# Patient Record
Sex: Female | Born: 1975 | Race: White | Hispanic: No | Marital: Single | Smoking: Former smoker
Health system: Southern US, Community
[De-identification: ages and names within clinical notes are randomized; demographics above are authoritative.]

## PROBLEM LIST (undated history)

## (undated) ENCOUNTER — Emergency Department: Admission: EM | Discharge status: Absent without leave | Payer: Self-pay

## (undated) DIAGNOSIS — G4710 Hypersomnia, unspecified: Secondary | ICD-10-CM

## (undated) DIAGNOSIS — G4730 Sleep apnea, unspecified: Secondary | ICD-10-CM

## (undated) DIAGNOSIS — K580 Irritable bowel syndrome with diarrhea: Secondary | ICD-10-CM

## (undated) DIAGNOSIS — G47419 Narcolepsy without cataplexy: Secondary | ICD-10-CM

## (undated) DIAGNOSIS — E119 Type 2 diabetes mellitus without complications: Secondary | ICD-10-CM

## (undated) DIAGNOSIS — T424X1A Poisoning by benzodiazepines, accidental (unintentional), initial encounter: Secondary | ICD-10-CM

## (undated) DIAGNOSIS — D134 Benign neoplasm of liver: Secondary | ICD-10-CM

## (undated) DIAGNOSIS — K760 Fatty (change of) liver, not elsewhere classified: Secondary | ICD-10-CM

## (undated) DIAGNOSIS — R002 Palpitations: Secondary | ICD-10-CM

## (undated) DIAGNOSIS — R000 Tachycardia, unspecified: Secondary | ICD-10-CM

## (undated) DIAGNOSIS — N3010 Interstitial cystitis (chronic) without hematuria: Secondary | ICD-10-CM

## (undated) DIAGNOSIS — I10 Essential (primary) hypertension: Secondary | ICD-10-CM

## (undated) DIAGNOSIS — R011 Cardiac murmur, unspecified: Secondary | ICD-10-CM

## (undated) DIAGNOSIS — F32A Depression, unspecified: Secondary | ICD-10-CM

## (undated) DIAGNOSIS — F329 Major depressive disorder, single episode, unspecified: Secondary | ICD-10-CM

## (undated) DIAGNOSIS — F419 Anxiety disorder, unspecified: Secondary | ICD-10-CM

## (undated) DIAGNOSIS — G47411 Narcolepsy with cataplexy: Secondary | ICD-10-CM

## (undated) DIAGNOSIS — G43909 Migraine, unspecified, not intractable, without status migrainosus: Secondary | ICD-10-CM

## (undated) DIAGNOSIS — E7800 Pure hypercholesterolemia, unspecified: Secondary | ICD-10-CM

## (undated) HISTORY — DX: Palpitations: R00.2

## (undated) HISTORY — PX: ADENOIDECTOMY: SUR15

## (undated) HISTORY — DX: Type 2 diabetes mellitus without complications: E11.9

## (undated) HISTORY — DX: Benign neoplasm of liver: D13.4

## (undated) HISTORY — DX: Cardiac murmur, unspecified: R01.1

## (undated) HISTORY — PX: BLADDER SURGERY: SHX569

## (undated) HISTORY — DX: Interstitial cystitis (chronic) without hematuria: N30.10

## (undated) HISTORY — DX: Fatty (change of) liver, not elsewhere classified: K76.0

## (undated) HISTORY — DX: Hypersomnia, unspecified: G47.10

## (undated) HISTORY — PX: KNEE SURGERY: SHX244

## (undated) HISTORY — DX: Depression, unspecified: F32.A

## (undated) HISTORY — DX: Narcolepsy without cataplexy: G47.419

## (undated) HISTORY — DX: Poisoning by benzodiazepines, accidental (unintentional), initial encounter: T42.4X1A

## (undated) HISTORY — PX: CHOLECYSTECTOMY: SHX55

## (undated) HISTORY — DX: Major depressive disorder, single episode, unspecified: F32.9

## (undated) HISTORY — DX: Irritable bowel syndrome with diarrhea: K58.0

## (undated) HISTORY — DX: Essential (primary) hypertension: I10

## (undated) HISTORY — DX: Migraine, unspecified, not intractable, without status migrainosus: G43.909

## (undated) HISTORY — DX: Pure hypercholesterolemia, unspecified: E78.00

## (undated) HISTORY — PX: TONSILLECTOMY: SUR1361

## (undated) HISTORY — DX: Tachycardia, unspecified: R00.0

## (undated) HISTORY — DX: Sleep apnea, unspecified: G47.30

## (undated) HISTORY — DX: Narcolepsy with cataplexy: G47.411

## (undated) HISTORY — DX: Anxiety disorder, unspecified: F41.9

---

## 2000-08-04 ENCOUNTER — Encounter: Payer: Self-pay | Admitting: Internal Medicine

## 2000-08-29 ENCOUNTER — Encounter: Payer: Self-pay | Admitting: Internal Medicine

## 2001-05-31 ENCOUNTER — Encounter: Payer: Self-pay | Admitting: Internal Medicine

## 2002-06-26 ENCOUNTER — Ambulatory Visit (HOSPITAL_COMMUNITY): Admission: RE | Admit: 2002-06-26 | Discharge: 2002-06-26 | Payer: Self-pay | Admitting: Obstetrics and Gynecology

## 2002-08-02 ENCOUNTER — Other Ambulatory Visit: Admission: RE | Admit: 2002-08-02 | Discharge: 2002-08-02 | Payer: Self-pay | Admitting: Obstetrics and Gynecology

## 2002-08-06 ENCOUNTER — Encounter: Admission: RE | Admit: 2002-08-06 | Discharge: 2002-08-06 | Payer: Self-pay | Admitting: Obstetrics and Gynecology

## 2002-08-06 ENCOUNTER — Encounter: Payer: Self-pay | Admitting: Obstetrics and Gynecology

## 2002-12-28 ENCOUNTER — Other Ambulatory Visit: Admission: RE | Admit: 2002-12-28 | Discharge: 2002-12-28 | Payer: Self-pay | Admitting: Obstetrics and Gynecology

## 2003-08-09 ENCOUNTER — Other Ambulatory Visit: Admission: RE | Admit: 2003-08-09 | Discharge: 2003-08-09 | Payer: Self-pay | Admitting: Obstetrics and Gynecology

## 2003-11-15 ENCOUNTER — Other Ambulatory Visit: Admission: RE | Admit: 2003-11-15 | Discharge: 2003-11-15 | Payer: Self-pay | Admitting: Obstetrics and Gynecology

## 2004-01-10 ENCOUNTER — Ambulatory Visit (HOSPITAL_BASED_OUTPATIENT_CLINIC_OR_DEPARTMENT_OTHER): Admission: RE | Admit: 2004-01-10 | Discharge: 2004-01-10 | Payer: Self-pay | Admitting: Urology

## 2004-01-10 ENCOUNTER — Encounter (INDEPENDENT_AMBULATORY_CARE_PROVIDER_SITE_OTHER): Payer: Self-pay | Admitting: Specialist

## 2004-01-10 ENCOUNTER — Ambulatory Visit (HOSPITAL_COMMUNITY): Admission: RE | Admit: 2004-01-10 | Discharge: 2004-01-10 | Payer: Self-pay | Admitting: Urology

## 2004-05-15 ENCOUNTER — Encounter: Payer: Self-pay | Admitting: Internal Medicine

## 2004-06-13 ENCOUNTER — Encounter: Payer: Self-pay | Admitting: Internal Medicine

## 2004-07-03 ENCOUNTER — Encounter: Payer: Self-pay | Admitting: Internal Medicine

## 2004-07-17 ENCOUNTER — Encounter: Admission: RE | Admit: 2004-07-17 | Discharge: 2004-07-17 | Payer: Self-pay | Admitting: *Deleted

## 2004-08-13 ENCOUNTER — Other Ambulatory Visit: Admission: RE | Admit: 2004-08-13 | Discharge: 2004-08-13 | Payer: Self-pay | Admitting: *Deleted

## 2005-02-08 ENCOUNTER — Ambulatory Visit: Payer: Self-pay | Admitting: General Surgery

## 2005-08-25 ENCOUNTER — Encounter: Payer: Self-pay | Admitting: *Deleted

## 2005-11-15 ENCOUNTER — Other Ambulatory Visit: Admission: RE | Admit: 2005-11-15 | Discharge: 2005-11-15 | Payer: Self-pay | Admitting: Obstetrics & Gynecology

## 2006-11-21 ENCOUNTER — Other Ambulatory Visit: Admission: RE | Admit: 2006-11-21 | Discharge: 2006-11-21 | Payer: Self-pay | Admitting: Obstetrics & Gynecology

## 2008-10-24 ENCOUNTER — Encounter: Admission: RE | Admit: 2008-10-24 | Discharge: 2008-10-24 | Payer: Self-pay | Admitting: Obstetrics and Gynecology

## 2008-12-02 ENCOUNTER — Encounter: Payer: Self-pay | Admitting: Nurse Practitioner

## 2008-12-02 ENCOUNTER — Inpatient Hospital Stay (HOSPITAL_COMMUNITY): Admission: RE | Admit: 2008-12-02 | Discharge: 2008-12-11 | Payer: Self-pay | Admitting: Internal Medicine

## 2008-12-04 ENCOUNTER — Encounter (HOSPITAL_BASED_OUTPATIENT_CLINIC_OR_DEPARTMENT_OTHER): Payer: Self-pay | Admitting: Internal Medicine

## 2008-12-06 ENCOUNTER — Ambulatory Visit: Payer: Self-pay | Admitting: Internal Medicine

## 2008-12-09 ENCOUNTER — Encounter (INDEPENDENT_AMBULATORY_CARE_PROVIDER_SITE_OTHER): Payer: Self-pay | Admitting: Interventional Radiology

## 2008-12-16 ENCOUNTER — Encounter: Admission: RE | Admit: 2008-12-16 | Discharge: 2009-03-16 | Payer: Self-pay | Admitting: Internal Medicine

## 2008-12-17 ENCOUNTER — Encounter: Payer: Self-pay | Admitting: Nurse Practitioner

## 2009-01-01 ENCOUNTER — Encounter: Payer: Self-pay | Admitting: Nurse Practitioner

## 2009-01-17 ENCOUNTER — Encounter: Payer: Self-pay | Admitting: Nurse Practitioner

## 2009-01-17 ENCOUNTER — Telehealth: Payer: Self-pay | Admitting: Gastroenterology

## 2009-01-21 ENCOUNTER — Ambulatory Visit: Payer: Self-pay | Admitting: Gastroenterology

## 2009-01-21 DIAGNOSIS — K219 Gastro-esophageal reflux disease without esophagitis: Secondary | ICD-10-CM | POA: Insufficient documentation

## 2009-01-21 DIAGNOSIS — Z8719 Personal history of other diseases of the digestive system: Secondary | ICD-10-CM | POA: Insufficient documentation

## 2009-01-21 DIAGNOSIS — R197 Diarrhea, unspecified: Secondary | ICD-10-CM | POA: Insufficient documentation

## 2009-02-19 ENCOUNTER — Ambulatory Visit: Payer: Self-pay | Admitting: Internal Medicine

## 2009-03-24 ENCOUNTER — Ambulatory Visit (HOSPITAL_COMMUNITY): Admission: RE | Admit: 2009-03-24 | Discharge: 2009-03-24 | Payer: Self-pay | Admitting: Internal Medicine

## 2010-10-08 ENCOUNTER — Encounter: Payer: Self-pay | Admitting: Nurse Practitioner

## 2010-10-27 ENCOUNTER — Encounter: Payer: Self-pay | Admitting: Nurse Practitioner

## 2010-11-03 NOTE — Letter (Signed)
Summary: phone msgs 3/1-4/16/[facility]  phone msgs 3/1-4/16/[facility]   Imported By: Lester [employer] 03/11/2009 07:54:12  _____________________________________________________________________  External Attachment:    Type:   Image     Comment:   External Document

## 2010-11-03 NOTE — Procedures (Signed)
Summary: [facility]oscopy/[facility]  [facility]oscopy/[facility]   Imported By: Lester [place] 02/28/2009 10:29:29  _____________________________________________________________________  External Attachment:    Type:   Image     Comment:   External Document

## 2010-11-03 NOTE — Assessment & Plan Note (Signed)
Summary: Diarrea for ver a month/lk   History of Present Illness Visit Type: Initial Consult Primary GI MD: Yancey Flemings MD Primary Provider: Buren Kos, MD Chief Complaint: diarrhea History of Present Illness:   Christine Cook was seen by [place] for elevated LFTs / abnormal CTscan in the hospital. She is here today with numerous medical complaints (see ROS).   Acute on Chronic Diarrhea:  Hospitalized early March for ten days with elevated WBC, diarrhea, fever, dehydration, UTI. According to admitting H&P patient had been on multiple antibiotics for rhinitis when her chronic diarrhea worsened. Stool for c-diff x3 negative in hospital as were stools for giardia, stool culture and stool lactoferrin. TTG negative. Patient took 10 days of Flagyl in hospital which didn't really help. Still having 4-6 loose BMs a day. BMs associated with LLQ cramps, patient not sure if they are resolved with defecation. Takes Lomotil if she exceeds more than 3-4 loose stools a day. Occasional nocturnal diarrhea. Small, low-fat meals are better tolerated. Had a colonoscopy for evaluation of pain in [place] approx 4 years ago. Though colonoscopy wasn't for diarrhea patient tells me colon biopsies were done and "okay". No history of hematochezia. Patient has gained 10 pounds since hospital discharge. TSH in hospital was normal.  GERD: Manifested as chest pain and for most part controlled with PPI.  GI in [place] did EGD few years ago for heartburn. Patient doesn't think she had a stricture but esophagus was dilated. EGD report not here for review at this time.       GI Review of Systems    Reports abdominal pain and  weight gain.     Location of  Abdominal pain: LLQ.    Denies chest pain.      Reports diarrhea, irritable bowel syndrome, and  liver problems.     Preventive Screening-Counseling & Management     Smoking Status: quit      Drug Use:  no.      Current Medications (verified):  1)  Effexor Xr 75 Mg Xr24h-Cap (Venlafaxine Hcl) .... 3 Tablets By Mouth Once Daily 2)  Protonix 40 Mg Tbec (Pantoprazole Sodium) .Marland Kitchen.. 1 Tablet By Mouth Once Daily 3)  Lomotil 2.5-0.025 Mg Tabs (Diphenoxylate-Atropine) .... Take By Mouth As Needed Diarrhea 4)  Atenolol 50 Mg Tabs (Atenolol) .Marland Kitchen.. 1 Tablet By Mouth Once Daily 5)  Benadryl Dye-Free Allergy 25 Mg Caps (Diphenhydramine Hcl) .Marland Kitchen.. 1 Tablet By Mouth Once Daily As Needed 6)  Advil 200 Mg Tabs (Ibuprofen) .... Take By Mouth As Needed  Allergies (verified): 1)  ! Morphine 2)  ! Codeine  Past History:  Past Medical History:    Current Problems:     SLEEP APNEA (ICD-780.57)    OBESITY (ICD-278.00)    IBS (ICD-564.1)    INTERSTITIAL CYSTITIS (ICD-595.1)    HYPERCHOLESTEROLEMIA (ICD-272.0)    GERD (ICD-530.81)    CHOLELITHIASIS, HX OF (ICD-V12.79)    DM (ICD-250.00)    DEPRESSION (ICD-311)    HEADACHE, CHRONIC (ICD-784.0)    ANXIETY (ICD-300.00)    ?FOCAL NODULAR HYPERPLASIA (LIVER BX. MARCH 2010)  Past Surgical History:    lt. knee    T&A    Cholecystectomy  Family History:    Limited FMH. Mother adopted. Doesn't know father.    No FH of Colon Cancer:    No history of Crohn's or Ulcerative Colitis  Social History:    Occupation: Admin Support    Patient is a former smoker.     Alcohol Use - yes  Alcohol Use - no    Illicit Drug Use - no    Smoking Status:  quit    Drug Use:  no  Review of Systems        The patient complains of allergy/sinus, breast changes/lumps, fatigue, fever, headaches-new, heart rhythm changes, menstrual pain, night sweats, nosebleeds, and sore throat.  The patient denies anemia, anxiety-new, arthritis/joint pain, back pain, blood in urine, change in vision, confusion, cough, coughing up blood, depression-new, fainting, hearing problems, heart murmur, itching, muscle pains/cramps, pregnancy symptoms, shortness of breath, skin rash, sleeping problems, swelling of feet/legs, swollen lymph glands, thirst - excessive , urination - excessive , urination changes/pain, urine leakage, vision changes, and voice change.    Vital Signs:  Patient profile:   35 year old female Height:      65 inches Weight:      189.25 pounds BMI:     31.61 Pulse rate:   88 / minute Pulse rhythm:   regular BP sitting:   128 / 86  (left arm)  Vitals Entered By: Milford Cage CMA (January 21, 2009 9:50 AM)  Physical Exam  General:  Well developed, well nourished, no acute distress. Head:  Normocephalic and atraumatic. Eyes:  Conjunctiva pink, no icterus.  Mouth:  No oral lesions. Tongue moist.  Neck:  Supple; no masses. Lungs:  Clear throughout to auscultation. Heart:  Regular rate and rhythm; no murmurs, rubs,  or bruits. Abdomen:  Abdomen soft, obese, nontender, nondistended. No obvious masses or hepatomegaly. No obvious hernias. Normal bowel sounds.  Msk:  Symmetrical with no gross deformities. Normal posture. Extremities:  No palmar erythema, no edema.  Neurologic:  Alert and  oriented x4;  grossly normal neurologically. Skin:  Intact without significant lesions or rashes. Cervical Nodes:  No significant cervical adenopathy. Psych:  Alert and cooperative. Flat affect.   Impression & Recommendations:  Problem # 1:  DIARRHEA (ICD-787.91) Assessment Deteriorated  Associated with some lower abdominal cramps, ? relieved with defecation. Told in past had IBS but patient not convinced. She has chronic loose stools, progressive over last several weeks. Recent stool studies, TTG negative. No response to Flagyl. See HPI. Need to obtain colonoscopy and EGD reports from Dr. Tressie Ellis in [place]. For now take Lomotil twice daily instead of taking on an as needed basis. High fiber diet. Cannot take bowel antispasmotics as it worsens her interstitial cystitis. Patient will follow up with Dr. Marina Goodell in 4-6 weeks.  Problem # 2:  IBS (ICD-564.1) Assessment: Unchanged See #1. Diarrhea likely functional in nature. If our workup turns out negative and patient doesn't respond to fiber, Lomotil and other IBS measures, we may look at trying Lotronex in the near future.  Problem # 3:  GERD (ICD-530.81) Assessment: Unchanged Continue PPI. Await EGD report from Dr. Tressie Ellis.  Problem # 4:  INTERSTITIAL CYSTITIS (ICD-595.1) Assessment: Comment Only Used to take Elmiron, self cath, etc.. but apparently measures were not effective.  Problem # 5:  DEPRESSION (ICD-311) Assessment: Comment Only On Effexor.  Problem # 6:  TRANSAMINASES, SERUM, ELEVATED (ICD-790.4) May be secondary to focal nodular hyperplasia but more likely related to CTscan findings of fatty liver disease. We did discuss managment of fatty liver disease including weight loss and blood sugar control. Further workup per Dr. Marina Goodell.  Problem # 7:  DM (ICD-250.00) Assessment: Comment Only Suppose to be starting oral agent soon. Improving glycemic control should help with steatosis.  Patient Instructions: 1)  High Fiber, Low Fat  Healthy Eating Plan brochure  given.  2)  We are giving you a written signed prescription for you to take to your pharmacy. 3)  Change your protonix dosage to take it 30 min prior to breakfast. 4)  We have made you an appointment to see Dr Yancey Flemings in the office on 02-19-09 at 8:30 AM.  5)  Copy sent to : Buren Kos, MD 6)  The medication list was reviewed and reconciled.  All changed / newly prescribed medications were explained.  A complete medication list was provided to the patient / caregiver. Prescriptions: LOMOTIL 2.5-0.025 MG TABS (DIPHENOXYLATE-ATROPINE) take by mouth as needed diarrhea  #60 x 1   Entered by:   Lowry Ram CMA   Authorized by:   Willette Cluster NP   Signed by:   Lowry Ram CMA on 01/21/2009   Method used:   Printed then faxed to ...         RxID:   [id number]

## 2010-11-03 NOTE — Letter (Signed)
Summary: [facility]   Imported By: Lester [place] 02/28/2009 09:50:31  _____________________________________________________________________  External Attachment:    Type:   Image     Comment:   External Document

## 2010-11-03 NOTE — Letter (Signed)
Summary: [facility]  [facility]   Imported By: Lester [place] 03/11/2009 08:01:43  _____________________________________________________________________  External Attachment:    Type:   Image     Comment:   External Document

## 2010-11-03 NOTE — Procedures (Signed)
Summary: [facility]  [facility]   Imported By: Lester [place] 02/28/2009 10:19:48  _____________________________________________________________________  External Attachment:    Type:   Image     Comment:   External Document

## 2010-11-03 NOTE — Procedures (Signed)
Summary: [facility]   Imported By: Lester [place] 02/28/2009 10:06:13  _____________________________________________________________________  External Attachment:    Type:   Image     Comment:   External Document

## 2010-11-13 ENCOUNTER — Telehealth: Payer: Self-pay | Admitting: Internal Medicine

## 2010-11-17 ENCOUNTER — Encounter: Payer: Self-pay | Admitting: Nurse Practitioner

## 2010-11-17 ENCOUNTER — Ambulatory Visit (INDEPENDENT_AMBULATORY_CARE_PROVIDER_SITE_OTHER): Payer: BC Managed Care – PPO | Admitting: Nurse Practitioner

## 2010-11-17 DIAGNOSIS — K625 Hemorrhage of anus and rectum: Secondary | ICD-10-CM

## 2010-11-17 DIAGNOSIS — K648 Other hemorrhoids: Secondary | ICD-10-CM

## 2010-11-17 DIAGNOSIS — K589 Irritable bowel syndrome without diarrhea: Secondary | ICD-10-CM

## 2010-11-17 DIAGNOSIS — K644 Residual hemorrhoidal skin tags: Secondary | ICD-10-CM

## 2010-11-19 NOTE — Progress Notes (Signed)
Summary: Triage:  Constipation and Hemorrhoids  Phone Note Call from Patient Call back at Home Phone [phone]   Caller: Patient Call For: Dr. Marina Goodell Reason for Call: Talk to Nurse Summary of Call: Pt needs to see doctor Cadon Raczka for very severe constipation and hemorrohoids Initial call taken by: [place] Johnson,  November 13, 2010 12:18 PM  Follow-up for Phone Call        Spoke with patient and she states she would like to be seen, she is having problems with constipation and hemorrhoids. She has tried some OTC medications for constipation but has had little success. Offered an appointment with Willette Cluster, RNP for 11/17/10 at 2pm. Patient states this appointment will work well for her, she also has an appointment with her GYN that morning because when she does have a bowel movement she is also having some vaginal bleeding. Patient aware of appointment date and time. Follow-up by: Selinda Michaels RN,  November 13, 2010 1:28 PM  Additional Follow-up for Phone Call Additional follow up Details #1::        ok Additional Follow-up by: Hilarie Fredrickson MD,  November 13, 2010 1:38 PM

## 2010-11-25 NOTE — Assessment & Plan Note (Addendum)
Summary: Constipation/Hemorrhoids/No show co-pay/[facility]   Vital Signs:  Patient profile:   35 year old female Height:      65 inches Weight:      165 pounds BMI:     27.56 Pulse rate:   68 / minute Pulse rhythm:   regular BP sitting:   100 / 60  (left arm)  Vitals Entered By: Chales Abrahams CMA Duncan Dull) (November 17, 2010 2:15 PM)  History of Present Illness Visit Type: Follow-up Visit Primary GI MD: Yancey Flemings MD Primary Provider: Buren Kos, MD Chief Complaint: constipation, rectal bleeding, hemorhoids History of Present Illness:   Patient is a 35 year-old female with hyperlipidemia, diabetes, anxiety/depression, interstitial cystitis, irritable bowel syndrome, and GERD.  She has been evaluated previously in [facility] by Dr. Henrene Hawking. She underwent upper endoscopy in 2002 and upper endoscopy as well as colonoscopy in 2005. No significant pathology found. Upper GI biopsies revealed changes consistent with reflux. The colon revealed a nonspecific focal active colitis in the cecum was otherwise normal including the ileum.   Patient comes in today with a  one month history of consitpation associated with rectal bleeding and rectal discomfort when sitting. She gives a history of a "nervous stomach" and gets diarrhea depending on emotions and diet. Takes Lomotil as needed. A month or so ago patient began having problems with constipation. PCP obtained abdominal films ( I am awaiting results). When the problem initially started patient successfully purged her bowels with Miralax, 1/2 bottle of Mg Citrate and stool softeners. She had several BMs. She was advised to take daily stool softeners and 1 capful of Miralax. Her stools are now loose but small in volume. She feels inadequately evacuated after defecation. Her only medication changes have been discontinuation of Byetta and initiation of Phentermine three months ago for weight loss. She is losing weight.  Patient saw GYN today. There is  a question of a gynecological infection, workup is in progress. She is also being worked up for endometriosis.   GI Review of Systems      Denies abdominal pain, acid reflux, belching, bloating, chest pain, dysphagia with liquids, dysphagia with solids, heartburn, loss of appetite, nausea, vomiting, vomiting blood, weight loss, and  weight gain.      Reports constipation, hemorrhoids, rectal bleeding, and  rectal pain.     Denies anal fissure, black tarry stools, change in bowel habit, diarrhea, diverticulosis, fecal incontinence, heme positive stool, irritable bowel syndrome, jaundice, light color stool, and  liver problems.  Current Medications (verified): 1)  Effexor Xr 75 Mg Xr24h-Cap (Venlafaxine Hcl) .... 3 Tablets By Mouth Once Daily 2)  Protonix 40 Mg Tbec (Pantoprazole Sodium) .Marland Kitchen.. 1 Tablet By Mouth Once Daily 3)  Lomotil 2.5-0.025 Mg Tabs (Diphenoxylate-Atropine) .... Take By Mouth As Needed Diarrhea 4)  Atenolol 50 Mg Tabs (Atenolol) .Marland Kitchen.. 1 Tablet By Mouth Once Daily 5)  Benadryl Dye-Free Allergy 25 Mg Caps (Diphenhydramine Hcl) .Marland Kitchen.. 1 Tablet By Mouth Once Daily As Needed 6)  Advil 200 Mg Tabs (Ibuprofen) .... Take By Mouth As Needed 7)  Topamax 100 Mg Tabs (Topiramate) .... One Tablet By Mouth Once Daily At Bedtime 8)  Lipitor 40 Mg Tabs (Atorvastatin Calcium) .Marland Kitchen.. 1 By Mouth At Bedtime 9)  Phentermine Hcl 37.5 Mg Caps (Phentermine Hcl) .Marland Kitchen.. 1 By Mouth Once Daily 10)  Stool Softener 100 Mg Caps (Docusate Sodium) .... 2 By Mouth Once Daily 11)  Miralax  Powd (Polyethylene Glycol 3350) .... As Directed 12)  Proctosol Hc  2.5 % Crea (Hydrocortisone) .... As Directed  Allergies (verified): 1)  ! Morphine 2)  ! Codeine  Past History:  Past Medical History: Reviewed history from 01/21/2009 and no changes required. Current Problems:  SLEEP APNEA (ICD-780.57) OBESITY (ICD-278.00) IBS (ICD-564.1) INTERSTITIAL CYSTITIS (ICD-595.1) HYPERCHOLESTEROLEMIA (ICD-272.0) GERD  (ICD-530.81) CHOLELITHIASIS, HX OF (ICD-V12.79) DM (ICD-250.00) DEPRESSION (ICD-311) HEADACHE, CHRONIC (ICD-784.0) ANXIETY (ICD-300.00) ?FOCAL NODULAR HYPERPLASIA (LIVER BX. MARCH 2010)  Past Surgical History: Reviewed history from 01/21/2009 and no changes required. lt. knee T&A Cholecystectomy  Family History: Reviewed history from 01/21/2009 and no changes required. Limited FMH. Mother adopted. Doesn't know father. No FH of Colon Cancer: No history of Crohn's or Ulcerative Colitis  Social History: Reviewed history from 02/19/2009 and no changes required. 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  Alcohol Use - no Illicit Drug Use - no Daily Caffeine Use: cup a day  Physical Exam  General:  Well developed, well nourished, no acute distress. Head:  Normocephalic and atraumatic. Eyes:  Conjunctiva pink, no icterus.  Neck:  no obvious masses  Lungs:  Clear throughout to auscultation. Heart:  Regular rate and rhythm; no murmurs, rubs,  or bruits. Abdomen:  Abdomen soft, nontender, nondistended. No obvious masses or hepatomegaly.Normal bowel sounds.  Rectal:  Small, mildly inflamed hemorrhoid. Limited anoscopic examination (painful) reveals a few mildly inflamed internal hemorrhoids. Msk:  Symmetrical with no gross deformities. Normal posture. Extremities:  No palmar erythema, no edema.  Neurologic:  Alert and  oriented x4;  grossly normal neurologically. Cervical Nodes:  No significant cervical adenopathy. Psych:  Alert and cooperative. Normal mood and affect.   Impression & Recommendations:  Problem # 1:  IBS (ICD-564.1) Assessment Deteriorated She has a tendancy toward loose stool but now with a one month history of constipation. Her stools are loose on stool softeners and Miralax which she takes a few times a week but stools low volume leading to sensation of incomplete evacuation. She is not impacted on exam so doubt overflow. Her recent bowel change  is difficult to sort. Overall, at present, she seems to be constipated. Patient admits to inadequate fluid intake, she has had recent medication changes and underlying IBS. We discussed daily fiber supplements which she agrees to begin. If truely constipated, fiber may help. She understands that fiber could worsen the problem as well. She should continue Miralax but try smaller dose (1/2 capful daily). Patient understands that Phentermine can cause loose stool.     Problem # 2:  RECTAL BLEEDING (ICD-569.3) Assessment: New Likely source of bleeding with defecation. Trial of steroid suppositories, avoid straining. She has a painful external hemorrhoid for which she can try Xylocaine jelly. If bleeding persists she may need repeat lower endoscopy (last one approximately 6 years ago).  Problem # 3:  HEMORRHOIDS-INTERNAL (ICD-455.0) Assessment: Comment Only See #2.  Patient Instructions: 1)  We have given you samples of fiber metamucil, take this in water or juice daily. 2)  We  sent perscriptions for suppositories to use at bedtime and Xylocaine jelly to use on the rectal hemorrhoids. 3)  Avoid straining with bowel movements. 4)  Take Miralax 1/2 capful daily  in glass of water daily until you feel your bowels have moved adequately. 5)  Copy sent to :  Dr. Buren Kos 6)  The medication list was reviewed and reconciled.  All changed / newly prescribed medications were explained.  A complete medication list was provided to the patient / caregiver. Prescriptions: XYLOCAINE JELLY 2 % GEL (LIDOCAINE  HCL) Use small amt at the rectal area for hemorrhoids  #30 cc x 0   Entered by:   Lowry Ram NCMA   Authorized by:   Willette Cluster NP   Signed by:   Lowry Ram NCMA on 11/17/2010   Method used:   Electronically to        [facility] [phone]* (retail)       [address] [place], [place]  [address]       Ph: [phone] or [phone]       Fax: [phone]   RxID:    [id number] HYDROCORTISONE ACETATE 25 MG SUPP (HYDROCORTISONE ACETATE) Use 1 suppository at bedtime x 7 days  #10 x 1   Entered by:   Lowry Ram NCMA   Authorized by:   Willette Cluster NP   Signed by:   Lowry Ram NCMA on 11/17/2010   Method used:   Electronically to        [facility] [phone]* (retail)       [address] [place], [place]  [address]       Ph: [phone] or [phone]       Fax: [phone]   RxID:   [id number]

## 2010-11-25 NOTE — Letter (Signed)
Summary: Out of Work  [facility]  [address] [place], [place] [address]   Phone: [phone]  Fax: [phone]    November 17, 2010   Employee:  RUTHENE METHVIN [facility]    To Whom It May Concern:   For Medical reasons, please excuse the above named employee from work for the following dates:  Start:   11-17-2010  End:   11-17-2010  If you need additional information, please feel free to contact our office.         Sincerely,       Willette Cluster ACNP

## 2010-12-01 NOTE — Letter (Signed)
Summary: Donnie Mesa MD/[facility]  Donnie Mesa MD/[facility]   Imported By: Lester [place] 11/26/2010 09:21:03  _____________________________________________________________________  External Attachment:    Type:   Image     Comment:   External Document

## 2011-01-11 LAB — BUN: BUN: 6 mg/dL (ref 6–23)

## 2011-01-11 LAB — CREATININE, SERUM
Creatinine, Ser: 0.9 mg/dL (ref 0.4–1.2)
GFR calc Af Amer: >60 mL/min (ref >60)
GFR calc non Af Amer: >60 mL/min (ref >60)

## 2011-01-14 LAB — BASIC METABOLIC PANEL
BUN: 11 mg/dL (ref 6–23)
BUN: 12 mg/dL (ref 6–23)
BUN: 8 mg/dL (ref 6–23)
CO2: 22 mEq/L (ref 19–32)
CO2: 22 mEq/L (ref 19–32)
CO2: 23 mEq/L (ref 19–32)
Calcium: 9 mg/dL (ref 8.4–10.5)
Calcium: 9 mg/dL (ref 8.4–10.5)
Calcium: 9.1 mg/dL (ref 8.4–10.5)
Chloride: 102 mEq/L (ref 96–112)
Chloride: 105 mEq/L (ref 96–112)
Chloride: 106 mEq/L (ref 96–112)
Creatinine, Ser: 0.77 mg/dL (ref 0.4–1.2)
Creatinine, Ser: 0.78 mg/dL (ref 0.4–1.2)
Creatinine, Ser: 0.9 mg/dL (ref 0.4–1.2)
GFR calc Af Amer: >60 mL/min (ref >60)
GFR calc Af Amer: >60 mL/min (ref >60)
GFR calc Af Amer: >60 mL/min (ref >60)
GFR calc non Af Amer: >60 mL/min (ref >60)
GFR calc non Af Amer: >60 mL/min (ref >60)
GFR calc non Af Amer: >60 mL/min (ref >60)
Glucose, Bld: 119 mg/dL — ABNORMAL HIGH (ref 70–99)
Glucose, Bld: 121 mg/dL — ABNORMAL HIGH (ref 70–99)
Glucose, Bld: 124 mg/dL — ABNORMAL HIGH (ref 70–99)
Potassium: 4.2 mEq/L (ref 3.5–5.1)
Potassium: 4.3 mEq/L (ref 3.5–5.1)
Potassium: 4.7 mEq/L (ref 3.5–5.1)
Sodium: 134 mEq/L — ABNORMAL LOW (ref 135–145)
Sodium: 138 mEq/L (ref 135–145)
Sodium: 139 mEq/L (ref 135–145)

## 2011-01-14 LAB — COMPREHENSIVE METABOLIC PANEL
ALT: 31 U/L (ref 0–35)
ALT: 69 U/L — ABNORMAL HIGH (ref 0–35)
ALT: 71 U/L — ABNORMAL HIGH (ref 0–35)
AST: 31 U/L (ref 0–37)
AST: 60 U/L — ABNORMAL HIGH (ref 0–37)
AST: 68 U/L — ABNORMAL HIGH (ref 0–37)
Albumin: 3.2 g/dL — ABNORMAL LOW (ref 3.5–5.2)
Albumin: 3.4 g/dL — ABNORMAL LOW (ref 3.5–5.2)
Albumin: 3.8 g/dL (ref 3.5–5.2)
Alkaline Phosphatase: 48 U/L (ref 39–117)
Alkaline Phosphatase: 51 U/L (ref 39–117)
Alkaline Phosphatase: 69 U/L (ref 39–117)
BUN: 12 mg/dL (ref 6–23)
BUN: 4 mg/dL — ABNORMAL LOW (ref 6–23)
BUN: <1 mg/dL — ABNORMAL LOW (ref 6–23)
CO2: 23 mEq/L (ref 19–32)
CO2: 24 mEq/L (ref 19–32)
CO2: 25 mEq/L (ref 19–32)
Calcium: 8.8 mg/dL (ref 8.4–10.5)
Calcium: 8.9 mg/dL (ref 8.4–10.5)
Calcium: 9.4 mg/dL (ref 8.4–10.5)
Chloride: 102 mEq/L (ref 96–112)
Chloride: 107 mEq/L (ref 96–112)
Chloride: 108 mEq/L (ref 96–112)
Creatinine, Ser: 0.79 mg/dL (ref 0.4–1.2)
Creatinine, Ser: 0.86 mg/dL (ref 0.4–1.2)
Creatinine, Ser: 0.94 mg/dL (ref 0.4–1.2)
GFR calc Af Amer: >60 mL/min (ref >60)
GFR calc Af Amer: >60 mL/min (ref >60)
GFR calc Af Amer: >60 mL/min (ref >60)
GFR calc non Af Amer: >60 mL/min (ref >60)
GFR calc non Af Amer: >60 mL/min (ref >60)
GFR calc non Af Amer: >60 mL/min (ref >60)
Glucose, Bld: 102 mg/dL — ABNORMAL HIGH (ref 70–99)
Glucose, Bld: 107 mg/dL — ABNORMAL HIGH (ref 70–99)
Glucose, Bld: 89 mg/dL (ref 70–99)
Potassium: 3.9 mEq/L (ref 3.5–5.1)
Potassium: 4.2 mEq/L (ref 3.5–5.1)
Potassium: 4.2 mEq/L (ref 3.5–5.1)
Sodium: 139 mEq/L (ref 135–145)
Sodium: 139 mEq/L (ref 135–145)
Sodium: 140 mEq/L (ref 135–145)
Total Bilirubin: 0.6 mg/dL (ref 0.3–1.2)
Total Bilirubin: 0.6 mg/dL (ref 0.3–1.2)
Total Bilirubin: 0.6 mg/dL (ref 0.3–1.2)
Total Protein: 6 g/dL (ref 6.0–8.3)
Total Protein: 6.3 g/dL (ref 6.0–8.3)
Total Protein: 7.1 g/dL (ref 6.0–8.3)

## 2011-01-14 LAB — DIFFERENTIAL
Basophils Absolute: 0 10*3/uL (ref 0.0–0.1)
Basophils Relative: 0 % (ref 0–1)
Eosinophils Absolute: 0.2 10*3/uL (ref 0.0–0.7)
Eosinophils Relative: 2 % (ref 0–5)
Lymphocytes Relative: 30 % (ref 12–46)
Lymphs Abs: 3.4 10*3/uL (ref 0.7–4.0)
Monocytes Absolute: 0.9 10*3/uL (ref 0.1–1.0)
Monocytes Relative: 8 % (ref 3–12)
Neutro Abs: 6.9 10*3/uL (ref 1.7–7.7)
Neutrophils Relative %: 61 % (ref 43–77)

## 2011-01-14 LAB — CBC
HCT: 35.3 % — ABNORMAL LOW (ref 36.0–46.0)
HCT: 37.4 % (ref 36.0–46.0)
HCT: 38.4 % (ref 36.0–46.0)
HCT: 38.6 % (ref 36.0–46.0)
HCT: 39.2 % (ref 36.0–46.0)
HCT: 39.2 % (ref 36.0–46.0)
HCT: 46 % (ref 36.0–46.0)
Hemoglobin: 12.2 g/dL (ref 12.0–15.0)
Hemoglobin: 13.1 g/dL (ref 12.0–15.0)
Hemoglobin: 13.2 g/dL (ref 12.0–15.0)
Hemoglobin: 13.3 g/dL (ref 12.0–15.0)
Hemoglobin: 13.4 g/dL (ref 12.0–15.0)
Hemoglobin: 13.5 g/dL (ref 12.0–15.0)
Hemoglobin: 15.7 g/dL — ABNORMAL HIGH (ref 12.0–15.0)
MCHC: 34.2 g/dL (ref 30.0–36.0)
MCHC: 34.2 g/dL (ref 30.0–36.0)
MCHC: 34.2 g/dL (ref 30.0–36.0)
MCHC: 34.6 g/dL (ref 30.0–36.0)
MCHC: 34.6 g/dL (ref 30.0–36.0)
MCHC: 34.7 g/dL (ref 30.0–36.0)
MCHC: 35 g/dL (ref 30.0–36.0)
MCV: 84.3 fL (ref 78.0–100.0)
MCV: 85.3 fL (ref 78.0–100.0)
MCV: 85.4 fL (ref 78.0–100.0)
MCV: 85.5 fL (ref 78.0–100.0)
MCV: 85.8 fL (ref 78.0–100.0)
MCV: 85.9 fL (ref 78.0–100.0)
MCV: 86.6 fL (ref 78.0–100.0)
Platelets: 222 10*3/uL (ref 150–400)
Platelets: 227 10*3/uL (ref 150–400)
Platelets: 227 10*3/uL (ref 150–400)
Platelets: 230 10*3/uL (ref 150–400)
Platelets: 235 10*3/uL (ref 150–400)
Platelets: 240 10*3/uL (ref 150–400)
Platelets: 283 10*3/uL (ref 150–400)
RBC: 4.11 MIL/uL (ref 3.87–5.11)
RBC: 4.32 MIL/uL (ref 3.87–5.11)
RBC: 4.49 MIL/uL (ref 3.87–5.11)
RBC: 4.52 MIL/uL (ref 3.87–5.11)
RBC: 4.57 MIL/uL (ref 3.87–5.11)
RBC: 4.65 MIL/uL (ref 3.87–5.11)
RBC: 5.39 MIL/uL — ABNORMAL HIGH (ref 3.87–5.11)
RDW: 15.1 % (ref 11.5–15.5)
RDW: 15.2 % (ref 11.5–15.5)
RDW: 15.3 % (ref 11.5–15.5)
RDW: 15.4 % (ref 11.5–15.5)
RDW: 15.5 % (ref 11.5–15.5)
RDW: 15.5 % (ref 11.5–15.5)
RDW: 15.6 % — ABNORMAL HIGH (ref 11.5–15.5)
WBC: 11.3 10*3/uL — ABNORMAL HIGH (ref 4.0–10.5)
WBC: 11.4 10*3/uL — ABNORMAL HIGH (ref 4.0–10.5)
WBC: 11.4 10*3/uL — ABNORMAL HIGH (ref 4.0–10.5)
WBC: 16.7 10*3/uL — ABNORMAL HIGH (ref 4.0–10.5)
WBC: 7.7 10*3/uL (ref 4.0–10.5)
WBC: 8.8 10*3/uL (ref 4.0–10.5)
WBC: 9.2 10*3/uL (ref 4.0–10.5)

## 2011-01-14 LAB — URINE MICROSCOPIC-ADD ON

## 2011-01-14 LAB — TSH: TSH: 2.832 u[IU]/mL (ref 0.350–4.500)

## 2011-01-14 LAB — GLUCOSE, CAPILLARY
Glucose-Capillary: 101 mg/dL — ABNORMAL HIGH (ref 70–99)
Glucose-Capillary: 103 mg/dL — ABNORMAL HIGH (ref 70–99)
Glucose-Capillary: 107 mg/dL — ABNORMAL HIGH (ref 70–99)
Glucose-Capillary: 111 mg/dL — ABNORMAL HIGH (ref 70–99)
Glucose-Capillary: 112 mg/dL — ABNORMAL HIGH (ref 70–99)
Glucose-Capillary: 126 mg/dL — ABNORMAL HIGH (ref 70–99)
Glucose-Capillary: 135 mg/dL — ABNORMAL HIGH (ref 70–99)
Glucose-Capillary: 137 mg/dL — ABNORMAL HIGH (ref 70–99)
Glucose-Capillary: 140 mg/dL — ABNORMAL HIGH (ref 70–99)
Glucose-Capillary: 151 mg/dL — ABNORMAL HIGH (ref 70–99)
Glucose-Capillary: 151 mg/dL — ABNORMAL HIGH (ref 70–99)
Glucose-Capillary: 160 mg/dL — ABNORMAL HIGH (ref 70–99)
Glucose-Capillary: 161 mg/dL — ABNORMAL HIGH (ref 70–99)
Glucose-Capillary: 164 mg/dL — ABNORMAL HIGH (ref 70–99)
Glucose-Capillary: 166 mg/dL — ABNORMAL HIGH (ref 70–99)
Glucose-Capillary: 196 mg/dL — ABNORMAL HIGH (ref 70–99)
Glucose-Capillary: 81 mg/dL (ref 70–99)
Glucose-Capillary: 92 mg/dL (ref 70–99)

## 2011-01-14 LAB — CLOSTRIDIUM DIFFICILE EIA
C difficile Toxins A+B, EIA: NEGATIVE
C difficile Toxins A+B, EIA: NEGATIVE
C difficile Toxins A+B, EIA: NEGATIVE

## 2011-01-14 LAB — CARDIAC PANEL(CRET KIN+CKTOT+MB+TROPI)
CK, MB: 0.9 ng/mL (ref 0.3–4.0)
CK, MB: 1.4 ng/mL (ref 0.3–4.0)
CK, MB: 1.6 ng/mL (ref 0.3–4.0)
Relative Index: INVALID (ref 0.0–2.5)
Relative Index: INVALID (ref 0.0–2.5)
Relative Index: INVALID (ref 0.0–2.5)
Total CK: 25 U/L (ref 7–177)
Total CK: 30 U/L (ref 7–177)
Total CK: 33 U/L (ref 7–177)
Troponin I: <0.01 ng/mL (ref 0.00–0.06)
Troponin I: <0.01 ng/mL (ref 0.00–0.06)
Troponin I: <0.01 ng/mL (ref 0.00–0.06)

## 2011-01-14 LAB — HEMOCCULT GUIAC POC 1CARD (OFFICE)
Fecal Occult Bld: NEGATIVE
Fecal Occult Bld: NEGATIVE
Fecal Occult Bld: POSITIVE
Fecal Occult Bld: POSITIVE
Fecal Occult Bld: POSITIVE

## 2011-01-14 LAB — HEPATIC FUNCTION PANEL
ALT: 50 U/L — ABNORMAL HIGH (ref 0–35)
AST: 53 U/L — ABNORMAL HIGH (ref 0–37)
Albumin: 3.3 g/dL — ABNORMAL LOW (ref 3.5–5.2)
Alkaline Phosphatase: 49 U/L (ref 39–117)
Bilirubin, Direct: 0.1 mg/dL (ref 0.0–0.3)
Indirect Bilirubin: 0.7 mg/dL (ref 0.3–0.9)
Total Bilirubin: 0.8 mg/dL (ref 0.3–1.2)
Total Protein: 6 g/dL (ref 6.0–8.3)

## 2011-01-14 LAB — CMV ABS, IGG+IGM (CYTOMEGALOVIRUS)
CMV IgM: <8 AU/mL (ref <30.0)
Cytomegalovirus Ab-IgG: <0.2 U/mL (ref <0.4)

## 2011-01-14 LAB — STOOL CULTURE

## 2011-01-14 LAB — T4, FREE: Free T4: 1.01 ng/dL (ref 0.89–1.80)

## 2011-01-14 LAB — GIARDIA/CRYPTOSPORIDIUM SCREEN(EIA)
Cryptosporidium Screen (EIA): NEGATIVE
Giardia Screen - EIA: NEGATIVE

## 2011-01-14 LAB — EPSTEIN-BARR VIRUS VCA ANTIBODY PANEL
EBV EA IgG: 0.34 {ISR}
EBV NA IgG: 4.79 {ISR} — ABNORMAL HIGH
EBV VCA IgG: 5.72 {ISR} — ABNORMAL HIGH
EBV VCA IgM: 0.11 {ISR}

## 2011-01-14 LAB — URINE CULTURE: Colony Count: 35000

## 2011-01-14 LAB — URINALYSIS, ROUTINE W REFLEX MICROSCOPIC
Bilirubin Urine: NEGATIVE
Glucose, UA: NEGATIVE mg/dL
Ketones, ur: NEGATIVE mg/dL
Nitrite: NEGATIVE
Protein, ur: NEGATIVE mg/dL
Specific Gravity, Urine: 1.025 (ref 1.005–1.030)
Urobilinogen, UA: 0.2 mg/dL (ref 0.0–1.0)
pH: 5.5 (ref 5.0–8.0)

## 2011-01-14 LAB — SODIUM, URINE, TIMED
Sodium, 24H Ur: 553 mEq/d — ABNORMAL HIGH (ref 40–220)
Sodium, Ur: 140 mEq/L
Urine Total Volume-UNA24: 3950 mL

## 2011-01-14 LAB — FECAL LACTOFERRIN, QUANT: Fecal Lactoferrin: NEGATIVE

## 2011-01-14 LAB — PROTIME-INR
INR: 1 (ref 0.00–1.49)
INR: 1 (ref 0.00–1.49)
Prothrombin Time: 13.2 seconds (ref 11.6–15.2)
Prothrombin Time: 13.2 seconds (ref 11.6–15.2)

## 2011-01-14 LAB — TISSUE TRANSGLUTAMINASE, IGG: Tissue Transglut Ab: 0 U/mL (ref <7)

## 2011-01-14 LAB — HEMOGLOBIN A1C
Hgb A1c MFr Bld: 6.6 % — ABNORMAL HIGH (ref 4.6–6.1)
Mean Plasma Glucose: 143 mg/dL

## 2011-01-14 LAB — AFP TUMOR MARKER: AFP-Tumor Marker: 2.9 ng/mL (ref 0.0–8.0)

## 2011-01-14 LAB — 5 HIAA, QUANTITATIVE, URINE, 24 HOUR
5-HIAA, 24 Hr Urine: 3.2 mg/24 h (ref <=6.0)
Volume, Urine-5HIAA: 2650 mL/24 h

## 2011-01-14 LAB — CORTISOL: Cortisol, Plasma: 16.7 ug/dL

## 2011-01-14 LAB — D-DIMER, QUANTITATIVE: D-Dimer, Quant: <0.22 ug/mL-FEU (ref 0.00–0.48)

## 2011-01-14 LAB — TISSUE TRANSGLUTAMINASE, IGA: Tissue Transglutaminase Ab, IgA: 0 U/mL (ref <7)

## 2011-01-14 LAB — PREGNANCY, URINE: Preg Test, Ur: NEGATIVE

## 2011-01-28 ENCOUNTER — Other Ambulatory Visit: Payer: Self-pay | Admitting: Obstetrics and Gynecology

## 2011-02-16 NOTE — Discharge Summary (Signed)
Christine Cook, Christine Cook               ACCOUNT NO.:  [id number]   MEDICAL RECORD NO.:  [id number]          PATIENT TYPE:  INP   LOCATION:  3711                         FACILITY:  [facility]   PHYSICIAN:  Kari Baars, M.D.  DATE OF BIRTH:  [birth year]   DATE OF ADMISSION:  12/02/2008  DATE OF DISCHARGE:                               DISCHARGE SUMMARY   DISCHARGE DIAGNOSES:  1. Extreme fatigue, likely secondary to viral gastroenteritis.  2. Nausea; vomiting; and diarrhea, likely infectious and likely viral      gastroenteritis.  3. Sinus tachycardia, secondary to nausea; vomiting; and diarrhea,      likely infectious and likely viral gastroenteritis.  4. Urinary tract infection, polymicrobial.  5. Impaired fasting glucose/borderline diabetes (A1c 6.6), diet      controlled.  6. Obstructive sleep apnea.   HOSPITAL PROCEDURES:  Transthoracic echocardiogram (December 04, 2008),  results pending at time of discharge.   DISCHARGE MEDICATIONS:  1. Phenergan 25 mg q.6 h. p.r.n. nausea.  2. Macrobid 100 mg b.i.d. x2 days for UTI.  3. Imodium 1 q.4 h. p.r.n. diarrhea.  4. Lo/Ovral daily.  5. Protonix 40 mg daily.  6. Frova 2.5 mg p.r.n. migraines.  7. Darvocet-N 100 one q.6 h. p.r.n. pain.  8. Atenolol 50 mg daily.  9. Effexor XR 225 mg daily.  10.Migranal p.r.n.   HISTORY OF PRESENT ILLNESS:  For full details, please see dictated  history and physical.  Briefly, Ms. Miron is a 35 year old white female  with a history of migraine headaches, depression, and obesity with  borderline type 2 diabetes who presented to the office on December 02, 2008,  with complaint of extreme fatigue.  She states, she has had several  courses of antibiotics recently prescribed by her allergist for upper  respiratory infection, which included initially azithromycin, then  Avelox.  She completed 2 steroid tapers and was given 2 steroid shots  over the past 3 weeks.  Despite this, she continues to have significant  fatigue, her head congestion symptoms have improved slightly.  She has  also recently developed nausea and diarrhea.  She presented to the  office for evaluation of these symptoms.  She was found to have a heart  rate of 140.  EKG showed sinus tachycardia with nonspecific ST changes.  CBC revealed a white blood count of 16.7 with BUN and creatinine of 12  and 0.9 respectively.  D-dimer was less than 0.22.  Given these  findings, the patient was admitted for further management of extreme  fatigue, diarrhea, and tachycardia.   HOSPITAL COURSE:  The patient was admitted to a telemetry bed.  She  ruled out for myocardial infarction with serial enzymes.  Given her  normal D-dimer, it was felt unlikely that her tachycardia was related to  pulmonary embolism.  It is felt that this was most likely reactive to  her underlying diarrheal illness and dehydration.  She was treated with  IV fluids with improvement in her tachycardia.  However, she continued  to have significant diarrhea of up to 9-10 loose bowel movements per  day.  These  were heme-positive, but not grossly bloody.  C. diff was  obtained and was negative x3.  Stool O and P culture of fat and white  blood cells have been obtained, and are pending at the time of  discharge.  Initially, she was placed on Flagyl on the day prior to the  return of the second and third C. diff toxins.  However, with this she  developed increasing nausea felt to be medication related.  At this  time, given the community outbreak of norovirus is most consistent with  a viral gastroenteritis such as norovirus.  She will be treated  supportively with fluid intake and antidiarrheals.  Her diarrhea seems  to be improving and she is tolerating p.o.'s.  Therefore, she is felt  stable for discharge home with outpatient followup.   For her tachycardia, she was continued on her atenolol.  She was  hydrated with improvement.  An echocardiogram was performed given her   extreme fatigue and obstructive sleep apnea to rule out any significant  right heart failure or pulmonary hypertension.  This is pending at the  time of discharge.   For her fatigue, random cortisol was obtained given her recent steroid  treatment.  This was sufficient at 16.7, making adrenal insufficiency,  unlikely.  CMV and EBV antibodies were also obtained and were consistent  with prior exposure to EBV and negative CMV.  It is felt that her  underlying fatigue is multifactorial related to her acute illness  superimposed on her chronic sleep apnea.  She will need overnight CPAP  titration study with pulse oxymetry when this can be arranged as an  outpatient.   At this time, the patient is stable for discharge home.  Her diarrhea is  improving with antidiarrheal therapy and she is tolerating p.o.'s.  Her  tachycardia is overall improved.  She will follow up in 1 week to follow  up on her echocardiogram reports and stool studies.  If her diarrhea  persists, will need outpatient GI follow up.   DISCHARGE LABORATORIES:  CBC shows a white count of 11.3, which is  improved, hemoglobin 13.4, and platelets 230.  BMET shows sodium of 138,  potassium 4.2, chloride 106, bicarb 22, BUN 11, creatinine 0.9, and  glucose 119.  C. diff is negative x3.  Hemoglobin A1c 6.6.  CMV  antibodies negative.  EBV, hCG positive and IgM negative.  Urine  cultures are multi species TSH 2.83 with a free T4 of 1.01.  Random  cortisol 16.7.  Cardiac enzymes negative x3.  D-dimer of less than 0.22.   DISCHARGE DIET:  Cardiac prudent with Carb-modified as tolerated.   HOSPITAL FOLLOWUP:  She will follow up with Dr. Clelia Croft in 1 week to review  her echocardiogram, stool studies, and further evaluate her diarrhea, if  persistent.   DISPOSITION:  To home.      Kari Baars, M.D.  Electronically Signed     WS/MEDQ  D:  12/05/2008  T:  12/05/2008  Job:  [id number]

## 2011-02-16 NOTE — H&P (Signed)
NAMEMADISEN, LUDVIGSEN               ACCOUNT NO.:  [id number]   MEDICAL RECORD NO.:  [id number]          PATIENT TYPE:  INP   LOCATION:  3711                         FACILITY:  [facility]   PHYSICIAN:  Barry Dienes. Eloise Harman, M.D.DATE OF BIRTH:  Sep 27, 1976   DATE OF ADMISSION:  12/02/2008  DATE OF DISCHARGE:                              HISTORY & PHYSICAL   CHIEF COMPLAINT:  Fatigue and sore throat.   HISTORY OF PRESENT ILLNESS:  The patient is a 35 year old white woman  who complained of a few weeks of rhinitis associated with a sore throat  and low-grade temperatures.  She has seen other physicians and had been  recently treated with Zithromax, then Avelox, then prednisone which was  completed 3 days ago.  She has had subsequent diarrhea with a few bowel  movements daily.  She has not continued to have fever and chills.  She  also complains of palpitations with dry cough.  She denies shortness of  breath or substernal chest pain or dysuria or frequency.  She has  chronic mild anxiety and depression.  She has frequent headaches, but  does not have a significant headache at this time.  She has chronic fair  quality sleep and has a heavy snore with a.m. fatigue.  She had in  August 2009 diagnostic sleep study showing a AHI at 10.5 and she has not  had the therapeutic trial study yet.  She had recently been tried on  several antidepressants and restarted Effexor XR 225 mg daily on  November 04, 2008.   OTHER PAST MEDICAL HISTORY:  Diabetes mellitus, type 2 which is well  controlled; interstitial cystitis; dyslipidemia; common migraine  headaches, exogenous obesity; vitamin D deficiency.  In 2005, she had  right Bell palsy with typical findings on MRI scan of the brain.   MEDICATIONS PRIOR TO ADMISSION:  1. Lo/Ovral birth control pill 1 tablet daily.  2. Protonix 40 mg daily.  3. Frova 2.5 mg daily p.r.n. severe headache.  4. Darvocet-N 100 p.r.n. pain.  5. Atenolol 50 mg daily.  6. Migranal  nasal spray p.r.n. severe headaches.  7. Effexor XR 225 mg every a.m.   ALLERGIES AND MEDICATION INTOLERANCES:  She has had nausea with MORPHINE  and CODEINE and has had some problems with ANESTHETICS.  WELLBUTRIN has  been associated with headaches.   Ineffective drugs for depression have include Celexa, Paxil, and Prozac.   PAST SURGICAL HISTORY:  1. Left knee arthroscopy.  2. In 2003, open diagnostic laparoscopy with YAG laser ablation of      endometrial implants.  3. In 2005, cystoscopy with bladder hydrodistention for interstitial      cystitis.  4. In 2006, laparoscopic cholecystectomy.   SOCIAL HISTORY:  She is single and works in Scientist, physiological  for [employer].  She had minimal tobacco use as a teenager  and quit in February 2009 and has no history of alcohol or drug abuse.  She is adopted and is not certain of her family history.  She has 1  daughter who is 72 years old and alive and  well.   REVIEW OF SYSTEMS:  No recent fever or chills or change in vision.  She  has had mild rhinitis with mild sore throat.  She denies substernal  chest pain or difficulty breathing.  She has had mild diarrhea without  nausea or vomiting and has not had significant arthralgias.  She has  chronic mild anxiety and chronic mild depression without suicidal  ideation.   INITIAL PHYSICAL EXAMINATION:  VITAL SIGNS:  Blood pressure 140/110,  pulse 140 and regular, respirations 22, temperature 98.0, and weight was  188 pounds which was 166 pounds in August 2009.  GENERAL:  She is an overweight white female who looked fatigued.  HEAD, EYES, EARS, NOSE, AND THROAT:  Significant for mild rhinitis and  the oropharynx had a normal appearance.  NECK:  There was no cervical lymphadenopathy.  Neck was supple without  jugular venous distention or carotid bruit.  CHEST:  Clear to auscultation.  HEART:  Rapid regular rhythm without significant murmur or gallop.  ABDOMEN:  Normal  bowel sounds and no hepatosplenomegaly.  There was very  mild right lower quadrant tenderness without rebound.  EXTREMITIES:  Without cyanosis, clubbing, or edema.  NEUROLOGICAL:  She was alert and well oriented.  Cranial nerves II-XII  were normal.  Sensory exam was grossly normal.  She was able to walk  normally.  She had mild tearfulness and anxiety with discussion of need  for hospital admission, but did not have suicidal ideation.   INITIAL LABORATORY STUDIES:  A 12-lead EKG showed sinus tachycardia at a  rate of 145 beats per minute with ST-segment depression and T-wave  inversion in the inferior and lateral leads that was more pronounced  then her previous EKG in November 2008.  She did not have reciprocal ST-  segment abnormalities.  CBC had white blood cell count 16.7 with  hemoglobin 15, hematocrit 46, and platelets 283.  Serum sodium 139,  potassium 4.9, chloride 102, carbon dioxide 24, BUN 12, creatinine 0.86,  and glucose 107.  CK 33, troponin I level was less than 0.01.  D-dimer  was less than 0.22.  A rapid strep test was in our office was normal and  in our office she had 54% granulocytes with a white blood cell count of  15.8.   IMPRESSION AND PLAN:  Fatigue:  Most likely secondary to dehydration  from recent viral illness complicated by diarrhea.  It is possible that  her fatigue is primarily due to recently diagnosed obstructive sleep  apnea.  She has no history of cardiomyopathy, but has a significantly  elevated heart rate with low likelihood of pulmonary embolism given the  normal D-dimer level or myocardial infarction given her age and initial  laboratory studies.  It is possible that her elevated heart rate is due  to the Effexor XR.  Given her recent weight gain, it is unlikely that  her elevated heart rate is from hyperthyroidism.  I plan to check a  stool specimen for C. difficile.  In addition, we will check a TSH  level, although this was normal in March  2009.  We will give her IV  fluids and check another set of cardiac  isoenzymes for the unlikely possibility of myocardial infarction.  We  will also recheck a CBC with white blood cell smear in the a.m.  While  she is in the hospital, we will consider having respiratory therapy try  and a mask for the obstructive sleep apnea.  ______________________________  Barry Dienes Eloise Harman, M.D.     DGP/MEDQ  D:  12/02/2008  T:  12/03/2008  Job:  [id number]   cc:   Kari Baars, M.D.  Rene Kocher, M.D.  Patty Dr. Berneice Gandy
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
## 2011-02-19 NOTE — Op Note (Signed)
NAMECATHE, Christine Cook                         ACCOUNT NO.:  [id number]   MEDICAL RECORD NO.:  [id number]                   PATIENT TYPE:  AMB   LOCATION:  [facility]                                 FACILITY:  [facility]   PHYSICIAN:  Jamison Neighbor, M.D.               DATE OF BIRTH:  1976/04/27   DATE OF PROCEDURE:  01/10/2004  DATE OF DISCHARGE:                                 OPERATIVE REPORT   SERVICE:  Urology.   PREOPERATIVE DIAGNOSES:  Interstitial cystitis.   POSTOPERATIVE DIAGNOSES:  Interstitial cystitis.   PROCEDURE:  Cystoscopy, urethral calibration, hydrodistention of Christine Cook  bladder, Marcaine and Pyridium installation, Marcaine and Kenalog injection,  bladder biopsy with cauterization.   SURGEON:  Jamison Neighbor, M.D.   ANESTHESIA:  General.   COMPLICATIONS:  None.   DRAINS:  None.   BRIEF HISTORY:  This 35 year old female has lower urinary tract symptoms of  urgency, frequency and pain. She is known to have interstitial cystitis  based on previous hydrodistentions done elsewhere. She has also had  confirmatory potassium test. Christine Cook patient in addition is felt to have  endometriosis based on laparoscopy and she is on appropriate hormonal  therapy. Christine Cook patient still has significant issues with chronic pelvic pain.  She has been involved with physical therapy with some improvement but has  requested a repeat distention be performed for further assessment of her  bladder and hopefully to give her some relief of her symptoms. Christine Cook patient  notes that any relief she has is not necessarily going to be long-term and  that ongoing therapy for both IC and endometriosis may be required. Christine Cook  patient understands Christine Cook risks and benefits of Christine Cook procedure and gave full  and informed consent.   DESCRIPTION OF PROCEDURE:  After successful induction of general anesthesia,  Christine Cook patient was placed in Christine Cook dorsal lithotomy position, prepped with  Betadine and draped in Christine Cook usual sterile  fashion.  Careful bimanual  examination revealed no abnormalities of Christine Cook urethra, specifically no signs  of a diverticulum or suburethral mass. There was no cystocele, rectocele or  enterocele detected. Christine Cook uterus was palpably normal.  Christine Cook urethra was  calibrated at 68 [place] with female urethral sounds with no evidence of  stenosis or stricture. Christine Cook cystoscope was inserted, Christine Cook bladder was  carefully inspected and was free of any tumor or stones. Both ureteral  orifices were normal in configuration and location. Christine Cook patient then  underwent hydrodistention at a pressure of 100 cm of water. After five  minutes, Christine Cook bladder was drained, Christine Cook patient was noted to have a good  bladder capacity of about 1000 mL which compares favorably to an average for  IC of 575 and an average normal bladder capacity of 1150.  Christine Cook patient did  have some modest glomerulations.  All of these may be less than had been  seen years ago because Christine Cook patient has had [place]  therapy.  A bladder  biopsy is done and will be sent for mast cell analysis as well as to rule  out carcinoma in situ.  Additional inspection of Christine Cook bladder was performed  to be absolutely sure there was no signs of  any endometrial implants and nothing could be seen. Christine Cook bladder was drained,  a mixture of Marcaine and Pyridium was left within Christine Cook bladder, Marcaine and  Kenalog were injected as a block.  Christine Cook patient tolerated Christine Cook procedure well  and was taken to Christine Cook recovery room in good condition.                                               Jamison Neighbor, M.D.    RJE/MEDQ  D:  01/10/2004  T:  01/10/2004  Job:  [id number]

## 2012-12-06 ENCOUNTER — Other Ambulatory Visit: Payer: Self-pay | Admitting: Obstetrics and Gynecology

## 2013-01-15 ENCOUNTER — Ambulatory Visit: Payer: BC Managed Care – PPO | Admitting: Family Medicine

## 2013-03-23 ENCOUNTER — Ambulatory Visit: Payer: BC Managed Care – PPO | Admitting: Family Medicine

## 2013-03-23 DIAGNOSIS — Z0289 Encounter for other administrative examinations: Secondary | ICD-10-CM

## 2013-07-10 ENCOUNTER — Other Ambulatory Visit: Payer: Self-pay | Admitting: Internal Medicine

## 2013-07-10 DIAGNOSIS — R197 Diarrhea, unspecified: Secondary | ICD-10-CM

## 2013-07-10 DIAGNOSIS — R1110 Vomiting, unspecified: Secondary | ICD-10-CM

## 2013-07-13 ENCOUNTER — Other Ambulatory Visit: Payer: BC Managed Care – PPO

## 2013-12-06 ENCOUNTER — Telehealth: Payer: Self-pay | Admitting: Internal Medicine

## 2013-12-06 NOTE — Telephone Encounter (Signed)
Spoke with Maudie Mercury at Dr. Hulen Shouts office and scheduled patient with Nicoletta Ba, Pershing on 12/11/13 at 9:00 AM.

## 2013-12-11 ENCOUNTER — Other Ambulatory Visit (INDEPENDENT_AMBULATORY_CARE_PROVIDER_SITE_OTHER): Payer: BC Managed Care – PPO

## 2013-12-11 ENCOUNTER — Encounter: Payer: Self-pay | Admitting: Physician Assistant

## 2013-12-11 ENCOUNTER — Ambulatory Visit (INDEPENDENT_AMBULATORY_CARE_PROVIDER_SITE_OTHER): Payer: BC Managed Care – PPO | Admitting: Physician Assistant

## 2013-12-11 VITALS — BP 124/72 | HR 88 | Ht 65.0 in | Wt 180.0 lb

## 2013-12-11 DIAGNOSIS — R110 Nausea: Secondary | ICD-10-CM

## 2013-12-11 DIAGNOSIS — R109 Unspecified abdominal pain: Secondary | ICD-10-CM

## 2013-12-11 DIAGNOSIS — R197 Diarrhea, unspecified: Secondary | ICD-10-CM

## 2013-12-11 LAB — CBC WITH DIFFERENTIAL/PLATELET
Basophils Absolute: 0 10*3/uL (ref 0.0–0.1)
Basophils Relative: 0.5 % (ref 0.0–3.0)
Eosinophils Absolute: 0.2 10*3/uL (ref 0.0–0.7)
Eosinophils Relative: 3.1 % (ref 0.0–5.0)
HCT: 41.7 % (ref 36.0–46.0)
Hemoglobin: 13.9 g/dL (ref 12.0–15.0)
Lymphocytes Relative: 27.8 % (ref 12.0–46.0)
Lymphs Abs: 2.2 10*3/uL (ref 0.7–4.0)
MCHC: 33.4 g/dL (ref 30.0–36.0)
MCV: 82.8 fl (ref 78.0–100.0)
Monocytes Absolute: 0.6 10*3/uL (ref 0.1–1.0)
Monocytes Relative: 7 % (ref 3.0–12.0)
Neutro Abs: 4.9 10*3/uL (ref 1.4–7.7)
Neutrophils Relative %: 61.6 % (ref 43.0–77.0)
Platelets: 317 10*3/uL (ref 150.0–400.0)
RBC: 5.03 Mil/uL (ref 3.87–5.11)
RDW: 15.4 % — ABNORMAL HIGH (ref 11.5–14.6)
WBC: 7.9 10*3/uL (ref 4.5–10.5)

## 2013-12-11 LAB — HIGH SENSITIVITY CRP: CRP, High Sensitivity: 7.56 mg/L — ABNORMAL HIGH (ref 0.000–5.000)

## 2013-12-11 MED ORDER — GLYCOPYRROLATE 2 MG PO TABS: 2.0000 mg | ORAL_TABLET | Freq: Two times a day (BID) | ORAL | Status: DC

## 2013-12-11 MED ORDER — ONDANSETRON HCL 4 MG PO TABS: ORAL_TABLET | ORAL | Status: DC

## 2013-12-11 NOTE — Progress Notes (Addendum)
Subjective:    Patient ID: Christine Cook, female    DOB: 1976/04/13, 38 y.o.   MRN: [id number]  HPI   Christine Cook is a pleasant 38 year old white female known to Dr. Henrene Pastor who carries a diagnosis of IBS and has had internal hemorrhoids. She also has chronic GERD, interstitial cystitis, anxiety and depression, adult onset diabetes mellitus. She had undergone remote procedures in [place] per Dr. Sonny Masters. She had an endoscopy done in 2005 which was normal and a colonoscopy in 2005 which did show evidence of terminal ileitis and an otherwise negative exam. Biopsies were taken, I do not see results of those in our system. She is status post cholecystectomy as well.  Patient had last been seen in the office in 2012 per Tye Savoy NP and at that time had an exacerbation of IBS manifested with more constipation. Patient comes in today stating that over the past 2-3 months she has had progressive difficulty with diarrhea. She says over the past year she has had more issues with diarrhea and constipation. At this time over the past 2-3 months she has not having any normal bowel movements and says she's having 3-5 diarrheal stools per day. She has not noticed any melena or hematochezia. She has been having intermittent abdominal cramping and notes postprandial urgency. Her appetite has been fair, her weight has been stable. She says occasionally  she is awakened at night with urgency for bowel movements. She has also had problems with increased nausea and intermittent vomiting over the past few months. She says generally she does not feel well. No documented fever, chills sweats etc. She was seen by primary care and started on Align  and Metamucil just recently but has not noticed any change in her symptoms. She is concerned because her symptoms are progressing and she has been missing work. She did take 2 courses of antibiotics earlier this winter, other than that she has not been on any new medications had  changes in dosages etc.    Review of Systems  Constitutional: Positive for appetite change and fatigue.  HENT: Negative.   Eyes: Negative.   Respiratory: Negative.   Cardiovascular: Negative.   Gastrointestinal: Positive for nausea, vomiting, abdominal pain and diarrhea.  Endocrine: Negative.   Genitourinary: Negative.   Musculoskeletal: Negative.   Skin: Negative.   Allergic/Immunologic: Negative.   Neurological: Negative.   Hematological: Negative.   Psychiatric/Behavioral: Negative.    Outpatient Prescriptions Prior to Visit  Medication Sig Dispense Refill  . metoprolol succinate (TOPROL-XL) 25 MG 24 hr tablet Take 25 mg by mouth 2 (two) times daily.       . rosuvastatin (CRESTOR) 10 MG tablet Take 10 mg by mouth daily.      Marland Kitchen topiramate (TOPAMAX) 100 MG tablet Take 100 mg by mouth daily.      Marland Kitchen venlafaxine (EFFEXOR) 100 MG tablet Take 100 mg by mouth daily.       No facility-administered medications prior to visit.   Allergies  Allergen Reactions  . Codeine   . Morphine   . Neurontin [Gabapentin] Other (See Comments)    syncope   Patient Active Problem List   Diagnosis Date Noted  . HEMORRHOIDS-INTERNAL 11/17/2010  . HEMORRHOIDS-EXTERNAL 11/17/2010  . RECTAL BLEEDING 11/17/2010  . LIVER MASS 02/24/2009  . DM 01/21/2009  . HYPERCHOLESTEROLEMIA 01/21/2009  . OBESITY 01/21/2009  . ANXIETY 01/21/2009  . DEPRESSION 01/21/2009  . GERD 01/21/2009  . IBS 01/21/2009  . 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 SLEEP  APNEA 01/21/2009  . HEADACHE, CHRONIC 01/21/2009  . TRANSAMINASES, SERUM, ELEVATED 01/21/2009   History  Substance Use Topics  . Smoking status: Former Smoker -- 0.50 packs/day for 2 years    Types: Cigarettes    Quit date: 02/14/1995  . Smokeless tobacco: Not on file  . Alcohol Use: No   family history includes Coronary artery disease in her father; Diabetes in her father; Hyperlipidemia in her father; Hypertension in her father.     Objective:    Physical Exam  well-developed young white female in no acute distress, pleasant blood pressure 124/72 pulse 88 height 5 foot 5 weight 180. HEENT; nontraumatic normocephalic EOMI PERRLA sclera anicteric, Supple ;no JVD, Cardiovascular regular rate and rhythm with S1-S2 no murmur or gallop, Pulmonary ;clear bilaterally, Abdomen ;soft she is mildly tender in the right lower quadrant right mid quadrant there is no guarding or rebound no palpable mass or hepatosplenomegaly bowel sounds are present, Rectal; exam not done, Ext;s no clubbing cyanosis or edema skin warm and dry, Psych; mood and affect appropriate        Assessment & Plan:  #1  38 yo female with 3 month history of persistent diarrhea and nausea. This is in the setting of previous diagnosis of IBS. Patient also had evidence of ileitis on remote colonoscopy in 2005 though I suspect this was deemed infectious at the time we do not have copies of those path reports. Current symptoms may be an IBS exacerbation, bacterial overgrowth, consider underlying IBD and infectious etiologies IE C. Difficile. #2 chronic GERD #3 interstitial cystitis #4 anxiety/depression   Plan; CBC with differential and CRP GI pathogen panel Start Zofran 4 mg every 6-8 hours when necessary nausea Trial of Robinul Forte 2 mg by mouth twice a day Stop Metamucil Continue daily probiotic If pathogen panel is negative will proceed with CT scan of the abdomen and pelvis with contrast to further assess for evidence of IBD. Will obtain copies of path report from colonoscopy 2005 Dr. Sonny Masters Lorina Rabon    **  Addendum- Copy of path report obtained and reads ;T.I. bx  -no pathological change  / cecum bx-;focal active colitis

## 2013-12-11 NOTE — Progress Notes (Signed)
Agree with initial assessment and plans. Amy, please obtain outside report and review. Share results with me thereafter. Proceed with CT if needed. Thanks

## 2013-12-11 NOTE — Patient Instructions (Signed)
Please go to the basement level to have your labs drawn and a stool study. We have given you a note for work today. We made you an appointment with Dr. Scarlette Shorts on 01-28-2014 at 9:15 am.  We sent prescriptions to [facility].  1. Robinul Forte 2. Zofran 4 mg.

## 2013-12-12 ENCOUNTER — Other Ambulatory Visit: Payer: BC Managed Care – PPO

## 2013-12-12 DIAGNOSIS — R109 Unspecified abdominal pain: Secondary | ICD-10-CM

## 2013-12-12 DIAGNOSIS — R197 Diarrhea, unspecified: Secondary | ICD-10-CM

## 2013-12-12 DIAGNOSIS — R110 Nausea: Secondary | ICD-10-CM

## 2013-12-13 LAB — GASTROINTESTINAL PATHOGEN PANEL PCR
C. difficile Tox A/B, PCR: NEGATIVE
Campylobacter, PCR: NEGATIVE
Cryptosporidium, PCR: NEGATIVE
E coli (ETEC) LT/ST PCR: NEGATIVE
E coli (STEC) stx1/stx2, PCR: NEGATIVE
E coli 0157, PCR: NEGATIVE
Giardia lamblia, PCR: NEGATIVE
Norovirus, PCR: NEGATIVE
Rotavirus A, PCR: NEGATIVE
Salmonella, PCR: NEGATIVE
Shigella, PCR: NEGATIVE

## 2013-12-13 NOTE — Progress Notes (Signed)
Thanks

## 2013-12-19 ENCOUNTER — Telehealth: Payer: Self-pay | Admitting: *Deleted

## 2013-12-19 DIAGNOSIS — R197 Diarrhea, unspecified: Secondary | ICD-10-CM

## 2013-12-19 DIAGNOSIS — R112 Nausea with vomiting, unspecified: Secondary | ICD-10-CM

## 2013-12-19 NOTE — Telephone Encounter (Signed)
Ok, please order CT abd/pelvis with contrast , she can take iImmodium in addition to robinul -try 2 imodium each morning then as needed up to 4 per day. We can also treat her for bact overgrowth with Xifaxan 550 twice daily x 7 days- give samples if possible- I have some-...and  schedule f/u with perry.

## 2013-12-19 NOTE — Telephone Encounter (Signed)
Patient calling to report vomiting and diarrhea x 2 days that has kept her out of work. States she felt a little better for a few days but is back to feeling awful again. Diarrhea x 5 today and last vomited 2 hours ago. C/O cramping. Denies fever. She is drinking liquids only. She is taking Robinul and Zofran. Please, advise.

## 2013-12-20 ENCOUNTER — Ambulatory Visit (INDEPENDENT_AMBULATORY_CARE_PROVIDER_SITE_OTHER)
Admission: RE | Admit: 2013-12-20 | Discharge: 2013-12-20 | Disposition: A | Discharge status: Home / Self Care | Payer: BC Managed Care – PPO | Source: Ambulatory Visit | Attending: Physician Assistant | Admitting: Physician Assistant

## 2013-12-20 ENCOUNTER — Other Ambulatory Visit (INDEPENDENT_AMBULATORY_CARE_PROVIDER_SITE_OTHER): Payer: BC Managed Care – PPO

## 2013-12-20 ENCOUNTER — Other Ambulatory Visit: Payer: Self-pay | Admitting: *Deleted

## 2013-12-20 DIAGNOSIS — R197 Diarrhea, unspecified: Secondary | ICD-10-CM

## 2013-12-20 DIAGNOSIS — R112 Nausea with vomiting, unspecified: Secondary | ICD-10-CM

## 2013-12-20 LAB — BUN: BUN: 7 mg/dL (ref 6–23)

## 2013-12-20 LAB — CREATININE, SERUM: Creatinine, Ser: 0.9 mg/dL (ref 0.4–1.2)

## 2013-12-20 MED ORDER — IOHEXOL 300 MG/ML  SOLN
100.0000 mL | Freq: Once | INTRAMUSCULAR | Status: AC | PRN
  Administered 2013-12-20: 100 mL via INTRAVENOUS

## 2013-12-20 MED ORDER — RIFAXIMIN 550 MG PO TABS: ORAL_TABLET | ORAL | Status: DC

## 2013-12-20 NOTE — Telephone Encounter (Signed)
Scheduled CT at [facility] CT([place]) today at 2:45 PM. NPO 4 hours prior. Contrast 2 and 1 hour prior. Spoke with patient and gave her appointment date, time and instructions. Contrast and instructions up front for pick up. Patient given Nicoletta Ba, PA recommendations. Xifaxan samples up front for pick up. She already is scheduled for f/u with Dr. Henrene Pastor.

## 2014-01-28 ENCOUNTER — Ambulatory Visit (INDEPENDENT_AMBULATORY_CARE_PROVIDER_SITE_OTHER): Payer: BC Managed Care – PPO | Admitting: Internal Medicine

## 2014-01-28 ENCOUNTER — Encounter: Payer: Self-pay | Admitting: Internal Medicine

## 2014-01-28 VITALS — BP 110/80 | HR 72 | Ht 65.0 in | Wt 178.6 lb

## 2014-01-28 DIAGNOSIS — R109 Unspecified abdominal pain: Secondary | ICD-10-CM

## 2014-01-28 DIAGNOSIS — R110 Nausea: Secondary | ICD-10-CM

## 2014-01-28 DIAGNOSIS — K589 Irritable bowel syndrome without diarrhea: Secondary | ICD-10-CM

## 2014-01-28 DIAGNOSIS — R197 Diarrhea, unspecified: Secondary | ICD-10-CM

## 2014-01-28 MED ORDER — CILIDINIUM-CHLORDIAZEPOXIDE 2.5-5 MG PO CAPS: ORAL_CAPSULE | ORAL | Status: DC

## 2014-01-28 MED ORDER — DIPHENOXYLATE-ATROPINE 2.5-0.025 MG PO TABS: 1.0000 | ORAL_TABLET | Freq: Two times a day (BID) | ORAL | Status: DC

## 2014-01-28 NOTE — Patient Instructions (Signed)
We have sent the following medications to your pharmacy for you to pick up at your convenience:  Lomotil  Librax  Please follow up with Dr. Henrene Pastor in 4-6 weeks

## 2014-01-28 NOTE — Progress Notes (Signed)
HISTORY OF PRESENT ILLNESS:  Christine Cook is a 38 y.o. female with morbid obesity, hyperlipidemia, type 2 diabetes, anxiety/depression, chronic headaches, interstitial cystitis, GERD, and irritable bowel syndrome. She has long-standing GI complaints including chronic abdominal pain and diarrhea. Previous extensive evaluations in [place]. Please see office note dated 02/19/2009. I last saw her then. Seen by advanced practitioners on 2 occasions since. Most recently 12/11/2013 for worsening diarrhea intermittent nausea and abdominal pain. She has been on probiotic align for 2 months without improvement. Symptoms have been present since last October. She tries Lomotil several times per week with slight improvement Robinul several times per week with improvement in pain. Zofran several times per week with improvement in nausea. Continues on Protonix 40 mg twice a day. Some reflux symptoms despite this. She did take a one-week course of Xifaxan without improvement. Eating and stress exacerbate symptoms. I asked her about stress, and she became tearful. Issue surrounding her job and finances. She feels this may be the main issue. She was started on Abilify last summer. She thinks this helps some period she has been on Effexor for years. At the time of her last visit she underwent laboratories. CBC was unremarkable. C. reactive protein was slightly elevated at 7.56. She did undergo CT scan of the abdomen and pelvis 12/20/2013. This was unremarkable. Last colonoscopy and upper endoscopy with biopsies are in [place] 2005.  REVIEW OF SYSTEMS:  All non-GI ROS negative except for anxiety/depression  Past Medical History  Diagnosis Date  . Heart palpitations   . Tachycardia   . Depression   . Heart murmur   . High cholesterol   . Hypertension   . 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 Fatty liver disease, nonalcoholic     Past Surgical History  Procedure Laterality Date  .  Tonsillectomy    . Adenoidectomy    . Cholecystectomy    . Knee surgery      Left knee  . Bladder surgery      Social History Christine Cook  reports that she quit smoking about 18 years ago. Her smoking use included Cigarettes. She has a 1 pack-year smoking history. She has never used smokeless tobacco. She reports that she does not drink alcohol or use illicit drugs.  family history includes Coronary artery disease in her father; Diabetes in her father; Hyperlipidemia in her father; Hypertension in her father.  Allergies  Allergen Reactions  . Codeine   . Morphine   . Neurontin [Gabapentin] Other (See Comments)    syncope       PHYSICAL EXAMINATION: Vital signs: BP 110/80  Pulse 72  Ht 5\' 5"  (1.651 m)  Wt 178 lb 9.6 oz (81.012 kg)  BMI 29.72 kg/m2 General: Well-developed, well-nourished, no acute distress HEENT: Sclerae are anicteric, conjunctiva pink. Oral mucosa intact Lungs: Clear Heart: Regular Abdomen: soft, nontender, nondistended, no obvious ascites, no peritoneal signs, normal bowel sounds. No organomegaly. Extremities: No edema Psychiatric: alert and oriented x3. Cooperative   ASSESSMENT:  #1. Diarrhea predominant IBS #2. Anxiety/depression   PLAN:  #1. Discussion of IBS. #2. Prescribe Librax 1 by mouth a.c. when necessary #3 Use Lomotil more frequently. Once or twice daily for now. Hold for constipation #4. Reflux precautions #5. Continue PPI #6. Office followup in 4-6 weeks. If symptoms persist without improvement, consider colonoscopy with biopsies to rule out microscopic colitis. Her previous colonoscopy elsewhere did reveal nonspecific focal active colitis of the cecum.

## 2014-02-18 ENCOUNTER — Encounter: Payer: Self-pay | Admitting: *Deleted

## 2014-02-20 ENCOUNTER — Encounter: Payer: Self-pay | Admitting: Neurology

## 2014-02-20 ENCOUNTER — Ambulatory Visit (INDEPENDENT_AMBULATORY_CARE_PROVIDER_SITE_OTHER): Payer: BC Managed Care – PPO | Admitting: Neurology

## 2014-02-20 ENCOUNTER — Encounter (INDEPENDENT_AMBULATORY_CARE_PROVIDER_SITE_OTHER): Payer: Self-pay

## 2014-02-20 VITALS — BP 115/72 | HR 67 | Resp 16 | Ht 66.0 in | Wt 180.0 lb

## 2014-02-20 DIAGNOSIS — E669 Obesity, unspecified: Secondary | ICD-10-CM

## 2014-02-20 DIAGNOSIS — G4730 Sleep apnea, unspecified: Principal | ICD-10-CM

## 2014-02-20 DIAGNOSIS — Z9989 Dependence on other enabling machines and devices: Secondary | ICD-10-CM

## 2014-02-20 DIAGNOSIS — G4710 Hypersomnia, unspecified: Secondary | ICD-10-CM

## 2014-02-20 DIAGNOSIS — G4733 Obstructive sleep apnea (adult) (pediatric): Secondary | ICD-10-CM

## 2014-02-20 HISTORY — DX: Hypersomnia, unspecified: G47.10

## 2014-02-20 MED ORDER — ARMODAFINIL 200 MG PO TABS: 200.0000 mg | ORAL_TABLET | Freq: Every morning | ORAL | Status: DC

## 2014-02-20 NOTE — Patient Instructions (Signed)
Narcolepsy Narcolepsy is a disabling neurological disorder of sleep regulation. It affects the control of sleep. It also affects the control of wakefulness. It is an interruption of the dreaming state of sleep. This state is known as REM or rapid eye movement sleep.  SYMPTOMS  The development, number, and severity of symptoms vary widely among people with the disorder. Symptoms generally begin between the ages of 15 and 30. The four classic symptoms of the disorder are:   Excessive daytime sleepiness.  Cataplexy. This is sudden, brief episodes of muscle weakness or paralysis. It is caused by strong emotions. Common strong emotions are laughter, anger, surprise, or anticipation.  Sleep paralysis. This is paralysis upon falling asleep or waking up.  Hallucinations. These are vivid dream-like images that occur at when you first fall asleep. Other symptoms include:   Unrelenting excessive sleepiness. This is usually the first and most obvious symptom.  Sleep attacks. Patients have strong sleep attacks throughout the day. These attacks can last for 30 seconds to more than 30 minutes. These happen no matter how much or how well the person slept the night before. These attacks end up making the person sleep at work and social events. The person can fall asleep while eating, talking, and driving. They also fall asleep at other out of place times.  Disturbed nighttime sleep.  Tossing and turning in bed.  Leg jerks.  Nightmares.  Waking up often. DIAGNOSIS  It's possible that genetics play a role in this disorder. Narcolepsy is not a rare disorder. It is often misdiagnosed. It is often diagnosed years after symptoms first appear. Early diagnosis and treatment are important. This help the physical and mental well-being of the patient. TREATMENT  There is no cure for narcolepsy. The symptoms can be controlled with behavioral and medical therapy. The excessive daytime sleepiness may be treated with  stimulant drugs. It may also be treated with the drug modafinil (Provigil). Cataplexy and other REM-sleep symptoms may be treated with antidepressant medications. Medications will reduce the symptoms. Medications will not ease symptoms entirely. Many available medications have side effects. Basic lifestyle changes may also reduce the symptoms. These changes include having regular sleep schedules and scheduled daytime naps. Other lifestyle changes include avoiding "over-stimulating" situations. Document Released: 09/10/2002 Document Revised: 12/13/2011 Document Reviewed: 09/20/2005 Exit[employer] Patient Information 2014 [employer].  

## 2014-02-20 NOTE — Progress Notes (Signed)
[facility]  Provider:  Larey Seat, M D  Referring Provider: Marton Redwood, MD Primary Care Physician:  Marton Redwood, MD  Chief Complaint  Patient presents with  . New Evaluation    Room 10  . Room 10    HPI:  Christine Cook is a 38 y.o. caucasian , right handed, single female, who  is seen here as a referral/ revisit  from Dr. Brigitte Pulse for hypersomnia, persistent .    Christine Cook was diagnosed on 10-02-09 with a mild-to-moderate form of obstructive sleep apnea with an AHI of 16.5 , and  supine AHI of 18.2/hr.  There was no REM accentuation noted.  Oxygen nadir was 86%, there were frequent periodic limb movements noted with an arousal index by PLMs of 7.9 - the total arousal index was very high at 21.4,  indicating that the patient was a light sleeper and had very fragmented sleep.  Her heart rate also varied greatly between 42 beats per minute and 84 beats per minute. She was also snoring loudly. CPAP titration followed on 11-28-09- to 11 cm water. The PML  index was higher now at 15.5/ hr. and  the residual AHI was only 0.5/hr.  ,the oxygen nadir was 86% with a total of 0.3 minutes of desaturation time.  Christine Cook stated that she was unable to tolerate the CPAP and stopped using it in 2011. Since then she has been falling asleep during the day every day at work and at home by sleeping 8-10 hours at night. She does not feel rested when awakening in the morning snoring has been witnessed and she is drinking caffeinated sodas per day to keep calling court" usual bedtime is around 10 PM, and she falls asleep promptly. She sleeps alone and describes her bedroom as, quiet, dark. Does not watch TV in the bedroom.  She does drink caffeinated beverages until bedtime. One nocturia break at night, but falls asleep easily again. In the morning, she needs an alarm and hits the snooze button many times. She is neither restored nor refreshed, has a very dry  mouth. She sleeps on her side.  She already feels sleepy on her way to work, and at her desk she has to fight the irresistible urge to go to sleep. Her "boss" has noted and she was unaware how much of the day she is unproductive. She dreams even in short naps, she has hypnapompic hallucinations when she is startled or hears a "good joke" .  She has trouble swallowing and chokes, feels weakness in chin and lips. A feeing of numbness as well as weakness accompanies these spells. On Nuvigil , she feels safer to drive but has to be cautioned not to drive when drowsy. No on highway , not at night. She drives with her window down and radio" full blast". She keeps walking about in the office to not fall asleep.    She gained 20 pounds since the last sleep study in 2011 ,body weight and has an enlarged neck circumference.   She works 8 AM to 5 PM  and has no shift work history ,nearly no daylight exposure in the office.  Tonsillectomy. No facial or neck airway surgeries, ENT procedures.   No known family history of a sleep disorder, she noted her sleepiness only as an adult.  Nuvigil has helped, was just initiated at 200 mg .  Effexor used for many years) diabetic type 2 since age 89. She cannot easily  wean off for MSLT.        Review of Systems: Out of a complete 14 system review, the patient complains of only the following symptoms, and all other reviewed systems are negative.  the patient endorsed a fatigue severity score of 61 points, and an Epworth sleepiness score  ( EDS ) of 21 points. Please note that the EDS score in 2010 was only 4 points.     History   Social History  . Marital Status: Single    Spouse Name: N/A    Number of Children: 1  . Years of Education: College   Occupational History  . Support specialist   .     Social History Main Topics  . Smoking status: Former Smoker -- 0.50 packs/day for 2 years    Types: Cigarettes    Quit date: 02/14/1995  . Smokeless tobacco:  Never Used  . Alcohol Use: No  . Drug Use: No  . Sexual Activity: Not on file   Other Topics Concern  . Not on file   Social History Narrative   Patient is single and lives alone.   Patient works at [employer].   Patient has a college education.   Patient has one child.   Patient is right-handed.   Patient drinks two 20 oz of Coke daily.    Family History  Problem Relation Age of Onset  . Hypertension Father   . Coronary artery disease Father   . Diabetes Father   . Asthma Mother   . Asthma Daughter     Past Medical History  Diagnosis Date  . Heart palpitations   . Tachycardia   . Depression   . Heart murmur   . High cholesterol   . Hypertension   . 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 Fatty liver disease, nonalcoholic   . Diabetes   . Migraine   . Anxiety   . Sleep apnea with hypersomnolence     CPAP study 11-28-09,  10-02-2009 AHI was 16.5, pressure to   . Irritable bowel syndrome with diarrhea     Past Surgical History  Procedure Laterality Date  . Tonsillectomy    . Adenoidectomy    . Cholecystectomy    . Knee surgery      Left knee x 2   . Bladder surgery      Current Outpatient Prescriptions  Medication Sig Dispense Refill  . ARIPiprazole (ABILIFY) 5 MG tablet Take 5 mg by mouth daily.      Marland Kitchen atenolol (TENORMIN) 50 MG tablet Take 50 mg by mouth at bedtime.      . clidinium-chlordiazePOXIDE (LIBRAX) 5-2.5 MG per capsule Take one before meals prn  90 capsule  3  . diphenoxylate-atropine (LOMOTIL) 2.5-0.025 MG per tablet Take 1 tablet by mouth 2 (two) times daily.  60 tablet  3  . glycopyrrolate (ROBINUL) 2 MG tablet Take 1 tablet (2 mg total) by mouth 2 (two) times daily.  60 tablet  3  . ondansetron (ZOFRAN) 4 MG tablet Take 1 tab every 6 hours as needed for cramping, spasms diarrhea.  40 tablet  2  . pantoprazole (PROTONIX) 40 MG tablet Take 40 mg by mouth 2 (two) times daily.      . Probiotic Product (ALIGN) 4 MG CAPS Take 1 capsule by mouth daily.       . rifaximin (XIFAXAN) 550 MG TABS tablet Take one po BID x 7 days  14 tablet  0  .  venlafaxine XR (EFFEXOR-XR) 150 MG 24 hr capsule Take 300 mg by mouth daily with breakfast.       No current facility-administered medications for this visit.    Allergies as of 02/20/2014 - Review Complete 02/20/2014  Allergen Reaction Noted  . Codeine  01/21/2009  . Morphine  01/21/2009  . Neurontin [gabapentin] Other (See Comments) 02/14/2012    Vitals: BP 115/72  Pulse 67  Resp 16  Ht _0  (1.676 m)  Wt 180 lb (81.647 kg)  BMI 29.07 kg/m2 Last Weight:  Wt Readings from Last 1 Encounters:  02/20/14 180 lb (81.647 kg)   Last Height:   Ht Readings from Last 1 Encounters:  02/20/14 _1  (1.676 m)    Physical exam:  General: The patient is awake, alert and appears not in acute distress. The patient is well groomed. Head: Normocephalic, atraumatic. Neck is supple. Mallampati 3 neck circumference:16.5 inches , and no TMJ, there is a remarkable disproportion between neck size and body habit. Retrognathia.  Cardiovascular:  Regular rate and rhythm, without  murmurs or carotid bruit, and without distended neck veins. Respiratory: Lungs are clear to auscultation. Skin:  Without evidence of edema, or rash Trunk: BMI is elevated, but not severely obese,  and patient has truncal and neck obesity.   Neurologic exam : The patient is awake and alert, oriented to place and time.  Memory subjective  described as intact.  Her attention is limited due to fatigue.  There is a normal attention span & concentration ability. Speech is fluent without  dysarthria, dysphonia or aphasia. Mood and affect are appropriate.  Cranial nerves: Pupils are equal and briskly reactive to light. Funduscopic exam without  evidence of pallor or edema. Extraocular movements  in vertical and horizontal planes intact and without nystagmus. Visual fields by finger perimetry are intact. Hearing to finger rub intact.  Facial  sensation intact to fine touch. Facial motor strength is symmetric and tongue and uvula move midline.  Motor exam:  Normal tone , muscle bulk and strength in all extremities.  Sensory:  Fine touch, pinprick and vibration were normal in ankle and toes and hands. Proprioception is tested in the upper extremities normal.  Coordination: Rapid alternating movements in the fingers/hands is tested and normal. Finger-to-nose maneuver tested and normal without evidence of ataxia, dysmetria or tremor.  Gait and station: Patient walks without assistive device . She feels clumsy, she had no falls.  Deep tendon reflexes: in the  upper and lower extremities are symmetric and intact. Babinski maneuver response is downgoing.   Assessment:  After physical and neurologic examination, review of laboratory studies, imaging, neurophysiology testing and pre-existing records, assessment is  Severe hypersomnia, associated with symptoms of narcolepsy. She is untreated for previously mild OSA,  She needs to be retitrated- her machine is pristine and barely used, should be  Reset to the new pressure. She may qualify for a dental device , if apnea is still mild and mainly UARS is present. I would like for her to undergo an MSLT , but Effexor is a REM suppressant and may invalidate the test. To wean Effexor is very, very difficult. I will order an HLA test first, and if not positive wait for 30 days of apnea being treated before deciding if MSLT is still indicated. I explained the differential diagnosis and treatment of CSA, OSA and narcolepsy to the patient.   On Nuvigil , she feels safer to drive but has to be cautioned not to drive  when drowsy. No on highway , not at night. She drives with her window down and radio" full blast". She keeps walking about in the office to not fall asleep.      Plan:  Treatment plan and additional workup : this patient s employment is depending on treating and identifying the source of EDS.    SPLIT at 10 , 3% score, own machine can be set.MSLT to follow if no apnea, or below 10 AHI.  Patient will use 50% dose of effexor for 8-10 days prior to test .  2) HLA test today. 3) RV 30 days after using CPAP or right after sleep testing to discuss further diagnostics.  Refill Nuvigil.     SPLIT at 10 AHI

## 2014-03-04 DIAGNOSIS — G47419 Narcolepsy without cataplexy: Secondary | ICD-10-CM

## 2014-03-04 HISTORY — DX: Narcolepsy without cataplexy: G47.419

## 2014-03-06 ENCOUNTER — Telehealth: Payer: Self-pay | Admitting: Neurology

## 2014-03-06 DIAGNOSIS — G47419 Narcolepsy without cataplexy: Secondary | ICD-10-CM

## 2014-03-06 NOTE — Telephone Encounter (Signed)
The HLA tests returned today, RN to call results.

## 2014-03-06 NOTE — Telephone Encounter (Signed)
Spoke to patient and relayed positive HLA DQ typing for narcolepsy.  The patient is asking for advice because she is still falling asleep at work after taking Armodolfinil 242m.

## 2014-03-06 NOTE — Telephone Encounter (Signed)
Patient calling for BW results and still falling asleep at work after taking Nuvigil 200 mg.  Please call and advise.

## 2014-03-06 NOTE — Telephone Encounter (Signed)
Called pt to inform her that her narcolepsy test results has not gotten back yet and pt stated that she is still falling asleep at work after taking Nuvigil 200 mg, please advise

## 2014-03-07 MED ORDER — ARMODAFINIL 250 MG PO TABS: 250.0000 mg | ORAL_TABLET | ORAL | Status: DC

## 2014-03-07 NOTE — Telephone Encounter (Signed)
I have tried to reach patient at [phone], no answer and unable to leave message as VM not set up.

## 2014-03-07 NOTE — Telephone Encounter (Signed)
Thank you, I had gone and seen one sample card left, but by lunch time it was gone !

## 2014-03-07 NOTE — Telephone Encounter (Signed)
Patient's father came to pick up samples, but no Nuvigil 250mg  samples available.  I gave him the 30 day free card and prescription.

## 2014-03-07 NOTE — Telephone Encounter (Signed)
I called the patient back.  Relayed message from Dr Brett Fairy.  She verbalized understanding.  Says she will be in after 1pm today to get the samples, Rx and voucher the provider is offering to leave at the front desk.    Patient says she would like a call back from the nurse or MD regarding her test results.  She says she would like more specific info and has some additional questions.   Please advise.  Thank you.

## 2014-03-07 NOTE — Telephone Encounter (Signed)
Modafinil often helps transiently- for 6-8 hours , but not longer.  She may want to try 250 mg NUVIGIL , brand name . We still have samples and 30 day for free cards that I will offer her. I will print a prescription for 250 mg today and give that with a coupon card to the front desk to pick up, or we can mail it to her. Should she be failed by that approach , too :THE NEXT STEP will be XYREM. CD

## 2014-03-12 ENCOUNTER — Telehealth: Payer: Self-pay | Admitting: Neurology

## 2014-03-12 NOTE — Telephone Encounter (Signed)
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  2) She will go to take 1/2 nuvigil tab in AM and another half 4 hours later to allow a full work day. Next step would have been XYREM .

## 2014-03-12 NOTE — Telephone Encounter (Signed)
Returned patient's call about questions she had.  Her question is:  What does it mean that the genetic marker is positive?  I relayed that it would be best answered by the doctor.    She also wanted doctor to know she started the Nuvigil 250mg  on Monday and fell asleep at work Monday and today.    Also has disability paperwork for work, will doctor agree to fill out.

## 2014-03-13 NOTE — Telephone Encounter (Signed)
Spoke to patient and relayed answer about narcolepsy test, per Dr. Brett Fairy and also information about nuvigil and possible Xyrem as next step, per Dr. Brett Fairy.  Patient expressed understanding.

## 2014-03-15 ENCOUNTER — Encounter: Payer: Self-pay | Admitting: Internal Medicine

## 2014-03-15 ENCOUNTER — Ambulatory Visit (INDEPENDENT_AMBULATORY_CARE_PROVIDER_SITE_OTHER): Payer: BC Managed Care – PPO | Admitting: Internal Medicine

## 2014-03-15 VITALS — BP 100/60 | HR 64 | Ht 65.0 in | Wt 180.4 lb

## 2014-03-15 DIAGNOSIS — R110 Nausea: Secondary | ICD-10-CM

## 2014-03-15 DIAGNOSIS — R109 Unspecified abdominal pain: Secondary | ICD-10-CM

## 2014-03-15 DIAGNOSIS — R197 Diarrhea, unspecified: Secondary | ICD-10-CM

## 2014-03-15 DIAGNOSIS — K589 Irritable bowel syndrome without diarrhea: Secondary | ICD-10-CM

## 2014-03-15 NOTE — Progress Notes (Signed)
HISTORY OF PRESENT ILLNESS:  Christine Cook is a 38 y.o. female with obesity, hyperlipidemia, type 2 diabetes, anxiety/depression, chronic headaches, interstitial cystitis, GERD, and irritable bowel syndrome. She has long-standing GI complaints and has been evaluated in [place] as well as [place]. Last seen in the office 01/28/2014. See that dictation for details. Recent CT scan and blood work were unremarkable except for elevated C. reactive protein. Last colonoscopy and upper endoscopy with biopsies were performed in [place] 2005. Nonspecific focal active colitis and cecum noted. At the time of her last visit she was felt to have diarrhea predominant IBS complicated by chronic anxiety with depression. We discussed IBS. She was asked increase Lomotil more frequently. She now takes one or 2 daily. This has resulted in less bowel movements after morning time. Also, she was prescribed Librax. She cannot take during the day as it makes her sleepy. However, she does take it before her evening meal and states it "calmed down my stomach". Overall she describes her IBS is manageable. We've previously discussed repeating colonoscopy (given questionable nonspecific colitis on prior biopsy), she is not interested in pursuing that option at this time. Probably new issue is possibility of being diagnosed with narcolepsy  REVIEW OF SYSTEMS:  All non-GI ROS negative except for anxiety, depression, headaches, sleeping problems  Past Medical History  Diagnosis Date  . Heart palpitations   . Tachycardia   . Depression   . Heart murmur   . High cholesterol   . Hypertension   . 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 Fatty liver disease, nonalcoholic   . Diabetes   . Migraine   . Anxiety   . Sleep apnea with hypersomnolence     CPAP study 11-28-09,  10-02-2009 AHI was 16.5, pressure to   . Irritable bowel syndrome with diarrhea   . 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 Tonsillectomy    . Adenoidectomy    . Cholecystectomy    . Knee surgery      Left knee x 2   . Bladder surgery      Social History Christine Cook  reports that she quit smoking about 19 years ago. Her smoking use included Cigarettes. She has a 1 pack-year smoking history. She has never used smokeless tobacco. She reports that she does not drink alcohol or use illicit drugs.  family history includes Asthma in her daughter and mother; Coronary artery disease in her father; Diabetes in her father; Hypertension in her father. There is no history of Colon cancer.  Allergies  Allergen Reactions  . Codeine   . Morphine   . Neurontin [Gabapentin] Other (See Comments)    syncope       PHYSICAL EXAMINATION: Vital signs: BP 100/60  Pulse 64  Ht 5\' 5"  (1.651 m)  Wt 180 lb 6.4 oz (81.829 kg)  BMI 30.02 kg/m2 General: Well-developed, obese, well-nourished, no acute distress HEENT: Sclerae are anicteric, conjunctiva pink. Oral mucosa intact Lungs: Clear Heart: Regular Abdomen: soft, obese, nontender, nondistended, no obvious ascites, no peritoneal signs, normal bowel sounds. No organomegaly. Extremities: No edema Psychiatric: alert and oriented x3. Cooperative   ASSESSMENT:  #1. Diarrhea predominant IBS #2. Previous colonoscopy and upper endoscopy elsewhere 2005. Normal except for nonspecific mild focal colitis of the cecum. #3. Anxiety/depression #4. Multiple medical problems   PLAN:  #1. Continue Lomotil #2. Continue Librax #3. Routine GI followup in  6 months. Contact the office in the interim if needed

## 2014-03-15 NOTE — Patient Instructions (Signed)
Please follow up with Dr. Perry in 6 months 

## 2014-03-19 ENCOUNTER — Telehealth: Payer: Self-pay | Admitting: *Deleted

## 2014-03-21 NOTE — Telephone Encounter (Signed)
Called pt and pt stated that she is still falling asleep even after taking the Nuvigil 250 mg. I also informed the pt that her paperwork is still being worked on and nurse will try to get it finished today, per Butch Penny, Therapist, sports. Pt would like to know what else is it that she could do. Please advise

## 2014-03-21 NOTE — Telephone Encounter (Signed)
I can add adderall 10 mg tab.

## 2014-03-25 NOTE — Telephone Encounter (Signed)
Spoke to patient and she would like to try adding the Adderall 10 mg.   She also said she has been getting a headache and chest heaviness, at times, after she take the  [facility], this has been happening on and off for a few weeks.  But today she took it and no reaction, she wanted doctor to be aware.

## 2014-03-25 NOTE — Telephone Encounter (Signed)
Spoke to Dr. Brett Fairy and she gave verbal order that patient can get the Adderall 10mg .

## 2014-03-25 NOTE — Telephone Encounter (Signed)
Patient calling back and stated Nuvigil 250 mg, causing headaches and chest pain.  Not helping with keeping her awake during the day.  Please call and advise.

## 2014-03-26 ENCOUNTER — Other Ambulatory Visit: Payer: Self-pay

## 2014-03-26 ENCOUNTER — Telehealth: Payer: Self-pay

## 2014-03-26 MED ORDER — AMPHETAMINE-DEXTROAMPHETAMINE 10 MG PO TABS: 10.0000 mg | ORAL_TABLET | Freq: Every morning | ORAL | Status: DC

## 2014-03-26 NOTE — Telephone Encounter (Signed)
Dr Brett Fairy, the nurse sent me a message asking that I add Adderall 10mg  to the patient chart.  I did add the Rx to med list, however, no sig was specified, so I entered it with instructions of one daily.  The patient is requesting this Rx.  I wanted to ensure these are the directions you would like prescribed before proceeding with the refill request.  Please advise.  Thank you.

## 2014-03-26 NOTE — Telephone Encounter (Signed)
Hallo jessica,  That's exactly what I intended. 10 mg in AM  Thank you, [employer].

## 2014-03-28 ENCOUNTER — Telehealth: Payer: Self-pay | Admitting: Neurology

## 2014-03-28 ENCOUNTER — Other Ambulatory Visit: Payer: Self-pay | Admitting: Neurology

## 2014-03-28 MED ORDER — AMPHETAMINE-DEXTROAMPHETAMINE 10 MG PO TABS: 10.0000 mg | ORAL_TABLET | Freq: Every morning | ORAL | Status: DC

## 2014-03-28 NOTE — Telephone Encounter (Signed)
Pt called because HLA marker returned positive results indicating a diagnosis of narcolepsy.  She would like to know if she still needs to proceed with the sleep study that has already been scheduled for 6/30-7/1 or can she cancel it.

## 2014-03-28 NOTE — Telephone Encounter (Signed)
Pt called to get the status of her written medication refill amphetamine-dextroamphetamine (ADDERALL) 10 MG tablet, please call pt when this is ready for pick up. Thanks

## 2014-03-29 NOTE — Telephone Encounter (Signed)
Called pt to inform her that her Rx was ready to be picked up at the front desk and if she has any other problems, questions or concerns to call the office. Pt verbalized understanding. °

## 2014-03-29 NOTE — Telephone Encounter (Signed)
i want to see her REM onset - this is helpful in estimating the degree of REM pressure.

## 2014-03-29 NOTE — Telephone Encounter (Signed)
Dr. Brett Fairy wants patient to continue to sleep study in order to see REM sleep during nap periods.

## 2014-04-01 NOTE — Telephone Encounter (Signed)
Pt notified that she still must proceed with MSLT

## 2014-04-02 ENCOUNTER — Ambulatory Visit (INDEPENDENT_AMBULATORY_CARE_PROVIDER_SITE_OTHER): Payer: BC Managed Care – PPO | Admitting: Neurology

## 2014-04-02 DIAGNOSIS — G4733 Obstructive sleep apnea (adult) (pediatric): Secondary | ICD-10-CM

## 2014-04-02 DIAGNOSIS — Z9989 Dependence on other enabling machines and devices: Secondary | ICD-10-CM

## 2014-04-02 DIAGNOSIS — G4710 Hypersomnia, unspecified: Secondary | ICD-10-CM

## 2014-04-02 DIAGNOSIS — G4730 Sleep apnea, unspecified: Principal | ICD-10-CM

## 2014-04-02 DIAGNOSIS — E669 Obesity, unspecified: Secondary | ICD-10-CM

## 2014-04-09 ENCOUNTER — Encounter: Payer: Self-pay | Admitting: *Deleted

## 2014-04-09 ENCOUNTER — Telehealth: Payer: Self-pay | Admitting: Neurology

## 2014-04-09 DIAGNOSIS — G4733 Obstructive sleep apnea (adult) (pediatric): Secondary | ICD-10-CM

## 2014-04-09 NOTE — Telephone Encounter (Signed)
I called and spoke with the patient about her recent sleep study results. I informed the patient that the study revealed obstructive sleep apnea and that she did well on CPAP during the night of her study. Dr. Brett Fairy recommend starting CPAP therapy at home. I will send the order to [facility]. I will fax a copy of the report to Dr. Jacquelynn Cree office and mail a copy to the patient along with a follow up instruction letter.

## 2014-04-12 ENCOUNTER — Telehealth: Payer: Self-pay | Admitting: Neurology

## 2014-04-16 NOTE — Telephone Encounter (Signed)
No show

## 2014-04-30 ENCOUNTER — Other Ambulatory Visit: Payer: Self-pay | Admitting: Neurology

## 2014-04-30 MED ORDER — AMPHETAMINE-DEXTROAMPHETAMINE 10 MG PO TABS: 10.0000 mg | ORAL_TABLET | Freq: Every morning | ORAL | Status: DC

## 2014-04-30 NOTE — Telephone Encounter (Signed)
Request forwarded to provider for approval  

## 2014-04-30 NOTE — Telephone Encounter (Signed)
Patient stated Pharmacy instructed her to call to get Rx refill for amphetamine-dextroamphetamine (ADDERALL) 10 MG tablet, requesting 3 month supply.  Please call anytime and advise.

## 2014-05-01 ENCOUNTER — Telehealth: Payer: Self-pay | Admitting: Neurology

## 2014-05-01 NOTE — Telephone Encounter (Signed)
Fax received.  All clinical info has been provided.  Request is currently pending.

## 2014-05-01 NOTE — Telephone Encounter (Signed)
Christine Cook with [facility] @ [phone], called and stated prior authorization was needed for amphetamine-dextroamphetamine (ADDERALL) 10 MG tablet.  Will fax over prior authorization form.  Please call and advise.

## 2014-05-01 NOTE — Telephone Encounter (Signed)
Called pt to inform her that her Rx was ready to be picked up at the front desk and if she has any other problems, questions or concerns to call the office. Pt verbalized understanding. °

## 2014-05-05 ENCOUNTER — Telehealth: Payer: Self-pay

## 2014-05-05 NOTE — Telephone Encounter (Signed)
[facility] sent [place] notification they have approval our request for coverage on Adderall effective until 06/13/2014 Ref # [id number]

## 2014-05-22 ENCOUNTER — Encounter: Payer: Self-pay | Admitting: Neurology

## 2014-05-22 ENCOUNTER — Ambulatory Visit (INDEPENDENT_AMBULATORY_CARE_PROVIDER_SITE_OTHER): Payer: BC Managed Care – PPO | Admitting: Neurology

## 2014-05-22 VITALS — BP 127/85 | HR 91 | Resp 17 | Ht 66.0 in | Wt 183.0 lb

## 2014-05-22 DIAGNOSIS — G4733 Obstructive sleep apnea (adult) (pediatric): Secondary | ICD-10-CM

## 2014-05-22 DIAGNOSIS — G47411 Narcolepsy with cataplexy: Secondary | ICD-10-CM

## 2014-05-22 DIAGNOSIS — Z9989 Dependence on other enabling machines and devices: Secondary | ICD-10-CM

## 2014-05-22 HISTORY — DX: Narcolepsy with cataplexy: G47.411

## 2014-05-22 MED ORDER — SODIUM OXYBATE 500 MG/ML PO SOLN: ORAL | Status: DC

## 2014-05-22 MED ORDER — AMPHETAMINE-DEXTROAMPHETAMINE 10 MG PO TABS: 10.0000 mg | ORAL_TABLET | Freq: Every morning | ORAL | Status: DC

## 2014-05-22 MED ORDER — AMPHETAMINE-DEXTROAMPHETAMINE 10 MG PO TABS: 10.0000 mg | ORAL_TABLET | Freq: Every day | ORAL | Status: DC

## 2014-05-22 NOTE — Progress Notes (Signed)
.  hh

## 2014-05-22 NOTE — Patient Instructions (Signed)
Narcolepsy Narcolepsy is a nervous system disorder that causes daytime sleepiness and sudden bouts of irresistible sleep during the day (sleep attacks). Many people with narcolepsy live with extreme daytime sleepiness for many years before being diagnosed and treated. Narcolepsy is a lifelong (chronic) disorder.  You normally go through cycles when you sleep. When your sleep becomes deeper, you have less body movement, and you start dreaming. This type of deep sleep should happen after about 90 minutes of lighter sleep. Deep sleep is called rapid eye movement (REM) sleep. When you have narcolepsy, REM is not well regulated. It can happen as soon as you fall asleep, and components of REM sleep can occur during the day when you are awake. Uncontrolled REM sleep causes symptoms of narcolepsy. CAUSES Narcolepsy may be caused by an abnormality with a chemical messenger (neurotransmitter) in your brain that controls your sleep and wake cycles. Most people with narcolepsy have low levels of the neurotransmitter hypocretin. Hypocretin is important for controlling wakefulness.  A hypocretin imbalance may be caused by abnormal genes that are passed down through families. It may also develop if the body's defense system (immune system) mistakenly attacks the brain cells that produce hypocretin (autoimmune disease). RISK FACTORS You may be at higher risk for narcolepsy if you have a family history of the disease. Other risk factors that may contribute include:  Injuries, tumors, or infections in the areas of the brain that control sleep.  Exposure to toxins.  Stress.  Hormones produced during puberty and menopause.  Poor sleep habits. SIGNS AND SYMPTOMS  Symptoms of narcolepsy can start at any age but usually begin when people are between the ages of 7 and 25 years. There are four major symptoms. Not everyone with narcolepsy will have all four.   Excessive daytime sleepiness. This is the most common symptom  and is usually the first symptom you will notice.  You may feel as if you are in a mental fog.  Daytime sleepiness may severely affect your performance at work or school.  You may fall asleep during a conversation or while eating dinner.  Sudden loss of muscle tone (cataplexy). You do not lose consciousness, but you may suddenly lose muscle control. When this occurs, your speech may become slurred, or your knees may buckle. This symptom is usually triggered by surprise, anger, or laughter.  Sleep paralysis. You may lose the ability to speak or move just as you start to fall asleep or wake up. You will be aware of the paralysis. It usually lasts for just a few seconds or minutes.  Vivid hallucinations. These may occur with sleep paralysis. The hallucinations are like having bizarre or frightening dreams while you are still awake. Other symptoms may include:   Trouble staying asleep at night (insomnia).  Restless sleep.  Feeling a strong urge to get up at night to smoke or eat. DIAGNOSIS  Your health care provider can diagnose narcolepsy based on your symptoms and the results of two diagnostic tests. You may also be asked to keep a sleep diary for several weeks. You may need the tests if you have had daytime sleepiness for at least 3 months. The two tests are:   A polysomnogram to find out how well your REM sleep is regulated at night. This test is an overnight sleep study. It measures your heart rate, breathing, movement, and brain waves.  A multiple sleep latency test (MSLT) to find out how well your REM sleep is regulated during the day. This   is a daytime sleep study. You may need to take several naps during the day. This test also measures your heart rate, breathing, movement, and brain waves. TREATMENT  There is no cure for narcolepsy, but treatment can be very effective in helping manage the condition. Treatment may include:  Lifestyle and sleeping strategies to help cope with the  condition.  Medicines. These may include:  Stimulant medicines to fight daytime sleepiness.  Antidepressant medicines to treat cataplexy.  Sodium oxybate. This is a strong sedative that you take at night. It can help daytime sleepiness and cataplexy. HOME CARE INSTRUCTIONS  Take all medicines as directed by your health care provider.  Follow these sleep practices:  Try to get about 8 hours of sleep every night.  Go to sleep and get up close to the same time every day.  Keep your bedroom dark, quiet, and comfortable.  Schedule short naps for when you feel sleepiest during the day. Tell your employer or teachers that you have narcolepsy. You may be able to adjust your schedule to include time for naps.  Try to get at least 20 minutes of exercise every day. This will help you sleep better at night and reduce daytime sleepiness.  Do not drink alcohol or caffeinated beverages within 4-5 hours of bedtime.  Do not eat a heavy meal before bedtime. Eat at about the same times every day.  Do not smoke.  Do not drive if you are sleepy or have untreated narcolepsy.  Do not swim or go out on the water without a life jacket. SEEK MEDICAL CARE IF: Your medicines are not controlling your narcolepsy symptoms. SEEK IMMEDIATE MEDICAL CARE IF:  You hurt yourself during a sleep attack or an attack of cataplexy.  You have chest pain or trouble breathing. Document Released: 09/10/2002 Document Revised: 02/04/2014 Document Reviewed: 09/12/2013 ExitCare Patient Information 2015 [employer]. This information is not intended to replace advice given to you by your health care provider. Make sure you discuss any questions you have with your health care provider. Sodium Oxybate oral solution What is this medicine? SODIUM OXYBATE (SOE dee um OX i bate) is used to treat excessive sleepiness and cataplexy in patients with narcolepsy. Cataplexy causes a sudden muscle weakness due to a strong emotional  response. This medicine is not available in retail pharmacies. Your doctor will enroll you in a program that will provide the drug to you. This medicine may be used for other purposes; ask your health care provider or pharmacist if you have questions. COMMON BRAND NAME(S): Xyrem What should I tell my health care provider before I take this medicine? They need to know if you have any of these conditions: -depression, psychosis, or other mood disorders -heart disease or high blood pressure -if you frequently drink alcohol containing beverages -if you have a history of drug or alcohol abuse -liver disease -lung disease or difficulty breathing -seizures -succinic semialdehyde dehydrogenase deficiency -thoughts of suicide -an unusual or allergic reaction to sodium oxybate, other medicines, foods, dyes, or preservatives -pregnant or trying to get pregnant -breast-feeding How should I use this medicine? Take this medicine by mouth. Follow the directions on the prescription label. Take this medicine on an empty stomach, at least 30 minutes before or 2 hours after food. Do not take with food. Do not take your medicine more often than directed. A special MedGuide will be given to you by the pharmacist with each prescription and refill. Be sure to read this information carefully each   time. Talk to your pediatrician regarding the use of this medicine in children. Special care may be needed. Overdosage: If you think you have taken too much of this medicine contact a poison control center or emergency room at once. NOTE: This medicine is only for you. Do not share this medicine with others. What if I miss a dose? Skip the missed dose. If it is almost time for your next dose, take only that dose. Allow at least two and one-half hours between each nightly dose. Do not take double or extra doses. What may interact with this medicine? Do not take this medicine with any of the following  medications: -alcohol -barbiturates, like phenobarbital -medicines commonly used for anxiety, sedation or insomnia This medicine may also interact with the following medications: -bupropion -divalproex sodium -dronabinol or marijuana -medicines for psychosis or severe mood disturbances -muscle relaxants -other stimulants, although these are commonly used with sodium oxybate -prescription pain medicines, including tramadol -valproate or valproic acid This list may not describe all possible interactions. Give your health care provider a list of all the medicines, herbs, non-prescription drugs, or dietary supplements you use. Also tell them if you smoke, drink alcohol, or use illegal drugs. Some items may interact with your medicine. What should I watch for while using this medicine? The use of this medicine requires careful supervision. Visit your doctor or health care professional for regular checks on your progress. Do not suddenly stop taking this medicine if you have been taking it for a long time. Withdrawal symptoms may occur. Your doctor or health care professional may need to slowly stop your doses. This medicine may affect your concentration or function. Let your doctor or health care professional know if you have increased sleepiness or confusion during the day. This medicine causes sleep very quickly. You should only take this drug at bedtime, while in bed. Do not drive a car, operate heavy machinery or perform any activities that require mental alertness for at least 6 hours after taking this drug. Use extreme care in any such daily activities until you know how this medicine affects you. Because alcohol may interfere with this medicine and may cause serious side effects, you must avoid alcohol-containing beverages while on this medicine. Do not take this medicine along with sleep medicines or other drugs with strong sedative effects, serious side effects may occur. This medicine can be  dangerous in overdose. If you take more than prescribed or take it by accident, get emergency medical help right away. What side effects may I notice from receiving this medicine? Side effects that you should report to your doctor or health care professional as soon as possible: -allergic reactions like skin rash, itching or hives, swelling of the face, lips, or tongue -breathing problems -confusion -fast, irregular heartbeat -increased blood pressure, particularly if you already have high blood pressure -memory loss -seizures -sleepwalking -tremors or shaking movements -urinary incontinence Side effects that usually do not require medical attention (report to your doctor or health care professional if they continue or are bothersome): -dizziness -drowsiness -headache -increased urination -nausea, vomiting or stomach upset -unusual dreams This list may not describe all possible side effects. Call your doctor for medical advice about side effects. You may report side effects to FDA at 1-800-FDA-1088. Where should I keep my medicine? Keep out of reach of children. This medicine can be abused. Keep your medicine in a safe place to protect it from theft. Do not share this medicine with anyone. Selling or giving   away this medicine is dangerous and against the law. Store at room temperature between 15 and 30 degrees C (59 and 86 degrees F). This medicine may cause accidental overdose and death if it is taken by other adults, children, or pets. Flush any unused medicine down the toilet to reduce the chance of harm. Do not use the medicine after the expiration date. NOTE: This sheet is a summary. It may not cover all possible information. If you have questions about this medicine, talk to your doctor, pharmacist, or health care provider.  2015, Elsevier/Gold Standard. (2013-04-18 15:45:13)  

## 2014-05-22 NOTE — Progress Notes (Signed)
SLEEP MED[facility]INE CL[facility]IC   Provider:  Larey Seat, M D  Referring Provider: Marton Redwood, MD Primary Care Physician:  Marton Redwood, MD  Excessive daytime sleepiness.   HPI:  Christine Cook is a 38 y.o. female , who is seen here as a  revisit  from Dr. Brigitte Pulse for EDS,   Interval history since I were last visit and consult with this patient, on 02-20-14 Mrs. Risdon has undergone a new SPLIT sleep study which confirmed the presence of excessive daytime sleepiness at an Epworth score of 18/24 possible points.  The AHI for this  patient was 38.5 and the RDI 45.3,  the patient was on Effexor at the time of the test and REM latency could not be measured in a reliable fashion. Therefor an MSLT was not done. The heart rate was regular and sinus rhythm and the patient did not produce any periodic limb movements.  The lowest oxygen saturation was 84% this 93.7 minutes of desaturations for the first 2 hours of sleep.  The patient was titrated to 10 cm water CPAP pressure, AHI was significantly reduced and oxygen desaturation resolved. The nadir was now 88% with only 1.1 minute of desaturation time .  5 hours testing time. The patient slept for 94.8 minutes at the 10 cm pressure and recovered 67.5 minutes of REM sleep during that time.   DOWNLOAD: She has been using CPAP since issue date 7-17 through 05-21-49 her average daytime of usage was 4 hours and 5 minutes,  but her residual AHI was still 8.9 under 10 cm water pressure.most apneas were obstructive.  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 There is a strong association between the presents of the HLA DQR  and narcolepsy- in Caucasian patient's about 88%.  The patient started on provigil and  felt only partial improvement at 250 mg, needed Adderall in addition.  We are today discussing to increase the CPAP  pressure to 11 cm water .  We are discussing to start XYREM.    Review of Systems: Out of a complete  14 system review, the patient complains of only the following symptoms, and all other reviewed systems are negative. Sleepiness! Vivid dreams.  sleep paralysis, Dream intrusion into naps.    Epworth score 16  , Fatigue severity score 62   , depression score 3    History   Social History  . Marital Status: Single    Spouse Name: N/A    Number of Children: 1  . Years of Education: College   Occupational History  . Support specialist   .     Social History Main Topics  . Smoking status: Former Smoker -- 0.50 packs/day for 2 years    Types: Cigarettes    Quit date: 02/14/1995  . Smokeless tobacco: Never Used  . Alcohol Use: No  . Drug Use: No  . Sexual Activity: Not on file   Other Topics Concern  . Not on file   Social History Narrative   Patient is single and lives alone.   Patient works at [employer].   Patient has a college education.   Patient has one child.   Patient is right-handed.   Patient drinks two 20 oz of Coke daily.    Family History  Problem Relation Age of Onset  . Hypertension Father   . Coronary artery disease Father   . Diabetes Father   . Asthma Mother   . Asthma Daughter   .  Colon cancer Neg Hx     Past Medical History  Diagnosis Date  . Heart palpitations   . Tachycardia   . Depression   . Heart murmur   . High cholesterol   . Hypertension   . 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 Fatty liver disease, nonalcoholic   . Diabetes   . Migraine   . Anxiety   . Sleep apnea with hypersomnolence     CPAP study 11-28-09,  10-02-2009 AHI was 16.5, pressure to   . Irritable bowel syndrome with diarrhea   . 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 Tonsillectomy    . Adenoidectomy    . Cholecystectomy    . Knee surgery      Left knee x 2   . Bladder surgery      Current Outpatient Prescriptions  Medication Sig Dispense Refill  . amphetamine-dextroamphetamine (ADDERALL)  10 MG tablet Take 1 tablet (10 mg total) by mouth every morning.  90 tablet  0  . ARIPiprazole (ABILIFY) 5 MG tablet Take 5 mg by mouth daily.      . Armodafinil 250 MG tablet Take 1 tablet (250 mg total) by mouth every morning.  30 tablet  5  . atenolol (TENORMIN) 50 MG tablet Take 50 mg by mouth at bedtime.      . clidinium-chlordiazePOXIDE (LIBRAX) 5-2.5 MG per capsule Take one before meals prn  90 capsule  3  . diphenoxylate-atropine (LOMOTIL) 2.5-0.025 MG per tablet Take 1 tablet by mouth 2 (two) times daily.  60 tablet  3  . glycopyrrolate (ROBINUL) 2 MG tablet Take 1 tablet (2 mg total) by mouth 2 (two) times daily.  60 tablet  3  . ondansetron (ZOFRAN) 4 MG tablet Take 1 tab every 6 hours as needed for cramping, spasms diarrhea.  40 tablet  2  . pantoprazole (PROTONIX) 40 MG tablet Take 40 mg by mouth 2 (two) times daily.      . Probiotic Product (ALIGN) 4 MG CAPS Take 1 capsule by mouth daily.      Marland Kitchen venlafaxine XR (EFFEXOR-XR) 150 MG 24 hr capsule Take 300 mg by mouth daily with breakfast.       No current facility-administered medications for this visit.    Allergies as of 05/22/2014 - Review Complete 05/22/2014  Allergen Reaction Noted  . Codeine  01/21/2009  . Morphine  01/21/2009  . Neurontin [gabapentin] Other (See Comments) 02/14/2012    Vitals: BP 127/85  Pulse 91  Resp 17  Ht _0  (1.676 m)  Wt 183 lb (83.008 kg)  BMI 29.55 kg/m2 Last Weight:  Wt Readings from Last 1 Encounters:  05/22/14 183 lb (83.008 kg)       Last Height:   Ht Readings from Last 1 Encounters:  05/22/14 _1  (1.676 m)    Physical exam:   Physical exam:  General: The patient is awake, alert and appears not in acute distress. The patient is well groomed. Head: Normocephalic, atraumatic.  Neck is supple. Mallampati 3 , neck circumference:16.5 inches , and no TMJ,  there is a remarkable disproportion between neck size and body habit.  Retrognathia.  Cardiovascular:  Regular rate and  rhythm, without  murmurs or carotid bruit, and without distended neck veins. Respiratory: Lungs are clear to auscultation. Skin:  Without evidence of edema, or rash Trunk: BMI is elevated, but not severely obese,  and  patient has truncal and neck obesity.   Neurologic exam : The patient is awake and alert, oriented to place and time.  Memory subjective  described as intact.  Her attention is less limited by  fatigue.  There is a normal attention span & concentration ability. Speech is fluent without  dysarthria, dysphonia or aphasia.  Mood and affect are appropriate. Denies depression.   Cranial nerves: Pupils are equal and briskly reactive to light. Funduscopic exam without  evidence of pallor or edema. Extraocular movements  in vertical and horizontal planes intact and without nystagmus. Visual fields by finger perimetry are intact. Hearing to finger rub intact.  Facial sensation intact to fine touch. Facial motor strength is symmetric and tongue and uvula move midline.  Motor exam:  Normal tone , muscle bulk and strength in all extremities.  Sensory:  Fine touch, pinprick and vibration were normal in ankle and toes and hands. Proprioception is tested in the upper extremities normal.  Coordination: Rapid alternating movements in the fingers/hands is tested and normal. Finger-to-nose maneuver tested and normal without evidence of ataxia, dysmetria or tremor.  Gait and station: Patient walks without assistive device . She feels clumsy, but she had no falls.  Deep tendon reflexes: in the  upper and lower extremities are symmetric and intact.  Babinski maneuver response is downgoing.    Assessment:  After physical and neurologic examination, review of laboratory studies, imaging, neurophysiology testing and pre-existing records, assessment is  Two sleep disorders are present: 1) OSA , severe with hypoxemia while  untreated. AHI was 48/hr of sleep . Corrected to AHI 8.9 on CPAP pressure of  10 cm water, will increase to 11 cm water.  2) Narcolepsy , positive HLA, Epworth remained 16 on Modafinil, will start XYREM.  Obtain LFT, TSH and CBC diff.    The patient was advised of the nature of the diagnosed sleep disorder , the treatment options and risks for general a health and wellness arising from not treating the condition. Visit duration was 45 minutes.   Plan:  Treatment plan and additional workup :  XYREM education.  CPAP compliance established. 4.05 hours nightly for the last 30 days. 60% of all days over 4 hours, this will improve on XYREM.      Asencion Partridge Nicholad Kautzman MD  05/22/2014

## 2014-05-23 LAB — CBC WITH DIFFERENTIAL/PLATELET
Basophils Absolute: 0 10*3/uL (ref 0.0–0.2)
Basos: 0 %
Eos: 2 %
Eosinophils Absolute: 0.2 10*3/uL (ref 0.0–0.4)
HCT: 41.7 % (ref 34.0–46.6)
Hemoglobin: 13.4 g/dL (ref 11.1–15.9)
Immature Grans (Abs): 0 10*3/uL (ref 0.0–0.1)
Immature Granulocytes: 0 %
Lymphocytes Absolute: 1.8 10*3/uL (ref 0.7–3.1)
Lymphs: 22 %
MCH: 26.5 pg — ABNORMAL LOW (ref 26.6–33.0)
MCHC: 32.1 g/dL (ref 31.5–35.7)
MCV: 83 fL (ref 79–97)
Monocytes Absolute: 0.6 10*3/uL (ref 0.1–0.9)
Monocytes: 7 %
Neutrophils Absolute: 5.7 10*3/uL (ref 1.4–7.0)
Neutrophils Relative %: 69 %
RBC: 5.05 x10E6/uL (ref 3.77–5.28)
RDW: 14.8 % (ref 12.3–15.4)
WBC: 8.3 10*3/uL (ref 3.4–10.8)

## 2014-05-23 LAB — COMPREHENSIVE METABOLIC PANEL
ALT: 16 IU/L (ref 0–32)
AST: 12 IU/L (ref 0–40)
Albumin/Globulin Ratio: 2.1 (ref 1.1–2.5)
Albumin: 4.4 g/dL (ref 3.5–5.5)
Alkaline Phosphatase: 89 IU/L (ref 39–117)
BUN/Creatinine Ratio: 8 (ref 8–20)
BUN: 6 mg/dL (ref 6–20)
CO2: 26 mmol/L (ref 18–29)
Calcium: 9.5 mg/dL (ref 8.7–10.2)
Chloride: 97 mmol/L (ref 97–108)
Creatinine, Ser: 0.79 mg/dL (ref 0.57–1.00)
GFR calc Af Amer: 110 mL/min/{1.73_m2} (ref >59)
GFR calc non Af Amer: 95 mL/min/{1.73_m2} (ref >59)
Globulin, Total: 2.1 g/dL (ref 1.5–4.5)
Glucose: 119 mg/dL — ABNORMAL HIGH (ref 65–99)
Potassium: 3.3 mmol/L — ABNORMAL LOW (ref 3.5–5.2)
Sodium: 140 mmol/L (ref 134–144)
Total Bilirubin: 0.4 mg/dL (ref 0.0–1.2)
Total Protein: 6.5 g/dL (ref 6.0–8.5)

## 2014-05-23 LAB — TSH: TSH: 2.49 u[IU]/mL (ref 0.450–4.500)

## 2014-05-29 ENCOUNTER — Encounter: Payer: Self-pay | Admitting: Internal Medicine

## 2014-05-31 ENCOUNTER — Telehealth: Payer: Self-pay | Admitting: *Deleted

## 2014-05-31 NOTE — Progress Notes (Signed)
Left a vm for the patient to call the office to go over her lab results.

## 2014-05-31 NOTE — Telephone Encounter (Signed)
I called the patient back.  Explained Xyrem is still in process.  Patient verbalized understanding.

## 2014-05-31 NOTE — Telephone Encounter (Signed)
Message copied by Fran Lowes on Fri May 31, 2014  4:01 PM ------      Message from: [facility], CARMEN      Created: Fri May 31, 2014  1:14 PM       Low potassium, please supplement diet with banana , coconut water and dried fruit , hydrate well. Take multivitamin. CD ------

## 2014-05-31 NOTE — Telephone Encounter (Signed)
Patient was given her lab results and patient states she has not heard anything concerning her Xyrem?

## 2014-05-31 NOTE — Telephone Encounter (Signed)
Left a vm for the patient to call the office to go over lab results.

## 2014-06-04 ENCOUNTER — Telehealth: Payer: Self-pay | Admitting: Neurology

## 2014-06-04 NOTE — Telephone Encounter (Signed)
Erin from [facility] calling to request that we fax them a REMM script form for patient's Xyrem, please return call and advise.

## 2014-06-04 NOTE — Telephone Encounter (Signed)
Form has been re-faxed.  Xyrem will follow up with patient once they finish processing info.  (The just changed their processing system last week)

## 2014-06-06 ENCOUNTER — Telehealth: Payer: Self-pay | Admitting: Neurology

## 2014-06-06 NOTE — Telephone Encounter (Signed)
Spoke to Judson Roch from [facility] and she requested a new presciption for Xyrem on the new [facility] form.  Their fax is [phone].

## 2014-06-06 NOTE — Telephone Encounter (Signed)
I called back spoke with Mashawnda (? On spelling).  The program has changed their enrollment process and they do have the new enrollment form on file for the patient.  They are faxing a new prescription form for our records.  Her extension is [phone] should we have any further questions.

## 2014-06-07 ENCOUNTER — Telehealth: Payer: Self-pay | Admitting: Neurology

## 2014-06-07 NOTE — Telephone Encounter (Signed)
Juliann Pulse from [facility] calling to check on the status of Xyrem enrollment form for patient, it was faxed on 8/28 and they haven't received anything, please call and advise.

## 2014-06-07 NOTE — Telephone Encounter (Signed)
The enrollment form has already been faxed.  I called back.  Christine Cook was not available.  Left message.

## 2014-06-13 ENCOUNTER — Telehealth: Payer: Self-pay

## 2014-06-13 NOTE — Telephone Encounter (Signed)
[facility] notified us they have approved our request for coverage on Xyrem effective until the policy changes or the plan is terminated Ref # [id number]

## 2014-07-03 ENCOUNTER — Ambulatory Visit: Payer: BC Managed Care – PPO | Admitting: Adult Health

## 2014-08-03 ENCOUNTER — Other Ambulatory Visit: Payer: Self-pay | Admitting: Internal Medicine

## 2014-08-05 ENCOUNTER — Other Ambulatory Visit: Payer: Self-pay | Admitting: Neurology

## 2014-08-05 MED ORDER — AMPHETAMINE-DEXTROAMPHETAMINE 10 MG PO TABS: 10.0000 mg | ORAL_TABLET | Freq: Every morning | ORAL | Status: DC

## 2014-08-05 NOTE — Telephone Encounter (Signed)
Patient requesting refill of Adderall, states that she will be in [place] on 08/08/14 and would like to pick it up then, please call when ready for pick up.

## 2014-08-05 NOTE — Telephone Encounter (Signed)
Request entered, forwarded to provider for approval.  

## 2014-08-06 NOTE — Telephone Encounter (Signed)
I called the patient to let them know their Rx for Adderall was ready for pickup. Patient was instructed to bring Photo ID. 

## 2014-08-13 ENCOUNTER — Ambulatory Visit: Payer: BC Managed Care – PPO | Admitting: Adult Health

## 2014-08-18 ENCOUNTER — Telehealth: Payer: Self-pay

## 2014-08-18 NOTE — Telephone Encounter (Signed)
[facility] notified us they have approved the request for coverage on Adderall effective until 08/13/2015 Ref # [id number]

## 2014-08-30 ENCOUNTER — Other Ambulatory Visit: Payer: Self-pay | Admitting: Neurology

## 2014-09-02 ENCOUNTER — Other Ambulatory Visit: Payer: Self-pay

## 2014-09-02 DIAGNOSIS — G47419 Narcolepsy without cataplexy: Secondary | ICD-10-CM

## 2014-09-02 MED ORDER — ARMODAFINIL 250 MG PO TABS: 250.0000 mg | ORAL_TABLET | ORAL | Status: DC

## 2014-09-02 NOTE — Telephone Encounter (Signed)
Rx signed and faxed.

## 2014-10-01 ENCOUNTER — Other Ambulatory Visit: Payer: Self-pay | Admitting: Internal Medicine

## 2014-10-15 ENCOUNTER — Ambulatory Visit: Payer: BC Managed Care – PPO | Admitting: Adult Health

## 2014-12-09 ENCOUNTER — Other Ambulatory Visit: Payer: Self-pay | Admitting: Internal Medicine

## 2015-02-13 ENCOUNTER — Encounter: Payer: Self-pay | Admitting: Internal Medicine

## 2016-01-16 ENCOUNTER — Encounter: Payer: Self-pay | Admitting: *Deleted

## 2016-01-16 ENCOUNTER — Ambulatory Visit
Admission: EM | Admit: 2016-01-16 | Discharge: 2016-01-16 | Disposition: A | Discharge status: Home / Self Care | Payer: BC Managed Care – PPO | Attending: Family Medicine | Admitting: Family Medicine

## 2016-01-16 DIAGNOSIS — H1131 Conjunctival hemorrhage, right eye: Secondary | ICD-10-CM

## 2016-01-16 DIAGNOSIS — I10 Essential (primary) hypertension: Secondary | ICD-10-CM | POA: Diagnosis not present

## 2016-01-16 DIAGNOSIS — E119 Type 2 diabetes mellitus without complications: Secondary | ICD-10-CM | POA: Diagnosis not present

## 2016-01-16 LAB — CBC WITH DIFFERENTIAL/PLATELET
Basophils Absolute: 0.1 10*3/uL (ref 0–0.1)
Basophils Relative: 1 %
Eosinophils Absolute: 0.3 10*3/uL (ref 0–0.7)
Eosinophils Relative: 3 %
HCT: 42.4 % (ref 35.0–47.0)
Hemoglobin: 13.9 g/dL (ref 12.0–16.0)
Lymphocytes Relative: 24 %
Lymphs Abs: 2.3 10*3/uL (ref 1.0–3.6)
MCH: 26.4 pg (ref 26.0–34.0)
MCHC: 32.7 g/dL (ref 32.0–36.0)
MCV: 80.8 fL (ref 80.0–100.0)
Monocytes Absolute: 0.6 10*3/uL (ref 0.2–0.9)
Monocytes Relative: 6 %
Neutro Abs: 6.6 10*3/uL — ABNORMAL HIGH (ref 1.4–6.5)
Neutrophils Relative %: 66 %
Platelets: 276 10*3/uL (ref 150–440)
RBC: 5.25 MIL/uL — ABNORMAL HIGH (ref 3.80–5.20)
RDW: 15.5 % — ABNORMAL HIGH (ref 11.5–14.5)
WBC: 9.8 10*3/uL (ref 3.6–11.0)

## 2016-01-16 LAB — COMPREHENSIVE METABOLIC PANEL
ALT: 15 U/L (ref 14–54)
AST: 22 U/L (ref 15–41)
Albumin: 3.9 g/dL (ref 3.5–5.0)
Alkaline Phosphatase: 75 U/L (ref 38–126)
Anion gap: 6 (ref 5–15)
BUN: 10 mg/dL (ref 6–20)
CO2: 22 mmol/L (ref 22–32)
Calcium: 8.5 mg/dL — ABNORMAL LOW (ref 8.9–10.3)
Chloride: 106 mmol/L (ref 101–111)
Creatinine, Ser: 0.7 mg/dL (ref 0.44–1.00)
GFR calc Af Amer: >60 mL/min (ref >60)
GFR calc non Af Amer: >60 mL/min (ref >60)
Glucose, Bld: 180 mg/dL — ABNORMAL HIGH (ref 65–99)
Potassium: 3.3 mmol/L — ABNORMAL LOW (ref 3.5–5.1)
Sodium: 134 mmol/L — ABNORMAL LOW (ref 135–145)
Total Bilirubin: 0.7 mg/dL (ref 0.3–1.2)
Total Protein: 7.1 g/dL (ref 6.5–8.1)

## 2016-01-16 LAB — GLUCOSE, CAPILLARY: Glucose-Capillary: 154 mg/dL — ABNORMAL HIGH (ref 65–99)

## 2016-01-16 MED ORDER — LISINOPRIL 20 MG PO TABS: 20.0000 mg | ORAL_TABLET | Freq: Every day | ORAL | Status: DC

## 2016-01-16 NOTE — ED Provider Notes (Signed)
CSN: [id number]     Arrival date & time 01/16/16  1013 History   First MD Initiated Contact with Patient 01/16/16 1139     Chief Complaint  Patient presents with  . Eye Problem   (Consider location/radiation/quality/duration/timing/severity/associated sxs/prior Treatment) HPI Comments: 40 yo female with a c/o right eye redness since yesterday. Denies any injury/trauma, drainage, vision changes. Also states she's been out of her blood pressure medication. Has not seen a PCP in over 1 year.   The history is provided by the patient.    Past Medical History  Diagnosis Date  . Heart palpitations   . Tachycardia   . Depression   . Heart murmur   . High cholesterol   . Hypertension   . 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 Fatty liver disease, nonalcoholic   . Diabetes ([place])   . Migraine   . Anxiety   . Sleep apnea with hypersomnolence     CPAP study 11-28-09,  10-02-2009 AHI was 16.5, pressure to   . Irritable bowel syndrome with diarrhea   . 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 Tonsillectomy    . Adenoidectomy    . Cholecystectomy    . Knee surgery      Left knee x 2   . Bladder surgery     Family History  Problem Relation Age of Onset  . Hypertension Father   . Coronary artery disease Father   . Diabetes Father   . Asthma Mother   . Asthma Daughter   . Colon cancer Neg Hx    Social History  Substance Use Topics  . Smoking status: Former Smoker -- 0.50 packs/day for 2 years    Types: Cigarettes    Quit date: 02/14/1995  . Smokeless tobacco: Never Used  . Alcohol Use: No   OB History    No data available     Review of Systems  Allergies  Codeine; Morphine; and Neurontin  Home Medications   Prior to Admission medications   Medication Sig Start Date End Date Taking? Authorizing Provider  pantoprazole (PROTONIX) 40 MG tablet Take 40 mg by  mouth 2 (two) times daily.   Yes Historical Provider, MD  venlafaxine XR (EFFEXOR-XR) 150 MG 24 hr capsule Take 300 mg by mouth daily with breakfast.   Yes Historical Provider, MD  amphetamine-dextroamphetamine (ADDERALL) 10 MG tablet Take 1 tablet (10 mg total) by mouth every morning. 08/05/14   Asencion Partridge Dohmeier, MD  ARIPiprazole (ABILIFY) 5 MG tablet Take 5 mg by mouth daily.    Historical Provider, MD  Armodafinil 250 MG tablet Take 1 tablet (250 mg total) by mouth every morning. 09/02/14   Larey Seat, MD  atenolol (TENORMIN) 50 MG tablet Take 50 mg by mouth at bedtime.    Historical Provider, MD  diphenoxylate-atropine (LOMOTIL) 2.5-0.025 MG per tablet TAKE ONE TABLET BY MOUTH TWICE DAILY 12/09/14   Irene Shipper, MD  glycopyrrolate (ROBINUL) 2 MG tablet Take 1 tablet (2 mg total) by mouth 2 (two) times daily. 12/11/13   Amy S Esterwood, PA-C  lisinopril (PRINIVIL,ZESTRIL) 20 MG tablet Take 1 tablet (20 mg total) by mouth daily. 01/16/16   Norval Gable, MD  ondansetron (ZOFRAN) 4 MG tablet Take 1 tab every 6 hours as needed for cramping, spasms diarrhea. 12/11/13   Amy S Esterwood, PA-C  Probiotic Product (ALIGN) 4 MG CAPS  Take 1 capsule by mouth daily.    Historical Provider, MD  Sodium Oxybate 500 MG/ML SOLN Starter dose of XYREM, 2.25 gram twice at night for 7 days, than 3 gram 7 days , two doses at night. Than 4.5 gram final dose , twice nightly. 05/22/14   Larey Seat, MD   Meds Ordered and Administered this Visit  Medications - No data to display  BP 161/100 mmHg  Pulse 88  Temp(Src) 97.9 F (36.6 C) (Oral)  Resp 16  Ht 5\' 5"  (1.651 m)  Wt 170 lb (77.111 kg)  BMI 28.29 kg/m2  SpO2 100%  LMP 01/13/2016 (Approximate) No data found.   Physical Exam  Constitutional: She appears well-developed and well-nourished. No distress.  HENT:  Head: Normocephalic and atraumatic.  Nose: Mucosal edema and rhinorrhea present. No nose lacerations, sinus tenderness, nasal deformity, septal  deviation or nasal septal hematoma. No epistaxis.  No foreign bodies.  Mouth/Throat: Uvula is midline, oropharynx is clear and moist and mucous membranes are normal. No oropharyngeal exudate.  Eyes: EOM are normal. Pupils are equal, round, and reactive to light. Right eye exhibits no discharge. Left eye exhibits no discharge. Right conjunctiva is not injected. Right conjunctiva has a hemorrhage (subconjunctival). Left conjunctiva is not injected. No scleral icterus.  Neck: Normal range of motion. Neck supple. No thyromegaly present.  Cardiovascular: Normal rate, regular rhythm and normal heart sounds.   Pulmonary/Chest: Effort normal and breath sounds normal. No respiratory distress. She has no wheezes. She has no rales.  Lymphadenopathy:    She has no cervical adenopathy.  Skin: She is not diaphoretic.  Nursing note and vitals reviewed.   ED Course  Procedures (including critical care time)  Labs Review Labs Reviewed  GLUCOSE, CAPILLARY - Abnormal; Notable for the following:    Glucose-Capillary 154 (*)    All other components within normal limits  CBC WITH DIFFERENTIAL/PLATELET - Abnormal; Notable for the following:    RBC 5.25 (*)    RDW 15.5 (*)    Neutro Abs 6.6 (*)    All other components within normal limits  COMPREHENSIVE METABOLIC PANEL - Abnormal; Notable for the following:    Sodium 134 (*)    Potassium 3.3 (*)    Glucose, Bld 180 (*)    Calcium 8.5 (*)    All other components within normal limits  CBG MONITORING, ED    Imaging Review No results found.   Visual Acuity Review  Right Eye Distance: 20/25 Left Eye Distance: 20/50 Bilateral Distance: 20/25  Right Eye Near:   Left Eye Near:    Bilateral Near:         MDM   1. Essential hypertension   2. Type 2 diabetes mellitus treated without insulin (HCC)   3. Subconjunctival hemorrhage, right    Discharge Medication List as of 01/16/2016  1:00 PM    START taking these medications   Details   lisinopril (PRINIVIL,ZESTRIL) 20 MG tablet Take 1 tablet (20 mg total) by mouth daily., Starting 01/16/2016, Until Discontinued, Print       1. Labs/x-ray results and diagnosis reviewed with patient/parent/guardian/family 2. rx as per orders above; reviewed possible side effects, interactions, risks and benefits  3. Establish care with a PCP for continuing chronic health management   Norval Gable, MD 01/30/16 [phone]

## 2016-01-16 NOTE — Discharge Instructions (Signed)
Diabetes and Exercise Exercising regularly is important. It is not just about losing weight. It has many health benefits, such as:  Improving your overall fitness, flexibility, and endurance.  Increasing your bone density.  Helping with weight control.  Decreasing your body fat.  Increasing your muscle strength.  Reducing stress and tension.  Improving your overall health. People with diabetes who exercise gain additional benefits because exercise:  Reduces appetite.  Improves the body's use of blood sugar (glucose).  Helps lower or control blood glucose.  Decreases blood pressure.  Helps control blood lipids (such as cholesterol and triglycerides).  Improves the body's use of the hormone insulin by:  Increasing the body's insulin sensitivity.  Reducing the body's insulin needs.  Decreases the risk for heart disease because exercising:  Lowers cholesterol and triglycerides levels.  Increases the levels of good cholesterol (such as high-density lipoproteins [HDL]) in the body.  Lowers blood glucose levels. YOUR ACTIVITY PLAN  Choose an activity that you enjoy, and set realistic goals. To exercise safely, you should begin practicing any new physical activity slowly, and gradually increase the intensity of the exercise over time. Your health care provider or diabetes educator can help create an activity plan that works for you. General recommendations include:  Encouraging children to engage in at least 60 minutes of physical activity each day.  Stretching and performing strength training exercises, such as yoga or weight lifting, at least 2 times per week.  Performing a total of at least 150 minutes of moderate-intensity exercise each week, such as brisk walking or water aerobics.  Exercising at least 3 days per week, making sure you allow no more than 2 consecutive days to pass without exercising.  Avoiding long periods of inactivity (90 minutes or more). When you  have to spend an extended period of time sitting down, take frequent breaks to walk or stretch. RECOMMENDATIONS FOR EXERCISING WITH TYPE 1 OR TYPE 2 DIABETES   Check your blood glucose before exercising. If blood glucose levels are greater than 240 mg/dL, check for urine ketones. Do not exercise if ketones are present.  Avoid injecting insulin into areas of the body that are going to be exercised. For example, avoid injecting insulin into:  The arms when playing tennis.  The legs when jogging.  Keep a record of:  Food intake before and after you exercise.  Expected peak times of insulin action.  Blood glucose levels before and after you exercise.  The type and amount of exercise you have done.  Review your records with your health care provider. Your health care provider will help you to develop guidelines for adjusting food intake and insulin amounts before and after exercising.  If you take insulin or oral hypoglycemic agents, watch for signs and symptoms of hypoglycemia. They include:  Dizziness.  Shaking.  Sweating.  Chills.  Confusion.  Drink plenty of water while you exercise to prevent dehydration or heat stroke. Body water is lost during exercise and must be replaced.  Talk to your health care provider before starting an exercise program to make sure it is safe for you. Remember, almost any type of activity is better than none.   This information is not intended to replace advice given to you by your health care provider. Make sure you discuss any questions you have with your health care provider.   Document Released: 12/11/2003 Document Revised: 02/04/2015 Document Reviewed: 02/27/2013 Elsevier Interactive Patient Education 2016 [employer]. Diabetes Mellitus and Food It is important for  you to manage your blood sugar (glucose) level. Your blood glucose level can be greatly affected by what you eat. Eating healthier foods in the appropriate amounts throughout  the day at about the same time each day will help you control your blood glucose level. It can also help slow or prevent worsening of your diabetes mellitus. Healthy eating may even help you improve the level of your blood pressure and reach or maintain a healthy weight.  General recommendations for healthful eating and cooking habits include:  Eating meals and snacks regularly. Avoid going long periods of time without eating to lose weight.  Eating a diet that consists mainly of plant-based foods, such as fruits, vegetables, nuts, legumes, and whole grains.  Using low-heat cooking methods, such as baking, instead of high-heat cooking methods, such as deep frying. Work with your dietitian to make sure you understand how to use the Nutrition Facts information on food labels. HOW CAN FOOD AFFECT ME? Carbohydrates Carbohydrates affect your blood glucose level more than any other type of food. Your dietitian will help you determine how many carbohydrates to eat at each meal and teach you how to count carbohydrates. Counting carbohydrates is important to keep your blood glucose at a healthy level, especially if you are using insulin or taking certain medicines for diabetes mellitus. Alcohol Alcohol can cause sudden decreases in blood glucose (hypoglycemia), especially if you use insulin or take certain medicines for diabetes mellitus. Hypoglycemia can be a life-threatening condition. Symptoms of hypoglycemia (sleepiness, dizziness, and disorientation) are similar to symptoms of having too much alcohol.  If your health care provider has given you approval to drink alcohol, do so in moderation and use the following guidelines:  Women should not have more than one drink per day, and men should not have more than two drinks per day. One drink is equal to:  12 oz of beer.  5 oz of wine.  1 oz of hard liquor.  Do not drink on an empty stomach.  Keep yourself hydrated. Have water, diet soda, or  unsweetened iced tea.  Regular soda, juice, and other mixers might contain a lot of carbohydrates and should be counted. WHAT FOODS ARE NOT RECOMMENDED? As you make food choices, it is important to remember that all foods are not the same. Some foods have fewer nutrients per serving than other foods, even though they might have the same number of calories or carbohydrates. It is difficult to get your body what it needs when you eat foods with fewer nutrients. Examples of foods that you should avoid that are high in calories and carbohydrates but low in nutrients include:  Trans fats (most processed foods list trans fats on the Nutrition Facts label).  Regular soda.  Juice.  Candy.  Sweets, such as cake, pie, doughnuts, and cookies.  Fried foods. WHAT FOODS CAN I EAT? Eat nutrient-rich foods, which will nourish your body and keep you healthy. The food you should eat also will depend on several factors, including:  The calories you need.  The medicines you take.  Your weight.  Your blood glucose level.  Your blood pressure level.  Your cholesterol level. You should eat a variety of foods, including:  Protein.  Lean cuts of meat.  Proteins low in saturated fats, such as fish, egg whites, and beans. Avoid processed meats.  Fruits and vegetables.  Fruits and vegetables that may help control blood glucose levels, such as apples, mangoes, and yams.  Dairy products.  Choose fat-free  or low-fat dairy products, such as milk, yogurt, and cheese.  Grains, bread, pasta, and rice.  Choose whole grain products, such as multigrain bread, whole oats, and brown rice. These foods may help control blood pressure.  Fats.  Foods containing healthful fats, such as nuts, avocado, olive oil, canola oil, and fish. DOES EVERYONE WITH DIABETES MELLITUS HAVE THE SAME MEAL PLAN? Because every person with diabetes mellitus is different, there is not one meal plan that works for everyone. It  is very important that you meet with a dietitian who will help you create a meal plan that is just right for you.   This information is not intended to replace advice given to you by your health care provider. Make sure you discuss any questions you have with your health care provider.   Document Released: 06/17/2005 Document Revised: 10/11/2014 Document Reviewed: 08/17/2013 Elsevier Interactive Patient Education 2016 [employer] Subconjunctival Hemorrhage Subconjunctival hemorrhage is bleeding that happens between the white part of your eye (sclera) and the clear membrane that covers the outside of your eye (conjunctiva). There are many tiny blood vessels near the surface of your eye. A subconjunctival hemorrhage happens when one or more of these vessels breaks and bleeds, causing a red patch to appear on your eye. This is similar to a bruise. Depending on the amount of bleeding, the red patch may only cover a small area of your eye or it may cover the entire visible part of the sclera. If a lot of blood collects under the conjunctiva, there may also be swelling. Subconjunctival hemorrhages do not affect your vision or cause pain, but your eye may feel irritated if there is swelling. Subconjunctival hemorrhages usually do not require treatment, and they disappear on their own within two weeks. CAUSES This condition may be caused by:  Mild trauma, such as rubbing your eye too hard.  Severe trauma or blunt injuries.  Coughing, sneezing, or vomiting.  Straining, such as when lifting a heavy object.  High blood pressure.  Recent eye surgery.  A history of diabetes.  Certain medicines, especially blood thinners (anticoagulants).  Other conditions, such as eye tumors, bleeding disorders, or blood vessel abnormalities. Subconjunctival hemorrhages can happen without an obvious cause.  SYMPTOMS  Symptoms of this condition include:  A bright red or dark red patch on the white part of the  eye.  The red area may spread out to cover a larger area of the eye before it goes away.  The red area may turn brownish-yellow before it goes away.  Swelling.  Mild eye irritation. DIAGNOSIS This condition is diagnosed with a physical exam. If your subconjunctival hemorrhage was caused by trauma, your health care provider may refer you to an eye specialist (ophthalmologist) or another specialist to check for other injuries. You may have other tests, including:  An eye exam.  A blood pressure check.  Blood tests to check for bleeding disorders. If your subconjunctival hemorrhage was caused by trauma, X-rays or a CT scan may be done to check for other injuries. TREATMENT Usually, no treatment is needed. Your health care provider may recommend eye drops or cold compresses to help with discomfort. HOME CARE INSTRUCTIONS  Take over-the-counter and prescription medicines only as directed by your health care provider.  Use eye drops or cold compresses to help with discomfort as directed by your health care provider.  Avoid activities, things, and environments that may irritate or injure your eye.  Keep all follow-up visits as told by  your health care provider. This is important. SEEK MEDICAL CARE IF:  You have pain in your eye.  The bleeding does not go away within 3 weeks.  You keep getting new subconjunctival hemorrhages. SEEK IMMEDIATE MEDICAL CARE IF:  Your vision changes or you have difficulty seeing.  You suddenly develop severe sensitivity to light.  You develop a severe headache, persistent vomiting, confusion, or abnormal tiredness (lethargy).  Your eye seems to bulge or protrude from your eye socket.  You develop unexplained bruises on your body.  You have unexplained bleeding in another area of your body.   This information is not intended to replace advice given to you by your health care provider. Make sure you discuss any questions you have with your health  care provider.   Document Released: 09/20/2005 Document Revised: 06/11/2015 Document Reviewed: 11/27/2014 Elsevier Interactive Patient Education [employer].

## 2016-01-16 NOTE — ED Notes (Signed)
Awoke yesterday with right eye redness (conjunctiva), worse today. Denies injury. Has had a similar episode of this type problem (same eye) 4 months ago.

## 2016-07-09 ENCOUNTER — Encounter: Payer: Self-pay | Admitting: Emergency Medicine

## 2016-07-09 ENCOUNTER — Emergency Department: Payer: Self-pay

## 2016-07-09 ENCOUNTER — Inpatient Hospital Stay
Admission: EM | Admit: 2016-07-09 | Discharge: 2016-07-11 | DRG: 917 | Disposition: A | Discharge status: Home / Self Care | Payer: Self-pay | Attending: Internal Medicine | Admitting: Internal Medicine

## 2016-07-09 DIAGNOSIS — F329 Major depressive disorder, single episode, unspecified: Secondary | ICD-10-CM | POA: Diagnosis present

## 2016-07-09 DIAGNOSIS — G43909 Migraine, unspecified, not intractable, without status migrainosus: Secondary | ICD-10-CM | POA: Diagnosis present

## 2016-07-09 DIAGNOSIS — T50902A Poisoning by unspecified drugs, medicaments and biological substances, intentional self-harm, initial encounter: Secondary | ICD-10-CM

## 2016-07-09 DIAGNOSIS — Z6829 Body mass index (BMI) 29.0-29.9, adult: Secondary | ICD-10-CM

## 2016-07-09 DIAGNOSIS — K580 Irritable bowel syndrome with diarrhea: Secondary | ICD-10-CM | POA: Diagnosis present

## 2016-07-09 DIAGNOSIS — Z91018 Allergy to other foods: Secondary | ICD-10-CM

## 2016-07-09 DIAGNOSIS — E1165 Type 2 diabetes mellitus with hyperglycemia: Secondary | ICD-10-CM | POA: Diagnosis present

## 2016-07-09 DIAGNOSIS — F630 Pathological gambling: Secondary | ICD-10-CM | POA: Diagnosis present

## 2016-07-09 DIAGNOSIS — D490 Neoplasm of unspecified behavior of digestive system: Secondary | ICD-10-CM | POA: Diagnosis present

## 2016-07-09 DIAGNOSIS — T424X1A Poisoning by benzodiazepines, accidental (unintentional), initial encounter: Secondary | ICD-10-CM

## 2016-07-09 DIAGNOSIS — E669 Obesity, unspecified: Secondary | ICD-10-CM | POA: Diagnosis present

## 2016-07-09 DIAGNOSIS — T424X2A Poisoning by benzodiazepines, intentional self-harm, initial encounter: Principal | ICD-10-CM

## 2016-07-09 DIAGNOSIS — I10 Essential (primary) hypertension: Secondary | ICD-10-CM | POA: Diagnosis present

## 2016-07-09 DIAGNOSIS — E876 Hypokalemia: Secondary | ICD-10-CM | POA: Diagnosis present

## 2016-07-09 DIAGNOSIS — K219 Gastro-esophageal reflux disease without esophagitis: Secondary | ICD-10-CM | POA: Diagnosis present

## 2016-07-09 DIAGNOSIS — E119 Type 2 diabetes mellitus without complications: Secondary | ICD-10-CM

## 2016-07-09 DIAGNOSIS — Z888 Allergy status to other drugs, medicaments and biological substances status: Secondary | ICD-10-CM

## 2016-07-09 DIAGNOSIS — G4733 Obstructive sleep apnea (adult) (pediatric): Secondary | ICD-10-CM | POA: Diagnosis present

## 2016-07-09 DIAGNOSIS — Z885 Allergy status to narcotic agent status: Secondary | ICD-10-CM

## 2016-07-09 DIAGNOSIS — Z87891 Personal history of nicotine dependence: Secondary | ICD-10-CM

## 2016-07-09 DIAGNOSIS — F419 Anxiety disorder, unspecified: Secondary | ICD-10-CM | POA: Diagnosis present

## 2016-07-09 DIAGNOSIS — Z79899 Other long term (current) drug therapy: Secondary | ICD-10-CM

## 2016-07-09 DIAGNOSIS — G9340 Encephalopathy, unspecified: Secondary | ICD-10-CM

## 2016-07-09 DIAGNOSIS — K760 Fatty (change of) liver, not elsewhere classified: Secondary | ICD-10-CM | POA: Diagnosis present

## 2016-07-09 DIAGNOSIS — G92 Toxic encephalopathy: Secondary | ICD-10-CM | POA: Diagnosis present

## 2016-07-09 DIAGNOSIS — E7800 Pure hypercholesterolemia, unspecified: Secondary | ICD-10-CM | POA: Diagnosis present

## 2016-07-09 LAB — URINE DRUG SCREEN, QUALITATIVE (ARMC ONLY)
Amphetamines, Ur Screen: NOT DETECTED
Barbiturates, Ur Screen: NOT DETECTED
Benzodiazepine, Ur Scrn: POSITIVE — AB
Cannabinoid 50 Ng, Ur ~~LOC~~: NOT DETECTED
Cocaine Metabolite,Ur ~~LOC~~: NOT DETECTED
MDMA (Ecstasy)Ur Screen: NOT DETECTED
Methadone Scn, Ur: NOT DETECTED
Opiate, Ur Screen: NOT DETECTED
Phencyclidine (PCP) Ur S: NOT DETECTED
Tricyclic, Ur Screen: NOT DETECTED

## 2016-07-09 LAB — CBC WITH DIFFERENTIAL/PLATELET
Basophils Absolute: 0.1 10*3/uL (ref 0–0.1)
Basophils Relative: 1 %
Eosinophils Absolute: 0.2 10*3/uL (ref 0–0.7)
Eosinophils Relative: 2 %
HCT: 40.3 % (ref 35.0–47.0)
Hemoglobin: 13.7 g/dL (ref 12.0–16.0)
Lymphocytes Relative: 24 %
Lymphs Abs: 2.2 10*3/uL (ref 1.0–3.6)
MCH: 27.2 pg (ref 26.0–34.0)
MCHC: 33.9 g/dL (ref 32.0–36.0)
MCV: 80.4 fL (ref 80.0–100.0)
Monocytes Absolute: 0.5 10*3/uL (ref 0.2–0.9)
Monocytes Relative: 6 %
Neutro Abs: 6.4 10*3/uL (ref 1.4–6.5)
Neutrophils Relative %: 67 %
Platelets: 277 10*3/uL (ref 150–440)
RBC: 5.01 MIL/uL (ref 3.80–5.20)
RDW: 14.6 % — ABNORMAL HIGH (ref 11.5–14.5)
WBC: 9.4 10*3/uL (ref 3.6–11.0)

## 2016-07-09 LAB — COMPREHENSIVE METABOLIC PANEL
ALT: 16 U/L (ref 14–54)
AST: 19 U/L (ref 15–41)
Albumin: 3.8 g/dL (ref 3.5–5.0)
Alkaline Phosphatase: 74 U/L (ref 38–126)
Anion gap: 6 (ref 5–15)
BUN: <5 mg/dL — ABNORMAL LOW (ref 6–20)
CO2: 28 mmol/L (ref 22–32)
Calcium: 8.7 mg/dL — ABNORMAL LOW (ref 8.9–10.3)
Chloride: 104 mmol/L (ref 101–111)
Creatinine, Ser: 0.72 mg/dL (ref 0.44–1.00)
GFR calc Af Amer: >60 mL/min (ref >60)
GFR calc non Af Amer: >60 mL/min (ref >60)
Glucose, Bld: 144 mg/dL — ABNORMAL HIGH (ref 65–99)
Potassium: 3.2 mmol/L — ABNORMAL LOW (ref 3.5–5.1)
Sodium: 138 mmol/L (ref 135–145)
Total Bilirubin: 0.6 mg/dL (ref 0.3–1.2)
Total Protein: 6.8 g/dL (ref 6.5–8.1)

## 2016-07-09 LAB — URINALYSIS COMPLETE WITH MICROSCOPIC (ARMC ONLY)
Bacteria, UA: NONE SEEN
Bilirubin Urine: NEGATIVE
Glucose, UA: 50 mg/dL — AB
Ketones, ur: NEGATIVE mg/dL
Leukocytes, UA: NEGATIVE
Nitrite: NEGATIVE
Protein, ur: NEGATIVE mg/dL
Specific Gravity, Urine: 1.009 (ref 1.005–1.030)
pH: 6 (ref 5.0–8.0)

## 2016-07-09 LAB — MRSA PCR SCREENING: MRSA by PCR: NEGATIVE

## 2016-07-09 LAB — BLOOD GAS, VENOUS
Acid-Base Excess: 0.5 mmol/L (ref 0.0–2.0)
Bicarbonate: 25.4 mmol/L (ref 20.0–28.0)
O2 Saturation: 81.7 %
Patient temperature: 37
pCO2, Ven: 41 mmHg — ABNORMAL LOW (ref 44.0–60.0)
pH, Ven: 7.4 (ref 7.250–7.430)
pO2, Ven: 46 mmHg — ABNORMAL HIGH (ref 32.0–45.0)

## 2016-07-09 LAB — ACETAMINOPHEN LEVEL: Acetaminophen (Tylenol), Serum: <10 ug/mL — ABNORMAL LOW (ref 10–30)

## 2016-07-09 LAB — TSH: TSH: 1.746 u[IU]/mL (ref 0.350–4.500)

## 2016-07-09 LAB — PREGNANCY, URINE: Preg Test, Ur: NEGATIVE

## 2016-07-09 LAB — GLUCOSE, CAPILLARY
Glucose-Capillary: 106 mg/dL — ABNORMAL HIGH (ref 65–99)
Glucose-Capillary: 163 mg/dL — ABNORMAL HIGH (ref 65–99)

## 2016-07-09 LAB — SALICYLATE LEVEL: Salicylate Lvl: <4 mg/dL (ref 2.8–30.0)

## 2016-07-09 LAB — MAGNESIUM: Magnesium: 1.8 mg/dL (ref 1.7–2.4)

## 2016-07-09 MED ORDER — SODIUM CHLORIDE 0.9% FLUSH
3.0000 mL | Freq: Two times a day (BID) | INTRAVENOUS | Status: DC
  Administered 2016-07-11: 3 mL via INTRAVENOUS

## 2016-07-09 MED ORDER — POTASSIUM CHLORIDE IN NACL 20-0.9 MEQ/L-% IV SOLN
INTRAVENOUS | Status: DC
  Administered 2016-07-10 – 2016-07-11 (×4): via INTRAVENOUS
  Filled 2016-07-09 (×7): qty 1000

## 2016-07-09 MED ORDER — ENOXAPARIN SODIUM 40 MG/0.4ML ~~LOC~~ SOLN
40.0000 mg | SUBCUTANEOUS | Status: DC
  Administered 2016-07-10: 40 mg via SUBCUTANEOUS
  Filled 2016-07-09: qty 0.4

## 2016-07-09 MED ORDER — ATENOLOL 50 MG PO TABS
50.0000 mg | ORAL_TABLET | Freq: Every day | ORAL | Status: DC
  Filled 2016-07-09: qty 1

## 2016-07-09 MED ORDER — ARMODAFINIL 250 MG PO TABS: 250.0000 mg | ORAL_TABLET | ORAL | Status: DC

## 2016-07-09 MED ORDER — PANTOPRAZOLE SODIUM 40 MG PO TBEC: 40.0000 mg | DELAYED_RELEASE_TABLET | Freq: Two times a day (BID) | ORAL | Status: DC

## 2016-07-09 MED ORDER — INSULIN ASPART 100 UNIT/ML ~~LOC~~ SOLN: 4.0000 [IU] | Freq: Three times a day (TID) | SUBCUTANEOUS | Status: DC

## 2016-07-09 MED ORDER — ACETAMINOPHEN 325 MG PO TABS
650.0000 mg | ORAL_TABLET | Freq: Four times a day (QID) | ORAL | Status: DC | PRN
  Administered 2016-07-10: 650 mg via ORAL

## 2016-07-09 MED ORDER — GLYCOPYRROLATE 1 MG PO TABS
2.0000 mg | ORAL_TABLET | Freq: Two times a day (BID) | ORAL | Status: DC
  Administered 2016-07-10 – 2016-07-11 (×3): 2 mg via ORAL
  Filled 2016-07-09 (×4): qty 2

## 2016-07-09 MED ORDER — PANTOPRAZOLE SODIUM 40 MG PO TBEC
40.0000 mg | DELAYED_RELEASE_TABLET | Freq: Every day | ORAL | Status: DC
  Administered 2016-07-10 – 2016-07-11 (×2): 40 mg via ORAL
  Filled 2016-07-09 (×2): qty 1

## 2016-07-09 MED ORDER — ARIPIPRAZOLE 5 MG PO TABS
5.0000 mg | ORAL_TABLET | Freq: Every day | ORAL | Status: DC
  Administered 2016-07-10 – 2016-07-11 (×2): 5 mg via ORAL
  Filled 2016-07-09: qty 3
  Filled 2016-07-09: qty 1

## 2016-07-09 MED ORDER — RISAQUAD PO CAPS
1.0000 | ORAL_CAPSULE | Freq: Every day | ORAL | Status: DC
  Administered 2016-07-10 – 2016-07-11 (×2): 1 via ORAL
  Filled 2016-07-09 (×2): qty 1

## 2016-07-09 MED ORDER — INSULIN ASPART 100 UNIT/ML ~~LOC~~ SOLN
0.0000 [IU] | Freq: Three times a day (TID) | SUBCUTANEOUS | Status: DC
Start: 2016-07-10 — End: 2016-07-11
  Administered 2016-07-11: 3 [IU] via SUBCUTANEOUS
  Administered 2016-07-11: 1 [IU] via SUBCUTANEOUS
  Filled 2016-07-09 (×2): qty 1

## 2016-07-09 MED ORDER — VENLAFAXINE HCL ER 75 MG PO CP24
300.0000 mg | ORAL_CAPSULE | Freq: Every day | ORAL | Status: DC
  Administered 2016-07-10 – 2016-07-11 (×2): 300 mg via ORAL
  Filled 2016-07-09 (×2): qty 4

## 2016-07-09 MED ORDER — MODAFINIL 200 MG PO TABS: 200.0000 mg | ORAL_TABLET | Freq: Every morning | ORAL | Status: DC

## 2016-07-09 MED ORDER — SODIUM CHLORIDE 0.9 % IV BOLUS (SEPSIS)
1000.0000 mL | Freq: Once | INTRAVENOUS | Status: AC
  Administered 2016-07-09: 1000 mL via INTRAVENOUS

## 2016-07-09 MED ORDER — AMPHETAMINE-DEXTROAMPHETAMINE 5 MG PO TABS
10.0000 mg | ORAL_TABLET | Freq: Every morning | ORAL | Status: DC
Start: 2016-07-10 — End: 2016-07-11
  Administered 2016-07-10 – 2016-07-11 (×2): 10 mg via ORAL
  Filled 2016-07-09 (×2): qty 2

## 2016-07-09 MED ORDER — ONDANSETRON HCL 4 MG/2ML IJ SOLN: 4.0000 mg | Freq: Four times a day (QID) | INTRAMUSCULAR | Status: DC | PRN

## 2016-07-09 MED ORDER — MODAFINIL 100 MG PO TABS
200.0000 mg | ORAL_TABLET | Freq: Every morning | ORAL | Status: DC
  Administered 2016-07-10 – 2016-07-11 (×2): 200 mg via ORAL
  Filled 2016-07-09 (×2): qty 2

## 2016-07-09 MED ORDER — ONDANSETRON HCL 4 MG PO TABS: 4.0000 mg | ORAL_TABLET | Freq: Four times a day (QID) | ORAL | Status: DC | PRN

## 2016-07-09 MED ORDER — ACETAMINOPHEN 650 MG RE SUPP: 650.0000 mg | Freq: Four times a day (QID) | RECTAL | Status: DC | PRN

## 2016-07-09 NOTE — ED Triage Notes (Signed)
Pt ems from home for overdose, possibly Xanax. Per ems pt took approx. 50 .5 mg xanax. Pt very sleepy, able to wake up with mid stimulation.

## 2016-07-09 NOTE — ED Provider Notes (Signed)
[facility] Emergency Department Provider Note    First MD Initiated Contact with Patient 07/09/16 1031     (approximate)  I have reviewed the triage vital signs and the nursing notes.   HISTORY  Chief Complaint Drug Overdose  Level V caveat:  Overdose with AMS HPI Christine Cook is a 40 y.o. female  with a history of anxiety and depression presents after intentional overdose of her Xanax by mouth's. Patient arrives to the ER somnolent and encephalopathic. Moving all extremities without facial droop. Unable to provide reliable history due to acute encephalopathy therefore history obtained from nursing and EMS reports. There is no evidence of trauma. Patient had access to up to 50 pills but this was prescribed in August.     Past Medical History:  Diagnosis Date  . Anxiety   . Depression   . Diabetes ([place])   . Fatty liver disease, nonalcoholic   . Heart murmur   . Heart palpitations   . High cholesterol   . 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 Hypertension   . Irritable bowel syndrome with diarrhea   . 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 Migraine   . 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 Sleep apnea with hypersomnolence    CPAP study 11-28-09,  10-02-2009 AHI was 16.5, pressure to   . Tachycardia     Patient Active Problem List   Diagnosis Date Noted  . 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 OSA on CPAP 02/20/2014  . 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 Sleep apnea with hypersomnolence   . HEMORRHOIDS-INTERNAL 11/17/2010  . HEMORRHOIDS-EXTERNAL 11/17/2010  . RECTAL BLEEDING 11/17/2010  . LIVER MASS 02/24/2009  . DM 01/21/2009  . HYPERCHOLESTEROLEMIA 01/21/2009  . OBESITY 01/21/2009  . ANXIETY 01/21/2009  . DEPRESSION 01/21/2009  . GERD 01/21/2009  . IBS 01/21/2009  . 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 SLEEP APNEA 01/21/2009  . HEADACHE, CHRONIC 01/21/2009  . TRANSAMINASES, SERUM, ELEVATED  01/21/2009    Past Surgical History:  Procedure Laterality Date  . ADENOIDECTOMY    . BLADDER SURGERY    . CHOLECYSTECTOMY    . KNEE SURGERY     Left knee x 2   . TONSILLECTOMY      Prior to Admission medications   Medication Sig Start Date End Date Taking? Authorizing Provider  amphetamine-dextroamphetamine (ADDERALL) 10 MG tablet Take 1 tablet (10 mg total) by mouth every morning. 08/05/14   Asencion Partridge Dohmeier, MD  ARIPiprazole (ABILIFY) 5 MG tablet Take 5 mg by mouth daily.    Historical Provider, MD  Armodafinil 250 MG tablet Take 1 tablet (250 mg total) by mouth every morning. 09/02/14   Larey Seat, MD  atenolol (TENORMIN) 50 MG tablet Take 50 mg by mouth at bedtime.    Historical Provider, MD  diphenoxylate-atropine (LOMOTIL) 2.5-0.025 MG per tablet TAKE ONE TABLET BY MOUTH TWICE DAILY 12/09/14   Irene Shipper, MD  glycopyrrolate (ROBINUL) 2 MG tablet Take 1 tablet (2 mg total) by mouth 2 (two) times daily. 12/11/13   Amy S Esterwood, PA-C  lisinopril (PRINIVIL,ZESTRIL) 20 MG tablet Take 1 tablet (20 mg total) by mouth daily. 01/16/16   Norval Gable, MD  ondansetron (ZOFRAN) 4 MG tablet Take 1 tab every 6 hours as needed for cramping, spasms diarrhea. 12/11/13   Amy S Esterwood, PA-C  pantoprazole (PROTONIX) 40 MG tablet Take 40 mg by mouth 2 (two) times daily.    Historical Provider, MD  Probiotic Product (ALIGN) 4 MG CAPS Take 1 capsule by mouth daily.    Historical Provider, MD  Sodium Oxybate 500 MG/ML SOLN Starter dose of XYREM, 2.25 gram twice at night for 7 days, than 3 gram 7 days , two doses at night. Than 4.5 gram final dose , twice nightly. 05/22/14   Asencion Partridge Dohmeier, MD  venlafaxine XR (EFFEXOR-XR) 150 MG 24 hr capsule Take 300 mg by mouth daily with breakfast.    Historical Provider, MD    Allergies Codeine; Morphine; and Neurontin [gabapentin]  Family History  Problem Relation Age of Onset  . Hypertension Father   . Coronary artery disease Father   . Diabetes Father    . Asthma Mother   . Asthma Daughter   . Colon cancer Neg Hx     Social History Social History  Substance Use Topics  . Smoking status: Former Smoker    Packs/day: 0.50    Years: 2.00    Types: Cigarettes    Quit date: 02/14/1995  . Smokeless tobacco: Never Used  . Alcohol use No    Review of Systems Patient denies headaches, rhinorrhea, blurry vision, numbness, shortness of breath, chest pain, edema, cough, abdominal pain, nausea, vomiting, diarrhea, dysuria, fevers, rashes or hallucinations unless otherwise stated above in HPI. ____________________________________________   PHYSICAL EXAM:  VITAL SIGNS: Vitals:   07/09/16 1130 07/09/16 1145  BP: 109/73 106/74  Pulse: (!) 108 (!) 117  Resp: (!) 25 (!) 22  Temp:      Constitutional: Acutely ill-appearing and encephalopathic. No respiratory distress. Eyes: Conjunctivae are normal. PERRL. EOMI. Head: Atraumatic. Nose: No congestion/rhinnorhea. Mouth/Throat: Mucous membranes are moist.  Oropharynx non-erythematous. Neck: No stridor. Painless ROM. No cervical spine tenderness to palpation Hematological/Lymphatic/Immunilogical: No cervical lymphadenopathy. Cardiovascular: Normal rate, regular rhythm. Grossly normal heart sounds.  Good peripheral circulation. Respiratory: Normal respiratory effort.  No retractions. Lungs CTAB. Gastrointestinal: Soft and nontender. No distention. No abdominal bruits. No CVA tenderness. Genitourinary:  Musculoskeletal: No lower extremity tenderness nor edema.  No joint effusions. Neurologic:  Slurred speech, encephalopathic, mae, no facial droop Skin:  Skin is warm, dry and intact. No rash noted. Psychiatric: Drowsy.  ____________________________________________   LABS (all labs ordered are listed, but only abnormal results are displayed)  Results for orders placed or performed during the hospital encounter of 07/09/16 (from the past 24 hour(s))  Urine Drug Screen, Qualitative ([facility]  only)     Status: Abnormal   Collection Time: 07/09/16 11:17 AM  Result Value Ref Range   Tricyclic, Ur Screen NONE DETECTED NONE DETECTED   Amphetamines, Ur Screen NONE DETECTED NONE DETECTED   MDMA (Ecstasy)Ur Screen NONE DETECTED NONE DETECTED   Cocaine Metabolite,Ur [place] NONE DETECTED NONE DETECTED   Opiate, Ur Screen NONE DETECTED NONE DETECTED   Phencyclidine (PCP) Ur S NONE DETECTED NONE DETECTED   Cannabinoid 50 Ng, Ur [place] NONE DETECTED NONE DETECTED   Barbiturates, Ur Screen NONE DETECTED NONE DETECTED   Benzodiazepine, Ur Scrn POSITIVE (A) NONE DETECTED   Methadone Scn, Ur NONE DETECTED NONE DETECTED  Urinalysis complete, with microscopic ([facility] only)     Status: Abnormal   Collection Time: 07/09/16 11:18 AM  Result Value Ref Range   Color, Urine YELLOW (A) YELLOW   APPearance CLEAR (A) CLEAR   Glucose, UA 50 (A) NEGATIVE mg/dL   Bilirubin Urine NEGATIVE NEGATIVE   Ketones, ur NEGATIVE NEGATIVE mg/dL   Specific Gravity, Urine 1.009 1.005 - 1.030   Hgb urine dipstick 1+ (A) NEGATIVE  pH 6.0 5.0 - 8.0   Protein, ur NEGATIVE NEGATIVE mg/dL   Nitrite NEGATIVE NEGATIVE   Leukocytes, UA NEGATIVE NEGATIVE   RBC / HPF 0-5 0 - 5 RBC/hpf   WBC, UA 0-5 0 - 5 WBC/hpf   Bacteria, UA NONE SEEN NONE SEEN   Squamous Epithelial / LPF 0-5 (A) NONE SEEN   Mucous PRESENT   Acetaminophen level     Status: Abnormal   Collection Time: 07/09/16 11:18 AM  Result Value Ref Range   Acetaminophen (Tylenol), Serum <10 (L) 10 - 30 ug/mL  Salicylate level     Status: None   Collection Time: 07/09/16 11:18 AM  Result Value Ref Range   Salicylate Lvl 123456 2.8 - 30.0 mg/dL  CBC with Differential/Platelet     Status: Abnormal   Collection Time: 07/09/16 11:18 AM  Result Value Ref Range   WBC 9.4 3.6 - 11.0 K/uL   RBC 5.01 3.80 - 5.20 MIL/uL   Hemoglobin 13.7 12.0 - 16.0 g/dL   HCT 40.3 35.0 - 47.0 %   MCV 80.4 80.0 - 100.0 fL   MCH 27.2 26.0 - 34.0 pg   MCHC 33.9 32.0 - 36.0 g/dL   RDW 14.6 (H)  11.5 - 14.5 %   Platelets 277 150 - 440 K/uL   Neutrophils Relative % 67 %   Neutro Abs 6.4 1.4 - 6.5 K/uL   Lymphocytes Relative 24 %   Lymphs Abs 2.2 1.0 - 3.6 K/uL   Monocytes Relative 6 %   Monocytes Absolute 0.5 0.2 - 0.9 K/uL   Eosinophils Relative 2 %   Eosinophils Absolute 0.2 0 - 0.7 K/uL   Basophils Relative 1 %   Basophils Absolute 0.1 0 - 0.1 K/uL  Comprehensive metabolic panel     Status: Abnormal   Collection Time: 07/09/16 11:18 AM  Result Value Ref Range   Sodium 138 135 - 145 mmol/L   Potassium 3.2 (L) 3.5 - 5.1 mmol/L   Chloride 104 101 - 111 mmol/L   CO2 28 22 - 32 mmol/L   Glucose, Bld 144 (H) 65 - 99 mg/dL   BUN <5 (L) 6 - 20 mg/dL   Creatinine, Ser 0.72 0.44 - 1.00 mg/dL   Calcium 8.7 (L) 8.9 - 10.3 mg/dL   Total Protein 6.8 6.5 - 8.1 g/dL   Albumin 3.8 3.5 - 5.0 g/dL   AST 19 15 - 41 U/L   ALT 16 14 - 54 U/L   Alkaline Phosphatase 74 38 - 126 U/L   Total Bilirubin 0.6 0.3 - 1.2 mg/dL   GFR calc non Af Amer >60 >60 mL/min   GFR calc Af Amer >60 >60 mL/min   Anion gap 6 5 - 15  Pregnancy, urine     Status: None   Collection Time: 07/09/16 11:18 AM  Result Value Ref Range   Preg Test, Ur NEGATIVE NEGATIVE  Blood gas, venous     Status: Abnormal   Collection Time: 07/09/16 12:11 PM  Result Value Ref Range   pH, Ven 7.40 7.250 - 7.430   pCO2, Ven 41 (L) 44.0 - 60.0 mmHg   pO2, Ven 46.0 (H) 32.0 - 45.0 mmHg   Bicarbonate 25.4 20.0 - 28.0 mmol/L   Acid-Base Excess 0.5 0.0 - 2.0 mmol/L   O2 Saturation 81.7 %   Patient temperature 37.0    Collection site VEIN    Sample type VENOUS    ____________________________________________  EKG My review and personal interpretation at Time:  11:09   Indication: od  Rate: 115  Rhythm: sinus Axis: normal Other: normal intervals, no acute ischemia ____________________________________________  RADIOLOGY  I personally reviewed all radiographic images ordered to evaluate for the above acute complaints and  reviewed radiology reports and findings.  These findings were personally discussed with the patient.  Please see medical record for radiology report.  ____________________________________________   PROCEDURES  Procedure(s) performed: none    Critical Care performed: no ____________________________________________   INITIAL IMPRESSION / ASSESSMENT AND PLAN / ED COURSE  Pertinent labs & imaging results that were available during my care of the patient were reviewed by me and considered in my medical decision making (see chart for details).  DDX: overdose, sepsis, dehydation, suicide  Christine Cook is a 40 y.o. who presents to the ED with intentional overdose arrives in moderate distress given acute encephalopathy. Patient protecting her airway without any hypoxia. Patient placed immediately on a monitor showing tachycardia but normotensive. Afebrile. History and exam is more consistent with intentional overdose on benzodiazepine. As she is chronically on this medication will not reverse. No evidence of head trauma. No report of coingestions.The patient will be placed on continuous pulse oximetry and telemetry for monitoring.  Laboratory evaluation will be sent to evaluate for the above complaints.     Clinical Course  Comment By Time  Patient becoming more drowsy requiring frequent checks but will open eyes to sternal rub. Merlyn Lot, MD 10/06 1201  Patient reassessed without any worsening of her encephalopathy. Merlyn Lot, MD 10/06 1225  Patient still encephalopathic. listening in the conversations. Still requiring frequent stimulation to prevent excessive somnolence. No respiratory distress. Daughter brought cell phone which shows multiple texts to family and friends stating that she was going to commit suicide as well as recent surgeries looking up necessary doses for a successful overdose. Merlyn Lot, MD 10/06 [phone]  Patient again reassessed and persistently drowsy.  Based on her persistent encephalopathy tachycardia and drowsiness to fill patient will require admission for a monitor bed while she metabolizes.  Have discussed with the patient and available family all diagnostics and treatments performed thus far and all questions were answered to the best of my ability. The patient demonstrates understanding and agreement with plan.  Merlyn Lot, MD 10/06 1458     ____________________________________________   FINAL CLINICAL IMPRESSION(S) / ED DIAGNOSES  Final diagnoses:  Intentional drug overdose, initial encounter ([place])  Encephalopathy acute      NEW MEDICATIONS STARTED DURING THIS VISIT:  New Prescriptions   No medications on file     Note:  This document was prepared using Dragon voice recognition software and may include unintentional dictation errors.    Merlyn Lot, MD 07/09/16 1540

## 2016-07-09 NOTE — H&P (Signed)
[place] [facility] [place] NAME: Christine Cook    MR#:  [id number]  DATE OF BIRTH:  10-08-1975  DATE OF ADMISSION:  07/09/2016  PRIMARY CARE PHYSICIAN: Marton Redwood, MD   REQUESTING/REFERRING PHYSICIAN:   CHIEF COMPLAINT:   Chief Complaint  Patient presents with  . Drug Overdose    HISTORY OF PRESENT ILLNESS: Christine Cook  is a 40 y.o. female with a known history of Anxiety, depression, diabetes mellitus, fatty liver disease, obesity, hyperlipidemia, hypersomnia, hypertension, migraine headaches, obstructive sleep apnea, who presents to the hospital with complaints of wanting to end her life. She took some Effexor yesterday, however, took 50 pills of Xanax 0.5 mg, she was supposed to take this medication twice a day. She takes that her friend, who contacted patient's mother, who brought her to the hospital for further evaluation and treatment. On arrival to the hospital on a patient was somnolent, had ABGs done, which revealed normal pH, but mildly elevated PCO2, labs revealed hypokalemia, hyperglycemia, urine drug screen was positive for benzodiazepines. Chest x-ray revealed low lung volumes, mild basilar atelectasis. Patient is not able to provide review of systems, however, if she is awakened briefly, able to answer some questions and injured his back to sleep Patient admitted of diarrhea over the past few weeks and some abdominal pain with diarrhea     PAST MEDICAL HISTORY:   Past Medical History:  Diagnosis Date  . Anxiety   . Depression   . Diabetes ([place])   . Fatty liver disease, nonalcoholic   . Heart murmur   . Heart palpitations   . High cholesterol   . 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 Hypertension   . Irritable bowel syndrome with diarrhea   . 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 Migraine   . 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 Sleep apnea with hypersomnolence    CPAP study 11-28-09,   10-02-2009 AHI was 16.5, pressure to   . Tachycardia     PAST SURGICAL HISTORY: Past Surgical History:  Procedure Laterality Date  . ADENOIDECTOMY    . BLADDER SURGERY    . CHOLECYSTECTOMY    . KNEE SURGERY     Left knee x 2   . TONSILLECTOMY      SOCIAL HISTORY:  Social History  Substance Use Topics  . Smoking status: Former Smoker    Packs/day: 0.50    Years: 2.00    Types: Cigarettes    Quit date: 02/14/1995  . Smokeless tobacco: Never Used  . Alcohol use No    FAMILY HISTORY:  Family History  Problem Relation Age of Onset  . Hypertension Father   . Coronary artery disease Father   . Diabetes Father   . Asthma Mother   . Asthma Daughter   . Colon cancer Neg Hx     DRUG ALLERGIES:  Allergies  Allergen Reactions  . Codeine   . Morphine   . Neurontin [Gabapentin] Other (See Comments)    syncope    Review of Systems  Unable to perform ROS: Mental acuity    MEDICATIONS AT HOME:  Prior to Admission medications   Medication Sig Start Date End Date Taking? Authorizing Provider  amphetamine-dextroamphetamine (ADDERALL) 10 MG tablet Take 1 tablet (10 mg total) by mouth every morning. 08/05/14   Asencion Partridge Dohmeier, MD  ARIPiprazole (ABILIFY) 5 MG tablet Take 5 mg by mouth daily.    Historical Provider, MD  Armodafinil 250 MG tablet Take 1 tablet (250 mg total) by mouth every morning. 09/02/14   Larey Seat, MD  atenolol (TENORMIN) 50 MG tablet Take 50 mg by mouth at bedtime.    Historical Provider, MD  diphenoxylate-atropine (LOMOTIL) 2.5-0.025 MG per tablet TAKE ONE TABLET BY MOUTH TWICE DAILY 12/09/14   Irene Shipper, MD  glycopyrrolate (ROBINUL) 2 MG tablet Take 1 tablet (2 mg total) by mouth 2 (two) times daily. 12/11/13   Amy S Esterwood, PA-C  lisinopril (PRINIVIL,ZESTRIL) 20 MG tablet Take 1 tablet (20 mg total) by mouth daily. 01/16/16   Norval Gable, MD  ondansetron (ZOFRAN) 4 MG tablet Take 1 tab every 6 hours as needed for cramping, spasms diarrhea. 12/11/13    Amy S Esterwood, PA-C  pantoprazole (PROTONIX) 40 MG tablet Take 40 mg by mouth 2 (two) times daily.    Historical Provider, MD  Probiotic Product (ALIGN) 4 MG CAPS Take 1 capsule by mouth daily.    Historical Provider, MD  Sodium Oxybate 500 MG/ML SOLN Starter dose of XYREM, 2.25 gram twice at night for 7 days, than 3 gram 7 days , two doses at night. Than 4.5 gram final dose , twice nightly. 05/22/14   Asencion Partridge Dohmeier, MD  venlafaxine XR (EFFEXOR-XR) 150 MG 24 hr capsule Take 300 mg by mouth daily with breakfast.    Historical Provider, MD      PHYSICAL EXAMINATION:   VITAL SIGNS: Blood pressure 111/90, pulse 93, temperature 98.1 F (36.7 C), temperature source Oral, resp. rate (!) 21, weight 74.8 kg (165 lb), SpO2 98 %.  GENERAL:  40 y.o.-year-old patient lying in the bed with no acute distress. Somnolent, able to open her eyes, briefly answer a few questions, however, just back to sleep immediately after stimulation ceases  EYES: Equal, round, reactive to light and accommodation. No scleral icterus. Extraocular muscles intact.  HEENT: Head atraumatic, normocephalic. Oropharynx and nasopharynx clear.  NECK:  Supple, no jugular venous distention. No thyroid enlargement, no tenderness.  L, diminished breath sounds bilaterally at bases , no wheezing, rales,rhonchi or crepitation. No use of accessory muscles of respiration.  CARDIOVASCULAR: S1, S2 normal. No murmurs, rubs, or gallops.  ABDOMEN: Soft, nontender, nondistended. Bowel sounds present. No organomegaly or mass.  EXTREMITIES: No pedal edema, cyanosis, or clubbing.  NEUROLOGIC: Cranial nerves II through XII are intact. Muscle strength 5/5 in all extremities. Sensation intact. Gait not checked.  PSYCHIATRIC: The patient is very somnolent, able to briefly open her eyes, does not follow commands, not able to assess orientation  SKIN: No obvious rash, lesion, or ulcer.   LABORATORY PANEL:   CBC  Recent Labs Lab 07/09/16 1118  WBC  9.4  HGB 13.7  HCT 40.3  PLT 277  MCV 80.4  MCH 27.2  MCHC 33.9  RDW 14.6*  LYMPHSABS 2.2  MONOABS 0.5  EOSABS 0.2  BASOSABS 0.1   ------------------------------------------------------------------------------------------------------------------  Chemistries   Recent Labs Lab 07/09/16 1118  NA 138  K 3.2*  CL 104  CO2 28  GLUCOSE 144*  BUN <5*  CREATININE 0.72  CALCIUM 8.7*  AST 19  ALT 16  ALKPHOS 74  BILITOT 0.6   ------------------------------------------------------------------------------------------------------------------  Cardiac Enzymes No results for input(s): TROPONINI in the last 168 hours. ------------------------------------------------------------------------------------------------------------------  RADIOLOGY: Dg Chest 1 View  Result Date: 07/09/2016 CLINICAL DATA:  Altered mental status. EXAM: CHEST 1 VIEW COMPARISON:  No recent prior . FINDINGS: Mediastinum and hilar structures normal. Low lung volumes with mild bibasilar atelectasis. Mild  cardiomegaly with normal pulmonary vascularity. No pleural effusion or pneumothorax . IMPRESSION: Low lung volumes with mild bibasilar atelectasis. Electronically Signed   By: Marcello Moores  Register   On: 07/09/2016 11:13    EKG: Orders placed or performed during the hospital encounter of 07/09/16  . ED EKG  . ED EKG  . EKG 12-Lead  . EKG 12-Lead   EKG in the emergency room revealed sinus tachycardia at 115 bpm, borderline T-wave abnormalities in inferior leads, no acute ST-T changes    IMPRESSION AND PLAN:  Principal Problem:   Overdose of benzodiazepine Active Problems:   GERD   Major depression   Suicidal ideation   Involuntary commitment   Hypertension   Toxic metabolic encephalopathy   Benzodiazepine overdose   Suicidal intent   Depression   Diarrhea   Hypokalemia   Hyperglycemia  #1. Toxic metabolic encephalopathy due to hypercapnia, benzodiazepines, admit patient to intensive care unit,  BiPAP as needed, supportive therapy   #2 suicidal intent, IVC, psychiatric consultation, may benefit from psychiatric admission   #3. Diarrhea per report, get stool cultures, including C. Difficile #4. Hypokalemia, supplement intravenously, get magnesium level, supplement as needed #5. Hyperglycemia, get hemoglobin A1c to rule out diabetes #6. History of depression, psychiatry consultation is requested   All the records are reviewed and case discussed with ED provider. Management plans discussed with the patient, family and they are in agreement.  CODE STATUS: Code Status History    This patient does not have a recorded code status. Please follow your organizational policy for patients in this situation.       TOTALCritical care  TIME TAKING CARE OF THIS PATIENT: 60 minutes.    Theodoro Grist M.D on 07/09/2016 at 4:39 PM  Between 7am to 6pm - Pager - [phone] After 6pm go to [email or web address] - password [email or web address] [facility]  [facility]  Office  [phone]  CC: Primary care physician; Marton Redwood, MD

## 2016-07-09 NOTE — Consult Note (Signed)
[facility] Consult   Reason for Consult:  Consult for 40 year old woman who is in the hospital after taking an overdose of prescription medicine Referring Physician:  Quentin Cornwall Patient Identification: Christine Cook MRN:  [id number] Principal Diagnosis: Overdose of benzodiazepine Diagnosis:   Patient Active Problem List   Diagnosis Date Noted  . Overdose of benzodiazepine [T42.4X1A] 07/09/2016  . Major depression [F32.9] 07/09/2016  . Suicidal ideation [R45.851] 07/09/2016  . Involuntary commitment [Z04.6] 07/09/2016  . Hypertension [I10] 07/09/2016  . 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 OSA on CPAP [G47.33, Z99.89] 02/20/2014  . Hypersomnia, persistent [G47.10] 02/20/2014  . Sleep apnea with hypersomnolence [G47.10, G47.30]   . HEMORRHOIDS-INTERNAL [K64.8] 11/17/2010  . HEMORRHOIDS-EXTERNAL [K64.4] 11/17/2010  . RECTAL BLEEDING [K62.5] 11/17/2010  . LIVER MASS [D37.6] 02/24/2009  . DM [E11.9] 01/21/2009  . HYPERCHOLESTEROLEMIA [E78.00] 01/21/2009  . OBESITY [E66.9] 01/21/2009  . ANXIETY [F41.1] 01/21/2009  . DEPRESSION [F32.9] 01/21/2009  . GERD [K21.9] 01/21/2009  . IBS [K58.9] 01/21/2009  . 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 SLEEP APNEA [G47.30] 01/21/2009  . HEADACHE, CHRONIC [R51] 01/21/2009  . TRANSAMINASES, SERUM, ELEVATED [R74.0] 01/21/2009    Total Time spent with patient: 1 hour  Subjective:   Christine Cook is a 40 y.o. female patient admitted with "I took some pills".  HPI:  Attempted to interview patient but she remains very sedated and was minimally responsive. Spoke with her daughter and her mother both of whom are at bedside. Patient was brought to the hospital after taking an overdose of Xanax. Patient is able to tell me that she took approximately 50 Xanax tablets. These would be presumably 0.5 mg strength since that is the dose she was prescribed. She did that some time this morning probably around age or 9:00.  Patient is not answering any other questions right now. Her daughter is able to show me evidence of text messages where the patient had been talking about wanting to overdose and harm herself. Both daughter and mother reports that the patient has appeared to be very overwhelmed recently. Lost a job yesterday. She does go to outpatient psychiatric treatment and sees a therapist. Does not apparently routinely abuse substances.  Social history: Patient lives with her mother. I'm told that the patient just lost a job yesterday. Don't know much else in the way of details.  Medical history: Overweight. History of high blood pressure asked reflux  Substance abuse: No report in the chart of past substance abuse family is not reporting an active substance abuse history. Doesn't appear to be on any other drugs besides the prescription Xanax.  Past Psychiatric History: Patient has never been in a psychiatric hospital before. No known suicide attempts in the past. Is currently taking an antidepressant medicine although the family does not know what it is. She sees a therapist and psychiatrist. Has been treated for anxiety and depression. No previous inpatient hospitalization.  Risk to Self: Is patient at risk for suicide?: Yes Risk to Others:   Prior Inpatient Therapy:   Prior Outpatient Therapy:    Past Medical History:  Past Medical History:  Diagnosis Date  . Anxiety   . Depression   . Diabetes ([place])   . Fatty liver disease, nonalcoholic   . Heart murmur   . Heart palpitations   . High cholesterol   . 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 Hypertension   . Irritable bowel syndrome with diarrhea   . 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  Migraine   . 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 Sleep apnea with hypersomnolence    CPAP study 11-28-09,  10-02-2009 AHI was 16.5, pressure to   . Tachycardia     Past Surgical History:  Procedure Laterality Date  . ADENOIDECTOMY     . BLADDER SURGERY    . CHOLECYSTECTOMY    . KNEE SURGERY     Left knee x 2   . TONSILLECTOMY     Family History:  Family History  Problem Relation Age of Onset  . Hypertension Father   . Coronary artery disease Father   . Diabetes Father   . Asthma Mother   . Asthma Daughter   . Colon cancer Neg Hx    Family Psychiatric  History: Positive for anxiety Social History:  History  Alcohol Use No     History  Drug Use No    Social History   Social History  . Marital status: Single    Spouse name: N/A  . Number of children: 1  . Years of education: College   Occupational History  . Support specialist   .  [employer]   Social History Main Topics  . Smoking status: Former Smoker    Packs/day: 0.50    Years: 2.00    Types: Cigarettes    Quit date: 02/14/1995  . Smokeless tobacco: Never Used  . Alcohol use No  . Drug use: No  . Sexual activity: Not Asked   Other Topics Concern  . None   Social History Narrative   Patient is single and lives alone.   Patient works at [employer].   Patient has a college education.   Patient has one child.   Patient is right-handed.   Patient drinks two 20 oz of Coke daily.   Additional Social History:    Allergies:   Allergies  Allergen Reactions  . Codeine   . Morphine   . Neurontin [Gabapentin] Other (See Comments)    syncope    Labs:  Results for orders placed or performed during the hospital encounter of 07/09/16 (from the past 48 hour(s))  Urine Drug Screen, Qualitative ([place] only)     Status: Abnormal   Collection Time: 07/09/16 11:17 AM  Result Value Ref Range   Tricyclic, Ur Screen NONE DETECTED NONE DETECTED   Amphetamines, Ur Screen NONE DETECTED NONE DETECTED   MDMA (Ecstasy)Ur Screen NONE DETECTED NONE DETECTED   Cocaine Metabolite,Ur [place] NONE DETECTED NONE DETECTED   Opiate, Ur Screen NONE DETECTED NONE DETECTED   Phencyclidine (PCP) Ur S NONE DETECTED NONE DETECTED   Cannabinoid 50 Ng, Ur [place] NONE  DETECTED NONE DETECTED   Barbiturates, Ur Screen NONE DETECTED NONE DETECTED   Benzodiazepine, Ur Scrn POSITIVE (A) NONE DETECTED   Methadone Scn, Ur NONE DETECTED NONE DETECTED    Comment: (NOTE) 027  Tricyclics, urine               Cutoff 1000 ng/mL 200  Amphetamines, urine             Cutoff 1000 ng/mL 300  MDMA (Ecstasy), urine           Cutoff 500 ng/mL 400  Cocaine Metabolite, urine       Cutoff 300 ng/mL 500  Opiate, urine                   Cutoff 300 ng/mL 600  Phencyclidine (PCP), urine      Cutoff 25  ng/mL 700  Cannabinoid, urine              Cutoff 50 ng/mL 800  Barbiturates, urine             Cutoff 200 ng/mL 900  Benzodiazepine, urine           Cutoff 200 ng/mL 1000 Methadone, urine                Cutoff 300 ng/mL 1100 1200 The urine drug screen provides only a preliminary, unconfirmed 1300 analytical test result and should not be used for non-medical 1400 purposes. Clinical consideration and professional judgment should 1500 be applied to any positive drug screen result due to possible 1600 interfering substances. A more specific alternate chemical method 1700 must be used in order to obtain a confirmed analytical result.  1800 Gas chromato graphy / mass spectrometry (GC/MS) is the preferred 1900 confirmatory method.   Urinalysis complete, with microscopic ([facility] only)     Status: Abnormal   Collection Time: 07/09/16 11:18 AM  Result Value Ref Range   Color, Urine YELLOW (A) YELLOW   APPearance CLEAR (A) CLEAR   Glucose, UA 50 (A) NEGATIVE mg/dL   Bilirubin Urine NEGATIVE NEGATIVE   Ketones, ur NEGATIVE NEGATIVE mg/dL   Specific Gravity, Urine 1.009 1.005 - 1.030   Hgb urine dipstick 1+ (A) NEGATIVE   pH 6.0 5.0 - 8.0   Protein, ur NEGATIVE NEGATIVE mg/dL   Nitrite NEGATIVE NEGATIVE   Leukocytes, UA NEGATIVE NEGATIVE   RBC / HPF 0-5 0 - 5 RBC/hpf   WBC, UA 0-5 0 - 5 WBC/hpf   Bacteria, UA NONE SEEN NONE SEEN   Squamous Epithelial / LPF 0-5 (A) NONE SEEN    Mucous PRESENT   Acetaminophen level     Status: Abnormal   Collection Time: 07/09/16 11:18 AM  Result Value Ref Range   Acetaminophen (Tylenol), Serum <10 (L) 10 - 30 ug/mL    Comment:        THERAPEUTIC CONCENTRATIONS VARY SIGNIFICANTLY. A RANGE OF 10-30 ug/mL MAY BE AN EFFECTIVE CONCENTRATION FOR MANY PATIENTS. HOWEVER, SOME ARE BEST TREATED AT CONCENTRATIONS OUTSIDE THIS RANGE. ACETAMINOPHEN CONCENTRATIONS >150 ug/mL AT 4 HOURS AFTER INGESTION AND >50 ug/mL AT 12 HOURS AFTER INGESTION ARE OFTEN ASSOCIATED WITH TOXIC REACTIONS.   Salicylate level     Status: None   Collection Time: 07/09/16 11:18 AM  Result Value Ref Range   Salicylate Lvl <2.6 2.8 - 30.0 mg/dL  CBC with Differential/Platelet     Status: Abnormal   Collection Time: 07/09/16 11:18 AM  Result Value Ref Range   WBC 9.4 3.6 - 11.0 K/uL   RBC 5.01 3.80 - 5.20 MIL/uL   Hemoglobin 13.7 12.0 - 16.0 g/dL   HCT 40.3 35.0 - 47.0 %   MCV 80.4 80.0 - 100.0 fL   MCH 27.2 26.0 - 34.0 pg   MCHC 33.9 32.0 - 36.0 g/dL   RDW 14.6 (H) 11.5 - 14.5 %   Platelets 277 150 - 440 K/uL   Neutrophils Relative % 67 %   Neutro Abs 6.4 1.4 - 6.5 K/uL   Lymphocytes Relative 24 %   Lymphs Abs 2.2 1.0 - 3.6 K/uL   Monocytes Relative 6 %   Monocytes Absolute 0.5 0.2 - 0.9 K/uL   Eosinophils Relative 2 %   Eosinophils Absolute 0.2 0 - 0.7 K/uL   Basophils Relative 1 %   Basophils Absolute 0.1 0 - 0.1 K/uL  Comprehensive metabolic panel  Status: Abnormal   Collection Time: 07/09/16 11:18 AM  Result Value Ref Range   Sodium 138 135 - 145 mmol/L   Potassium 3.2 (L) 3.5 - 5.1 mmol/L   Chloride 104 101 - 111 mmol/L   CO2 28 22 - 32 mmol/L   Glucose, Bld 144 (H) 65 - 99 mg/dL   BUN <5 (L) 6 - 20 mg/dL   Creatinine, Ser 0.72 0.44 - 1.00 mg/dL   Calcium 8.7 (L) 8.9 - 10.3 mg/dL   Total Protein 6.8 6.5 - 8.1 g/dL   Albumin 3.8 3.5 - 5.0 g/dL   AST 19 15 - 41 U/L   ALT 16 14 - 54 U/L   Alkaline Phosphatase 74 38 - 126 U/L    Total Bilirubin 0.6 0.3 - 1.2 mg/dL   GFR calc non Af Amer >60 >60 mL/min   GFR calc Af Amer >60 >60 mL/min    Comment: (NOTE) The eGFR has been calculated using the CKD EPI equation. This calculation has not been validated in all clinical situations. eGFR's persistently <60 mL/min signify possible Chronic Kidney Disease.    Anion gap 6 5 - 15  Pregnancy, urine     Status: None   Collection Time: 07/09/16 11:18 AM  Result Value Ref Range   Preg Test, Ur NEGATIVE NEGATIVE  Blood gas, venous     Status: Abnormal   Collection Time: 07/09/16 12:11 PM  Result Value Ref Range   pH, Ven 7.40 7.250 - 7.430   pCO2, Ven 41 (L) 44.0 - 60.0 mmHg   pO2, Ven 46.0 (H) 32.0 - 45.0 mmHg   Bicarbonate 25.4 20.0 - 28.0 mmol/L   Acid-Base Excess 0.5 0.0 - 2.0 mmol/L   O2 Saturation 81.7 %   Patient temperature 37.0    Collection site VEIN    Sample type VENOUS     Current Facility-Administered Medications  Medication Dose Route Frequency Provider Last Rate Last Dose  . pantoprazole (PROTONIX) EC tablet 40 mg  40 mg Oral Daily Gonzella Lex, MD       Current Outpatient Prescriptions  Medication Sig Dispense Refill  . amphetamine-dextroamphetamine (ADDERALL) 10 MG tablet Take 1 tablet (10 mg total) by mouth every morning. 60 tablet 0  . ARIPiprazole (ABILIFY) 5 MG tablet Take 5 mg by mouth daily.    . Armodafinil 250 MG tablet Take 1 tablet (250 mg total) by mouth every morning. 30 tablet 5  . atenolol (TENORMIN) 50 MG tablet Take 50 mg by mouth at bedtime.    . diphenoxylate-atropine (LOMOTIL) 2.5-0.025 MG per tablet TAKE ONE TABLET BY MOUTH TWICE DAILY 60 tablet 1  . glycopyrrolate (ROBINUL) 2 MG tablet Take 1 tablet (2 mg total) by mouth 2 (two) times daily. 60 tablet 3  . lisinopril (PRINIVIL,ZESTRIL) 20 MG tablet Take 1 tablet (20 mg total) by mouth daily. 15 tablet 0  . ondansetron (ZOFRAN) 4 MG tablet Take 1 tab every 6 hours as needed for cramping, spasms diarrhea. 40 tablet 2  .  pantoprazole (PROTONIX) 40 MG tablet Take 40 mg by mouth 2 (two) times daily.    . Probiotic Product (ALIGN) 4 MG CAPS Take 1 capsule by mouth daily.    . Sodium Oxybate 500 MG/ML SOLN Starter dose of XYREM, 2.25 gram twice at night for 7 days, than 3 gram 7 days , two doses at night. Than 4.5 gram final dose , twice nightly. 270 mL 3  . venlafaxine XR (EFFEXOR-XR) 150 MG 24 hr capsule  Take 300 mg by mouth daily with breakfast.      Musculoskeletal: Strength & Muscle Tone: decreased Gait & Station: unable to stand Patient leans: Backward  Psychiatric Specialty Exam: Physical Exam  Nursing note and vitals reviewed. Constitutional: She appears well-developed and well-nourished.  HENT:  Head: Normocephalic and atraumatic.  Eyes: Conjunctivae are normal. Pupils are equal, round, and reactive to light.  Neck: Normal range of motion.  Cardiovascular: Normal heart sounds.   Respiratory: She is in respiratory distress.  GI: Soft.  Musculoskeletal: Normal range of motion.  Skin: Skin is warm and dry.  Psychiatric: Her affect is blunt. Her speech is delayed and slurred. She is slowed and withdrawn. Cognition and memory are impaired. She expresses inappropriate judgment. She exhibits abnormal recent memory and abnormal remote memory.    Review of Systems  Unable to perform ROS: Patient unresponsive    Blood pressure 111/90, pulse 93, temperature 98.1 F (36.7 C), temperature source Oral, resp. rate (!) 21, weight 74.8 kg (165 lb), SpO2 98 %.Body mass index is 27.46 kg/m.  General Appearance: Casual  Eye Contact:  Minimal  Speech:  Slow and Slurred  Volume:  Decreased  Mood:  Dysphoric  Affect:  Constricted  Thought Process:  NA  Orientation:  Full (Time, Place, and Person)  Thought Content:  Asian answered questions without psychosis but she is so sedated it's hard to get much other detail out of her.  Suicidal Thoughts:  Yes.  with intent/plan  Homicidal Thoughts:  No  Memory:   Immediate;   Poor Recent;   Poor Remote;   Poor  Judgement:  Impaired  Insight:  Fair  Psychomotor Activity:  Decreased  Concentration:  Concentration: Poor  Recall:  Poor  Fund of Knowledge:  Negative  Language:  Negative  Akathisia:  Negative  Handed:  Right  AIMS (if indicated):     Assets:  Housing Social Support  ADL's:  Impaired  Cognition:  Impaired,  Moderate  Sleep:        Treatment Plan Summary: Daily contact with patient to assess and evaluate symptoms and progress in treatment, Medication management and Plan This is a 40 year old woman who took an overdose of Xanax with presumably suicidal intent. History of depression and anxiety. Patient continues to be extremely sedated even 6 hours after taking an overdose. She is still on nasal cannula oxygen right now and only saturating in the high 90s. Case reviewed with emergency room staff and TTS. Patient appears to be an appropriate candidate for admission to the psychiatric ward but will need to wake up a little bit better and be able to come off of her oxygen. It sounds like at this point they are not sure if they will admit her to the medical service or not. Continue IVC. I will sign out the patient to the psychiatrist on call for the consult service over the weekend. Please contact that person if patient is ready for possible transfer to psychiatry.  Disposition: Recommend psychiatric Inpatient admission when medically cleared. Supportive therapy provided about ongoing stressors.  Alethia Berthold, MD 07/09/2016 4:24 PM

## 2016-07-09 NOTE — ED Notes (Signed)
Pts daughter requesting to be contacted by psychiatrist when evaluation is done, Rhona Raider [phone]

## 2016-07-09 NOTE — ED Notes (Signed)
Pts therapist: Nancy Marus [phone]

## 2016-07-09 NOTE — ED Notes (Signed)
Pt drowsy but arousable, sitter and family at bedside. NAD noted

## 2016-07-09 NOTE — Progress Notes (Signed)
All power cords taped

## 2016-07-10 LAB — BASIC METABOLIC PANEL
Anion gap: 6 (ref 5–15)
BUN: <5 mg/dL — ABNORMAL LOW (ref 6–20)
CO2: 29 mmol/L (ref 22–32)
Calcium: 8 mg/dL — ABNORMAL LOW (ref 8.9–10.3)
Chloride: 106 mmol/L (ref 101–111)
Creatinine, Ser: 0.75 mg/dL (ref 0.44–1.00)
GFR calc Af Amer: >60 mL/min (ref >60)
GFR calc non Af Amer: >60 mL/min (ref >60)
Glucose, Bld: 138 mg/dL — ABNORMAL HIGH (ref 65–99)
Potassium: 3.1 mmol/L — ABNORMAL LOW (ref 3.5–5.1)
Sodium: 141 mmol/L (ref 135–145)

## 2016-07-10 LAB — GLUCOSE, CAPILLARY
Glucose-Capillary: 119 mg/dL — ABNORMAL HIGH (ref 65–99)
Glucose-Capillary: 130 mg/dL — ABNORMAL HIGH (ref 65–99)
Glucose-Capillary: 148 mg/dL — ABNORMAL HIGH (ref 65–99)
Glucose-Capillary: 110 mg/dL — ABNORMAL HIGH (ref 65–99)
Glucose-Capillary: 86 mg/dL (ref 65–99)

## 2016-07-10 LAB — CBC
HCT: 38.4 % (ref 35.0–47.0)
Hemoglobin: 12.8 g/dL (ref 12.0–16.0)
MCH: 27.2 pg (ref 26.0–34.0)
MCHC: 33.4 g/dL (ref 32.0–36.0)
MCV: 81.5 fL (ref 80.0–100.0)
Platelets: 247 10*3/uL (ref 150–440)
RBC: 4.71 MIL/uL (ref 3.80–5.20)
RDW: 14.8 % — ABNORMAL HIGH (ref 11.5–14.5)
WBC: 8.6 10*3/uL (ref 3.6–11.0)

## 2016-07-10 LAB — HEMOGLOBIN A1C
Hgb A1c MFr Bld: 8.3 % — ABNORMAL HIGH (ref 4.8–5.6)
Mean Plasma Glucose: 192 mg/dL

## 2016-07-10 MED ORDER — GLIPIZIDE ER 2.5 MG PO TB24: 2.5000 mg | ORAL_TABLET | Freq: Every day | ORAL | 0 refills | Status: DC

## 2016-07-10 MED ORDER — GLIPIZIDE ER 2.5 MG PO TB24
2.5000 mg | ORAL_TABLET | Freq: Every day | ORAL | Status: DC
  Administered 2016-07-11: 2.5 mg via ORAL
  Filled 2016-07-10: qty 1

## 2016-07-10 MED ORDER — POTASSIUM CHLORIDE CRYS ER 20 MEQ PO TBCR
40.0000 meq | EXTENDED_RELEASE_TABLET | Freq: Two times a day (BID) | ORAL | Status: AC
  Administered 2016-07-10 (×2): 40 meq via ORAL
  Filled 2016-07-10 (×2): qty 2

## 2016-07-10 NOTE — Progress Notes (Signed)
Blood pressure on wall 1

## 2016-07-10 NOTE — Progress Notes (Signed)
Internist MD stated ok to discharge to [facility] once psych has re-evaluated.

## 2016-07-10 NOTE — Progress Notes (Signed)
[facility] NAME: Christine Cook    MR#:  [id number]  DATE OF BIRTH:  1976/05/25  SUBJECTIVE:  CHIEF COMPLAINT:   Chief Complaint  Patient presents with  . Drug Overdose     Have depression, did overdose on her xanax- brought drowsy. On IVC.  Completely alert and oriented this morning, appears flat affect, slow, no complains.  REVIEW OF SYSTEMS:  CONSTITUTIONAL: No fever, fatigue or weakness.  EYES: No blurred or double vision.  EARS, NOSE, AND THROAT: No tinnitus or ear pain.  RESPIRATORY: No cough, shortness of breath, wheezing or hemoptysis.  CARDIOVASCULAR: No chest pain, orthopnea, edema.  GASTROINTESTINAL: No nausea, vomiting, diarrhea or abdominal pain.  GENITOURINARY: No dysuria, hematuria.  ENDOCRINE: No polyuria, nocturia,  HEMATOLOGY: No anemia, easy bruising or bleeding SKIN: No rash or lesion. MUSCULOSKELETAL: No joint pain or arthritis.   NEUROLOGIC: No tingling, numbness, weakness.  PSYCHIATRY: No anxiety or depression.   ROS  DRUG ALLERGIES:   Allergies  Allergen Reactions  . Codeine   . Morphine   . Neurontin [Gabapentin] Other (See Comments)    syncope    VITALS:  Blood pressure 128/85, pulse 94, temperature 98.4 F (36.9 C), resp. rate (!) 22, height 5\' 5"  (1.651 m), weight 79.3 kg (174 lb 13.2 oz), SpO2 98 %.  PHYSICAL EXAMINATION:  GENERAL:  40 y.o.-year-old obese patient lying in the bed with no acute distress.  EYES: Pupils equal, round, reactive to light and accommodation. No scleral icterus. Extraocular muscles intact.  HEENT: Head atraumatic, normocephalic. Oropharynx and nasopharynx clear.  NECK:  Supple, no jugular venous distention. No thyroid enlargement, no tenderness.  LUNGS: Normal breath sounds bilaterally, no wheezing, rales,rhonchi or crepitation. No use of accessory muscles of respiration.  CARDIOVASCULAR: S1, S2 normal. No murmurs, rubs, or gallops.  ABDOMEN: Soft, nontender,  nondistended. Bowel sounds present. No organomegaly or mass.  EXTREMITIES: No pedal edema, cyanosis, or clubbing.  NEUROLOGIC: Cranial nerves II through XII are intact. Muscle strength 5/5 in all extremities. Sensation intact. Gait not checked.  PSYCHIATRIC: The patient is alert and oriented x 3. Appears depressed. SKIN: No obvious rash, lesion, or ulcer.   Physical Exam LABORATORY PANEL:   CBC  Recent Labs Lab 07/10/16 0443  WBC 8.6  HGB 12.8  HCT 38.4  PLT 247   ------------------------------------------------------------------------------------------------------------------  Chemistries   Recent Labs Lab 07/09/16 1118 07/10/16 0443  NA 138 141  K 3.2* 3.1*  CL 104 106  CO2 28 29  GLUCOSE 144* 138*  BUN <5* <5*  CREATININE 0.72 0.75  CALCIUM 8.7* 8.0*  MG 1.8  --   AST 19  --   ALT 16  --   ALKPHOS 74  --   BILITOT 0.6  --    ------------------------------------------------------------------------------------------------------------------  Cardiac Enzymes No results for input(s): TROPONINI in the last 168 hours. ------------------------------------------------------------------------------------------------------------------  RADIOLOGY:  Dg Chest 1 View  Result Date: 07/09/2016 CLINICAL DATA:  Altered mental status. EXAM: CHEST 1 VIEW COMPARISON:  No recent prior . FINDINGS: Mediastinum and hilar structures normal. Low lung volumes with mild bibasilar atelectasis. Mild cardiomegaly with normal pulmonary vascularity. No pleural effusion or pneumothorax . IMPRESSION: Low lung volumes with mild bibasilar atelectasis. Electronically Signed   By: [place]   On: 07/09/2016 11:13    ASSESSMENT AND PLAN:   Principal Problem:   Overdose of benzodiazepine Active Problems:   GERD   Major depression   Suicidal ideation   Involuntary  commitment   Hypertension   Toxic metabolic encephalopathy   Benzodiazepine overdose   Suicidal intent   Depression    Diarrhea   Hypokalemia   Hyperglycemia   #1. Toxic metabolic encephalopathy due to hypercapnia, benzodiazepines,   Monitored in stepdown unit, improved with supportive therapy    Completely stable now- medically cleared for admission to inpatient psych.  #2 suicidal intent, IVC, psychiatric consultation,will benefit from psychiatric admission   #3. Diarrhea per report, only one Bm since admission, less likely to be infection. #4. Hypokalemia, supplement intravenously, normal magnesium level, supplement  #5. Hyperglycemia, high hemoglobin A1c - start oral glipizide. #6. History of depression, psychiatry consultation  Medically stable, can transfer to psych inpatient.  All the records are reviewed and case discussed with Care Management/Social Workerr. Management plans discussed with the patient, family and they are in agreement.  CODE STATUS: Full.  TOTAL TIME TAKING CARE OF THIS PATIENT: 35 minutes.    POSSIBLE D/C IN 1 DAYS, DEPENDING ON CLINICAL CONDITION.   Vaughan Basta M.D on 07/10/2016   Between 7am to 6pm - Pager - [phone]  After 6pm go to [email or web address] - password [email or web address] [facility]  Office  [phone]  CC: Primary care physician; Marton Redwood, MD  Note: This dictation was prepared with Dragon dictation along with smaller phrase technology. Any transcriptional errors that result from this process are unintentional.

## 2016-07-10 NOTE — Progress Notes (Signed)
Pt is awake and alert and following commands , visiting with family. She did gag /vomit a little with po large meds No pill fragments noted in vomit, and  family state this is not a new problem. Foley removed this am, has been up to [facility] and voided more than 100 mls post  Foley removal. Diet changed, and pt transferred to floor in stable condition, report called to [place].

## 2016-07-11 ENCOUNTER — Inpatient Hospital Stay
Admission: RE | Admit: 2016-07-11 | Discharge: 2016-07-20 | DRG: 881 | Disposition: A | Discharge status: Home / Self Care | Payer: Self-pay | Source: Intra-hospital | Attending: Psychiatry | Admitting: Psychiatry

## 2016-07-11 DIAGNOSIS — F17210 Nicotine dependence, cigarettes, uncomplicated: Secondary | ICD-10-CM | POA: Diagnosis present

## 2016-07-11 DIAGNOSIS — E559 Vitamin D deficiency, unspecified: Secondary | ICD-10-CM

## 2016-07-11 DIAGNOSIS — E119 Type 2 diabetes mellitus without complications: Secondary | ICD-10-CM

## 2016-07-11 DIAGNOSIS — F630 Pathological gambling: Secondary | ICD-10-CM | POA: Diagnosis present

## 2016-07-11 DIAGNOSIS — F332 Major depressive disorder, recurrent severe without psychotic features: Secondary | ICD-10-CM

## 2016-07-11 DIAGNOSIS — Z23 Encounter for immunization: Secondary | ICD-10-CM

## 2016-07-11 DIAGNOSIS — Z8249 Family history of ischemic heart disease and other diseases of the circulatory system: Secondary | ICD-10-CM

## 2016-07-11 DIAGNOSIS — F32A Depression, unspecified: Secondary | ICD-10-CM | POA: Diagnosis present

## 2016-07-11 DIAGNOSIS — Z62810 Personal history of physical and sexual abuse in childhood: Secondary | ICD-10-CM | POA: Diagnosis present

## 2016-07-11 DIAGNOSIS — G4733 Obstructive sleep apnea (adult) (pediatric): Secondary | ICD-10-CM

## 2016-07-11 DIAGNOSIS — G47419 Narcolepsy without cataplexy: Secondary | ICD-10-CM

## 2016-07-11 DIAGNOSIS — Z79899 Other long term (current) drug therapy: Secondary | ICD-10-CM

## 2016-07-11 DIAGNOSIS — G4700 Insomnia, unspecified: Secondary | ICD-10-CM | POA: Diagnosis present

## 2016-07-11 DIAGNOSIS — I10 Essential (primary) hypertension: Secondary | ICD-10-CM | POA: Diagnosis present

## 2016-07-11 DIAGNOSIS — F429 Obsessive-compulsive disorder, unspecified: Secondary | ICD-10-CM | POA: Diagnosis present

## 2016-07-11 DIAGNOSIS — E7800 Pure hypercholesterolemia, unspecified: Secondary | ICD-10-CM | POA: Diagnosis present

## 2016-07-11 DIAGNOSIS — F4310 Post-traumatic stress disorder, unspecified: Secondary | ICD-10-CM | POA: Diagnosis present

## 2016-07-11 DIAGNOSIS — T424X1A Poisoning by benzodiazepines, accidental (unintentional), initial encounter: Secondary | ICD-10-CM | POA: Diagnosis present

## 2016-07-11 DIAGNOSIS — Z825 Family history of asthma and other chronic lower respiratory diseases: Secondary | ICD-10-CM

## 2016-07-11 DIAGNOSIS — K760 Fatty (change of) liver, not elsewhere classified: Secondary | ICD-10-CM | POA: Diagnosis present

## 2016-07-11 DIAGNOSIS — Z833 Family history of diabetes mellitus: Secondary | ICD-10-CM

## 2016-07-11 DIAGNOSIS — F639 Impulse disorder, unspecified: Secondary | ICD-10-CM

## 2016-07-11 DIAGNOSIS — F329 Major depressive disorder, single episode, unspecified: Principal | ICD-10-CM | POA: Diagnosis present

## 2016-07-11 DIAGNOSIS — K219 Gastro-esophageal reflux disease without esophagitis: Secondary | ICD-10-CM | POA: Diagnosis present

## 2016-07-11 DIAGNOSIS — Z7984 Long term (current) use of oral hypoglycemic drugs: Secondary | ICD-10-CM

## 2016-07-11 DIAGNOSIS — E538 Deficiency of other specified B group vitamins: Secondary | ICD-10-CM

## 2016-07-11 DIAGNOSIS — K589 Irritable bowel syndrome without diarrhea: Secondary | ICD-10-CM | POA: Diagnosis present

## 2016-07-11 DIAGNOSIS — T50902D Poisoning by unspecified drugs, medicaments and biological substances, intentional self-harm, subsequent encounter: Secondary | ICD-10-CM

## 2016-07-11 DIAGNOSIS — H04123 Dry eye syndrome of bilateral lacrimal glands: Secondary | ICD-10-CM | POA: Diagnosis present

## 2016-07-11 DIAGNOSIS — E785 Hyperlipidemia, unspecified: Secondary | ICD-10-CM | POA: Diagnosis present

## 2016-07-11 LAB — GASTROINTESTINAL PANEL BY PCR, STOOL (REPLACES STOOL CULTURE)

## 2016-07-11 LAB — BASIC METABOLIC PANEL
Anion gap: 7 (ref 5–15)
BUN: <5 mg/dL — ABNORMAL LOW (ref 6–20)
CO2: 25 mmol/L (ref 22–32)
Calcium: 8.7 mg/dL — ABNORMAL LOW (ref 8.9–10.3)
Chloride: 104 mmol/L (ref 101–111)
Creatinine, Ser: 0.7 mg/dL (ref 0.44–1.00)
GFR calc Af Amer: >60 mL/min (ref >60)
GFR calc non Af Amer: >60 mL/min (ref >60)
Glucose, Bld: 129 mg/dL — ABNORMAL HIGH (ref 65–99)
Potassium: 3.3 mmol/L — ABNORMAL LOW (ref 3.5–5.1)
Sodium: 136 mmol/L (ref 135–145)

## 2016-07-11 LAB — C DIFFICILE QUICK SCREEN W PCR REFLEX
C Diff antigen: NEGATIVE
C Diff interpretation: NOT DETECTED
C Diff toxin: NEGATIVE

## 2016-07-11 LAB — GLUCOSE, CAPILLARY
Glucose-Capillary: 123 mg/dL — ABNORMAL HIGH (ref 65–99)
Glucose-Capillary: 82 mg/dL (ref 65–99)
Glucose-Capillary: 89 mg/dL (ref 65–99)

## 2016-07-11 MED ORDER — ACETAMINOPHEN 325 MG PO TABS
650.0000 mg | ORAL_TABLET | Freq: Four times a day (QID) | ORAL | Status: DC | PRN
  Administered 2016-07-11 – 2016-07-15 (×4): 650 mg via ORAL
  Filled 2016-07-11 (×4): qty 2

## 2016-07-11 MED ORDER — INSULIN ASPART 100 UNIT/ML ~~LOC~~ SOLN
0.0000 [IU] | Freq: Three times a day (TID) | SUBCUTANEOUS | Status: DC
  Administered 2016-07-12: 1 [IU] via SUBCUTANEOUS
  Administered 2016-07-12 – 2016-07-13 (×3): 2 [IU] via SUBCUTANEOUS
  Administered 2016-07-13: 1 [IU] via SUBCUTANEOUS
  Administered 2016-07-14: 2 [IU] via SUBCUTANEOUS
  Administered 2016-07-14 – 2016-07-15 (×3): 1 [IU] via SUBCUTANEOUS
  Administered 2016-07-15 – 2016-07-16 (×2): 2 [IU] via SUBCUTANEOUS
  Administered 2016-07-16: 1 [IU] via SUBCUTANEOUS
  Administered 2016-07-16: 3 [IU] via SUBCUTANEOUS
  Administered 2016-07-17: 2 [IU] via SUBCUTANEOUS
  Administered 2016-07-17: 3 [IU] via SUBCUTANEOUS
  Administered 2016-07-18 (×2): 2 [IU] via SUBCUTANEOUS
  Administered 2016-07-19: 1 [IU] via SUBCUTANEOUS
  Administered 2016-07-19: 3 [IU] via SUBCUTANEOUS
  Administered 2016-07-20: 1 [IU] via SUBCUTANEOUS
  Filled 2016-07-11: qty 1
  Filled 2016-07-11: qty 2
  Filled 2016-07-11 (×3): qty 1
  Filled 2016-07-11: qty 2
  Filled 2016-07-11: qty 3
  Filled 2016-07-11 (×3): qty 2
  Filled 2016-07-11: qty 1
  Filled 2016-07-11: qty 3
  Filled 2016-07-11 (×4): qty 1
  Filled 2016-07-11: qty 3
  Filled 2016-07-11: qty 2

## 2016-07-11 MED ORDER — GLIPIZIDE ER 2.5 MG PO TB24
2.5000 mg | ORAL_TABLET | Freq: Every day | ORAL | Status: DC
  Administered 2016-07-12 – 2016-07-20 (×9): 2.5 mg via ORAL
  Filled 2016-07-11 (×9): qty 1

## 2016-07-11 MED ORDER — ALUM & MAG HYDROXIDE-SIMETH 200-200-20 MG/5ML PO SUSP
30.0000 mL | ORAL | Status: DC | PRN
  Administered 2016-07-13: 30 mL via ORAL
  Filled 2016-07-11: qty 30

## 2016-07-11 MED ORDER — INSULIN ASPART 100 UNIT/ML ~~LOC~~ SOLN
0.0000 [IU] | Freq: Every day | SUBCUTANEOUS | Status: DC
  Filled 2016-07-11: qty 1

## 2016-07-11 MED ORDER — NICOTINE 21 MG/24HR TD PT24
21.0000 mg | MEDICATED_PATCH | Freq: Every day | TRANSDERMAL | Status: DC
  Administered 2016-07-11 – 2016-07-20 (×11): 21 mg via TRANSDERMAL
  Filled 2016-07-11 (×10): qty 1

## 2016-07-11 MED ORDER — PANTOPRAZOLE SODIUM 40 MG PO TBEC
40.0000 mg | DELAYED_RELEASE_TABLET | Freq: Every day | ORAL | Status: DC
  Administered 2016-07-12 – 2016-07-13 (×2): 40 mg via ORAL
  Filled 2016-07-11 (×2): qty 1

## 2016-07-11 MED ORDER — MAGNESIUM HYDROXIDE 400 MG/5ML PO SUSP: 30.0000 mL | Freq: Every day | ORAL | Status: DC | PRN

## 2016-07-11 MED ORDER — SALINE SPRAY 0.65 % NA SOLN
1.0000 | NASAL | Status: DC | PRN
  Filled 2016-07-11: qty 44

## 2016-07-11 MED ORDER — FLUOXETINE HCL 20 MG PO CAPS
20.0000 mg | ORAL_CAPSULE | Freq: Every day | ORAL | Status: DC
  Administered 2016-07-11 – 2016-07-17 (×7): 20 mg via ORAL
  Filled 2016-07-11 (×7): qty 1

## 2016-07-11 MED ORDER — ATENOLOL 50 MG PO TABS
50.0000 mg | ORAL_TABLET | Freq: Every day | ORAL | Status: DC
  Administered 2016-07-11 – 2016-07-19 (×9): 50 mg via ORAL
  Filled 2016-07-11 (×9): qty 1

## 2016-07-11 MED ORDER — LOPERAMIDE HCL 2 MG PO CAPS: 2.0000 mg | ORAL_CAPSULE | Freq: Four times a day (QID) | ORAL | 0 refills | Status: DC | PRN

## 2016-07-11 MED ORDER — INFLUENZA VAC SPLIT QUAD 0.5 ML IM SUSY
0.5000 mL | PREFILLED_SYRINGE | INTRAMUSCULAR | Status: AC
  Administered 2016-07-12: 0.5 mL via INTRAMUSCULAR
  Filled 2016-07-11: qty 0.5

## 2016-07-11 MED ORDER — HYDROXYZINE HCL 25 MG PO TABS
25.0000 mg | ORAL_TABLET | Freq: Three times a day (TID) | ORAL | Status: DC | PRN
  Administered 2016-07-11: 25 mg via ORAL
  Filled 2016-07-11: qty 1

## 2016-07-11 MED ORDER — RISAQUAD PO CAPS
1.0000 | ORAL_CAPSULE | Freq: Every day | ORAL | Status: DC
  Administered 2016-07-12 – 2016-07-20 (×9): 1 via ORAL
  Filled 2016-07-11 (×9): qty 1

## 2016-07-11 MED ORDER — LOPERAMIDE HCL 2 MG PO CAPS: 2.0000 mg | ORAL_CAPSULE | Freq: Four times a day (QID) | ORAL | Status: DC | PRN

## 2016-07-11 NOTE — Progress Notes (Signed)
Discharge summary reviewed with patient with verbal understanding.

## 2016-07-11 NOTE — Progress Notes (Signed)
Called report to [place].

## 2016-07-11 NOTE — Progress Notes (Signed)
Admission Note:   Patient arrived to the unit at approximately 1800 pm.  Patient dressed in hospital gown and patient gait was steady.  Patient denies SI/AVH/HI at this time.  Patient skin assessment completed with another staff present and no issues were found with her skin.  Patient has tattoos on breast and lower back.  Patient belongings logged and searched by staff.  Patient oriented to her room and to the unit.

## 2016-07-11 NOTE — BHH Suicide Risk Assessment (Signed)
[facility] Admission Suicide Risk Assessment   Nursing information obtained from:  Patient Demographic factors:    Current Mental Status:    Loss Factors:    Historical Factors:    Risk Reduction Factors:     Total Time spent with patient: 1 hour Principal Problem: MDD (major depressive disorder) Diagnosis:   Patient Active Problem List   Diagnosis Date Noted  . Diabetes ([place]) [E11.9] 07/11/2016  . MDD (major depressive disorder) [F32.9] 07/11/2016  . Hypertension [I10] 07/09/2016  . Benzodiazepine overdose [T42.4X1A] 07/09/2016  . HYPERCHOLESTEROLEMIA [E78.00] 01/21/2009  . GERD [K21.9] 01/21/2009   Subjective Data:   Continued Clinical Symptoms:  Alcohol Use Disorder Identification Test Final Score (AUDIT): 0 The "Alcohol Use Disorders Identification Test", Guidelines for Use in Primary Care, Second Edition.  World Pharmacologist [facility]). Score between 0-7:  no or low risk or alcohol related problems. Score between 8-15:  moderate risk of alcohol related problems. Score between 16-19:  high risk of alcohol related problems. Score 20 or above:  warrants further diagnostic evaluation for alcohol dependence and treatment.   CLINICAL FACTORS:   Depression:   Impulsivity Severe More than one psychiatric diagnosis Previous Psychiatric Diagnoses and Treatments     Psychiatric Specialty Exam: Physical Exam  ROS  Blood pressure 121/75, pulse (!) 110, temperature 98.2 F (36.8 C), temperature source Oral, resp. rate 16, height 5\' 5"  (1.651 m), weight 77.6 kg (171 lb), SpO2 98 %.Body mass index is 28.46 kg/m.                                                    Sleep:         COGNITIVE FEATURES THAT CONTRIBUTE TO RISK:  None    SUICIDE RISK:   Moderate:  Frequent suicidal ideation with limited intensity, and duration, some specificity in terms of plans, no associated intent, good self-control, limited dysphoria/symptomatology, some risk factors  present, and identifiable protective factors, including available and accessible social support.   PLAN OF CARE: admit to [facility]  I certify that inpatient services furnished can reasonably be expected to improve the patient's condition.  Hildred Priest, MD 07/11/2016, 7:03 PM

## 2016-07-11 NOTE — BH Assessment (Signed)
Patient is to be admitted to [place] by Dr. Jerilee Hoh.  Attending Physician will be Dr. Jerilee Hoh.   Patient has been assigned to room 316-B, by [place].   Intake Paper Work has been signed and placed on patient chart.

## 2016-07-11 NOTE — Tx Team (Signed)
Initial Treatment Plan 07/11/2016 6:48 PM Vonna Drafts [id number]    PATIENT STRESSORS: Financial difficulties Legal issue   PATIENT STRENGTHS: Communication skills General fund of knowledge Supportive family/friends   PATIENT IDENTIFIED PROBLEMS: Major Depression  Anxiety  Overdose of Benzodiazepines                 DISCHARGE CRITERIA:  Improved stabilization in mood, thinking, and/or behavior Medical problems require only outpatient monitoring Motivation to continue treatment in a less acute level of care  PRELIMINARY DISCHARGE PLAN: Outpatient therapy  PATIENT/FAMILY INVOLVEMENT: This treatment plan has been presented to and reviewed with the patient, Christine Cook, and/or family member.  The patient and family have been given the opportunity to ask questions and make suggestions.  Andria Frames, RN 07/11/2016, 6:48 PM
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
## 2016-07-11 NOTE — Progress Notes (Signed)
Patient complaining of nasal passage being dry, stomach pain, diarrhea. Family very attentive. Awaiting psych rounding.

## 2016-07-11 NOTE — Progress Notes (Signed)
[facility] NAME: Christine Cook    MR#:  [id number]  DATE OF BIRTH:  Dec 08, 1975  SUBJECTIVE:  CHIEF COMPLAINT:   Chief Complaint  Patient presents with  . Drug Overdose     Have depression, did overdose on her xanax- brought drowsy. On IVC.  Completely alert and oriented this morning, appears flat affect, slow, no complains.  was crying today with presence of her daughter in room.  REVIEW OF SYSTEMS:  CONSTITUTIONAL: No fever, fatigue or weakness.  EYES: No blurred or double vision.  EARS, NOSE, AND THROAT: No tinnitus or ear pain.  RESPIRATORY: No cough, shortness of breath, wheezing or hemoptysis.  CARDIOVASCULAR: No chest pain, orthopnea, edema.  GASTROINTESTINAL: No nausea, vomiting, diarrhea or abdominal pain.  GENITOURINARY: No dysuria, hematuria.  ENDOCRINE: No polyuria, nocturia,  HEMATOLOGY: No anemia, easy bruising or bleeding SKIN: No rash or lesion. MUSCULOSKELETAL: No joint pain or arthritis.   NEUROLOGIC: No tingling, numbness, weakness.  PSYCHIATRY: No anxiety or depression.   ROS  DRUG ALLERGIES:   Allergies  Allergen Reactions  . Codeine   . Morphine   . Neurontin [Gabapentin] Other (See Comments)    syncope    VITALS:  Blood pressure 129/77, pulse 96, temperature 98.5 F (36.9 C), temperature source Oral, resp. rate 18, height 5\' 5"  (1.651 m), weight 79.3 kg (174 lb 13.2 oz), SpO2 97 %.  PHYSICAL EXAMINATION:  GENERAL:  40 y.o.-year-old obese patient lying in the bed with no acute distress.  EYES: Pupils equal, round, reactive to light and accommodation. No scleral icterus. Extraocular muscles intact.  HEENT: Head atraumatic, normocephalic. Oropharynx and nasopharynx clear.  NECK:  Supple, no jugular venous distention. No thyroid enlargement, no tenderness.  LUNGS: Normal breath sounds bilaterally, no wheezing, rales,rhonchi or crepitation. No use of accessory muscles of respiration.   CARDIOVASCULAR: S1, S2 normal. No murmurs, rubs, or gallops.  ABDOMEN: Soft, nontender, nondistended. Bowel sounds present. No organomegaly or mass.  EXTREMITIES: No pedal edema, cyanosis, or clubbing.  NEUROLOGIC: Cranial nerves II through XII are intact. Muscle strength 5/5 in all extremities. Sensation intact. Gait not checked.  PSYCHIATRIC: The patient is alert and oriented x 3. Appears depressed. SKIN: No obvious rash, lesion, or ulcer.   Physical Exam LABORATORY PANEL:   CBC  Recent Labs Lab 07/10/16 0443  WBC 8.6  HGB 12.8  HCT 38.4  PLT 247   ------------------------------------------------------------------------------------------------------------------  Chemistries   Recent Labs Lab 07/09/16 1118  07/11/16 1129  NA 138  < > 136  K 3.2*  < > 3.3*  CL 104  < > 104  CO2 28  < > 25  GLUCOSE 144*  < > 129*  BUN <5*  < > <5*  CREATININE 0.72  < > 0.70  CALCIUM 8.7*  < > 8.7*  MG 1.8  --   --   AST 19  --   --   ALT 16  --   --   ALKPHOS 74  --   --   BILITOT 0.6  --   --   < > = values in this interval not displayed. ------------------------------------------------------------------------------------------------------------------  Cardiac Enzymes No results for input(s): TROPONINI in the last 168 hours. ------------------------------------------------------------------------------------------------------------------  RADIOLOGY:  No results found.  ASSESSMENT AND PLAN:   Principal Problem:   Overdose of benzodiazepine Active Problems:   GERD   Major depression   Suicidal ideation   Involuntary commitment   Hypertension   Toxic metabolic  encephalopathy   Benzodiazepine overdose   Suicidal intent   Depression   Diarrhea   Hypokalemia   Hyperglycemia   #1. Toxic metabolic encephalopathy due to hypercapnia, benzodiazepines,   Monitored in stepdown unit, improved with supportive therapy    Completely stable now- medically cleared for admission  to inpatient psych.   I called Dr. Jerilee Hoh today to follow on her.  #2 suicidal intent, IVC, psychiatric consultation,will benefit from psychiatric admission   #3. Diarrhea per report, , less likely to be infection.   She have IBS- give imodium. #4. Hypokalemia, supplement intravenously, normal magnesium level, supplement  #5. Hyperglycemia, high hemoglobin A1c - start oral glipizide. #6. History of depression, psychiatry consultation  Medically stable, can transfer to psych inpatient.  All the records are reviewed and case discussed with Care Management/Social Workerr. Management plans discussed with the patient, family and they are in agreement.  CODE STATUS: Full.  TOTAL TIME TAKING CARE OF THIS PATIENT: 35 minutes.    POSSIBLE D/C IN 1 DAYS, DEPENDING ON CLINICAL CONDITION.   Vaughan Basta M.D on 07/11/2016   Between 7am to 6pm - Pager - [phone]  After 6pm go to [email or web address] - password [email or web address] [email or web address]  Office  [phone]  CC: Primary care physician; Marton Redwood, MD  Note: This dictation was prepared with Dragon dictation along with smaller phrase technology. Any transcriptional errors that result from this process are unintentional.

## 2016-07-12 DIAGNOSIS — G47419 Narcolepsy without cataplexy: Secondary | ICD-10-CM

## 2016-07-12 DIAGNOSIS — E559 Vitamin D deficiency, unspecified: Secondary | ICD-10-CM

## 2016-07-12 DIAGNOSIS — E538 Deficiency of other specified B group vitamins: Secondary | ICD-10-CM

## 2016-07-12 DIAGNOSIS — G4733 Obstructive sleep apnea (adult) (pediatric): Secondary | ICD-10-CM

## 2016-07-12 LAB — GLUCOSE, CAPILLARY
Glucose-Capillary: 149 mg/dL — ABNORMAL HIGH (ref 65–99)
Glucose-Capillary: 109 mg/dL — ABNORMAL HIGH (ref 65–99)
Glucose-Capillary: 166 mg/dL — ABNORMAL HIGH (ref 65–99)
Glucose-Capillary: 116 mg/dL — ABNORMAL HIGH (ref 65–99)

## 2016-07-12 LAB — VITAMIN B12: Vitamin B-12: 137 pg/mL — ABNORMAL LOW (ref 180–914)

## 2016-07-12 LAB — TSH: TSH: 3.278 u[IU]/mL (ref 0.350–4.500)

## 2016-07-12 MED ORDER — VENLAFAXINE HCL 37.5 MG PO TABS
75.0000 mg | ORAL_TABLET | Freq: Three times a day (TID) | ORAL | Status: DC
  Administered 2016-07-12 – 2016-07-17 (×15): 75 mg via ORAL
  Filled 2016-07-12 (×16): qty 2

## 2016-07-12 MED ORDER — CYANOCOBALAMIN 1000 MCG/ML IJ SOLN
1000.0000 ug | Freq: Every day | INTRAMUSCULAR | Status: AC
  Administered 2016-07-12 – 2016-07-18 (×7): 1000 ug via INTRAMUSCULAR
  Filled 2016-07-12 (×7): qty 1

## 2016-07-12 MED ORDER — CHOLECALCIFEROL 10 MCG (400 UNIT) PO TABS
400.0000 [IU] | ORAL_TABLET | Freq: Every day | ORAL | Status: DC
  Administered 2016-07-12 – 2016-07-20 (×9): 400 [IU] via ORAL
  Filled 2016-07-12 (×9): qty 1

## 2016-07-12 MED ORDER — MENTHOL 3 MG MT LOZG
1.0000 | LOZENGE | OROMUCOSAL | Status: DC | PRN
  Administered 2016-07-12 – 2016-07-13 (×2): 3 mg via ORAL
  Filled 2016-07-12 (×2): qty 9

## 2016-07-12 NOTE — [facility] Group Notes (Signed)
[facility] LCSW Group Therapy Note  Date/Time:  Type of Therapy and Topic:  Group Therapy:  Overcoming Obstacles  Participation Level:  Minimal, but attentive  Description of Group:    In this group patients will be encouraged to explore what they see as obstacles to their own wellness and recovery. They will be guided to discuss their thoughts, feelings, and behaviors related to these obstacles. The group will process together ways to cope with barriers, with attention given to specific choices patients can make. Each patient will be challenged to identify changes they are motivated to make in order to overcome their obstacles. This group will be process-oriented, with patients participating in exploration of their own experiences as well as giving and receiving support and challenge from other group members.  Therapeutic Goals: 1. Patient will identify personal and current obstacles as they relate to admission. 2. Patient will identify barriers that currently interfere with their wellness or overcoming obstacles.  3. Patient will identify feelings, thought process and behaviors related to these barriers. 4. Patient will identify two changes they are willing to make to overcome these obstacles:    Summary of Patient Progress  Minimal participation as she came in late but appeared attentive.  Therapeutic Modalities:   Cognitive Behavioral Therapy Solution Focused Therapy Motivational Interviewing Relapse Prevention Therapy  August Saucer, MSW, LCSW 07/12/2016, 5:12 PM

## 2016-07-12 NOTE — [facility] Group Notes (Signed)
[facility] Group Notes:  (Nursing/MHT/Case Management/Adjunct)  Date:  07/12/2016  Time:  2:56 AM  Type of Therapy:  Psychoeducational Skills  Participation Level:  Did Not Attend   Summary of Progress/Problems:  Reece Agar 07/12/2016, 2:56 AM

## 2016-07-12 NOTE — Progress Notes (Signed)
D: Pt denies SI/HI/AVH. Pt is pleasant and cooperative, affect flat and sad  Pt thoughts are organized and logical ppears less anxious and she is interacting staff appropriately.  A: Pt was offered support and encouragement. Pt was given scheduled medications. Pt was encouraged to attend groups. Q 15 minute checks were done for safety. Will continue to monitor.

## 2016-07-12 NOTE — [facility] Group Notes (Signed)
[facility] Group Notes:  (Nursing/MHT/Case Management/Adjunct)  Date:  07/12/2016  Time:  10:54 PM  Type of Therapy:  Group Therapy  Participation Level:  Active  Participation Quality:  Appropriate  Affect:  Appropriate  Cognitive:  Appropriate  Insight:  Appropriate  Engagement in Group:  Engaged  Modes of Intervention:  Discussion  Summary of Progress/Problems:  Kandis Fantasia 07/12/2016, 10:54 PM

## 2016-07-12 NOTE — Progress Notes (Signed)
Recreation Therapy Notes  Date: 10.09.17 Time: 9:30 am Location: Craft Room  Group Topic: Self-expression  Goal Area(s) Addresses:  Patient will identify one color per emotion listed on wheel. Patient will verbalize benefit of using art as a means of self-expression. Patient will verbalize one emotion experienced during session. Patient will be educated on other forms of self-expression.  Behavioral Response: Attentive, Interactive  Intervention: Emotion Wheel  Activity: Patients were given an Licensed conveyancer with 7 emotions listed. Patients were instructed to pick a color for each emotion.  Education: LRT educated patients on different forms of self-expression.  Education Outcome: Acknowledges education/In group clarification offered  Clinical Observations/Feedback: Patient completed activity by picking a color for each emotion. Patient contributed to group discussion by stating what colors she picked for certain emotions and why, how it felt to see her emotions in color, and what makes art a good form of self-expression.  Leonette Monarch, LRT/CTRS 07/12/2016 1:45 PM

## 2016-07-12 NOTE — Progress Notes (Signed)
Presents with sad, flat affect.  Verbalizes that she remains depressed and feels helpless and hopeless.  Denies SI at this time.  Verbalizes that she wants to learn coping mechanisms. Medication and group compliant.  Visible in the milieu.  Support and encouragement offered.  Safety maintained.

## 2016-07-12 NOTE — Plan of Care (Signed)
Problem: Coping: Goal: Ability to verbalize frustrations and anger appropriately will improve Outcome: Progressing [place] frustration
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
## 2016-07-12 NOTE — Progress Notes (Signed)
[facility] MD Progress Note  07/12/2016 2:04 PM Christine Cook  MRN:  [id number] Subjective:   39 y/o F admitted on 10/5 after overdosing on alprazolam (50 tabs). Pt with a h/o depression and gambling addiction. Currently dealing with legal charges and lost her job on 10/5.   Today the patient reports that she is feeling about the same. She is still feeling groggy, but thinks it is still the alprazolam in her system. She did not sleep well last night as she kept waking up, but recorded sleep time is 7.5 hrs. Her appetite has been good. She attenteded group this morning, and has voiced her desire to learn healthy coping mechanisms to deal with stress and the desire to gamble.  Mood is still depressed and anxious. She denies SI, HI or AVH. She denies any side effects from the medications.    Per nursing staff: D: Pt denies SI/HI/AVH. Pt is pleasant and cooperative, affect flat and sad  Pt thoughts are organized and logical ppears less anxious and she is interacting staff appropriately.  A: Pt was offered support and encouragement. Pt was given scheduled medications. Pt was encouraged to attend groups. Q 15 minute checks were done for safety. Will continue to monitor.    Principal Problem: MDD (major depressive disorder) Diagnosis:   Patient Active Problem List   Diagnosis Date Noted  . OSA (obstructive sleep apnea) [G47.33] 07/12/2016  . 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 Vitamin B12 deficiency [E53.8] 07/12/2016  . Vitamin D deficiency [E55.9] 07/12/2016  . Diabetes ([place]) [E11.9] 07/11/2016  . MDD (major depressive disorder) [F32.9] 07/11/2016  . Impulse control disorder (gambling) [F63.9] 07/11/2016  . Hypertension [I10] 07/09/2016  . Benzodiazepine overdose [T42.4X1A] 07/09/2016  . GERD [K21.9] 01/21/2009   Total Time spent with patient: 30 minutes  Past Psychiatric History:   Past Medical History:  Past Medical History:  Diagnosis Date  . Anxiety   . Depression   . Diabetes ([place])    . Fatty liver disease, nonalcoholic   . Heart murmur   . Heart palpitations   . High cholesterol   . 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 Hypertension   . Irritable bowel syndrome with diarrhea   . 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 Migraine   . 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 Sleep apnea with hypersomnolence    CPAP study 11-28-09,  10-02-2009 AHI was 16.5, pressure to   . Tachycardia     Past Surgical History:  Procedure Laterality Date  . ADENOIDECTOMY    . BLADDER SURGERY    . CHOLECYSTECTOMY    . KNEE SURGERY     Left knee x 2   . TONSILLECTOMY     Family History:  Family History  Problem Relation Age of Onset  . Asthma Mother   . Hypertension Father   . Coronary artery disease Father   . Diabetes Father   . Asthma Daughter   . Colon cancer Neg Hx    Family Psychiatric  History:   Social History:  History  Alcohol Use No     History  Drug Use No    Social History   Social History  . Marital status: Single    Spouse name: N/A  . Number of children: 1  . Years of education: College   Occupational History  . Support specialist   .  [employer]   Social History Main Topics  . Smoking status: Former Smoker  Packs/day: 0.50    Years: 2.00    Types: Cigarettes    Quit date: 02/14/1995  . Smokeless tobacco: Never Used  . Alcohol use No  . Drug use: No  . Sexual activity: Not Asked   Other Topics Concern  . None   Social History Narrative   Patient is single and lives alone.   Patient works at [employer].   Patient has a college education.   Patient has one child.   Patient is right-handed.   Patient drinks two 20 oz of Coke daily.   Additional Social History:      Sleep: Poor  Appetite:  Good  Current Medications: Current Facility-Administered Medications  Medication Dose Route Frequency Provider Last Rate Last Dose  . acetaminophen (TYLENOL) tablet 650 mg  650 mg Oral Q6H PRN Hildred Priest, MD   650 mg at 07/11/16 1848  . acidophilus (RISAQUAD) capsule 1 capsule  1 capsule Oral Daily Hildred Priest, MD   1 capsule at 07/12/16 0840  . alum & mag hydroxide-simeth (MAALOX/MYLANTA) 200-200-20 MG/5ML suspension 30 mL  30 mL Oral Q4H PRN Hildred Priest, MD      . atenolol (TENORMIN) tablet 50 mg  50 mg Oral QHS Hildred Priest, MD   50 mg at 07/11/16 2243  . cholecalciferol (VITAMIN D) tablet 400 Units  400 Units Oral Daily Hildred Priest, MD      . cyanocobalamin ((VITAMIN B-12)) injection 1,000 mcg  1,000 mcg Intramuscular Daily Hildred Priest, MD      . FLUoxetine (PROZAC) capsule 20 mg  20 mg Oral Daily Hildred Priest, MD   20 mg at 07/12/16 0840  . glipiZIDE (GLUCOTROL XL) 24 hr tablet 2.5 mg  2.5 mg Oral Q breakfast Hildred Priest, MD   2.5 mg at 07/12/16 0840  . insulin aspart (novoLOG) injection 0-5 Units  0-5 Units Subcutaneous QHS Hildred Priest, MD      . insulin aspart (novoLOG) injection 0-9 Units  0-9 Units Subcutaneous TID WC Hildred Priest, MD   1 Units at 07/12/16 0846  . magnesium hydroxide (MILK OF MAGNESIA) suspension 30 mL  30 mL Oral Daily PRN Hildred Priest, MD      . menthol-cetylpyridinium (CEPACOL) lozenge 3 mg  1 lozenge Oral PRN Hildred Priest, MD      . nicotine (NICODERM CQ - dosed in mg/24 hours) patch 21 mg  21 mg Transdermal Daily Hildred Priest, MD   21 mg at 07/12/16 0840  . pantoprazole (PROTONIX) EC tablet 40 mg  40 mg Oral Daily Hildred Priest, MD   40 mg at 07/12/16 0840  . venlafaxine (EFFEXOR) tablet 75 mg  75 mg Oral TID WC Hildred Priest, MD        Lab Results:  Results for orders placed or performed during the hospital encounter of 07/11/16 (from the past 48 hour(s))  Glucose, capillary     Status: None   Collection Time: 07/11/16 10:38 PM  Result Value Ref Range    Glucose-Capillary 89 65 - 99 mg/dL  TSH     Status: None   Collection Time: 07/12/16  6:40 AM  Result Value Ref Range   TSH 3.278 0.350 - 4.500 uIU/mL    Comment: Performed by a 3rd Generation assay with a functional sensitivity of <=0.01 uIU/mL.  Glucose, capillary     Status: Abnormal   Collection Time: 07/12/16  6:40 AM  Result Value Ref Range   Glucose-Capillary 149 (H) 65 - 99 mg/dL  Vitamin B12  Status: Abnormal   Collection Time: 07/12/16  6:43 AM  Result Value Ref Range   Vitamin B-12 137 (L) 180 - 914 pg/mL    Comment: (NOTE) This assay is not validated for testing neonatal or myeloproliferative syndrome specimens for Vitamin B12 levels. Performed at [facility]   Glucose, capillary     Status: Abnormal   Collection Time: 07/12/16 12:06 PM  Result Value Ref Range   Glucose-Capillary 109 (H) 65 - 99 mg/dL   Comment 1 Notify RN     Blood Alcohol level:  No results found for: [facility]  Metabolic Disorder Labs: Lab Results  Component Value Date   HGBA1C 8.3 (H) 07/09/2016   MPG 192 07/09/2016   MPG 143 12/04/2008   No results found for: PROLACTIN No results found for: CHOL, TRIG, HDL, CHOLHDL, VLDL, LDLCALC  Physical Findings: AIMS:  , ,  ,  ,    CIWA:    COWS:     Musculoskeletal: Strength & Muscle Tone: within normal limits Gait & Station: normal Patient leans: N/A  Psychiatric Specialty Exam: Physical Exam  Constitutional: She is oriented to person, place, and time. She appears well-developed and well-nourished.  HENT:  Head: Normocephalic and atraumatic.  Eyes: EOM are normal.  Neck: Normal range of motion.  Respiratory: Effort normal.  Musculoskeletal: Normal range of motion.  Neurological: She is alert and oriented to person, place, and time.    Review of Systems  HENT: Positive for sore throat.   Gastrointestinal: Positive for abdominal pain and nausea. Negative for constipation, diarrhea and vomiting.  Psychiatric/Behavioral: Positive  for depression. Negative for hallucinations, substance abuse and suicidal ideas. The patient is nervous/anxious and has insomnia.   All other systems reviewed and are negative.   Blood pressure 117/77, pulse 79, temperature 98.3 F (36.8 C), temperature source Oral, resp. rate 16, height 5\' 5"  (1.651 m), weight 77.6 kg (171 lb), SpO2 98 %.Body mass index is 28.46 kg/m.  General Appearance: Fairly Groomed  Eye Contact:  Fair  Speech:  Clear and Coherent and Normal Rate  Volume:  Decreased  Mood:  Depressed  Affect:  Blunt and Depressed  Thought Process:  Coherent  Orientation:  Full (Time, Place, and Person)  Thought Content:  Logical  Suicidal Thoughts:  No  Homicidal Thoughts:  No  Memory:  Immediate;   Fair Recent;   Fair Remote;   Fair  Judgement:  Fair  Insight:  Present  Psychomotor Activity:  Decreased  Concentration:  Concentration: Fair and Attention Span: Fair  Recall:  [employer] of Knowledge:  Fair  Language:  Fair  Akathisia:  No  Handed:    AIMS (if indicated):     Assets:  Housing Social Support  ADL's:  Intact  Cognition:  WNL  Sleep:  Number of Hours: 7.5     Treatment Plan Summary: Daily contact with patient to assess and evaluate symptoms and progress in treatment and Medication management   MDD: Discontinued effexor XR 300 mg and startred immediate release 75 mg TID. Plan to slowly taper her off effexor which she has taken for >20 years. Pt had a positive response to prozac in the past. Will continue prozac 20 mg po q day.  Impulse control disorder (gambling addiction): continue individual psychotherapy.  Anxiety disorder (PTSD and OCD symptoms but does not meet full diagnosis): plan to target symptoms with fluoxetine.   IBS: continue  probiotics  HTN: continue tenormin 50 mg  DM: continue glipizide 2.5 mg  plus supplemental insulin  GERD: continue PPI bid  OSA: will order CPAP  Narcolepsy: h/o narcolepsy diagnosed in 2015.  In the  past tried provigil, nuvigil and adderall + provigil. No recent episodes of cataplexy.  Does not appear overly sedated this morning. We will consider adding an stimulant if significant sedation noted.  Thrombophlebitis: mild inflammation on sites of IV--mill monitor  Diet: low sodium and carb modified  Precautions q 15 m checks  VS q day  Hospitalization IVC  Labs: TSH WNL. Waiting on results of B12  Hildred Priest, MD 07/12/2016, 2:04 PM

## 2016-07-12 NOTE — Plan of Care (Signed)
Problem: Coping: Goal: Ability to verbalize feelings will improve Outcome: Progressing Verbalizes that she just felt hopeless and helpless after loosing her job.

## 2016-07-13 LAB — GLUCOSE, CAPILLARY
Glucose-Capillary: 121 mg/dL — ABNORMAL HIGH (ref 65–99)
Glucose-Capillary: 190 mg/dL — ABNORMAL HIGH (ref 65–99)
Glucose-Capillary: 180 mg/dL — ABNORMAL HIGH (ref 65–99)

## 2016-07-13 MED ORDER — PANTOPRAZOLE SODIUM 40 MG PO TBEC
40.0000 mg | DELAYED_RELEASE_TABLET | Freq: Two times a day (BID) | ORAL | Status: DC
  Administered 2016-07-13 – 2016-07-20 (×14): 40 mg via ORAL
  Filled 2016-07-13 (×15): qty 1

## 2016-07-13 MED ORDER — QUETIAPINE FUMARATE 25 MG PO TABS
50.0000 mg | ORAL_TABLET | Freq: Every day | ORAL | Status: DC
  Administered 2016-07-13: 50 mg via ORAL
  Filled 2016-07-13: qty 2

## 2016-07-13 NOTE — Plan of Care (Signed)
Problem: Safety: Goal: Periods of time without injury will increase Outcome: Progressing Patient has remained without injury during this shift.
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
## 2016-07-13 NOTE — Plan of Care (Signed)
Problem: Physical Regulation: Goal: Will remain free from infection Outcome: Progressing Area to arm improving, no drainage noted. Pt denies pain
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
## 2016-07-13 NOTE — Progress Notes (Signed)
[facility] MD Progress Note  07/13/2016 2:36 PM Christine Cook  MRN:  [id number] Subjective:   40 y/o F admitted on 10/5 after overdosing on alprazolam (50 tabs). Pt with a h/o depression and gambling addiction. Currently dealing with legal charges and lost her job on 10/5.   Today the patient reports she is not doing well. She was unable to sleep last night, She had racing thoughts and lots of anxiety last night preventing her from falling asleep. She started using the CPAP, but took it off at some point when asleep. She state that today she is not suicidal, but she is hopeless and doesn't care if she lives. Pt cannot identify a trigger for the insomnia last night other than the stressors that brought her to the hospital (losing her job and legal problems).  Pt denies SI, Hi or AVH. She denies side effects of the medications or any effects of lowering the Effexor dose.   Per nursing staff: D: Patient appears sad and depressed. Rates her hopelessness at an 8. Denies SI/HI/AVH at this time. Interacts well with peers. Attended group. C/o throat pain.  A: Medication given with education. Encouragement provided. PRN Tylenol and Cepacol given. R: Compliant with medication. She has remained calm and cooperative. Safety maintained with 15 min checks.    Principal Problem: MDD (major depressive disorder) Diagnosis:   Patient Active Problem List   Diagnosis Date Noted  . OSA (obstructive sleep apnea) [G47.33] 07/12/2016  . 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 Vitamin B12 deficiency [E53.8] 07/12/2016  . Vitamin D deficiency [E55.9] 07/12/2016  . Diabetes ([place]) [E11.9] 07/11/2016  . MDD (major depressive disorder) [F32.9] 07/11/2016  . Impulse control disorder (gambling) [F63.9] 07/11/2016  . Hypertension [I10] 07/09/2016  . Benzodiazepine overdose [T42.4X1A] 07/09/2016  . GERD [K21.9] 01/21/2009   Total Time spent with patient: 30 minutes  Past Psychiatric History:   Past Medical History:  Past  Medical History:  Diagnosis Date  . Anxiety   . Depression   . Diabetes ([place])   . Fatty liver disease, nonalcoholic   . Heart murmur   . Heart palpitations   . High cholesterol   . 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 Hypertension   . Irritable bowel syndrome with diarrhea   . 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 Migraine   . 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 Sleep apnea with hypersomnolence    CPAP study 11-28-09,  10-02-2009 AHI was 16.5, pressure to   . Tachycardia     Past Surgical History:  Procedure Laterality Date  . ADENOIDECTOMY    . BLADDER SURGERY    . CHOLECYSTECTOMY    . KNEE SURGERY     Left knee x 2   . TONSILLECTOMY     Family History:  Family History  Problem Relation Age of Onset  . Asthma Mother   . Hypertension Father   . Coronary artery disease Father   . Diabetes Father   . Asthma Daughter   . Colon cancer Neg Hx    Family Psychiatric  History:   Social History:  History  Alcohol Use No     History  Drug Use No    Social History   Social History  . Marital status: Single    Spouse name: N/A  . Number of children: 1  . Years of education: College   Occupational History  . Support specialist   .  [employer]  History Main Topics  . Smoking status: Former Smoker    Packs/day: 0.50    Years: 2.00    Types: Cigarettes    Quit date: 02/14/1995  . Smokeless tobacco: Never Used  . Alcohol use No  . Drug use: No  . Sexual activity: Not Asked   Other Topics Concern  . None   Social History Narrative   Patient is single and lives alone.   Patient works at [employer].   Patient has a college education.   Patient has one child.   Patient is right-handed.   Patient drinks two 20 oz of Coke daily.   Additional Social History:    Sleep: Poor  Appetite:  Good  Current Medications: Current Facility-Administered Medications  Medication Dose Route Frequency Provider Last Rate Last Dose  .  acetaminophen (TYLENOL) tablet 650 mg  650 mg Oral Q6H PRN Hildred Priest, MD   650 mg at 07/12/16 2129  . acidophilus (RISAQUAD) capsule 1 capsule  1 capsule Oral Daily Hildred Priest, MD   1 capsule at 07/13/16 0826  . alum & mag hydroxide-simeth (MAALOX/MYLANTA) 200-200-20 MG/5ML suspension 30 mL  30 mL Oral Q4H PRN Hildred Priest, MD      . atenolol (TENORMIN) tablet 50 mg  50 mg Oral QHS Hildred Priest, MD   50 mg at 07/12/16 2129  . cholecalciferol (VITAMIN D) tablet 400 Units  400 Units Oral Daily Hildred Priest, MD   400 Units at 07/13/16 [phone]  . cyanocobalamin ((VITAMIN B-12)) injection 1,000 mcg  1,000 mcg Intramuscular Daily Hildred Priest, MD   1,000 mcg at 07/13/16 0826  . FLUoxetine (PROZAC) capsule 20 mg  20 mg Oral Daily Hildred Priest, MD   20 mg at 07/13/16 0826  . glipiZIDE (GLUCOTROL XL) 24 hr tablet 2.5 mg  2.5 mg Oral Q breakfast Hildred Priest, MD   2.5 mg at 07/13/16 0826  . insulin aspart (novoLOG) injection 0-5 Units  0-5 Units Subcutaneous QHS Hildred Priest, MD      . insulin aspart (novoLOG) injection 0-9 Units  0-9 Units Subcutaneous TID WC Hildred Priest, MD   2 Units at 07/13/16 1224  . magnesium hydroxide (MILK OF MAGNESIA) suspension 30 mL  30 mL Oral Daily PRN Hildred Priest, MD      . menthol-cetylpyridinium (CEPACOL) lozenge 3 mg  1 lozenge Oral PRN Hildred Priest, MD   3 mg at 07/13/16 0645  . nicotine (NICODERM CQ - dosed in mg/24 hours) patch 21 mg  21 mg Transdermal Daily Hildred Priest, MD   21 mg at 07/13/16 0826  . pantoprazole (PROTONIX) EC tablet 40 mg  40 mg Oral Daily Hildred Priest, MD   40 mg at 07/13/16 0826  . QUEtiapine (SEROQUEL) tablet 50 mg  50 mg Oral QHS Hildred Priest, MD      . venlafaxine [facility]) tablet 75 mg  75 mg Oral TID WC Hildred Priest, MD   75 mg at 07/13/16  1223    Lab Results:  Results for orders placed or performed during the hospital encounter of 07/11/16 (from the past 48 hour(s))  Glucose, capillary     Status: None   Collection Time: 07/11/16 10:38 PM  Result Value Ref Range   Glucose-Capillary 89 65 - 99 mg/dL  TSH     Status: None   Collection Time: 07/12/16  6:40 AM  Result Value Ref Range   TSH 3.278 0.350 - 4.500 uIU/mL    Comment: Performed by a 3rd Generation assay with a  functional sensitivity of <=0.01 uIU/mL.  Glucose, capillary     Status: Abnormal   Collection Time: 07/12/16  6:40 AM  Result Value Ref Range   Glucose-Capillary 149 (H) 65 - 99 mg/dL  Vitamin B12     Status: Abnormal   Collection Time: 07/12/16  6:43 AM  Result Value Ref Range   Vitamin B-12 137 (L) 180 - 914 pg/mL    Comment: (NOTE) This assay is not validated for testing neonatal or myeloproliferative syndrome specimens for Vitamin B12 levels. Performed at [facility]   Glucose, capillary     Status: Abnormal   Collection Time: 07/12/16 12:06 PM  Result Value Ref Range   Glucose-Capillary 109 (H) 65 - 99 mg/dL   Comment 1 Notify RN   Glucose, capillary     Status: Abnormal   Collection Time: 07/12/16  4:32 PM  Result Value Ref Range   Glucose-Capillary 166 (H) 65 - 99 mg/dL  Glucose, capillary     Status: Abnormal   Collection Time: 07/12/16  8:19 PM  Result Value Ref Range   Glucose-Capillary 116 (H) 65 - 99 mg/dL  Glucose, capillary     Status: Abnormal   Collection Time: 07/13/16  6:42 AM  Result Value Ref Range   Glucose-Capillary 121 (H) 65 - 99 mg/dL  Glucose, capillary     Status: Abnormal   Collection Time: 07/13/16 11:21 AM  Result Value Ref Range   Glucose-Capillary 190 (H) 65 - 99 mg/dL   Comment 1 Notify RN     Blood Alcohol level:  No results found for: [facility]  Metabolic Disorder Labs: Lab Results  Component Value Date   HGBA1C 8.3 (H) 07/09/2016   MPG 192 07/09/2016   MPG 143 12/04/2008   No results found  for: PROLACTIN No results found for: CHOL, TRIG, HDL, CHOLHDL, VLDL, LDLCALC  Physical Findings: AIMS:  , ,  ,  ,    CIWA:    COWS:     Musculoskeletal: Strength & Muscle Tone: within normal limits Gait & Station: normal Patient leans: N/A  Psychiatric Specialty Exam: Physical Exam  Constitutional: She is oriented to person, place, and time. She appears well-developed and well-nourished.  HENT:  Head: Normocephalic and atraumatic.  Eyes: EOM are normal.  Neck: Normal range of motion.  Respiratory: Effort normal.  Musculoskeletal: Normal range of motion.  Neurological: She is alert and oriented to person, place, and time.    Review of Systems  Constitutional: Negative.   HENT: Negative.   Eyes: Negative.   Respiratory: Negative.   Cardiovascular: Negative.   Gastrointestinal: Negative.   Genitourinary: Negative.   Musculoskeletal: Negative.   Skin: Negative.   Neurological: Negative.   Endo/Heme/Allergies: Negative.   Psychiatric/Behavioral: Positive for depression and suicidal ideas. Negative for hallucinations and substance abuse. The patient is nervous/anxious and has insomnia.   All other systems reviewed and are negative.   Blood pressure 125/75, pulse 76, temperature 98.5 F (36.9 C), temperature source Oral, resp. rate 18, height 5\' 5"  (1.651 m), weight 77.6 kg (171 lb), SpO2 98 %.Body mass index is 28.46 kg/m.  General Appearance: Fairly Groomed  Eye Contact:  Fair  Speech:  Clear and Coherent and Normal Rate  Volume:  Decreased  Mood:  Depressed and Hopeless  Affect:  Blunt and Depressed  Thought Process:  Coherent  Orientation:  Full (Time, Place, and Person)  Thought Content:  Logical  Suicidal Thoughts:  No  Homicidal Thoughts:  No  Memory:  Immediate;  Fair Recent;   Fair Remote;   Fair  Judgement:  Fair  Insight:  Fair  Psychomotor Activity:  Decreased  Concentration:  Concentration: Fair and Attention Span: Fair  Recall:  [employer] of  Knowledge:  Fair  Language:  Fair  Akathisia:  No  Handed:    AIMS (if indicated):     Assets:  Housing Social Support  ADL's:  Intact  Cognition:  WNL  Sleep:  Number of Hours: 6.75     Treatment Plan Summary: Daily contact with patient to assess and evaluate symptoms and progress in treatment and Medication management   MDD: Discontinued effexor XR 300 mg and startred immediate release 75 mg TID. Plan to slowly taper her off effexor which she has taken for >20 years. Pt had a positive response to prozac in the past. Will continue prozac 20 mg po q day.  Impulse control disorder (gambling addiction): continue individual psychotherapy.  Anxiety disorder (PTSD and OCD symptoms but does not meet full diagnosis): plan to target symptoms with fluoxetine.   Insomnia: Will start Seroquel 50 mg qHS to help with sleep.   IBS: continue probiotics  HTN: continue tenormin 50 mg  DM: continue glipizide 2.5 mg plus supplemental insulin  GERD: continue PPI bid  OSA: Continue to encourage pt to use CPAP.   Narcolepsy: h/o narcolepsy diagnosed in 2015.  In the past tried provigil, nuvigil and adderall + provigil. No recent episodes of cataplexy.  Does not appear overly sedated this morning. We will consider adding an stimulant if significant sedation noted.  Thrombophlebitis: mild inflammation on sites of IV--will monitor  Vitamin B12 deficiency:  Continue on B12 1000 mg injections qd for about 7 days  Vitamin D deficiency: continue patient on 400 units daily.   Diet: low sodium and carb modified  Precautions q 15 m checks  VS q day  Hospitalization IVC  Labs: TSH WNL, B12 low.  Hildred Priest, MD 07/13/2016, 2:36 PM

## 2016-07-13 NOTE — Care Management (Signed)
I have spoke with the patient on more than one occasion. While the patient doesn't seem to be in the best shape emotionally or mentally, I have seen improvements in her countenance and response. I sought her out on more than one occasion today, but have not been able to have a focused conversation. I will continue to seek her out and hopefully have a fruitful discussion. She doesn't seemed opposed to it, as much as not engaged while conversing. I think there are some other things she's worried about when she leaves and I would like to consider those things.

## 2016-07-13 NOTE — Progress Notes (Signed)
Recreation Therapy Notes  Date: 10.10.17 Time: 3:00 pm Location: Craft Room  Group Topic: Goal Setting  Goal Area(s) Addresses:  Patient will write at least one goal. Patient will write at least one obstacle.  Behavioral Response: Did not attend  Intervention: Recovery Goal Chart  Activity: Patients were instructed to make Recovery Goal Charts including their goals, obstacles, the date they started working on their goals, and the date they achieved their goals.  Education: LRT educated patients on healthy ways they can celebrate reaching their goals.  Education Outcome: Patient did not attend group.  Clinical Observations/Feedback: Patient did not attend group.  Leonette Monarch, LRT/CTRS 07/13/2016 4:27 PM

## 2016-07-13 NOTE — [facility] Group Notes (Addendum)
[facility] [facility] LCSW Group Therapy   07/13/2016 9:30am  Type of Therapy: Group Therapy   Participation Level: Active   Participation Quality: Attentive, Sharing and Supportive   Affect: Appropriate  Cognitive: Alert and Oriented   Insight: Developing/Improving and Engaged   Engagement in Therapy: Developing/Improving and Engaged   Modes of Intervention: Clarification, Confrontation, Discussion, Education, Exploration,  Limit-setting, Orientation, Problem-solving, Rapport Building, Art therapist, Socialization and Support  Summary of Progress/Problems: The topic for group therapy was feelings about diagnosis. Pt actively participated in group discussion on their past and current diagnosis and how they feel towards this. Pt also identified how society and family members judge them, based on their diagnosis as well as stereotypes and stigmas. Pt shared the pt's diagnoses is Major depression, PTSD, anxiety OCD and SI.  Pt shared the pt feels ashamed and embarrased.  Pt shares the pt feels the pt's family does notunderstand and that this is difficult for the pt. Pt shared the pt feels that while there is more understanding by others the pt still feels somewhat stigmatized and in turn the pt feels inadequate, as a result. ,Pt was polite and cooperative with the CSW and other group members and focused and attentive to the topics discussed and the sharing of others.    Alphonse Guild. Cresta Riden, LCSWA, LCAS

## 2016-07-13 NOTE — Plan of Care (Deleted)
Problem: [facility] Participation in Recreation Therapeutic Interventions Goal: STG-Patient will demonstrate improved self esteem by identif STG: Self-Esteem - Within 4 treatment sessions, patient will verbalize at least 5 positive affirmation statements in each of 2 treatment sessions to increase self-esteem post d/c.  Outcome: Progressing Treatment Session 1; Completed 1 out of 2: At approximately 1:20 pm, LRT met with patient in patient room. Patient verbalized 5 positive affirmation statements. Patient reported it felt "a little rehearsed". LRT encouraged patient to continue saying positive affirmation statements.   M , LRT/CTRS 10.10.17 1:59 pm Goal: STG-Patient will identify at least five coping skills for ** STG: Coping Skills - Within 4 treatment sessions, patient will verbalize at least 5 new coping skills in each of 2 treatment sessions to increase healthy coping skills post d/c.  Outcome: Progressing Treatment Session 1; Completed 1 out of 2: At approximately 1:30 pm, LRT met with patient in patient room. LRT educated and provided patient with handouts on stress management techniques. Patient verbalized understanding. LRT encouraged patient to read over and practice the stress management techniques.   M , LRT/CTRS 10.10.17 2:01 pm   

## 2016-07-13 NOTE — [facility] Group Notes (Signed)
Goals Group  Date/Time: 07/13/2016 9am  Type of Therapy and Topic: Group Therapy: Goals Group: SMART Goals   Pt was called, but did not attend   Alphonse Guild. Iban Utz, LCSWA, LCAS

## 2016-07-13 NOTE — [facility] Group Notes (Signed)
[facility] Group Notes:  (Nursing/MHT/Case Management/Adjunct)  Date:  07/13/2016  Time:  2:33 PM  Type of Therapy:  Psychoeducational Skills  Participation Level:  Did Not Attend  Charise Killian 07/13/2016, 2:33 PM

## 2016-07-13 NOTE — Plan of Care (Signed)
Problem: [facility] Participation in Recreation Therapeutic Interventions Goal: STG-Patient will demonstrate improved self esteem by identif STG: Self-Esteem - Within 4 treatment sessions, patient will verbalize at least 5 positive affirmation statements in each of 2 treatment sessions to increase self-esteem post d/c.  Outcome: Progressing Treatment Session 1; Completed 1 out of 2: At approximately 1:20 pm, LRT met with patient in patient room. Patient verbalized 5 positive affirmation statements. Patient reported it felt "a little rehearsed". LRT encouraged patient to continue saying positive affirmation statements.  Leonette Monarch, LRT/CTRS 10.10.17 2:26 pm Goal: STG-Patient will identify at least five coping skills for ** STG: Coping Skills - Within 4 treatment sessions, patient will verbalize at least 5 new coping skills in each of 2 treatment sessions to increase healthy coping skills post d/c.  Outcome: Progressing Treatment Session 1; Completed 1 out of 2: At approximately 1:20 pm, LRT met with patient in patient room. Patient verbalized 5 coping skills. LRT encouraged patient to continue thinking of healthy coping skills.  Leonette Monarch, LRT/CTRS 10.10.17 2:27 pm

## 2016-07-13 NOTE — Progress Notes (Signed)
D: Patient appears sad and depressed. Rates her hopelessness at an 8. Denies SI/HI/AVH at this time. Interacts well with peers. Attended group. C/o throat pain.  A: Medication given with education. Encouragement provided. PRN Tylenol and Cepacol given. R: Compliant with medication. She has remained calm and cooperative. Safety maintained with 15 min checks.

## 2016-07-13 NOTE — Progress Notes (Signed)
Pt placed on [facility] C-3 CPAP for sleep. CPAP plugged into red outlet. RN aware of pt wearing CPAP

## 2016-07-14 LAB — GLUCOSE, CAPILLARY
Glucose-Capillary: 135 mg/dL — ABNORMAL HIGH (ref 65–99)
Glucose-Capillary: 146 mg/dL — ABNORMAL HIGH (ref 65–99)
Glucose-Capillary: 171 mg/dL — ABNORMAL HIGH (ref 65–99)
Glucose-Capillary: 161 mg/dL — ABNORMAL HIGH (ref 65–99)
Glucose-Capillary: 120 mg/dL — ABNORMAL HIGH (ref 65–99)

## 2016-07-14 MED ORDER — QUETIAPINE FUMARATE 25 MG PO TABS
75.0000 mg | ORAL_TABLET | Freq: Every day | ORAL | Status: DC
  Administered 2016-07-14: 75 mg via ORAL
  Filled 2016-07-14: qty 3

## 2016-07-14 NOTE — [facility] Group Notes (Signed)
[place] Group Notes:  (Nursing/MHT/Case Management/Adjunct)  Date:  07/14/2016  Time:  5:44 PM  Type of Therapy:  Psychoeducational Skills  Participation Level:  Active  Participation Quality:  Appropriate  Affect:  Appropriate  Cognitive:  Appropriate  Insight:  Appropriate  Engagement in Group:  Engaged and Supportive  Modes of Intervention:  Discussion, Education and Support  Summary of Progress/Problems:  Primitivo Gauze 07/14/2016, 5:44 PM

## 2016-07-14 NOTE — Plan of Care (Signed)
Problem: Education: Goal: Knowledge of [facility] Education information/materials will improve Outcome: Progressing Cooperative with current plan of care.

## 2016-07-14 NOTE — Progress Notes (Signed)
Pt placed on [facility] CPAP C-3. CPAP plugged into red outlet

## 2016-07-14 NOTE — Tx Team (Signed)
Interdisciplinary Treatment and Diagnostic Plan Update  07/14/2016 Time of Session: 10:30am Christine Cook MRN: [id number]  Principal Diagnosis: MDD (major depressive disorder)  Secondary Diagnoses: Principal Problem:   MDD (major depressive disorder) Active Problems:   GERD   Hypertension   Benzodiazepine overdose   Diabetes ([place])   Impulse control disorder (gambling)   OSA (obstructive sleep apnea)   Narcolepsy   Vitamin B12 deficiency   Vitamin D deficiency   Current Medications:  Current Facility-Administered Medications  Medication Dose Route Frequency Provider Last Rate Last Dose  . acetaminophen (TYLENOL) tablet 650 mg  650 mg Oral Q6H PRN Hildred Priest, MD   650 mg at 07/14/16 1542  . acidophilus (RISAQUAD) capsule 1 capsule  1 capsule Oral Daily Hildred Priest, MD   1 capsule at 07/14/16 0837  . alum & mag hydroxide-simeth (MAALOX/MYLANTA) 200-200-20 MG/5ML suspension 30 mL  30 mL Oral Q4H PRN Hildred Priest, MD   30 mL at 07/13/16 1639  . atenolol (TENORMIN) tablet 50 mg  50 mg Oral QHS Hildred Priest, MD   50 mg at 07/13/16 2139  . cholecalciferol (VITAMIN D) tablet 400 Units  400 Units Oral Daily Hildred Priest, MD   400 Units at 07/14/16 0837  . cyanocobalamin ((VITAMIN B-12)) injection 1,000 mcg  1,000 mcg Intramuscular Daily Hildred Priest, MD   1,000 mcg at 07/14/16 0840  . FLUoxetine (PROZAC) capsule 20 mg  20 mg Oral Daily Hildred Priest, MD   20 mg at 07/14/16 0837  . glipiZIDE (GLUCOTROL XL) 24 hr tablet 2.5 mg  2.5 mg Oral Q breakfast Hildred Priest, MD   2.5 mg at 07/14/16 0837  . insulin aspart (novoLOG) injection 0-5 Units  0-5 Units Subcutaneous QHS Hildred Priest, MD      . insulin aspart (novoLOG) injection 0-9 Units  0-9 Units Subcutaneous TID WC Hildred Priest, MD   2 Units at 07/14/16 1210  . magnesium hydroxide (MILK OF MAGNESIA) suspension  30 mL  30 mL Oral Daily PRN Hildred Priest, MD      . menthol-cetylpyridinium (CEPACOL) lozenge 3 mg  1 lozenge Oral PRN Hildred Priest, MD   3 mg at 07/13/16 0645  . nicotine (NICODERM CQ - dosed in mg/24 hours) patch 21 mg  21 mg Transdermal Daily Hildred Priest, MD   21 mg at 07/14/16 [id number]  . pantoprazole (PROTONIX) EC tablet 40 mg  40 mg Oral BID Rainey Pines, MD   40 mg at 07/14/16 1706  . QUEtiapine (SEROQUEL) tablet 75 mg  75 mg Oral QHS Hildred Priest, MD      . venlafaxine [employer]) tablet 75 mg  75 mg Oral TID WC Hildred Priest, MD   75 mg at 07/14/16 1706   PTA Medications: Prescriptions Prior to Admission  Medication Sig Dispense Refill Last Dose  . amphetamine-dextroamphetamine (ADDERALL) 10 MG tablet Take 1 tablet (10 mg total) by mouth every morning. 60 tablet 0   . ARIPiprazole (ABILIFY) 5 MG tablet Take 5 mg by mouth daily.   Taking  . Armodafinil 250 MG tablet Take 1 tablet (250 mg total) by mouth every morning. 30 tablet 5   . atenolol (TENORMIN) 50 MG tablet Take 50 mg by mouth at bedtime.   Taking  . diphenoxylate-atropine (LOMOTIL) 2.5-0.025 MG per tablet TAKE ONE TABLET BY MOUTH TWICE DAILY 60 tablet 1   . glipiZIDE (GLUCOTROL XL) 2.5 MG 24 hr tablet Take 1 tablet (2.5 mg total) by mouth daily with breakfast. 30 tablet 0   .  glycopyrrolate (ROBINUL) 2 MG tablet Take 1 tablet (2 mg total) by mouth 2 (two) times daily. 60 tablet 3 Taking  . loperamide (IMODIUM) 2 MG capsule Take 1 capsule (2 mg total) by mouth every 6 (six) hours as needed for diarrhea or loose stools. 30 capsule 0   . ondansetron (ZOFRAN) 4 MG tablet Take 1 tab every 6 hours as needed for cramping, spasms diarrhea. 40 tablet 2 Taking  . pantoprazole (PROTONIX) 40 MG tablet Take 40 mg by mouth 2 (two) times daily.   01/16/2016 at Unknown time  . Probiotic Product (ALIGN) 4 MG CAPS Take 1 capsule by mouth daily.   Taking  . Sodium Oxybate 500 MG/ML SOLN  Starter dose of XYREM, 2.25 gram twice at night for 7 days, than 3 gram 7 days , two doses at night. Than 4.5 gram final dose , twice nightly. 270 mL 3   . venlafaxine XR (EFFEXOR-XR) 150 MG 24 hr capsule Take 300 mg by mouth daily with breakfast.   01/16/2016 at Unknown time    Patient Stressors: Financial difficulties Legal issue  Patient Strengths: Curator fund of knowledge Supportive family/friends  Treatment Modalities: Medication Management, Group therapy, Case management,  1 to 1 session with clinician, Psychoeducation, Recreational therapy.   Physician Treatment Plan for Primary Diagnosis: MDD (major depressive disorder) Long Term Goal(s): Improvement in symptoms so as ready for discharge Improvement in symptoms so as ready for discharge   Short Term Goals: Ability to identify changes in lifestyle to reduce recurrence of condition will improve Ability to disclose and discuss suicidal ideas Ability to demonstrate self-control will improve Ability to identify and develop effective coping behaviors will improve Compliance with prescribed medications will improve Ability to identify triggers associated with substance abuse/mental health issues will improve Ability to identify changes in lifestyle to reduce recurrence of condition will improve Ability to maintain clinical measurements within normal limits will improve Ability to identify triggers associated with substance abuse/mental health issues will improve  Medication Management: Evaluate patient's response, side effects, and tolerance of medication regimen.  Therapeutic Interventions: 1 to 1 sessions, Unit Group sessions and Medication administration.  Evaluation of Outcomes: Progressing  Physician Treatment Plan for Secondary Diagnosis: Principal Problem:   MDD (major depressive disorder) Active Problems:   GERD   Hypertension   Benzodiazepine overdose   Diabetes ([place])   Impulse control disorder  (gambling)   OSA (obstructive sleep apnea)   Narcolepsy   Vitamin B12 deficiency   Vitamin D deficiency  Long Term Goal(s): Improvement in symptoms so as ready for discharge Improvement in symptoms so as ready for discharge   Short Term Goals: Ability to identify changes in lifestyle to reduce recurrence of condition will improve Ability to disclose and discuss suicidal ideas Ability to demonstrate self-control will improve Ability to identify and develop effective coping behaviors will improve Compliance with prescribed medications will improve Ability to identify triggers associated with substance abuse/mental health issues will improve Ability to identify changes in lifestyle to reduce recurrence of condition will improve Ability to maintain clinical measurements within normal limits will improve Ability to identify triggers associated with substance abuse/mental health issues will improve     Medication Management: Evaluate patient's response, side effects, and tolerance of medication regimen.  Therapeutic Interventions: 1 to 1 sessions, Unit Group sessions and Medication administration.  Evaluation of Outcomes: Progressing   RN Treatment Plan for Primary Diagnosis: MDD (major depressive disorder) Long Term Goal(s): Knowledge of disease and therapeutic regimen  to maintain health will improve  Short Term Goals: Ability to verbalize frustration and anger appropriately will improve, Ability to participate in decision making will improve, Ability to verbalize feelings will improve, Ability to identify and develop effective coping behaviors will improve and Compliance with prescribed medications will improve  Medication Management: RN will administer medications as ordered by provider, will assess and evaluate patient's response and provide education to patient for prescribed medication. RN will report any adverse and/or side effects to prescribing provider.  Therapeutic  Interventions: 1 on 1 counseling sessions, Psychoeducation, Medication administration, Evaluate responses to treatment, Monitor vital signs and CBGs as ordered, Perform/monitor CIWA, COWS, AIMS and Fall Risk screenings as ordered, Perform wound care treatments as ordered.  Evaluation of Outcomes: Progressing   LCSW Treatment Plan for Primary Diagnosis: MDD (major depressive disorder) Long Term Goal(s): Safe transition to appropriate next level of care at discharge, Engage patient in therapeutic group addressing interpersonal concerns.  Short Term Goals: Engage patient in aftercare planning with referrals and resources, Increase ability to appropriately verbalize feelings, Increase emotional regulation, Identify triggers associated with mental health/substance abuse issues and Increase skills for wellness and recovery  Therapeutic Interventions: Assess for all discharge needs, 1 to 1 time with Social worker, Explore available resources and support systems, Assess for adequacy in community support network, Educate family and significant other(s) on suicide prevention, Complete Psychosocial Assessment, Interpersonal group therapy.  Evaluation of Outcomes: Progressing   Progress in Treatment: Attending groups: Yes. Participating in groups: Yes. Taking medication as prescribed: Yes. Toleration medication: Yes. Family/Significant other contact made: No, will contact:  mother Patient understands diagnosis: Yes. Discussing patient identified problems/goals with staff: Yes. Medical problems stabilized or resolved: Yes. Denies suicidal/homicidal ideation: No.  Issues/concerns per patient self-inventory: No. Other:    New problem(s) identified: No, Describe:     New Short Term/Long Term Goal(s):  Discharge Plan or Barriers:   Reason for Continuation of Hospitalization: Depression Medication stabilization Suicidal ideation  Estimated Length of Stay:5 days  Attendees: Patient:Christine Cook 07/14/2016 5:21 PM  Physician: Merlyn Albert. MD 07/14/2016 5:21 PM  Nursing: Polly Cobia, RN 07/14/2016 5:21 PM  RN Care Manager: 07/14/2016 5:21 PM  Social Worker: Dossie Arbour, LCSW 07/14/2016 5:21 PM  Recreational Therapist: Everitt Amber, [place] 07/14/2016 5:21 PM  Other:  07/14/2016 5:21 PM  Other:  07/14/2016 5:21 PM  Other: 07/14/2016 5:21 PM    Scribe for Treatment Team: August Saucer, LCSW 07/14/2016 5:21 PM

## 2016-07-14 NOTE — Progress Notes (Signed)
Patient family into visit. Patient requests to sent her dirty clothes home for family to [place]. Safety maintained.

## 2016-07-14 NOTE — Plan of Care (Signed)
Problem: [facility] Participation in Recreation Therapeutic Interventions Goal: STG-Patient will demonstrate improved self esteem by identif STG: Self-Esteem - Within 4 treatment sessions, patient will verbalize at least 5 positive affirmation statements in each of 2 treatment sessions to increase self-esteem post d/c.  Outcome: Completed/Met Date Met: 07/14/16 Treatment Session 2; Completed 2 out of 2: At approximately 11:50 am, LRT met with patient in patient room. Patient verbalized 5 positive affirmation statements. Patient reported, "It wasn't as hard as it was yesterday" and "it's almost believable". LRT encouraged patient to continue saying positive affirmation.  Leonette Monarch, LRT/CTRS 10.11.17 2:19 pm Goal: STG-Patient will identify at least five coping skills for ** STG: Coping Skills - Within 4 treatment sessions, patient will verbalize at least 5 new coping skills in each of 2 treatment sessions to increase healthy coping skills post d/c.  Outcome: Completed/Met Date Met: 07/14/16 Treatment Session 2; Completed 2 out of 2: At approximately 11:50 am, LRT met with patient in patient room Patient verbalize 25 coping skills. LRT encouraged patient to continue thinking of healthy coping skills.  Leonette Monarch, LRT/CTRS 10.11.17 2:20 pm

## 2016-07-14 NOTE — Progress Notes (Signed)
Recreation Therapy Notes  Date: 10.11.17 Time: 1:00 pm Location: Craft Room  Group Topic: Self-esteem  Goal Area(s) Addresses:  Patient will write at least one positive trait about self. Patient will verbalize why it is important to have a healthy self-esteem.  Behavioral Response: Did not attend  Intervention: I Am  Activity: Patients were given a worksheet with the letter I on it and instructed to write as many positive traits inside the letter.   Education: LRT educated patients on ways they can increase their self-esteem.  Education Outcome: Patient did not attend group.   Clinical Observations/Feedback: Patient did not attend group.  Leonette Monarch, LRT/CTRS 07/14/2016 3:03 PM

## 2016-07-14 NOTE — Progress Notes (Signed)
[facility] MD Progress Note  07/14/2016 12:33 PM Christine Cook  MRN:  [id number] Subjective:   40 y/o F admitted on 10/5 after overdosing on alprazolam (50 tabs). Pt with a h/o depression and gambling addiction. Currently dealing with legal charges and lost her job on 10/5.   Today the patient reports she is still not doing well. She was able to sleep a bit better last night with low dose seroquel. She woke up in the middle of the night and was unable to go back to sleep.  Continues to feels very anxious and says she still feels like she doesn't want to be alive. Still hopeless.  Pt denies SI, Hi or AVH. She denies side effects of the medications or any effects of lowering the Effexor dose.   Per nursing staff: Patient with depressed affect, cooperative behavior with meals, meds and plan of care. No SI/HI/AVH at this time. Patient encouraged to attend therapy groups to learn improved coping skills to manage stressors and depression. Minimal interaction with peers. Cooperative with initial interaction and assessment. Patient verbalizes needs appropriately with staff. Initiates interaction with Probation officer. Safety maintained.   Principal Problem: MDD (major depressive disorder) Diagnosis:   Patient Active Problem List   Diagnosis Date Noted  . OSA (obstructive sleep apnea) [G47.33] 07/12/2016  . 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 Vitamin B12 deficiency [E53.8] 07/12/2016  . Vitamin D deficiency [E55.9] 07/12/2016  . Diabetes ([place]) [E11.9] 07/11/2016  . MDD (major depressive disorder) [F32.9] 07/11/2016  . Impulse control disorder (gambling) [F63.9] 07/11/2016  . Hypertension [I10] 07/09/2016  . Benzodiazepine overdose [T42.4X1A] 07/09/2016  . GERD [K21.9] 01/21/2009   Total Time spent with patient: 30 minutes  Past Psychiatric History:   Past Medical History:  Past Medical History:  Diagnosis Date  . Anxiety   . Depression   . Diabetes ([place])   . Fatty liver disease, nonalcoholic   . Heart  murmur   . Heart palpitations   . High cholesterol   . 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 Hypertension   . Irritable bowel syndrome with diarrhea   . 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 Migraine   . 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 Sleep apnea with hypersomnolence    CPAP study 11-28-09,  10-02-2009 AHI was 16.5, pressure to   . Tachycardia     Past Surgical History:  Procedure Laterality Date  . ADENOIDECTOMY    . BLADDER SURGERY    . CHOLECYSTECTOMY    . KNEE SURGERY     Left knee x 2   . TONSILLECTOMY     Family History:  Family History  Problem Relation Age of Onset  . Asthma Mother   . Hypertension Father   . Coronary artery disease Father   . Diabetes Father   . Asthma Daughter   . Colon cancer Neg Hx    Family Psychiatric  History:   Social History:  History  Alcohol Use No     History  Drug Use No    Social History   Social History  . Marital status: Single    Spouse name: N/A  . Number of children: 1  . Years of education: College   Occupational History  . Support specialist   .  [employer]   Social History Main Topics  . Smoking status: Former Smoker    Packs/day: 0.50    Years: 2.00    Types: Cigarettes    Quit  date: 02/14/1995  . Smokeless tobacco: Never Used  . Alcohol use No  . Drug use: No  . Sexual activity: Not Asked   Other Topics Concern  . None   Social History Narrative   Patient is single and lives alone.   Patient works at [employer].   Patient has a college education.   Patient has one child.   Patient is right-handed.   Patient drinks two 20 oz of Coke daily.   Additional Social History:    Sleep: Poor  Appetite:  Good  Current Medications: Current Facility-Administered Medications  Medication Dose Route Frequency Provider Last Rate Last Dose  . acetaminophen (TYLENOL) tablet 650 mg  650 mg Oral Q6H PRN Hildred Priest, MD   650 mg at 07/12/16 2129  . acidophilus  (RISAQUAD) capsule 1 capsule  1 capsule Oral Daily Hildred Priest, MD   1 capsule at 07/14/16 0837  . alum & mag hydroxide-simeth (MAALOX/MYLANTA) 200-200-20 MG/5ML suspension 30 mL  30 mL Oral Q4H PRN Hildred Priest, MD   30 mL at 07/13/16 1639  . atenolol (TENORMIN) tablet 50 mg  50 mg Oral QHS Hildred Priest, MD   50 mg at 07/13/16 2139  . cholecalciferol (VITAMIN D) tablet 400 Units  400 Units Oral Daily Hildred Priest, MD   400 Units at 07/14/16 0837  . cyanocobalamin ((VITAMIN B-12)) injection 1,000 mcg  1,000 mcg Intramuscular Daily Hildred Priest, MD   1,000 mcg at 07/14/16 0840  . FLUoxetine (PROZAC) capsule 20 mg  20 mg Oral Daily Hildred Priest, MD   20 mg at 07/14/16 0837  . glipiZIDE (GLUCOTROL XL) 24 hr tablet 2.5 mg  2.5 mg Oral Q breakfast Hildred Priest, MD   2.5 mg at 07/14/16 0837  . insulin aspart (novoLOG) injection 0-5 Units  0-5 Units Subcutaneous QHS Hildred Priest, MD      . insulin aspart (novoLOG) injection 0-9 Units  0-9 Units Subcutaneous TID WC Hildred Priest, MD   2 Units at 07/14/16 1210  . magnesium hydroxide (MILK OF MAGNESIA) suspension 30 mL  30 mL Oral Daily PRN Hildred Priest, MD      . menthol-cetylpyridinium (CEPACOL) lozenge 3 mg  1 lozenge Oral PRN Hildred Priest, MD   3 mg at 07/13/16 0645  . nicotine (NICODERM CQ - dosed in mg/24 hours) patch 21 mg  21 mg Transdermal Daily Hildred Priest, MD   21 mg at 07/14/16 [id number]  . pantoprazole (PROTONIX) EC tablet 40 mg  40 mg Oral BID Rainey Pines, MD   40 mg at 07/14/16 0837  . QUEtiapine (SEROQUEL) tablet 75 mg  75 mg Oral QHS Hildred Priest, MD      . venlafaxine [facility]) tablet 75 mg  75 mg Oral TID WC Hildred Priest, MD   75 mg at 07/14/16 [id number]    Lab Results:  Results for orders placed or performed during the hospital encounter of 07/11/16 (from the past 48  hour(s))  Glucose, capillary     Status: Abnormal   Collection Time: 07/12/16  4:32 PM  Result Value Ref Range   Glucose-Capillary 166 (H) 65 - 99 mg/dL  Glucose, capillary     Status: Abnormal   Collection Time: 07/12/16  8:19 PM  Result Value Ref Range   Glucose-Capillary 116 (H) 65 - 99 mg/dL  Glucose, capillary     Status: Abnormal   Collection Time: 07/13/16  6:42 AM  Result Value Ref Range   Glucose-Capillary 121 (H) 65 - 99 mg/dL  Glucose,  capillary     Status: Abnormal   Collection Time: 07/13/16 11:21 AM  Result Value Ref Range   Glucose-Capillary 190 (H) 65 - 99 mg/dL   Comment 1 Notify RN   Glucose, capillary     Status: Abnormal   Collection Time: 07/13/16  4:37 PM  Result Value Ref Range   Glucose-Capillary 180 (H) 65 - 99 mg/dL  Glucose, capillary     Status: Abnormal   Collection Time: 07/13/16  9:38 PM  Result Value Ref Range   Glucose-Capillary 135 (H) 65 - 99 mg/dL  Glucose, capillary     Status: Abnormal   Collection Time: 07/14/16  6:48 AM  Result Value Ref Range   Glucose-Capillary 146 (H) 65 - 99 mg/dL  Glucose, capillary     Status: Abnormal   Collection Time: 07/14/16 12:02 PM  Result Value Ref Range   Glucose-Capillary 171 (H) 65 - 99 mg/dL   Comment 1 Notify RN    Comment 2 Document in Chart     Blood Alcohol level:  No results found for: [facility]  Metabolic Disorder Labs: Lab Results  Component Value Date   HGBA1C 8.3 (H) 07/09/2016   MPG 192 07/09/2016   MPG 143 12/04/2008   No results found for: PROLACTIN No results found for: CHOL, TRIG, HDL, CHOLHDL, VLDL, LDLCALC  Physical Findings: AIMS:  , ,  ,  ,    CIWA:    COWS:     Musculoskeletal: Strength & Muscle Tone: within normal limits Gait & Station: normal Patient leans: N/A  Psychiatric Specialty Exam: Physical Exam  Constitutional: She is oriented to person, place, and time. She appears well-developed and well-nourished.  HENT:  Head: Normocephalic and atraumatic.  Eyes: EOM  are normal.  Neck: Normal range of motion.  Respiratory: Effort normal.  Musculoskeletal: Normal range of motion.  Neurological: She is alert and oriented to person, place, and time.    Review of Systems  Constitutional: Negative.   HENT: Negative.   Eyes: Negative.   Respiratory: Negative.   Cardiovascular: Negative.   Gastrointestinal: Negative.   Genitourinary: Negative.   Musculoskeletal: Negative.   Skin: Negative.   Neurological: Negative.   Endo/Heme/Allergies: Negative.   Psychiatric/Behavioral: Positive for depression and suicidal ideas. Negative for hallucinations and substance abuse. The patient is nervous/anxious and has insomnia.   All other systems reviewed and are negative.   Blood pressure 114/66, pulse 72, temperature 98.1 F (36.7 C), temperature source Oral, resp. rate 18, height 5\' 5"  (1.651 m), weight 77.6 kg (171 lb), SpO2 98 %.Body mass index is 28.46 kg/m.  General Appearance: Fairly Groomed  Eye Contact:  Fair  Speech:  Clear and Coherent and Normal Rate  Volume:  Decreased  Mood:  Depressed and Hopeless  Affect:  Blunt and Depressed  Thought Process:  Coherent  Orientation:  Full (Time, Place, and Person)  Thought Content:  Logical  Suicidal Thoughts:  No  Homicidal Thoughts:  No  Memory:  Immediate;   Fair Recent;   Fair Remote;   Fair  Judgement:  Fair  Insight:  Fair  Psychomotor Activity:  Decreased  Concentration:  Concentration: Fair and Attention Span: Fair  Recall:  [employer] of Knowledge:  Fair  Language:  Fair  Akathisia:  No  Handed:    AIMS (if indicated):     Assets:  Housing Social Support  ADL's:  Intact  Cognition:  WNL  Sleep:  Number of Hours: 6.75     Treatment Plan  Summary: Daily contact with patient to assess and evaluate symptoms and progress in treatment and Medication management   MDD: Discontinued effexor XR 300 mg and startred immediate release 75 mg TID. Plan to slowly taper her off effexor which she has  taken for >20 years. Pt had a positive response to prozac in the past. Will continue prozac 20 mg po q day.  Impulse control disorder (gambling addiction): continue individual psychotherapy.  Anxiety disorder (PTSD and OCD symptoms but does not meet full diagnosis): plan to target symptoms with fluoxetine.   Insomnia: started on seroqule 50 mg on 10/10.  Will increase dose today to 75 mg   IBS: continue probiotics  HTN: continue tenormin 50 mg.  BP and HR well controlled  DM: continue glipizide 2.5 mg plus supplemental insulin  GERD: continue PPI bid  OSA: Continue to encourage pt to use CPAP.   Narcolepsy: h/o narcolepsy diagnosed in 2015.  In the past tried provigil, nuvigil and adderall + provigil. No recent episodes of cataplexy.  Does not appear overly sedated this morning. We will consider adding an stimulant if significant sedation noted.  Thrombophlebitis: resolving  Vitamin B12 deficiency:  Continue on B12 1000 mg injections qd for about 7 days  Vitamin D deficiency: continue patient on 400 units daily.   Diet: low sodium and carb modified  Precautions q 15 m checks  VS q day  Hospitalization IVC  Labs: TSH WNL, B12 low.   Case manager consult: pt in need of new PCP as she no longer can see her prior doctor due to lack of insurance.    SW will refer her to med management clinic for med assistance  [facility]: [facility]  Possible d/c in 5- 7 days  SW will make contact with pt's mother and daughter  Hildred Priest, MD 07/14/2016, 12:33 PM

## 2016-07-14 NOTE — [facility] Group Notes (Signed)
[facility] LCSW Group Therapy   07/14/2016  9:30 am   Type of Therapy: Group Therapy   Participation Level: Active   Participation Quality: Attentive, Sharing and Supportive   Affect: Appropriate  Cognitive: Alert and Oriented   Insight: Developing/Improving and Engaged   Engagement in Therapy: Developing/Improving and Engaged   Modes of Intervention: Clarification, Confrontation, Discussion, Education, Exploration, Limit-setting, Orientation, Problem-solving, Rapport Building, Art therapist, Socialization and Support   Summary of Progress/Problems: The topic for group today was emotional regulation. This group focused on both positive and negative emotion identification and allowed group members to process ways to identify feelings, regulate negative emotions, and find healthy ways to manage internal/external emotions. Group members were asked to reflect on a time when their reaction to an emotion led to a negative outcome and explored how alternative responses using emotion regulation would have benefited them. Group members were also asked to discuss a time when emotion regulation was utilized when a negative emotion was experienced. Pt reported the pt has been experiencing negative emotions related to the difficulty is having co-existing with others who are inpatient.  Pt states the pt utilizes healthy coping mechanisms for regulating the pt's emotions, such as exercise and staying focused on the pt's goals but that the pt is also open to suggestions from the group and the CSW.  Pt described a specific situation, stating that the pt does not enjoy spending time with other patients due to their use of foul language and their talking about the use of substances and that the pt has a tendency to become angry, and instead copes with this by seeking out the help of therapist and social workers at the hospital.  Pt was polite and cooperative with the CSW and other group members and focused and  attentive to the topics discussed and the sharing of others.     Christine Cook. Christine Cook, MSW, LCSWA, LCAS

## 2016-07-14 NOTE — Progress Notes (Signed)
Patient with depressed affect, cooperative behavior with meals, meds and plan of care. No SI/HI/AVH at this time. Patient encouraged to attend therapy groups to learn improved coping skills to manage stressors and depression. Minimal interaction with peers. Cooperative with initial interaction and assessment. Patient verbalizes needs appropriately with staff. Initiates interaction with Probation officer. Safety maintained.

## 2016-07-15 LAB — GLUCOSE, CAPILLARY
Glucose-Capillary: 134 mg/dL — ABNORMAL HIGH (ref 65–99)
Glucose-Capillary: 189 mg/dL — ABNORMAL HIGH (ref 65–99)
Glucose-Capillary: 126 mg/dL — ABNORMAL HIGH (ref 65–99)
Glucose-Capillary: 106 mg/dL — ABNORMAL HIGH (ref 65–99)

## 2016-07-15 MED ORDER — QUETIAPINE FUMARATE 100 MG PO TABS: 100.0000 mg | ORAL_TABLET | Freq: Every day | ORAL | Status: DC

## 2016-07-15 MED ORDER — KETOTIFEN FUMARATE 0.025 % OP SOLN
1.0000 [drp] | Freq: Two times a day (BID) | OPHTHALMIC | Status: AC
  Administered 2016-07-15 – 2016-07-17 (×3): 1 [drp] via OPHTHALMIC
  Filled 2016-07-15: qty 5

## 2016-07-15 MED ORDER — QUETIAPINE FUMARATE 25 MG PO TABS
150.0000 mg | ORAL_TABLET | Freq: Every day | ORAL | Status: DC
  Administered 2016-07-15 – 2016-07-19 (×5): 150 mg via ORAL
  Filled 2016-07-15 (×5): qty 2

## 2016-07-15 NOTE — [facility] Group Notes (Signed)
[facility] Group Notes:  (Nursing/MHT/Case Management/Adjunct)  Date:  07/15/2016  Time:  11:45 PM  Type of Therapy:  Psychoeducational Skills  Participation Level:  Active  Participation Quality:  Appropriate  Affect:  Appropriate  Cognitive:  Alert  Insight:  Good  Engagement in Group:  Engaged  Modes of Intervention:  Activity  Summary of Progress/Problems:  Christine Cook 07/15/2016, 11:45 PM

## 2016-07-15 NOTE — [facility] Group Notes (Signed)
[facility] LCSW Group Therapy   07/15/2016 9:30 am   Type of Therapy: Group Therapy   Participation Level: Active   Participation Quality: Attentive, Sharing and Supportive   Affect: Appropriate   Cognitive: Alert and Oriented   Insight: Developing/Improving and Engaged   Engagement in Therapy: Developing/Improving and Engaged   Modes of Intervention: Clarification, Confrontation, Discussion, Education, Exploration, Limit-setting, Orientation, Problem-solving, Rapport Building, Art therapist, Socialization and Support   Summary of Progress/Problems: The topic for group was balance in life. Today's group focused on defining balance in one's own words, identifying things that can knock one off balance, and exploring healthy ways to maintain balance in life. Group members were asked to provide an example of a time when they felt off balance, describe how they handled that situation, and process healthier ways to regain balance in the future. Group members were asked to share the most important tool for maintaining balance that they learned while at [facility] and how they plan to apply this method after discharge. This patient was able to identify self care measures that will promote her wellness. Walks, rest, medications, to do lists that are manageable, patient will practice self care to ensure her wellness, ie baths, walks, talks and engaging family. Christine Pigue LCSW

## 2016-07-15 NOTE — Progress Notes (Addendum)
Pt is pleasant on approach this shift, spent most evening hours socializing in the day room. Pt remains compliant with medications and group. HS CBG is 120, pt denies any SI/HI/VAH, no concerns voiced, will continue to monitor closely for safety.

## 2016-07-15 NOTE — Progress Notes (Signed)
[facility] MD Progress Note  07/15/2016 5:03 PM Christine Cook  MRN:  [id number] Subjective:  40 y/o F admitted on 10/5 after overdosing on alprazolam (50 tabs). Pt with a h/o depression and gambling addiction. Currently dealing with legal charges and lost her job on 10/5.   Pt reports no improvement in mood from yesterday, although she did state that yesterday was a good day. She continues to feel hopeless and asks herself "why she is here." She denies any plans regarding suicide, but still does not trust herself not to try to OD again. She denies HI or AVH. She continued to have poor sleep last night, and feels tired this morning. She has been attending groups and found them helpful.   Pt reports dry eyes. She uses eye drops at home daily to prevent this, but has not used any since coming to the hospital. She does not wear contacts. Denies eye itching or pain. Her sore throat has resolved and the superficial thrombophlebitis is resoling.   Per nursing staff: Awake, alert, up on unit most of the day interacting appropriately with peers/staff. Pleasant, cooperative. Continues to report passive SI "sometimes." Denies HI/AVH. Reports fair sleep with help from sleep medication, fair appetite, low energy, poor concentration. Rates depression, anxiety and hopelessness 9/10 (low 0-10 high). Goal for the day is to go to groups and talk to daughter. Focused on discharge, but reports she believes she will have issues with "not getting the therapy I need" once discharged. Medication complaint. Safety maintained. Pt did speak to her daughter on the telephone.  Safety maintained with every 15 minute checks. Medications administered as ordered. Support and encouragement provided. Will continue to monitor.   Principal Problem: MDD (major depressive disorder) Diagnosis:   Patient Active Problem List   Diagnosis Date Noted  . OSA (obstructive sleep apnea) [G47.33] 07/12/2016  . 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  Vitamin B12 deficiency [E53.8] 07/12/2016  . Vitamin D deficiency [E55.9] 07/12/2016  . Diabetes ([place]) [E11.9] 07/11/2016  . MDD (major depressive disorder) [F32.9] 07/11/2016  . Impulse control disorder (gambling) [F63.9] 07/11/2016  . Hypertension [I10] 07/09/2016  . Benzodiazepine overdose [T42.4X1A] 07/09/2016  . GERD [K21.9] 01/21/2009   Total Time spent with patient: 30 minutes  Past Psychiatric History:   Past Medical History:  Past Medical History:  Diagnosis Date  . Anxiety   . Depression   . Diabetes ([place])   . Fatty liver disease, nonalcoholic   . Heart murmur   . Heart palpitations   . High cholesterol   . 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 Hypertension   . Irritable bowel syndrome with diarrhea   . 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 Migraine   . 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 Sleep apnea with hypersomnolence    CPAP study 11-28-09,  10-02-2009 AHI was 16.5, pressure to   . Tachycardia     Past Surgical History:  Procedure Laterality Date  . ADENOIDECTOMY    . BLADDER SURGERY    . CHOLECYSTECTOMY    . KNEE SURGERY     Left knee x 2   . TONSILLECTOMY     Family History:  Family History  Problem Relation Age of Onset  . Asthma Mother   . Hypertension Father   . Coronary artery disease Father   . Diabetes Father   . Asthma Daughter   . Colon cancer Neg Hx    Family Psychiatric  History:   Social  History:  History  Alcohol Use No     History  Drug Use No    Social History   Social History  . Marital status: Single    Spouse name: N/A  . Number of children: 1  . Years of education: College   Occupational History  . Support specialist   .  [employer]   Social History Main Topics  . Smoking status: Former Smoker    Packs/day: 0.50    Years: 2.00    Types: Cigarettes    Quit date: 02/14/1995  . Smokeless tobacco: Never Used  . Alcohol use No  . Drug use: No  . Sexual activity: Not Asked    Other Topics Concern  . None   Social History Narrative   Patient is single and lives alone.   Patient works at [employer].   Patient has a college education.   Patient has one child.   Patient is right-handed.   Patient drinks two 20 oz of Coke daily.   Additional Social History:     Sleep: Poor  Appetite:  Good  Current Medications: Current Facility-Administered Medications  Medication Dose Route Frequency Provider Last Rate Last Dose  . acetaminophen (TYLENOL) tablet 650 mg  650 mg Oral Q6H PRN Hildred Priest, MD   650 mg at 07/14/16 1542  . acidophilus (RISAQUAD) capsule 1 capsule  1 capsule Oral Daily Hildred Priest, MD   1 capsule at 07/15/16 0805  . alum & mag hydroxide-simeth (MAALOX/MYLANTA) 200-200-20 MG/5ML suspension 30 mL  30 mL Oral Q4H PRN Hildred Priest, MD   30 mL at 07/13/16 1639  . atenolol (TENORMIN) tablet 50 mg  50 mg Oral QHS Hildred Priest, MD   50 mg at 07/14/16 2153  . cholecalciferol (VITAMIN D) tablet 400 Units  400 Units Oral Daily Hildred Priest, MD   400 Units at 07/15/16 0805  . cyanocobalamin ((VITAMIN B-12)) injection 1,000 mcg  1,000 mcg Intramuscular Daily Hildred Priest, MD   1,000 mcg at 07/15/16 0805  . FLUoxetine (PROZAC) capsule 20 mg  20 mg Oral Daily Hildred Priest, MD   20 mg at 07/15/16 0806  . glipiZIDE (GLUCOTROL XL) 24 hr tablet 2.5 mg  2.5 mg Oral Q breakfast Hildred Priest, MD   2.5 mg at 07/15/16 0805  . insulin aspart (novoLOG) injection 0-5 Units  0-5 Units Subcutaneous QHS Hildred Priest, MD      . insulin aspart (novoLOG) injection 0-9 Units  0-9 Units Subcutaneous TID WC Hildred Priest, MD   1 Units at 07/15/16 1638  . ketotifen (ZADITOR) 0.025 % ophthalmic solution 1 drop  1 drop Both Eyes BID Hildred Priest, MD   1 drop at 07/15/16 1642  . magnesium hydroxide (MILK OF MAGNESIA) suspension 30 mL  30 mL Oral Daily  PRN Hildred Priest, MD      . menthol-cetylpyridinium (CEPACOL) lozenge 3 mg  1 lozenge Oral PRN Hildred Priest, MD   3 mg at 07/13/16 0645  . nicotine (NICODERM CQ - dosed in mg/24 hours) patch 21 mg  21 mg Transdermal Daily Hildred Priest, MD   21 mg at 07/15/16 0810  . pantoprazole (PROTONIX) EC tablet 40 mg  40 mg Oral BID Rainey Pines, MD   40 mg at 07/15/16 1638  . QUEtiapine (SEROQUEL) tablet 150 mg  150 mg Oral QHS Hildred Priest, MD      . venlafaxine [facility]) tablet 75 mg  75 mg Oral TID WC Hildred Priest, MD   75 mg  at 07/15/16 1638    Lab Results:  Results for orders placed or performed during the hospital encounter of 07/11/16 (from the past 48 hour(s))  Glucose, capillary     Status: Abnormal   Collection Time: 07/13/16  9:38 PM  Result Value Ref Range   Glucose-Capillary 135 (H) 65 - 99 mg/dL  Glucose, capillary     Status: Abnormal   Collection Time: 07/14/16  6:48 AM  Result Value Ref Range   Glucose-Capillary 146 (H) 65 - 99 mg/dL  Glucose, capillary     Status: Abnormal   Collection Time: 07/14/16 12:02 PM  Result Value Ref Range   Glucose-Capillary 171 (H) 65 - 99 mg/dL   Comment 1 Notify RN    Comment 2 Document in Chart   Glucose, capillary     Status: Abnormal   Collection Time: 07/14/16  4:38 PM  Result Value Ref Range   Glucose-Capillary 161 (H) 65 - 99 mg/dL   Comment 1 Notify RN   Glucose, capillary     Status: Abnormal   Collection Time: 07/14/16  8:35 PM  Result Value Ref Range   Glucose-Capillary 120 (H) 65 - 99 mg/dL   Comment 1 Notify RN   Glucose, capillary     Status: Abnormal   Collection Time: 07/15/16  6:45 AM  Result Value Ref Range   Glucose-Capillary 134 (H) 65 - 99 mg/dL  Glucose, capillary     Status: Abnormal   Collection Time: 07/15/16 11:35 AM  Result Value Ref Range   Glucose-Capillary 189 (H) 65 - 99 mg/dL   Comment 1 Notify RN    Comment 2 Document in Chart   Glucose,  capillary     Status: Abnormal   Collection Time: 07/15/16  4:35 PM  Result Value Ref Range   Glucose-Capillary 126 (H) 65 - 99 mg/dL    Blood Alcohol level:  No results found for: [facility]  Metabolic Disorder Labs: Lab Results  Component Value Date   HGBA1C 8.3 (H) 07/09/2016   MPG 192 07/09/2016   MPG 143 12/04/2008   No results found for: PROLACTIN No results found for: CHOL, TRIG, HDL, CHOLHDL, VLDL, LDLCALC  Physical Findings: AIMS:  , ,  ,  ,    CIWA:    COWS:     Musculoskeletal: Strength & Muscle Tone: within normal limits Gait & Station: normal Patient leans: N/A  Psychiatric Specialty Exam: Physical Exam  Constitutional: She is oriented to person, place, and time. She appears well-developed and well-nourished.  HENT:  Head: Normocephalic and atraumatic.  Eyes: EOM are normal.  Neck: Normal range of motion.  Respiratory: Effort normal.  Musculoskeletal: Normal range of motion.  Neurological: She is alert and oriented to person, place, and time.    Review of Systems  Eyes:       Dry eyes  Psychiatric/Behavioral: Positive for depression. Negative for hallucinations, substance abuse and suicidal ideas. The patient has insomnia.   All other systems reviewed and are negative.   Blood pressure 103/65, pulse 80, temperature 98.6 F (37 C), temperature source Oral, resp. rate 18, height 5\' 5"  (1.651 m), weight 77.6 kg (171 lb), SpO2 98 %.Body mass index is 28.46 kg/m.  General Appearance: Fairly Groomed  Eye Contact:  Fair  Speech:  Clear and Coherent and Normal Rate  Volume:  Decreased  Mood:  Depressed and Hopeless  Affect:  Depressed and Flat  Thought Process:  Coherent  Orientation:  Full (Time, Place, and Person)  Thought Content:  Logical  Suicidal Thoughts:  No  Homicidal Thoughts:  No  Memory:  Immediate;   Fair Recent;   Fair Remote;   Fair  Judgement:  Fair  Insight:  Fair  Psychomotor Activity:  Decreased  Concentration:  Concentration: Fair  and Attention Span: Fair  Recall:  [employer] of Knowledge:  Fair  Language:  Fair  Akathisia:  No  Handed:    AIMS (if indicated):     Assets:  Desire for Improvement Financial Resources/Insurance Housing Social Support  ADL's:  Intact  Cognition:  WNL  Sleep:  Number of Hours: 7     Treatment Plan Summary: Daily contact with patient to assess and evaluate symptoms and progress in treatment and Medication management   MDD: Discontinued effexor XR 300 mg and started immediate release 75 mg TID. Plan to [place] her off effexor which she has taken for >20 years. Pt had a positive response to prozac in the past. Will continueprozac 20 mg po q day.  Impulse control disorder (gambling addiction): continue individual psychotherapy.  Anxiety disorder (PTSD and OCD symptoms but does not meet full diagnosis): plan to target symptoms with fluoxetine.   Insomnia/antidepressant augmentation: I have increased dose of seroquel to 150 mg po qhs to try to improve sleep.   IBS: continueprobiotics  HTN: continue tenormin 50 mg.  BP and HR well controlled  DM: continue glipizide 2.5 mg plus supplemental insulin  GERD: continue PPI bid  OSA: Continue to encourage pt to use CPAP.   Narcolepsy: h/o narcolepsy diagnosed in 2015. In the past tried provigil, nuvigil and adderall + provigil. No recent episodes of cataplexy. Does not appear overly sedated this morning. We will consider adding an stimulant if significant sedation noted.  Thrombophlebitis: resolving  Dry eyes: I have ordered eye drops for symptom relief  Vitamin B12 deficiency:  Continue on B12 1000 mg injections qd for about 7 days  Vitamin D deficiency: continue patient on 400 units daily.   Diet: low sodium and carb modified  Precautions q 15 m checks  VS q day  Hospitalization IVC  Labs: TSH WNL, B12 low.   Case manager consult: pt in need of new PCP as she no longer can see her prior  doctor due to lack of insurance.    SW will refer her to med management clinic for med assistance  F[facility]  Possible d/c early next week  SW will make contact with pt's mother and daughter   Hildred Priest, MD 07/15/2016, 5:03 PM

## 2016-07-15 NOTE — Progress Notes (Signed)
Recreation Therapy Notes  Date: 10.12.17 Time: 1:00 pm Location: Craft Room  Group Topic: Leisure Education  Goal Area(s) Addresses:  Patient will identify activities for each letter of the alphabet. Patient will verbalize ability to integrate positive leisure into life post d/c. Patient will verbalize ability to use leisure as a Technical sales engineer.  Behavioral Response: Attentive, Interactive  Intervention: Leisure Alphabet  Activity: Patients were given a Leisure Air traffic controller and instructed to write healthy leisure activities for each letter of the alphabet.  Education: LRT educated patients on ways they can participate in leisure.  Education Outcome: Acknowledges education/In group clarification offered  Clinical Observations/Feedback: Patient completed activity by writing healthy leisure activities. Patient contributed to group discussion by stating leisure activities, how she can integrate leisure into her life, and what makes leisure a good coping skill.  Leonette Monarch, LRT/CTRS 07/15/2016 3:04 PM

## 2016-07-15 NOTE — Plan of Care (Signed)
Problem: Safety: Goal: Periods of time without injury will increase Outcome: Progressing Pt is progressing towards achieving these goals.

## 2016-07-15 NOTE — Progress Notes (Signed)
Awake, alert, up on unit most of the day interacting appropriately with peers/staff. Pleasant, cooperative. Continues to report passive SI "sometimes." Denies HI/AVH. Reports fair sleep with help from sleep medication, fair appetite, low energy, poor concentration. Rates depression, anxiety and hopelessness 9/10 (low 0-10 high). Goal for the day is to go to groups and talk to daughter. Focused on discharge, but reports she believes she will have issues with "not getting the therapy I need" once discharged. Medication complaint. Safety maintained. Pt did speak to her daughter on the telephone.  Safety maintained with every 15 minute checks. Medications administered as ordered. Support and encouragement provided. Will continue to monitor.

## 2016-07-15 NOTE — Discharge Summary (Signed)
[facility] NAME: Christine Cook    MR#:  [id number]  DATE OF BIRTH:  1975/11/04  DATE OF ADMISSION:  07/09/2016 ADMITTING PHYSICIAN: Theodoro Grist, MD  DATE OF DISCHARGE: 07/11/2016  5:59 PM  PRIMARY CARE PHYSICIAN: Marton Redwood, MD    ADMISSION DIAGNOSIS:  Encephalopathy acute [G93.40] Intentional drug overdose, initial encounter ([place]) [T50.902A]  DISCHARGE DIAGNOSIS:  Principal Problem:   Benzodiazepine overdose Active Problems:   GERD   Hypertension   Diabetes ([place])   SECONDARY DIAGNOSIS:   Past Medical History:  Diagnosis Date  . Anxiety   . Depression   . Diabetes ([place])   . Fatty liver disease, nonalcoholic   . Heart murmur   . Heart palpitations   . High cholesterol   . 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 Hypertension   . Irritable bowel syndrome with diarrhea   . 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 Migraine   . 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 Sleep apnea with hypersomnolence    CPAP study 11-28-09,  10-02-2009 AHI was 16.5, pressure to   . Tachycardia     HOSPITAL COURSE:   #1. Toxic metabolic encephalopathy due to hypercapnia, benzodiazepines overdose ,   Monitored in stepdown unit, improved with supportive therapy    Completely stable now- medically cleared for admission to inpatient psych.   #2 suicidal intent, IVC, psychiatric consultation,will benefit from psychiatric admission  #3. Diarrhea per report, , less likely to be infection.   She have IBS- give imodium. #4. Hypokalemia, supplement intravenously, normal magnesium level, supplement  #5. Hyperglycemia, high hemoglobin A1c - start oral glipizide. #6. History of depression, psychiatry consultation   DISCHARGE CONDITIONS:   Stable.  CONSULTS OBTAINED:  Treatment Team:  Gonzella Lex, MD  DRUG ALLERGIES:   Allergies  Allergen Reactions  . Codeine   . Morphine   . Neurontin  [Gabapentin] Other (See Comments)    syncope    DISCHARGE MEDICATIONS:   Discharge Medication List as of 07/11/2016  5:35 PM    START taking these medications   Details  glipiZIDE (GLUCOTROL XL) 2.5 MG 24 hr tablet Take 1 tablet (2.5 mg total) by mouth daily with breakfast., Starting Sun 07/11/2016, Normal    loperamide (IMODIUM) 2 MG capsule Take 1 capsule (2 mg total) by mouth every 6 (six) hours as needed for diarrhea or loose stools., Starting Sun 07/11/2016, Normal      CONTINUE these medications which have NOT CHANGED   Details  amphetamine-dextroamphetamine (ADDERALL) 10 MG tablet Take 1 tablet (10 mg total) by mouth every morning., Starting 08/05/2014, Until Discontinued, Print    ARIPiprazole (ABILIFY) 5 MG tablet Take 5 mg by mouth daily., Until Discontinued, Historical Med    Armodafinil 250 MG tablet Take 1 tablet (250 mg total) by mouth every morning., Starting 09/02/2014, Until Discontinued, Print    atenolol (TENORMIN) 50 MG tablet Take 50 mg by mouth at bedtime., Until Discontinued, Historical Med    diphenoxylate-atropine (LOMOTIL) 2.5-0.025 MG per tablet TAKE ONE TABLET BY MOUTH TWICE DAILY, Print    glycopyrrolate (ROBINUL) 2 MG tablet Take 1 tablet (2 mg total) by mouth 2 (two) times daily., Starting 12/11/2013, Until Discontinued, Normal    ondansetron (ZOFRAN) 4 MG tablet Take 1 tab every 6 hours as needed for cramping, spasms diarrhea., Normal    pantoprazole (PROTONIX) 40 MG tablet Take 40 mg by mouth 2 (two) times daily.,  Until Discontinued, Historical Med    Probiotic Product (ALIGN) 4 MG CAPS Take 1 capsule by mouth daily., Until Discontinued, Historical Med    Sodium Oxybate 500 MG/ML SOLN Starter dose of XYREM, 2.25 gram twice at night for 7 days, than 3 gram 7 days , two doses at night. Than 4.5 gram final dose , twice nightly., Print    venlafaxine XR (EFFEXOR-XR) 150 MG 24 hr capsule Take 300 mg by mouth daily with breakfast., Until Discontinued,  Historical Med      STOP taking these medications     lisinopril (PRINIVIL,ZESTRIL) 20 MG tablet          DISCHARGE INSTRUCTIONS:    Follow with psych as advised.  If you experience worsening of your admission symptoms, develop shortness of breath, life threatening emergency, suicidal or homicidal thoughts you must seek medical attention immediately by calling 911 or calling your MD immediately  if symptoms less severe.  You Must read complete instructions/literature along with all the possible adverse reactions/side effects for all the Medicines you take and that have been prescribed to you. Take any new Medicines after you have completely understood and accept all the possible adverse reactions/side effects.   Please note  You were cared for by a hospitalist during your hospital stay. If you have any questions about your discharge medications or the care you received while you were in the hospital after you are discharged, you can call the unit and asked to speak with the hospitalist on call if the hospitalist that took care of you is not available. Once you are discharged, your primary care physician will handle any further medical issues. Please note that NO REFILLS for any discharge medications will be authorized once you are discharged, as it is imperative that you return to your primary care physician (or establish a relationship with a primary care physician if you do not have one) for your aftercare needs so that they can reassess your need for medications and monitor your lab values.    Today   CHIEF COMPLAINT:   Chief Complaint  Patient presents with  . Drug Overdose    HISTORY OF PRESENT ILLNESS:  Christine Cook  is a 40 y.o. female with a known history of Anxiety, depression, diabetes mellitus, fatty liver disease, obesity, hyperlipidemia, hypersomnia, hypertension, migraine headaches, obstructive sleep apnea, who presents to the hospital with complaints of wanting to  end her life. She took some Effexor yesterday, however, took 50 pills of Xanax 0.5 mg, she was supposed to take this medication twice a day. She takes that her friend, who contacted patient's mother, who brought her to the hospital for further evaluation and treatment. On arrival to the hospital on a patient was somnolent, had ABGs done, which revealed normal pH, but mildly elevated PCO2, labs revealed hypokalemia, hyperglycemia, urine drug screen was positive for benzodiazepines. Chest x-ray revealed low lung volumes, mild basilar atelectasis. Patient is not able to provide review of systems, however, if she is awakened briefly, able to answer some questions and injured his back to sleep Patient admitted of diarrhea over the past few weeks and some abdominal pain with diarrhea   VITAL SIGNS:  Blood pressure 129/77, pulse 96, temperature 98.5 F (36.9 C), temperature source Oral, resp. rate 18, height 5\' 5"  (1.651 m), weight 79.3 kg (174 lb 13.2 oz), SpO2 97 %.  I/O:  No intake or output data in the 24 hours ending 07/15/16 0736  PHYSICAL EXAMINATION:  GENERAL:  40  y.o.-year-old patient lying in the bed with no acute distress.  EYES: Pupils equal, round, reactive to light and accommodation. No scleral icterus. Extraocular muscles intact.  HEENT: Head atraumatic, normocephalic. Oropharynx and nasopharynx clear.  NECK:  Supple, no jugular venous distention. No thyroid enlargement, no tenderness.  LUNGS: Normal breath sounds bilaterally, no wheezing, rales,rhonchi or crepitation. No use of accessory muscles of respiration.  CARDIOVASCULAR: S1, S2 normal. No murmurs, rubs, or gallops.  ABDOMEN: Soft, non-tender, non-distended. Bowel sounds present. No organomegaly or mass.  EXTREMITIES: No pedal edema, cyanosis, or clubbing.  NEUROLOGIC: Cranial nerves II through XII are intact. Muscle strength 5/5 in all extremities. Sensation intact. Gait not checked.  PSYCHIATRIC: The patient is alert and  oriented x 3.  SKIN: No obvious rash, lesion, or ulcer.   DATA REVIEW:   CBC  Recent Labs Lab 07/10/16 0443  WBC 8.6  HGB 12.8  HCT 38.4  PLT 247    Chemistries   Recent Labs Lab 07/09/16 1118  07/11/16 1129  NA 138  < > 136  K 3.2*  < > 3.3*  CL 104  < > 104  CO2 28  < > 25  GLUCOSE 144*  < > 129*  BUN <5*  < > <5*  CREATININE 0.72  < > 0.70  CALCIUM 8.7*  < > 8.7*  MG 1.8  --   --   AST 19  --   --   ALT 16  --   --   ALKPHOS 74  --   --   BILITOT 0.6  --   --   < > = values in this interval not displayed.  Cardiac Enzymes No results for input(s): TROPONINI in the last 168 hours.  Microbiology Results  Results for orders placed or performed during the hospital encounter of 07/09/16  MRSA PCR Screening     Status: None   Collection Time: 07/09/16  6:23 PM  Result Value Ref Range Status   MRSA by PCR NEGATIVE NEGATIVE Final    Comment:        The GeneXpert MRSA Assay (FDA approved for NASAL specimens only), is one component of a comprehensive MRSA colonization surveillance program. It is not intended to diagnose MRSA infection nor to guide or monitor treatment for MRSA infections.   Gastrointestinal Panel by PCR , Stool     Status: None   Collection Time: 07/11/16  1:15 PM  Result Value Ref Range Status   Campylobacter species NOT DETECTED NOT DETECTED Final   Plesimonas shigelloides NOT DETECTED NOT DETECTED Final   Salmonella species NOT DETECTED NOT DETECTED Final   Yersinia enterocolitica NOT DETECTED NOT DETECTED Final   Vibrio species NOT DETECTED NOT DETECTED Final   Vibrio cholerae NOT DETECTED NOT DETECTED Final   Enteroaggregative E coli (EAEC) NOT DETECTED NOT DETECTED Final   Enteropathogenic E coli (EPEC) NOT DETECTED NOT DETECTED Final   Enterotoxigenic E coli (ETEC) NOT DETECTED NOT DETECTED Final   Shiga like toxin producing E coli (STEC) NOT DETECTED NOT DETECTED Final   Shigella/Enteroinvasive E coli (EIEC) NOT DETECTED NOT  DETECTED Final   Cryptosporidium NOT DETECTED NOT DETECTED Final   Cyclospora cayetanensis NOT DETECTED NOT DETECTED Final   Entamoeba histolytica NOT DETECTED NOT DETECTED Final   Giardia lamblia NOT DETECTED NOT DETECTED Final   Adenovirus F40/41 NOT DETECTED NOT DETECTED Final   Astrovirus NOT DETECTED NOT DETECTED Final   Norovirus GI/GII NOT DETECTED NOT DETECTED Final   Rotavirus A  NOT DETECTED NOT DETECTED Final   Sapovirus (I, II, IV, and V) NOT DETECTED NOT DETECTED Final  C difficile quick scan w PCR reflex     Status: None   Collection Time: 07/11/16  1:15 PM  Result Value Ref Range Status   C Diff antigen NEGATIVE NEGATIVE Final   C Diff toxin NEGATIVE NEGATIVE Final   C Diff interpretation No C. difficile detected.  Final    RADIOLOGY:  No results found.  EKG:   Orders placed or performed during the hospital encounter of 07/09/16  . ED EKG  . ED EKG  . EKG 12-Lead  . EKG 12-Lead  . EKG    Management plans discussed with the patient, family and they are in agreement.  CODE STATUS:  Code Status History    Date Active Date Inactive Code Status Order ID Comments User Context   07/09/2016  6:21 PM 07/11/2016  6:01 PM Full Code [id number]  Theodoro Grist, MD Inpatient      TOTAL TIME TAKING CARE OF THIS PATIENT: 35 minutes.    Vaughan Basta M.D on 07/15/2016 at 7:36 AM  Between 7am to 6pm - Pager - [phone]  After 6pm go to [email or web address] - password [email or web address] [facility]  Office  [phone]  CC: Primary care physician; Marton Redwood, MD   Note: This dictation was prepared with Dragon dictation along with smaller phrase technology. Any transcriptional errors that result from this process are unintentional.

## 2016-07-15 NOTE — Plan of Care (Signed)
Problem: Activity: Goal: Imbalance in normal sleep/wake cycle will improve Outcome: Progressing Reports fair sleep with the help of sleep medication.

## 2016-07-16 LAB — GLUCOSE, CAPILLARY
Glucose-Capillary: 127 mg/dL — ABNORMAL HIGH (ref 65–99)
Glucose-Capillary: 206 mg/dL — ABNORMAL HIGH (ref 65–99)
Glucose-Capillary: 160 mg/dL — ABNORMAL HIGH (ref 65–99)
Glucose-Capillary: 83 mg/dL (ref 65–99)

## 2016-07-16 NOTE — [facility] Group Notes (Signed)
[facility] LCSW Group Therapy  07/16/2016 9:30 AM  Type of Therapy: Group Therapy Participation Level: Active  Participation Quality: Attentive, Sharing and Supportive  Affect: Appropriate  Cognitive: Alert and Oriented  Insight: Developing/Improving and Engaged  Engagement in Therapy: Developing/Improving and Engaged  Modes of Intervention: Clarification, Confrontation, Discussion, Education, Exploration, Limit-setting, Orientation, Problem-solving, Rapport Building, Art therapist, Socialization and Support  Summary of Progress/Problems: The topic for today was feelings about relapse. Pt discussed what relapse prevention is to them and identified triggers that they are on the path to relapse. Pt processed their feeling towards relapse and was able to relate to peers. Pt discussed coping skills that can be used for relapse prevention. This patient reports that she has an idea now that she has anger, self defeating thoughts and has learned to give herself to feel her feelings then will move onto important tasks for her wellness. She reports she will benefit from journalling, music and setting a good routine will help with her wellness goals.  Damante Spragg [place], LCSW

## 2016-07-16 NOTE — Progress Notes (Signed)
Pt is refusing CPAP

## 2016-07-16 NOTE — Progress Notes (Addendum)
[facility] MD Progress Note  07/16/2016 3:04 PM Christine Cook  MRN:  [id number] Subjective:  40 y/o F admitted on 10/5 after overdosing on alprazolam (50 tabs). Pt with a h/o depression and gambling addiction. Currently dealing with legal charges and lost her job on 10/5.   Patient says that some days are better standing or others. She is no longer having suicidal thoughts but feels very nervous and doesn't trust herself. She says she is hopelessness. She is afraid of having to do with all of her problems once discharged. The patient is facing felony charges for stealing, she also lost her job and is being forced to move back with her parents. Christine Cook still very depressed, appetite is fair, energy is low as she has not been sleeping. She said that sleep has been an issue. We increase the Seroquel last night however she said that in the evening there was a lot of commotion because another patient was very agitated and she just couldn't fall asleep but she felt unsafe.  Denies homicidal ideation, auditory or visual hallucinations. Patient denies any physical complaints today.  Per nursing staff:  Pt awake, alert, up on unit this morning. No complaints at this time. Denies SI/HI/AVH. Reports fair sleep last night because there was "too much going on outside my room." Reports sleep medication was helpful. Fair appetite, low energy, poor concentration. Rated depression, anxiety and hopelessness 8/10 ( low 0-10 high). Medication complaint. Pt is pleasant, brightens on approach, less anxious. Interacts appropriately with peers.   Safety maintained with every 15 minute checks. Medications administered as ordered. Support and encouragement provided. Will continue to monitor.   Principal Problem: MDD (major depressive disorder) Diagnosis:   Patient Active Problem List   Diagnosis Date Noted  . OSA (obstructive sleep apnea) [G47.33] 07/12/2016  . 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 Vitamin B12 deficiency  [E53.8] 07/12/2016  . Vitamin D deficiency [E55.9] 07/12/2016  . Diabetes ([place]) [E11.9] 07/11/2016  . MDD (major depressive disorder) [F32.9] 07/11/2016  . Impulse control disorder (gambling) [F63.9] 07/11/2016  . Hypertension [I10] 07/09/2016  . Benzodiazepine overdose [T42.4X1A] 07/09/2016  . GERD [K21.9] 01/21/2009   Total Time spent with patient: 30 minutes  Past Psychiatric History:   Past Medical History:  Past Medical History:  Diagnosis Date  . Anxiety   . Depression   . Diabetes ([place])   . Fatty liver disease, nonalcoholic   . Heart murmur   . Heart palpitations   . High cholesterol   . 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 Hypertension   . Irritable bowel syndrome with diarrhea   . 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 Migraine   . 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 Sleep apnea with hypersomnolence    CPAP study 11-28-09,  10-02-2009 AHI was 16.5, pressure to   . Tachycardia     Past Surgical History:  Procedure Laterality Date  . ADENOIDECTOMY    . BLADDER SURGERY    . CHOLECYSTECTOMY    . KNEE SURGERY     Left knee x 2   . TONSILLECTOMY     Family History:  Family History  Problem Relation Age of Onset  . Asthma Mother   . Hypertension Father   . Coronary artery disease Father   . Diabetes Father   . Asthma Daughter   . Colon cancer Neg Hx    Family Psychiatric  History:   Social History:  History  Alcohol Use No  History  Drug Use No    Social History   Social History  . Marital status: Single    Spouse name: N/A  . Number of children: 1  . Years of education: College   Occupational History  . Support specialist   .  [employer]   Social History Main Topics  . Smoking status: Former Smoker    Packs/day: 0.50    Years: 2.00    Types: Cigarettes    Quit date: 02/14/1995  . Smokeless tobacco: Never Used  . Alcohol use No  . Drug use: No  . Sexual activity: Not Asked   Other Topics Concern   . None   Social History Narrative   Patient is single and lives alone.   Patient works at [employer].   Patient has a college education.   Patient has one child.   Patient is right-handed.   Patient drinks two 20 oz of Coke daily.   Additional Social History:     Sleep: Poor  Appetite:  Good  Current Medications: Current Facility-Administered Medications  Medication Dose Route Frequency Provider Last Rate Last Dose  . acetaminophen (TYLENOL) tablet 650 mg  650 mg Oral Q6H PRN Hildred Priest, MD   650 mg at 07/15/16 2223  . acidophilus (RISAQUAD) capsule 1 capsule  1 capsule Oral Daily Hildred Priest, MD   1 capsule at 07/16/16 0814  . alum & mag hydroxide-simeth (MAALOX/MYLANTA) 200-200-20 MG/5ML suspension 30 mL  30 mL Oral Q4H PRN Hildred Priest, MD   30 mL at 07/13/16 1639  . atenolol (TENORMIN) tablet 50 mg  50 mg Oral QHS Hildred Priest, MD   50 mg at 07/15/16 2222  . cholecalciferol (VITAMIN D) tablet 400 Units  400 Units Oral Daily Hildred Priest, MD   400 Units at 07/16/16 [id number]  . cyanocobalamin ((VITAMIN B-12)) injection 1,000 mcg  1,000 mcg Intramuscular Daily Hildred Priest, MD   1,000 mcg at 07/16/16 0814  . FLUoxetine (PROZAC) capsule 20 mg  20 mg Oral Daily Hildred Priest, MD   20 mg at 07/16/16 0814  . glipiZIDE (GLUCOTROL XL) 24 hr tablet 2.5 mg  2.5 mg Oral Q breakfast Hildred Priest, MD   2.5 mg at 07/16/16 0814  . insulin aspart (novoLOG) injection 0-5 Units  0-5 Units Subcutaneous QHS Hildred Priest, MD      . insulin aspart (novoLOG) injection 0-9 Units  0-9 Units Subcutaneous TID WC Hildred Priest, MD   3 Units at 07/16/16 1146  . ketotifen (ZADITOR) 0.025 % ophthalmic solution 1 drop  1 drop Both Eyes BID Hildred Priest, MD   1 drop at 07/16/16 0815  . magnesium hydroxide (MILK OF MAGNESIA) suspension 30 mL  30 mL Oral Daily PRN Hildred Priest, MD      . menthol-cetylpyridinium (CEPACOL) lozenge 3 mg  1 lozenge Oral PRN Hildred Priest, MD   3 mg at 07/13/16 0645  . nicotine (NICODERM CQ - dosed in mg/24 hours) patch 21 mg  21 mg Transdermal Daily Hildred Priest, MD   21 mg at 07/16/16 0816  . pantoprazole (PROTONIX) EC tablet 40 mg  40 mg Oral BID Rainey Pines, MD   40 mg at 07/16/16 0814  . QUEtiapine (SEROQUEL) tablet 150 mg  150 mg Oral QHS Hildred Priest, MD   150 mg at 07/15/16 2222  . venlafaxine (EFFEXOR) tablet 75 mg  75 mg Oral TID WC Hildred Priest, MD   75 mg at 07/16/16 1146    Lab Results:  Results for orders placed or performed during the hospital encounter of 07/11/16 (from the past 48 hour(s))  Glucose, capillary     Status: Abnormal   Collection Time: 07/14/16  4:38 PM  Result Value Ref Range   Glucose-Capillary 161 (H) 65 - 99 mg/dL   Comment 1 Notify RN   Glucose, capillary     Status: Abnormal   Collection Time: 07/14/16  8:35 PM  Result Value Ref Range   Glucose-Capillary 120 (H) 65 - 99 mg/dL   Comment 1 Notify RN   Glucose, capillary     Status: Abnormal   Collection Time: 07/15/16  6:45 AM  Result Value Ref Range   Glucose-Capillary 134 (H) 65 - 99 mg/dL  Glucose, capillary     Status: Abnormal   Collection Time: 07/15/16 11:35 AM  Result Value Ref Range   Glucose-Capillary 189 (H) 65 - 99 mg/dL   Comment 1 Notify RN    Comment 2 Document in Chart   Glucose, capillary     Status: Abnormal   Collection Time: 07/15/16  4:35 PM  Result Value Ref Range   Glucose-Capillary 126 (H) 65 - 99 mg/dL  Glucose, capillary     Status: Abnormal   Collection Time: 07/15/16  9:10 PM  Result Value Ref Range   Glucose-Capillary 106 (H) 65 - 99 mg/dL   Comment 1 Notify RN   Glucose, capillary     Status: Abnormal   Collection Time: 07/16/16  6:42 AM  Result Value Ref Range   Glucose-Capillary 127 (H) 65 - 99 mg/dL   Comment 1 Notify RN    Comment  2 Document in Chart   Glucose, capillary     Status: Abnormal   Collection Time: 07/16/16 11:43 AM  Result Value Ref Range   Glucose-Capillary 206 (H) 65 - 99 mg/dL    Blood Alcohol level:  No results found for: [facility]  Metabolic Disorder Labs: Lab Results  Component Value Date   HGBA1C 8.3 (H) 07/09/2016   MPG 192 07/09/2016   MPG 143 12/04/2008   No results found for: PROLACTIN No results found for: CHOL, TRIG, HDL, CHOLHDL, VLDL, LDLCALC  Physical Findings: AIMS:  , ,  ,  ,    CIWA:    COWS:     Musculoskeletal: Strength & Muscle Tone: within normal limits Gait & Station: normal Patient leans: N/A  Psychiatric Specialty Exam: Physical Exam  Constitutional: She is oriented to person, place, and time. She appears well-developed and well-nourished.  HENT:  Head: Normocephalic and atraumatic.  Eyes: EOM are normal.  Neck: Normal range of motion.  Respiratory: Effort normal.  Musculoskeletal: Normal range of motion.  Neurological: She is alert and oriented to person, place, and time.    Review of Systems  Eyes:       Dry eyes  Psychiatric/Behavioral: Positive for depression. Negative for hallucinations, substance abuse and suicidal ideas. The patient has insomnia.   All other systems reviewed and are negative.   Blood pressure 108/72, pulse 75, temperature 98.1 F (36.7 C), temperature source Oral, resp. rate 18, height 5\' 5"  (1.651 m), weight 77.6 kg (171 lb), SpO2 100 %.Body mass index is 28.46 kg/m.  General Appearance: Fairly Groomed  Eye Contact:  Fair  Speech:  Clear and Coherent and Normal Rate  Volume:  Decreased  Mood:  Depressed and Hopeless  Affect:  Depressed and Flat  Thought Process:  Coherent  Orientation:  Full (Time, Place, and Person)  Thought Content:  Logical  Suicidal Thoughts:  No  Homicidal Thoughts:  No  Memory:  Immediate;   Fair Recent;   Fair Remote;   Fair  Judgement:  Fair  Insight:  Fair  Psychomotor Activity:  Decreased   Concentration:  Concentration: Fair and Attention Span: Fair  Recall:  [employer] of Knowledge:  Fair  Language:  Fair  Akathisia:  No  Handed:    AIMS (if indicated):     Assets:  Desire for Improvement Financial Resources/Insurance Housing Social Support  ADL's:  Intact  Cognition:  WNL  Sleep:  Number of Hours: 6.5     Treatment Plan Summary: Daily contact with patient to assess and evaluate symptoms and progress in treatment and Medication management   MDD: Discontinued effexor XR 300 mg and started immediate release 75 mg TID. Plan to [place] her off effexor which she has taken for >20 years. Pt had a positive response to prozac in the past. Will continueprozac 20 mg po q day.  No significant change in mood since admission  Impulse control disorder (gambling addiction): continue individual psychotherapy.  Anxiety disorder (PTSD and OCD symptoms but does not meet full diagnosis): plan to target symptoms with fluoxetine.   Insomnia/antidepressant augmentation: Continue Seroquel 150 mg by mouth daily at bedtime  IBS: continueprobiotics  HTN: continue tenormin 50 mg.  BP and HR well controlled  DM: continue glipizide 2.5 mg plus supplemental insulin  GERD: continue PPI bid  OSA: Continue to encourage pt to use CPAP.   Narcolepsy: h/o narcolepsy diagnosed in 2015. In the past tried provigil, nuvigil and adderall + provigil. No recent episodes of cataplexy. Does not appear overly sedated this morning. We will consider adding an stimulant if significant sedation noted.  Thrombophlebitis: resolving  Dry eyes: I have ordered eye drops for symptom relief  Vitamin B12 deficiency:  Continue on B12 1000 mg injections qd for about 7 days  Vitamin D deficiency: continue patient on 400 units daily.   Diet: low sodium and carb modified  Precautions q 15 m checks  VS q day  Hospitalization IVC  Labs: TSH WNL, B12 low.   Case manager  consult: pt in need of new PCP as she no longer can see her prior doctor due to lack of insurance.    SW will refer her to med management clinic for med assistance  [facility]  Possible d/c early next week  SW will make contact with pt's mother and daughter   Hildred Priest, MD 07/16/2016, 3:04 PM

## 2016-07-16 NOTE — Progress Notes (Signed)
Pt refused CPAP

## 2016-07-16 NOTE — Progress Notes (Addendum)
Pt vomited into trash can while trying to swallow her morning medications. Reports her medications taste bad. Requested juice to rinse the taste, juice provided, then patient went to breakfast. Will continue to monitor.

## 2016-07-16 NOTE — Plan of Care (Signed)
Problem: Health Behavior/Discharge Planning: Goal: Compliance with therapeutic regimen will improve Outcome: Progressing Medication complaint. Attends groups. Receptive to treatment goals.

## 2016-07-16 NOTE — Progress Notes (Signed)
Recreation Therapy Notes  Date: 10.13.17 Time: 1:00 pm Location: Craft Room  Group Topic: Communication, Problem solving, Teamwork  Goal Area(s) Addresses:  Patient will effectively work with peers towards shared goal. Patient will identify skills used to make activity successful. Patient will identify benefit of using group skills effectively post d/c.  Behavioral Response: Did not attend   Intervention: [employer]  Activity: Patients were given 15 pipe cleaners and were instructed to build a free standing tower. Patients were given 2 minutes to strategize. After about 5 minutes of building, patients were instructed to put their hands behind their backs. After another 5 minutes of building, patients were instructed to stop talking to each other.  Education: LRT educated patients on healthy support systems.  Education Outcome: Patient did not attend group.   Clinical Observations/Feedback: Patient invited to attend, but did not attend group.  Leonette Monarch, LRT/CTRS 07/16/2016 1:43 PM

## 2016-07-16 NOTE — Progress Notes (Signed)
D: Pt denies SI/HI/AVH. Pt is pleasant and cooperative, affect is flat but brightens upon approach. Pt appears less anxious and she is interacting with peers and staff appropriately.  A: Pt was offered support and encouragement. Pt was given scheduled medications. Pt was encouraged to attend groups. Q 15 minute checks were done for safety.  R:Pt attends groups and interacts well with peers and staff. Pt is taking medication. Pt has no complaints.Pt receptive to treatment and safety maintained on unit.   

## 2016-07-16 NOTE — Progress Notes (Signed)
Pt awake, alert, up on unit this morning. No complaints at this time. Denies SI/HI/AVH. Reports fair sleep last night because there was "too much going on outside my room." Reports sleep medication was helpful. Fair appetite, low energy, poor concentration. Rated depression, anxiety and hopelessness 8/10 ( low 0-10 high). Medication complaint. Pt is pleasant, brightens on approach, less anxious. Interacts appropriately with peers.   Safety maintained with every 15 minute checks. Medications administered as ordered. Support and encouragement provided. Will continue to monitor.

## 2016-07-16 NOTE — [facility] Group Notes (Signed)
[facility] Group Notes:  (Nursing/MHT/Case Management/Adjunct)  Date:  07/16/2016  Time:  4:29 PM  Type of Therapy:  Psychoeducational Skills  Participation Level:  Did Not Attend   Christine Cook [name] 07/16/2016, 4:29 PM

## 2016-07-16 NOTE — Progress Notes (Signed)
D: Pt upset at beginning of shift due to someone from her church working on the unit. Charge and [facility] notified. Care was taken to ensure that the two had minimal interaction as much as possible. Pt stated " We all have also stayed in our room all day bc of other Pts being inappropriate. I am here for help and in groups with others that are inappropriate for the group and it causes those of [place] who are wanting help to not be able to receive it here". Denies SI/HI/AVH. Affect is anxious and depressed. Visible in milieu at times. Medications compliant.  A: Encouragement and support provided. Medications given as prescribed with education. Active listening. q15 minute checks maintained for safety.  R: Pt remains safe on unit. Appropriate interaction with staff and peers. Will continue to monitor.

## 2016-07-17 LAB — GLUCOSE, CAPILLARY
Glucose-Capillary: 155 mg/dL — ABNORMAL HIGH (ref 65–99)
Glucose-Capillary: 225 mg/dL — ABNORMAL HIGH (ref 65–99)
Glucose-Capillary: 109 mg/dL — ABNORMAL HIGH (ref 65–99)
Glucose-Capillary: 195 mg/dL — ABNORMAL HIGH (ref 65–99)

## 2016-07-17 MED ORDER — FLUOXETINE HCL 20 MG PO CAPS
30.0000 mg | ORAL_CAPSULE | Freq: Every morning | ORAL | Status: DC
  Administered 2016-07-18 – 2016-07-20 (×3): 30 mg via ORAL
  Filled 2016-07-17 (×3): qty 1

## 2016-07-17 MED ORDER — VENLAFAXINE HCL 37.5 MG PO TABS
75.0000 mg | ORAL_TABLET | Freq: Two times a day (BID) | ORAL | Status: DC
  Administered 2016-07-17 – 2016-07-20 (×6): 75 mg via ORAL
  Filled 2016-07-17 (×6): qty 2

## 2016-07-17 NOTE — Progress Notes (Signed)
[facility] MD Progress Note  07/17/2016 5:10 PM Christine Cook  MRN:  [id number] Subjective:  40 y/o  Female  admitted on  after overdosing on alprazolam (50 tabs). Pt with a h/o depression and gambling addiction. Currently dealing with legal charges and lost her job on 10/5.   During my interview patient reported that she has been feeling unsafe as the people were acting out. Now she feels that things are improving. She has been compliant with her educations. Patient reported that she is not experiencing any side effects of medication She is no longer having suicidal thoughts but feels very nervous and doesn't trust herself. She says she is hopelessness. She is afraid of having to do with all of her problems once discharged. The patient is facing felony charges for stealing, she also lost her job and is being forced to move back with her parents.   Denies homicidal ideation, auditory or visual hallucinations. Patient denies any physical complaints today.   She wants her medications to be adjusted as she reported that she did well on the Prozac and wants to decrease the dose of the Effexor. We discussed about the medication and she agreed with the plan.   Per nursing staff:  Pt awake, alert, up on unit this morning. No complaints at this time. Denies SI/HI/AVH. Reports fair sleep last night because there was "too much going on outside my room." Reports sleep medication was helpful. Fair appetite, low energy, poor concentration. Rated depression, anxiety and hopelessness 8/10 ( low 0-10 high). Medication complaint. Pt is pleasant, brightens on approach, less anxious. Interacts appropriately with peers.   Safety maintained with every 15 minute checks. Medications administered as ordered. Support and encouragement provided. Will continue to monitor.   Principal Problem: MDD (major depressive disorder) Diagnosis:   Patient Active Problem List   Diagnosis Date Noted  . OSA (obstructive sleep apnea)  [G47.33] 07/12/2016  . 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 Vitamin B12 deficiency [E53.8] 07/12/2016  . Vitamin D deficiency [E55.9] 07/12/2016  . Diabetes ([place]) [E11.9] 07/11/2016  . MDD (major depressive disorder) [F32.9] 07/11/2016  . Impulse control disorder (gambling) [F63.9] 07/11/2016  . Hypertension [I10] 07/09/2016  . Benzodiazepine overdose [T42.4X1A] 07/09/2016  . GERD [K21.9] 01/21/2009   Total Time spent with patient: 30 minutes  Past Psychiatric History:   Past Medical History:  Past Medical History:  Diagnosis Date  . Anxiety   . Depression   . Diabetes ([place])   . Fatty liver disease, nonalcoholic   . Heart murmur   . Heart palpitations   . High cholesterol   . 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 Hypertension   . Irritable bowel syndrome with diarrhea   . 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 Migraine   . 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 Sleep apnea with hypersomnolence    CPAP study 11-28-09,  10-02-2009 AHI was 16.5, pressure to   . Tachycardia     Past Surgical History:  Procedure Laterality Date  . ADENOIDECTOMY    . BLADDER SURGERY    . CHOLECYSTECTOMY    . KNEE SURGERY     Left knee x 2   . TONSILLECTOMY     Family History:  Family History  Problem Relation Age of Onset  . Asthma Mother   . Hypertension Father   . Coronary artery disease Father   . Diabetes Father   . Asthma Daughter   . Colon cancer Neg Hx  Family Psychiatric  History:   Social History:  History  Alcohol Use No     History  Drug Use No    Social History   Social History  . Marital status: Single    Spouse name: N/A  . Number of children: 1  . Years of education: College   Occupational History  . Support specialist   .  [employer]   Social History Main Topics  . Smoking status: Former Smoker    Packs/day: 0.50    Years: 2.00    Types: Cigarettes    Quit date: 02/14/1995  . Smokeless tobacco: Never Used  . Alcohol  use No  . Drug use: No  . Sexual activity: Not Asked   Other Topics Concern  . None   Social History Narrative   Patient is single and lives alone.   Patient works at [employer].   Patient has a college education.   Patient has one child.   Patient is right-handed.   Patient drinks two 20 oz of Coke daily.   Additional Social History:     Sleep: Poor  Appetite:  Good  Current Medications: Current Facility-Administered Medications  Medication Dose Route Frequency Provider Last Rate Last Dose  . acetaminophen (TYLENOL) tablet 650 mg  650 mg Oral Q6H PRN Hildred Priest, MD   650 mg at 07/15/16 2223  . acidophilus (RISAQUAD) capsule 1 capsule  1 capsule Oral Daily Hildred Priest, MD   1 capsule at 07/17/16 0758  . alum & mag hydroxide-simeth (MAALOX/MYLANTA) 200-200-20 MG/5ML suspension 30 mL  30 mL Oral Q4H PRN Hildred Priest, MD   30 mL at 07/13/16 1639  . atenolol (TENORMIN) tablet 50 mg  50 mg Oral QHS Hildred Priest, MD   50 mg at 07/16/16 2147  . cholecalciferol (VITAMIN D) tablet 400 Units  400 Units Oral Daily Hildred Priest, MD   400 Units at 07/17/16 0759  . cyanocobalamin ((VITAMIN B-12)) injection 1,000 mcg  1,000 mcg Intramuscular Daily Hildred Priest, MD   1,000 mcg at 07/17/16 0800  . FLUoxetine (PROZAC) capsule 30 mg  30 mg Oral q morning - 10a Rainey Pines, MD      . glipiZIDE (GLUCOTROL XL) 24 hr tablet 2.5 mg  2.5 mg Oral Q breakfast Hildred Priest, MD   2.5 mg at 07/17/16 0800  . insulin aspart (novoLOG) injection 0-5 Units  0-5 Units Subcutaneous QHS Hildred Priest, MD      . insulin aspart (novoLOG) injection 0-9 Units  0-9 Units Subcutaneous TID WC Hildred Priest, MD   3 Units at 07/17/16 1152  . magnesium hydroxide (MILK OF MAGNESIA) suspension 30 mL  30 mL Oral Daily PRN Hildred Priest, MD      . menthol-cetylpyridinium (CEPACOL) lozenge 3 mg  1 lozenge Oral  PRN Hildred Priest, MD   3 mg at 07/13/16 0645  . nicotine (NICODERM CQ - dosed in mg/24 hours) patch 21 mg  21 mg Transdermal Daily Hildred Priest, MD   21 mg at 07/17/16 0800  . pantoprazole (PROTONIX) EC tablet 40 mg  40 mg Oral BID Rainey Pines, MD   40 mg at 07/17/16 0759  . QUEtiapine (SEROQUEL) tablet 150 mg  150 mg Oral QHS Hildred Priest, MD   150 mg at 07/16/16 2147  . venlafaxine (EFFEXOR) tablet 75 mg  75 mg Oral BID WC Rainey Pines, MD        Lab Results:  Results for orders placed or performed during the hospital encounter  of 07/11/16 (from the past 48 hour(s))  Glucose, capillary     Status: Abnormal   Collection Time: 07/15/16  9:10 PM  Result Value Ref Range   Glucose-Capillary 106 (H) 65 - 99 mg/dL   Comment 1 Notify RN   Glucose, capillary     Status: Abnormal   Collection Time: 07/16/16  6:42 AM  Result Value Ref Range   Glucose-Capillary 127 (H) 65 - 99 mg/dL   Comment 1 Notify RN    Comment 2 Document in Chart   Glucose, capillary     Status: Abnormal   Collection Time: 07/16/16 11:43 AM  Result Value Ref Range   Glucose-Capillary 206 (H) 65 - 99 mg/dL  Glucose, capillary     Status: Abnormal   Collection Time: 07/16/16  5:08 PM  Result Value Ref Range   Glucose-Capillary 160 (H) 65 - 99 mg/dL  Glucose, capillary     Status: None   Collection Time: 07/16/16  8:41 PM  Result Value Ref Range   Glucose-Capillary 83 65 - 99 mg/dL   Comment 1 Notify RN   Glucose, capillary     Status: Abnormal   Collection Time: 07/17/16  6:33 AM  Result Value Ref Range   Glucose-Capillary 155 (H) 65 - 99 mg/dL   Comment 1 Notify RN   Glucose, capillary     Status: Abnormal   Collection Time: 07/17/16 11:45 AM  Result Value Ref Range   Glucose-Capillary 225 (H) 65 - 99 mg/dL   Comment 1 Notify RN    Comment 2 Document in Chart   Glucose, capillary     Status: Abnormal   Collection Time: 07/17/16  4:42 PM  Result Value Ref Range    Glucose-Capillary 109 (H) 65 - 99 mg/dL   Comment 1 Document in Chart     Blood Alcohol level:  No results found for: [facility]  Metabolic Disorder Labs: Lab Results  Component Value Date   HGBA1C 8.3 (H) 07/09/2016   MPG 192 07/09/2016   MPG 143 12/04/2008   No results found for: PROLACTIN No results found for: CHOL, TRIG, HDL, CHOLHDL, VLDL, LDLCALC  Physical Findings: AIMS:  , ,  ,  ,    CIWA:    COWS:     Musculoskeletal: Strength & Muscle Tone: within normal limits Gait & Station: normal Patient leans: N/A  Psychiatric Specialty Exam: Physical Exam  Constitutional: She is oriented to person, place, and time. She appears well-developed and well-nourished.  HENT:  Head: Normocephalic and atraumatic.  Eyes: EOM are normal.  Neck: Normal range of motion.  Respiratory: Effort normal.  Musculoskeletal: Normal range of motion.  Neurological: She is alert and oriented to person, place, and time.    Review of Systems  Eyes:       Dry eyes  Psychiatric/Behavioral: Positive for depression. Negative for hallucinations, substance abuse and suicidal ideas. The patient has insomnia.   All other systems reviewed and are negative.   Blood pressure 129/85, pulse 85, temperature 98.5 F (36.9 C), temperature source Oral, resp. rate 16, height 5\' 5"  (1.651 m), weight 171 lb (77.6 kg), SpO2 100 %.Body mass index is 28.46 kg/m.  General Appearance: Fairly Groomed  Eye Contact:  Fair  Speech:  Clear and Coherent and Normal Rate  Volume:  Decreased  Mood:  Depressed and Hopeless  Affect:  Depressed and Flat  Thought Process:  Coherent  Orientation:  Full (Time, Place, and Person)  Thought Content:  Logical  Suicidal Thoughts:  No  Homicidal Thoughts:  No  Memory:  Immediate;   Fair Recent;   Fair Remote;   Fair  Judgement:  Fair  Insight:  Fair  Psychomotor Activity:  Decreased  Concentration:  Concentration: Fair and Attention Span: Fair  Recall:  [employer] of Knowledge:   Fair  Language:  Fair  Akathisia:  No  Handed:    AIMS (if indicated):     Assets:  Desire for Improvement Financial Resources/Insurance Housing Social Support  ADL's:  Intact  Cognition:  WNL  Sleep:  Number of Hours: 6     Treatment Plan Summary: Daily contact with patient to assess and evaluate symptoms and progress in treatment and Medication management   MDD: I will increase Prozac 30 mg in the morning. She will take Effexor XR 75 mg by mouth twice a day No significant change in mood since admission  Impulse control disorder (gambling addiction): continue individual psychotherapy.  Anxiety disorder (PTSD and OCD symptoms but does not meet full diagnosis): plan to target symptoms with fluoxetine.   Insomnia/antidepressant augmentation: Continue Seroquel 150 mg by mouth daily at bedtime  IBS: continueprobiotics  HTN: continue tenormin 50 mg.  BP and HR well controlled  DM: continue glipizide 2.5 mg plus supplemental insulin  GERD: continue PPI bid  OSA: Continue to encourage pt to use CPAP.   Narcolepsy: h/o narcolepsy diagnosed in 2015. In the past tried provigil, nuvigil and adderall + provigil. No recent episodes of cataplexy. Does not appear overly sedated this morning. We will consider adding an stimulant if significant sedation noted.  Thrombophlebitis: resolving  Dry eyes: I have ordered eye drops for symptom relief  Vitamin B12 deficiency:  Continue on B12 1000 mg injections qd for about 7 days  Vitamin D deficiency: continue patient on 400 units daily.   Diet: low sodium and carb modified  Precautions q 15 m checks  VS q day  Hospitalization IVC  Labs: TSH WNL, B12 low.   Case manager consult: pt in need of new PCP as she no longer can see her prior doctor due to lack of insurance.    SW will refer her to med management clinic for med assistance  [facility]  Possible d/c early next  week  SW will make contact with pt's mother and daughter   Rainey Pines, MD 07/17/2016, 5:10 PM

## 2016-07-17 NOTE — [facility] Group Notes (Signed)
[facility] Group Notes:  (Nursing/MHT/Case Management/Adjunct)  Date:  07/17/2016  Time:  9:46 PM  Type of Therapy:  Evening Wrap-up Group  Participation Level:  Active  Participation Quality:  Appropriate and Attentive  Affect:  Appropriate  Cognitive:  Alert and Appropriate  Insight:  Appropriate and Good  Engagement in Group:  Developing/Improving and Engaged  Modes of Intervention:  Discussion  Summary of Progress/Problems: Patient states that she is improving and her goal was to be out of her room more; which she met.  Gionni Vaca Nanta Kathlee Barnhardt 07/17/2016, 9:46 PM

## 2016-07-17 NOTE — Progress Notes (Signed)
D:  Affect brighter although rates depression, anxiety and hopelessness as a 8/10.  Denies SI/HI/AVH.  Verbalized that her goal is to stay out of room more.  Also verbalized her concerns for her safety.  Stated that she felt as if though there are some patients that should not be mixed in with those that are depressed.  And felt as though the patients that are disruptive to the milieu makes it difficult for the others to progress.  Patient has been up to dayroom working puzzles and coloring with peers medication and group compliant.  Support and encouragement offered.  Safety maintained.

## 2016-07-17 NOTE — B[facility] Group Notes (Signed)
LCSW Group Therapy  Saturday 10/14/20107 Participation: Active Therapeutic goals: Group discussed relationship between thoughts, feelings, and behavior.  Group members asked to identify how these were relevant to their admission into the hospital and look at opportunities to change patterns of thinking/behavior.  Reaction: Pt able to meet above therapeutic goals. Christine Cook, MSW, LCSW

## 2016-07-18 LAB — GLUCOSE, CAPILLARY
Glucose-Capillary: 132 mg/dL — ABNORMAL HIGH (ref 65–99)
Glucose-Capillary: 121 mg/dL — ABNORMAL HIGH (ref 65–99)
Glucose-Capillary: 193 mg/dL — ABNORMAL HIGH (ref 65–99)
Glucose-Capillary: 174 mg/dL — ABNORMAL HIGH (ref 65–99)
Glucose-Capillary: 170 mg/dL — ABNORMAL HIGH (ref 65–99)

## 2016-07-18 MED ORDER — IBUPROFEN 600 MG PO TABS
600.0000 mg | ORAL_TABLET | Freq: Four times a day (QID) | ORAL | Status: DC | PRN
  Administered 2016-07-18: 600 mg via ORAL
  Filled 2016-07-18: qty 1

## 2016-07-18 NOTE — [facility] Group Notes (Signed)
[facility] LCSW Group Therapy  07/18/2016 2:34 PM  Type of Therapy:  Group Therapy  Participation Level:  Did Not Attend  Modes of Intervention:  Activity, Discussion, Education, Socialization and Support  Summary of Progress/Problems: Balance in life: Patients will discuss the concept of balance and how it looks and feels to be unbalanced. Pt will identify areas in their life that is unbalanced and ways to become more balanced.   [place] MSW, [place]  07/18/2016, 2:34 PM

## 2016-07-18 NOTE — Progress Notes (Signed)
Pt refused CPAP. Spoke with Brayton Layman, RN she verified with pt that she doesn't wish to wear CPAP tonight.

## 2016-07-18 NOTE — Progress Notes (Signed)
[facility] MD Progress Note  07/18/2016 10:48 PM Christine Cook  MRN:  [id number] Subjective:  40 y/o  Female  admitted on  after overdosing on alprazolam (50 tabs). Pt with a h/o depression and gambling addiction. Currently dealing with legal charges and lost her job on 10/5.   During my interview patient reported that she has been feeling well. She reported that the medications are helping her and she does not want to have her medications adjusted. She reported that now she is feeling safe at the people and the patient's eating better. She does not have any side effects to the medication. She stated that the medications is improving her mood at this time. She is no longer having suicidal thoughts but feels very nervous and doesn't trust herself.  She is afraid of having to do with all of her problems once discharged. The patient is facing felony charges for stealing, she also lost her job and is being forced to move back with her parents.   Denies homicidal ideation, auditory or visual hallucinations. Patient denies any physical complaints today.   She wants her medications to be adjusted as she reported that she did well on the Prozac and wants to decrease the dose of the Effexor. We discussed about the medication and she agreed with the plan.   Safety maintained with every 15 minute checks. Medications administered as ordered. Support and encouragement provided. Will continue to monitor.   Principal Problem: MDD (major depressive disorder) Diagnosis:   Patient Active Problem List   Diagnosis Date Noted  . OSA (obstructive sleep apnea) [G47.33] 07/12/2016  . 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 Vitamin B12 deficiency [E53.8] 07/12/2016  . Vitamin D deficiency [E55.9] 07/12/2016  . Diabetes ([place]) [E11.9] 07/11/2016  . MDD (major depressive disorder) [F32.9] 07/11/2016  . Impulse control disorder (gambling) [F63.9] 07/11/2016  . Hypertension [I10] 07/09/2016  . Benzodiazepine overdose  [T42.4X1A] 07/09/2016  . GERD [K21.9] 01/21/2009   Total Time spent with patient: 30 minutes  Past Psychiatric History:   Past Medical History:  Past Medical History:  Diagnosis Date  . Anxiety   . Depression   . Diabetes ([place])   . Fatty liver disease, nonalcoholic   . Heart murmur   . Heart palpitations   . High cholesterol   . 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 Hypertension   . Irritable bowel syndrome with diarrhea   . 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 Migraine   . 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 Sleep apnea with hypersomnolence    CPAP study 11-28-09,  10-02-2009 AHI was 16.5, pressure to   . Tachycardia     Past Surgical History:  Procedure Laterality Date  . ADENOIDECTOMY    . BLADDER SURGERY    . CHOLECYSTECTOMY    . KNEE SURGERY     Left knee x 2   . TONSILLECTOMY     Family History:  Family History  Problem Relation Age of Onset  . Asthma Mother   . Hypertension Father   . Coronary artery disease Father   . Diabetes Father   . Asthma Daughter   . Colon cancer Neg Hx    Family Psychiatric  History:   Social History:  History  Alcohol Use No     History  Drug Use No    Social History   Social History  . Marital status: Single    Spouse name: N/A  . Number of children: 1  .  Years of education: College   Occupational History  . Support specialist   .  [employer]   Social History Main Topics  . Smoking status: Former Smoker    Packs/day: 0.50    Years: 2.00    Types: Cigarettes    Quit date: 02/14/1995  . Smokeless tobacco: Never Used  . Alcohol use No  . Drug use: No  . Sexual activity: Not Asked   Other Topics Concern  . None   Social History Narrative   Patient is single and lives alone.   Patient works at [employer].   Patient has a college education.   Patient has one child.   Patient is right-handed.   Patient drinks two 20 oz of Coke daily.   Additional Social History:     Sleep:  Poor  Appetite:  Good  Current Medications: Current Facility-Administered Medications  Medication Dose Route Frequency Provider Last Rate Last Dose  . acetaminophen (TYLENOL) tablet 650 mg  650 mg Oral Q6H PRN Hildred Priest, MD   650 mg at 07/15/16 2223  . acidophilus (RISAQUAD) capsule 1 capsule  1 capsule Oral Daily Hildred Priest, MD   1 capsule at 07/18/16 [id number]  . alum & mag hydroxide-simeth (MAALOX/MYLANTA) 200-200-20 MG/5ML suspension 30 mL  30 mL Oral Q4H PRN Hildred Priest, MD   30 mL at 07/13/16 1639  . atenolol (TENORMIN) tablet 50 mg  50 mg Oral QHS Hildred Priest, MD   50 mg at 07/18/16 2135  . cholecalciferol (VITAMIN D) tablet 400 Units  400 Units Oral Daily Hildred Priest, MD   400 Units at 07/18/16 0831  . FLUoxetine (PROZAC) capsule 30 mg  30 mg Oral q morning - 10a Rainey Pines, MD   30 mg at 07/18/16 0832  . glipiZIDE (GLUCOTROL XL) 24 hr tablet 2.5 mg  2.5 mg Oral Q breakfast Hildred Priest, MD   2.5 mg at 07/18/16 0831  . ibuprofen (ADVIL,MOTRIN) tablet 600 mg  600 mg Oral Q6H PRN Rainey Pines, MD   600 mg at 07/18/16 1217  . insulin aspart (novoLOG) injection 0-5 Units  0-5 Units Subcutaneous QHS Hildred Priest, MD      . insulin aspart (novoLOG) injection 0-9 Units  0-9 Units Subcutaneous TID WC Hildred Priest, MD   2 Units at 07/18/16 1719  . magnesium hydroxide (MILK OF MAGNESIA) suspension 30 mL  30 mL Oral Daily PRN Hildred Priest, MD      . menthol-cetylpyridinium (CEPACOL) lozenge 3 mg  1 lozenge Oral PRN Hildred Priest, MD   3 mg at 07/13/16 0645  . nicotine (NICODERM CQ - dosed in mg/24 hours) patch 21 mg  21 mg Transdermal Daily Hildred Priest, MD   21 mg at 07/18/16 [id number]  . pantoprazole (PROTONIX) EC tablet 40 mg  40 mg Oral BID Rainey Pines, MD   40 mg at 07/18/16 1721  . QUEtiapine (SEROQUEL) tablet 150 mg  150 mg Oral QHS Hildred Priest, MD   150 mg at 07/18/16 2134  . venlafaxine [facility]) tablet 75 mg  75 mg Oral BID WC Rainey Pines, MD   75 mg at 07/18/16 1722    Lab Results:  Results for orders placed or performed during the hospital encounter of 07/11/16 (from the past 48 hour(s))  Glucose, capillary     Status: Abnormal   Collection Time: 07/17/16  6:33 AM  Result Value Ref Range   Glucose-Capillary 155 (H) 65 - 99 mg/dL   Comment 1 Notify RN  Glucose, capillary     Status: Abnormal   Collection Time: 07/17/16 11:45 AM  Result Value Ref Range   Glucose-Capillary 225 (H) 65 - 99 mg/dL   Comment 1 Notify RN    Comment 2 Document in Chart   Glucose, capillary     Status: Abnormal   Collection Time: 07/17/16  4:42 PM  Result Value Ref Range   Glucose-Capillary 109 (H) 65 - 99 mg/dL   Comment 1 Document in Chart   Glucose, capillary     Status: Abnormal   Collection Time: 07/17/16  8:39 PM  Result Value Ref Range   Glucose-Capillary 195 (H) 65 - 99 mg/dL   Comment 1 Notify RN   Glucose, capillary     Status: Abnormal   Collection Time: 07/18/16  6:38 AM  Result Value Ref Range   Glucose-Capillary 121 (H) 65 - 99 mg/dL   Comment 1 Notify RN    Comment 2 Document in Chart   Glucose, capillary     Status: Abnormal   Collection Time: 07/18/16 11:49 AM  Result Value Ref Range   Glucose-Capillary 193 (H) 65 - 99 mg/dL  Glucose, capillary     Status: Abnormal   Collection Time: 07/18/16  4:11 PM  Result Value Ref Range   Glucose-Capillary 174 (H) 65 - 99 mg/dL  Glucose, capillary     Status: Abnormal   Collection Time: 07/18/16  8:18 PM  Result Value Ref Range   Glucose-Capillary 170 (H) 65 - 99 mg/dL    Blood Alcohol level:  No results found for: [facility]  Metabolic Disorder Labs: Lab Results  Component Value Date   HGBA1C 8.3 (H) 07/09/2016   MPG 192 07/09/2016   MPG 143 12/04/2008   No results found for: PROLACTIN No results found for: CHOL, TRIG, HDL, CHOLHDL, VLDL,  LDLCALC  Physical Findings: AIMS:  , ,  ,  ,    CIWA:    COWS:     Musculoskeletal: Strength & Muscle Tone: within normal limits Gait & Station: normal Patient leans: N/A  Psychiatric Specialty Exam: Physical Exam  Constitutional: She is oriented to person, place, and time. She appears well-developed and well-nourished.  HENT:  Head: Normocephalic and atraumatic.  Eyes: EOM are normal.  Neck: Normal range of motion.  Respiratory: Effort normal.  Musculoskeletal: Normal range of motion.  Neurological: She is alert and oriented to person, place, and time.    Review of Systems  Eyes:       Dry eyes  Psychiatric/Behavioral: Positive for depression. Negative for hallucinations, substance abuse and suicidal ideas. The patient has insomnia.   All other systems reviewed and are negative.   Blood pressure 125/75, pulse 81, temperature 98 F (36.7 C), temperature source Oral, resp. rate 18, height 5\' 5"  (1.651 m), weight 171 lb (77.6 kg), SpO2 100 %.Body mass index is 28.46 kg/m.  General Appearance: Fairly Groomed  Eye Contact:  Fair  Speech:  Clear and Coherent and Normal Rate  Volume:  Decreased  Mood:  Depressed and Hopeless  Affect:  Depressed and Flat  Thought Process:  Coherent  Orientation:  Full (Time, Place, and Person)  Thought Content:  Logical  Suicidal Thoughts:  No  Homicidal Thoughts:  No  Memory:  Immediate;   Fair Recent;   Fair Remote;   Fair  Judgement:  Fair  Insight:  Fair  Psychomotor Activity:  Decreased  Concentration:  Concentration: Fair and Attention Span: Fair  Recall:  [employer] of Knowledge:  Fair  Language:  Fair  Akathisia:  No  Handed:    AIMS (if indicated):     Assets:  Desire for Improvement Financial Resources/Insurance Housing Social Support  ADL's:  Intact  Cognition:  WNL  Sleep:  Number of Hours: 6.25     Treatment Plan Summary: Daily contact with patient to assess and evaluate symptoms and progress in treatment and  Medication management   MDD: I will increase Prozac 30 mg in the morning. She will take Effexor XR 75 mg by mouth twice a day No significant change in mood since admission  Impulse control disorder (gambling addiction): continue individual psychotherapy.  Anxiety disorder (PTSD and OCD symptoms but does not meet full diagnosis): plan to target symptoms with fluoxetine.   Insomnia/antidepressant augmentation: Continue Seroquel 150 mg by mouth daily at bedtime  IBS: continueprobiotics  HTN: continue tenormin 50 mg.  BP and HR well controlled  DM: continue glipizide 2.5 mg plus supplemental insulin  GERD: continue PPI bid  OSA: Continue to encourage pt to use CPAP.   Narcolepsy: h/o narcolepsy diagnosed in 2015. In the past tried provigil, nuvigil and adderall + provigil. No recent episodes of cataplexy. Does not appear overly sedated this morning. We will consider adding an stimulant if significant sedation noted.  Thrombophlebitis: resolving  Dry eyes: I have ordered eye drops for symptom relief  Vitamin B12 deficiency:  Continue on B12 1000 mg injections qd for about 7 days  Vitamin D deficiency: continue patient on 400 units daily.   Diet: low sodium and carb modified  Precautions q 15 m checks  VS q day  Hospitalization IVC  Labs: TSH WNL, B12 low.   Case manager consult: pt in need of new PCP as she no longer can see her prior doctor due to lack of insurance.    SW will refer her to med management clinic for med assistance  [facility]  Possible d/c early next week  SW will make contact with pt's mother and daughter   Rainey Pines, MD 07/18/2016, 10:48 PM

## 2016-07-18 NOTE — Progress Notes (Signed)
Christine Cook pleasant on approach. She had a good visit with daughter during the evening and she was visible in the milieu for most of the shift. She appeared to rest well through out the night with no issues to report on shift at this time.

## 2016-07-18 NOTE — [facility] Group Notes (Signed)
[facility] Group Notes:  (Nursing/MHT/Case Management/Adjunct)  Date:  07/18/2016  Time:  9:42 PM  Type of Therapy:  Group Therapy  Participation Level:  Active  Participation Quality:  Appropriate  Affect:  Appropriate  Cognitive:  Appropriate  Insight:  Appropriate  Engagement in Group:  Engaged  Modes of Intervention:  Discussion  Summary of Progress/Problems:  Christine Cook 07/18/2016, 9:42 PM

## 2016-07-18 NOTE — Progress Notes (Signed)
Pt has been refuses CPAP. CPAP unit removed from [facility] floor.

## 2016-07-19 LAB — GLUCOSE, CAPILLARY
Glucose-Capillary: 122 mg/dL — ABNORMAL HIGH (ref 65–99)
Glucose-Capillary: 214 mg/dL — ABNORMAL HIGH (ref 65–99)
Glucose-Capillary: 111 mg/dL — ABNORMAL HIGH (ref 65–99)
Glucose-Capillary: 92 mg/dL (ref 65–99)

## 2016-07-19 MED ORDER — FLUOXETINE HCL 40 MG PO CAPS: 40.0000 mg | ORAL_CAPSULE | Freq: Every day | ORAL | 0 refills | Status: DC

## 2016-07-19 MED ORDER — PANTOPRAZOLE SODIUM 40 MG PO TBEC: 40.0000 mg | DELAYED_RELEASE_TABLET | Freq: Two times a day (BID) | ORAL | 0 refills | Status: DC

## 2016-07-19 MED ORDER — ATENOLOL 50 MG PO TABS: 50.0000 mg | ORAL_TABLET | Freq: Every day | ORAL | 0 refills | Status: DC

## 2016-07-19 MED ORDER — VITAMIN D3 10 MCG (400 UNIT) PO TABS: 400.0000 [IU] | ORAL_TABLET | Freq: Every day | ORAL | 0 refills | Status: DC

## 2016-07-19 MED ORDER — KETOTIFEN FUMARATE 0.025 % OP SOLN
1.0000 [drp] | Freq: Two times a day (BID) | OPHTHALMIC | Status: DC
  Administered 2016-07-19 – 2016-07-20 (×2): 1 [drp] via OPHTHALMIC
  Filled 2016-07-19: qty 5

## 2016-07-19 MED ORDER — VENLAFAXINE HCL 75 MG PO TABS: 75.0000 mg | ORAL_TABLET | Freq: Two times a day (BID) | ORAL | 0 refills | Status: DC

## 2016-07-19 MED ORDER — QUETIAPINE FUMARATE 50 MG PO TABS: 150.0000 mg | ORAL_TABLET | Freq: Every day | ORAL | 0 refills | Status: DC

## 2016-07-19 MED ORDER — VITAMIN B-12 1000 MCG PO TABS
1000.0000 ug | ORAL_TABLET | Freq: Every day | ORAL | Status: DC
  Administered 2016-07-19 – 2016-07-20 (×2): 1000 ug via ORAL
  Filled 2016-07-19 (×2): qty 1

## 2016-07-19 MED ORDER — CYANOCOBALAMIN 1000 MCG PO TABS: 1000.0000 ug | ORAL_TABLET | Freq: Every day | ORAL | Status: DC

## 2016-07-19 MED ORDER — GLIPIZIDE ER 2.5 MG PO TB24: 2.5000 mg | ORAL_TABLET | Freq: Every day | ORAL | 0 refills | Status: DC

## 2016-07-19 NOTE — [facility] Group Notes (Signed)
[facility] Group Notes:  (Nursing/MHT/Case Management/Adjunct)  Date:  07/19/2016  Time:  3:56 PM  Type of Therapy:  Psychoeducational Skills  Participation Level:  Did Not Attend    Drake Leach 07/19/2016, 3:56 PM

## 2016-07-19 NOTE — Progress Notes (Signed)
Pt awake, alert, up on unit today. Appropriately interacts with staff/peers. Denies SI/HI/AVH. Reports fair sleep last night with help from medications. Fair appetite, low energy, poor concentration. Rates depression 7/10, hopelessness 7/10 and anxiety 7/10 (low 0-10 high). Bright, pleasant, cooperative today. Medication complaint.   Safety maintained with every 15 minute checks. Medications administered as ordered with education provided. Support and encouragement provided. Will continue to monitor.

## 2016-07-19 NOTE — Plan of Care (Signed)
Problem: Coping: Goal: Ability to cope will improve Outcome: Progressing Pt discusses taking things "one day at a time" and focusing on herself after d/c.   Problem: Health Behavior/Discharge Planning: Goal: Compliance with therapeutic regimen will improve Outcome: Progressing Pt attending groups and taking medications as prescribed.

## 2016-07-19 NOTE — Progress Notes (Signed)
[facility] MD Progress Note  07/19/2016 2:37 PM Christine Cook  MRN:  [id number] Subjective:  40 y/o  Female  admitted on  after overdosing on alprazolam (50 tabs). Pt with a h/o depression and gambling addiction. Currently dealing with legal charges and lost her job on 10/5.    Patient reports tolerating well the changes in medications. Over the weekend the Effexor was further reduced and she is now on 150 mg. The Prozac was also increased to 30 mg. She feels that her mood is improved. She feels very anxious about plans for discharge tomorrow. She is hopeful and no longer is having thoughts of suicide. She is sleeping better. Denies any thoughts of wanting to harm others or having auditory or visual hallucinations. Denies any physical complaints. Attention and concentration and appetite have been good.  Per nursing: Pt awake, alert, up on unit today. Appropriately interacts with staff/peers. Denies SI/HI/AVH. Reports fair sleep last night with help from medications. Fair appetite, low energy, poor concentration. Rates depression 7/10, hopelessness 7/10 and anxiety 7/10 (low 0-10 high). Bright, pleasant, cooperative today. Medication complaint.   Safety maintained with every 15 minute checks. Medications administered as ordered with education provided. Support and encouragement provided. Will continue to monitor.   Principal Problem: MDD (major depressive disorder) Diagnosis:   Patient Active Problem List   Diagnosis Date Noted  . OSA (obstructive sleep apnea) [G47.33] 07/12/2016  . 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 Vitamin B12 deficiency [E53.8] 07/12/2016  . Vitamin D deficiency [E55.9] 07/12/2016  . Diabetes ([place]) [E11.9] 07/11/2016  . MDD (major depressive disorder) [F32.9] 07/11/2016  . Impulse control disorder (gambling) [F63.9] 07/11/2016  . Hypertension [I10] 07/09/2016  . Benzodiazepine overdose [T42.4X1A] 07/09/2016  . GERD [K21.9] 01/21/2009   Total Time spent with patient: 30  minutes  Past Psychiatric History:   Past Medical History:  Past Medical History:  Diagnosis Date  . Anxiety   . Depression   . Diabetes ([place])   . Fatty liver disease, nonalcoholic   . Heart murmur   . Heart palpitations   . High cholesterol   . 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 Hypertension   . Irritable bowel syndrome with diarrhea   . 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 Migraine   . 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 Sleep apnea with hypersomnolence    CPAP study 11-28-09,  10-02-2009 AHI was 16.5, pressure to   . Tachycardia     Past Surgical History:  Procedure Laterality Date  . ADENOIDECTOMY    . BLADDER SURGERY    . CHOLECYSTECTOMY    . KNEE SURGERY     Left knee x 2   . TONSILLECTOMY     Family History:  Family History  Problem Relation Age of Onset  . Asthma Mother   . Hypertension Father   . Coronary artery disease Father   . Diabetes Father   . Asthma Daughter   . Colon cancer Neg Hx    Family Psychiatric  History:   Social History:  History  Alcohol Use No     History  Drug Use No    Social History   Social History  . Marital status: Single    Spouse name: N/A  . Number of children: 1  . Years of education: College   Occupational History  . Support specialist   .  [employer]   Social History Main Topics  . Smoking status: Former Smoker  Packs/day: 0.50    Years: 2.00    Types: Cigarettes    Quit date: 02/14/1995  . Smokeless tobacco: Never Used  . Alcohol use No  . Drug use: No  . Sexual activity: Not Asked   Other Topics Concern  . None   Social History Narrative   Patient is single and lives alone.   Patient works at [employer].   Patient has a college education.   Patient has one child.   Patient is right-handed.   Patient drinks two 20 oz of Coke daily.   Additional Social History:      Current Medications: Current Facility-Administered Medications  Medication Dose Route  Frequency Provider Last Rate Last Dose  . acetaminophen (TYLENOL) tablet 650 mg  650 mg Oral Q6H PRN Hildred Priest, MD   650 mg at 07/15/16 2223  . acidophilus (RISAQUAD) capsule 1 capsule  1 capsule Oral Daily Hildred Priest, MD   1 capsule at 07/19/16 0836  . alum & mag hydroxide-simeth (MAALOX/MYLANTA) 200-200-20 MG/5ML suspension 30 mL  30 mL Oral Q4H PRN Hildred Priest, MD   30 mL at 07/13/16 1639  . atenolol (TENORMIN) tablet 50 mg  50 mg Oral QHS Hildred Priest, MD   50 mg at 07/18/16 2135  . cholecalciferol (VITAMIN D) tablet 400 Units  400 Units Oral Daily Hildred Priest, MD   400 Units at 07/19/16 [id number]  . FLUoxetine (PROZAC) capsule 30 mg  30 mg Oral q morning - 10a Rainey Pines, MD   30 mg at 07/19/16 0835  . glipiZIDE (GLUCOTROL XL) 24 hr tablet 2.5 mg  2.5 mg Oral Q breakfast Hildred Priest, MD   2.5 mg at 07/19/16 0835  . ibuprofen (ADVIL,MOTRIN) tablet 600 mg  600 mg Oral Q6H PRN Rainey Pines, MD   600 mg at 07/18/16 1217  . insulin aspart (novoLOG) injection 0-5 Units  0-5 Units Subcutaneous QHS Hildred Priest, MD      . insulin aspart (novoLOG) injection 0-9 Units  0-9 Units Subcutaneous TID WC Hildred Priest, MD   3 Units at 07/19/16 1135  . magnesium hydroxide (MILK OF MAGNESIA) suspension 30 mL  30 mL Oral Daily PRN Hildred Priest, MD      . menthol-cetylpyridinium (CEPACOL) lozenge 3 mg  1 lozenge Oral PRN Hildred Priest, MD   3 mg at 07/13/16 0645  . nicotine (NICODERM CQ - dosed in mg/24 hours) patch 21 mg  21 mg Transdermal Daily Hildred Priest, MD   21 mg at 07/19/16 [id number]  . pantoprazole (PROTONIX) EC tablet 40 mg  40 mg Oral BID Rainey Pines, MD   40 mg at 07/19/16 0836  . QUEtiapine (SEROQUEL) tablet 150 mg  150 mg Oral QHS Hildred Priest, MD   150 mg at 07/18/16 2134  . venlafaxine [facility]) tablet 75 mg  75 mg Oral BID WC Rainey Pines, MD   75  mg at 07/19/16 [id number]    Lab Results:  Results for orders placed or performed during the hospital encounter of 07/11/16 (from the past 48 hour(s))  Glucose, capillary     Status: Abnormal   Collection Time: 07/17/16  4:42 PM  Result Value Ref Range   Glucose-Capillary 109 (H) 65 - 99 mg/dL   Comment 1 Document in Chart   Glucose, capillary     Status: Abnormal   Collection Time: 07/17/16  8:39 PM  Result Value Ref Range   Glucose-Capillary 195 (H) 65 - 99 mg/dL   Comment 1 Notify RN  Glucose, capillary     Status: Abnormal   Collection Time: 07/18/16  6:38 AM  Result Value Ref Range   Glucose-Capillary 121 (H) 65 - 99 mg/dL   Comment 1 Notify RN    Comment 2 Document in Chart   Glucose, capillary     Status: Abnormal   Collection Time: 07/18/16 11:49 AM  Result Value Ref Range   Glucose-Capillary 193 (H) 65 - 99 mg/dL  Glucose, capillary     Status: Abnormal   Collection Time: 07/18/16  4:11 PM  Result Value Ref Range   Glucose-Capillary 174 (H) 65 - 99 mg/dL  Glucose, capillary     Status: Abnormal   Collection Time: 07/18/16  8:18 PM  Result Value Ref Range   Glucose-Capillary 170 (H) 65 - 99 mg/dL  Glucose, capillary     Status: Abnormal   Collection Time: 07/19/16  6:34 AM  Result Value Ref Range   Glucose-Capillary 122 (H) 65 - 99 mg/dL  Glucose, capillary     Status: Abnormal   Collection Time: 07/19/16 11:34 AM  Result Value Ref Range   Glucose-Capillary 214 (H) 65 - 99 mg/dL    Blood Alcohol level:  No results found for: [facility]  Metabolic Disorder Labs: Lab Results  Component Value Date   HGBA1C 8.3 (H) 07/09/2016   MPG 192 07/09/2016   MPG 143 12/04/2008   No results found for: PROLACTIN No results found for: CHOL, TRIG, HDL, CHOLHDL, VLDL, LDLCALC  Physical Findings: AIMS:  , ,  ,  ,    CIWA:    COWS:     Musculoskeletal: Strength & Muscle Tone: within normal limits Gait & Station: normal Patient leans: N/A  Psychiatric Specialty Exam: Physical  Exam  Constitutional: She is oriented to person, place, and time. She appears well-developed and well-nourished.  HENT:  Head: Normocephalic and atraumatic.  Eyes: EOM are normal.  Neck: Normal range of motion.  Respiratory: Effort normal.  Musculoskeletal: Normal range of motion.  Neurological: She is alert and oriented to person, place, and time.    Review of Systems  Eyes:       Dry eyes  Psychiatric/Behavioral: Positive for depression. Negative for hallucinations, substance abuse and suicidal ideas. The patient has insomnia.   All other systems reviewed and are negative.   Blood pressure 112/73, pulse 78, temperature 98.2 F (36.8 C), temperature source Oral, resp. rate 18, height 5\' 5"  (1.651 m), weight 77.6 kg (171 lb), SpO2 100 %.Body mass index is 28.46 kg/m.  General Appearance: Fairly Groomed  Eye Contact:  Fair  Speech:  Clear and Coherent and Normal Rate  Volume:  Decreased  Mood:  Depressed and Hopeless  Affect:  Depressed and Flat  Thought Process:  Coherent  Orientation:  Full (Time, Place, and Person)  Thought Content:  Logical  Suicidal Thoughts:  No  Homicidal Thoughts:  No  Memory:  Immediate;   Fair Recent;   Fair Remote;   Fair  Judgement:  Fair  Insight:  Fair  Psychomotor Activity:  Decreased  Concentration:  Concentration: Fair and Attention Span: Fair  Recall:  [employer] of Knowledge:  Fair  Language:  Fair  Akathisia:  No  Handed:    AIMS (if indicated):     Assets:  Desire for Improvement Financial Resources/Insurance Housing Social Support  ADL's:  Intact  Cognition:  WNL  Sleep:  Number of Hours: 6.3     Treatment Plan Summary: Daily contact with patient to assess and  evaluate symptoms and progress in treatment and Medication management   MDD: Patient will be continued on fluoxetine 30 mg a day and Effexor 75 mg by mouth twice a day. For antidepressant augmentation the Seroquel has been added and is currently at 150 mg by mouth  daily at bedtime. The plan will be for the Effexor to be a slowly taper as this medication has failed to benefit the patient. Patient took Effexor XR 300 mg for longer than 20 years.  Impulse control disorder (gambling addiction): continue individual psychotherapy--we will arrange for the patient to continue psychotherapy at [facility]  Anxiety disorder (PTSD and OCD symptoms but does not meet full diagnosis): plan to target symptoms with fluoxetine.   Insomnia/antidepressant augmentation: Continue Seroquel 150 mg by mouth daily at bedtime  IBS: continueprobiotics  HTN: continue tenormin 50 mg.  BP and HR well controlled  DM: continue glipizide 2.5 mg plus supplemental insulin  GERD: continue PPI bid  OSA: Continue to encourage pt to use CPAP.   Narcolepsy: h/o narcolepsy diagnosed in 2015. In the past tried provigil, nuvigil and adderall + provigil. No recent episodes of cataplexy. Does not appear overly sedated this morning. We will consider adding an stimulant if significant sedation noted.  Thrombophlebitis: resolved  Dry eyes: I have ordered eye drops for symptom relief  Vitamin B12 deficiency:  Continue on B12 1000 mg injections qd for about 7 days  Vitamin D deficiency: continue patient on 400 units daily.   Tobacco use disorder: Continue nicotine patches 21 mg a day. Patient was to continue this patch upon discharge.  Diet: low sodium and carb modified  Precautions q 15 m checks  VS q day  Hospitalization IVC  Labs: TSH WNL, B12 low.   Case manager consult: pt in need of new PCP as she no longer can see her prior doctor due to lack of insurance.    SW will refer her to med management clinic for med assistance  F[facility]  Plan to discharge tomorrow     Hildred Priest, MD 07/19/2016, 2:37 PM

## 2016-07-19 NOTE — Progress Notes (Signed)
Recreation Therapy Notes  Date: 10.16.17 Time: 1:00 pm Location: Craft Room  Group Topic: Wellness  Goal Area(s) Addresses:  Patient will identify at least one item per dimension of health. Patient will examine areas they are deficient in.  Behavioral Response: Attentive, Interactive  Intervention: 6 Dimensions of Health  Activity: Patients were given a definition sheet with the 6 Dimensions of Health. Patients were also given a worksheet with each dimension listed on it and were instructed to write 2-3 things they were currently doing in each dimension.  Education: LRT educated patients on ways in to increase each dimension.  Education Outcome: Acknowledges education/In group clarification offered   Clinical Observations/Feedback: Patient completed activity by writing at least two items in each dimension. Patient contributed to group discussion by stating what areas she was lacking in, how she can improve certain areas, how this activity relates to her d/c, and what would change for her if she was more aware of her wellness.  Leonette Monarch, LRT/CTRS 07/19/2016 2:02 PM

## 2016-07-19 NOTE — BHH Group Notes (Signed)
[place] Group Notes:  (Nursing/MHT/Case Management/Adjunct)  Date:  07/19/2016  Time:  9:58 PM  Type of Therapy:  Evening Wrap-up Group  Participation Level:  Active  Participation Quality:  Appropriate and Attentive  Affect:  Appropriate  Cognitive:  Alert and Appropriate  Insight:  Appropriate, Good and Improving  Engagement in Group:  Developing/Improving and Engaged  Modes of Intervention:  Discussion  Summary of Progress/Problems:  Christine Cook 07/19/2016, 9:58 PM

## 2016-07-19 NOTE — Plan of Care (Signed)
Problem: Coping: Goal: Ability to cope will improve Outcome: Progressing Denies SI/HI/AVH. Voices feelings. Receptive to treatment.

## 2016-07-19 NOTE — Progress Notes (Signed)
D: Pt is pleasant and cooperative this evening. She reports that she is excited for discharge and also anxious about leaving. She rates anxiety and depression 5/10 at this time. Pt discusses that she plans to take things "one day at a time" after discharge and focus on herself. Denies SI/HI/AVH at this time. A: Emotional support and encouragement provided. Medications administered with education. q15 minute safety checks maintained. R: Pt remains free from harm. Will continue to monitor.

## 2016-07-19 NOTE — BHH Group Notes (Signed)
[place] LCSW Group Therapy   07/19/2016 1pm Type of Therapy: Group Therapy   Participation Level: Active   Participation Quality: Attentive, Sharing and Supportive   Affect: Depressed and Flat   Cognitive: Alert and Oriented   Insight: Developing/Improving and Engaged   Engagement in Therapy: Developing/Improving and Engaged   Modes of Intervention: Clarification, Confrontation, Discussion, Education, Exploration,  Limit-setting, Orientation, Problem-solving, Rapport Building, Art therapist, Socialization and Support   Summary of Progress/Problems: Pt identified obstacles faced currently and processed barriers involved in overcoming these obstacles. Pt identified steps necessary for overcoming these obstacles and explored motivation (internal and external) for facing these difficulties head on. Pt further identified one area of concern in their lives and chose a goal to focus on for today. Pt identified obtaining employment as the pt's primary goal.  Pt also shared that in order to achieve the pt's goal the pt plans to seek assistance with the pt's addiction to gambling and with medication management.  Pt shared an obstacle to the pt's goal is negative people and that the pt's motivation to achieve the pt's goal is proving that the pt is not a bad person.  Alphonse Guild. Terryn Rosenkranz, LCSWA, LCAS

## 2016-07-19 NOTE — Progress Notes (Addendum)
Pt denies SI/HI/AVH. Denies pain. Affect is slightly anxious but pleasant and assertive during interaction. Attended evening group. Visible in milieu interacting appropriately with staff and peers. Medication compliant. Pt continues to refuse CPAP.  Safety maintained. Will continue to monitor.

## 2016-07-20 ENCOUNTER — Telehealth: Payer: Self-pay | Admitting: Nurse Practitioner

## 2016-07-20 LAB — LIPID PANEL
Cholesterol: 260 mg/dL — ABNORMAL HIGH (ref 0–200)
HDL: 33 mg/dL — ABNORMAL LOW (ref >40)
LDL Cholesterol: UNDETERMINED mg/dL (ref 0–99)
Total CHOL/HDL Ratio: 7.9 RATIO
Triglycerides: 610 mg/dL — ABNORMAL HIGH (ref <150)
VLDL: UNDETERMINED mg/dL (ref 0–40)

## 2016-07-20 LAB — GLUCOSE, CAPILLARY: Glucose-Capillary: 134 mg/dL — ABNORMAL HIGH (ref 65–99)

## 2016-07-20 MED ORDER — ATORVASTATIN CALCIUM 20 MG PO TABS: 20.0000 mg | ORAL_TABLET | Freq: Every day | ORAL | 0 refills | Status: DC

## 2016-07-20 NOTE — Progress Notes (Signed)
D:Patient aware of discharge this shift . Patient returning home . Patient received all belonging locked up . Patient denies  Suicidal  And homicidal ideations  .  A: Writer instructed on discharge criteria  . Informed of 7 day supply  Of medication  Transitional Record , Suicidal Risk Assessment Discharge summary and prescriptions   . Aware  Of follow up appointment . R: Patient left unit with no questions  Or concerns  With  family

## 2016-07-20 NOTE — Telephone Encounter (Signed)
Patient called in reference to her [facility] application
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
## 2016-07-20 NOTE — Progress Notes (Signed)
Recreation Therapy Notes  INPATIENT RECREATION TR PLAN  Patient Details Name: Christine Cook MRN: [id number] DOB: 01/02/1976 Today's Date: 07/20/2016  Rec Therapy Plan Is patient appropriate for Therapeutic Recreation?: Yes Treatment times per week: At least once a week TR Treatment/Interventions: 1:1 session, Group participation (Comment) (Appropriate participation in daily recreational therapy tx)  Discharge Criteria Pt will be discharged from therapy if:: Treatment goals are met, Discharged Treatment plan/goals/alternatives discussed and agreed upon by:: Patient/family  Discharge Summary Short term goals set: See Care Plan Short term goals met: Complete Progress toward goals comments: One-to-one attended Which groups?: Leisure education, Wellness, Other (Comment) (Self-expression) One-to-one attended: Self-esteem, coping skills Reason goals not met: N/A Therapeutic equipment acquired: None Reason patient discharged from therapy: Discharge from hospital Pt/family agrees with progress & goals achieved: Yes Date patient discharged from therapy: 07/20/16   Leonette Monarch, LRT/CTRS  07/20/2016, 1:19 PM

## 2016-07-20 NOTE — Plan of Care (Signed)
Problem: Coping: Goal: Ability to verbalize feelings will improve Outcome: Progressing Working on coping skills    

## 2016-07-20 NOTE — Telephone Encounter (Signed)
I left the patient a message indicating that she still needed to bring in to [facility] proof of residency, pay stubs/food stamps or a letter of support etc. before we can process her application.
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
## 2016-07-21 NOTE — Progress Notes (Signed)
  [facility] Adult Case Management Discharge Plan :  Will you be returning to the same living situation after discharge:  Yes,  pt will be returning to her home in [place] At discharge, do you have transportation home?: Yes,  pt will be picked up by famly Do you have the ability to pay for your medications: Yes,  pt will be provided with prescriptions at discharge  Release of information consent forms completed and in the chart;  Patient's signature needed at discharge.  Patient to Follow up at: Follow-up [facility]. Go on 07/21/2016.   Why:  Walk in between 9am-4pm.  Initial appointments are first come first serve, arrive early to minimize wait times. Contact information: [address]. [place] [address] [phone] [place]. Go on 07/21/2016.   Why:  go at 8:20 am.  Need to bring proff of income.  Your appoitment is tomorrow Wednesday  Contact information: [place] [address] [phone]           Next level of care provider has access to [place] and Suicide Prevention discussed: Yes,  completed with pt  Have you used any form of tobacco in the last 30 days? (Cigarettes, Smokeless Tobacco, Cigars, and/or Pipes): Yes  Has patient been referred to the Quitline?: Patient refused referral  Patient has been referred for addiction treatment: Pt. refused referral  Alphonse Guild [place] Wolters 07/21/2016, 9:34 AM

## 2016-07-21 NOTE — Tx Team (Signed)
Interdisciplinary Treatment and Diagnostic Plan Update  07/21/2016 *Late Entry Time of Session: 10:30am CLEO STEG MRN: [id number]  Principal Diagnosis: MDD (major depressive disorder)  Secondary Diagnoses: Principal Problem:   MDD (major depressive disorder) Active Problems:   GERD   Hypertension   Benzodiazepine overdose   Diabetes ([place])   Impulse control disorder (gambling)   OSA (obstructive sleep apnea)   Narcolepsy   Vitamin B12 deficiency   Vitamin D deficiency   Current Medications:  No current facility-administered medications for this encounter.    Current Outpatient Prescriptions  Medication Sig Dispense Refill  . atenolol (TENORMIN) 50 MG tablet Take 1 tablet (50 mg total) by mouth at bedtime. 30 tablet 0  . atorvastatin (LIPITOR) 20 MG tablet Take 1 tablet (20 mg total) by mouth daily. 30 tablet 0  . cholecalciferol 400 units tablet Take 1 tablet (400 Units total) by mouth daily. 30 each 0  . FLUoxetine (PROZAC) 40 MG capsule Take 1 capsule (40 mg total) by mouth daily. 30 capsule 0  . glipiZIDE (GLUCOTROL XL) 2.5 MG 24 hr tablet Take 1 tablet (2.5 mg total) by mouth daily with breakfast. 30 tablet 0  . pantoprazole (PROTONIX) 40 MG tablet Take 1 tablet (40 mg total) by mouth 2 (two) times daily. 60 tablet 0  . Probiotic Product (ALIGN) 4 MG CAPS Take 1 capsule by mouth daily.    . QUEtiapine (SEROQUEL) 50 MG tablet Take 3 tablets (150 mg total) by mouth at bedtime. 30 tablet 0  . venlafaxine (EFFEXOR) 75 MG tablet Take 1 tablet (75 mg total) by mouth 2 (two) times daily with a meal. 60 tablet 0  . vitamin B-12 1000 MCG tablet Take 1 tablet (1,000 mcg total) by mouth daily. 30 tablet    PTA Medications: No prescriptions prior to admission.    Patient Stressors: Arts development officer issue  Patient Strengths: Curator fund of knowledge Supportive family/friends  Treatment Modalities: Medication Management, Group therapy,  Case management,  1 to 1 session with clinician, Psychoeducation, Recreational therapy.   Physician Treatment Plan for Primary Diagnosis: MDD (major depressive disorder) Long Term Goal(s): Improvement in symptoms so as ready for discharge Improvement in symptoms so as ready for discharge   Short Term Goals: Ability to identify changes in lifestyle to reduce recurrence of condition will improve Ability to disclose and discuss suicidal ideas Ability to demonstrate self-control will improve Ability to identify and develop effective coping behaviors will improve Compliance with prescribed medications will improve Ability to identify triggers associated with substance abuse/mental health issues will improve Ability to identify changes in lifestyle to reduce recurrence of condition will improve Ability to maintain clinical measurements within normal limits will improve Ability to identify triggers associated with substance abuse/mental health issues will improve  Medication Management: Evaluate patient's response, side effects, and tolerance of medication regimen.  Therapeutic Interventions: 1 to 1 sessions, Unit Group sessions and Medication administration.  Evaluation of Outcomes: Adequate for discharge  Physician Treatment Plan for Secondary Diagnosis: Principal Problem:   MDD (major depressive disorder) Active Problems:   GERD   Hypertension   Benzodiazepine overdose   Diabetes ([place])   Impulse control disorder (gambling)   OSA (obstructive sleep apnea)   Narcolepsy   Vitamin B12 deficiency   Vitamin D deficiency  Long Term Goal(s): Improvement in symptoms so as ready for discharge Improvement in symptoms so as ready for discharge   Short Term Goals: Ability to identify changes in lifestyle to reduce recurrence  of condition will improve Ability to disclose and discuss suicidal ideas Ability to demonstrate self-control will improve Ability to identify and develop effective  coping behaviors will improve Compliance with prescribed medications will improve Ability to identify triggers associated with substance abuse/mental health issues will improve Ability to identify changes in lifestyle to reduce recurrence of condition will improve Ability to maintain clinical measurements within normal limits will improve Ability to identify triggers associated with substance abuse/mental health issues will improve     Medication Management: Evaluate patient's response, side effects, and tolerance of medication regimen.  Therapeutic Interventions: 1 to 1 sessions, Unit Group sessions and Medication administration.  Evaluation of Outcomes: Adequate for discharge   RN Treatment Plan for Primary Diagnosis: MDD (major depressive disorder) Long Term Goal(s): Knowledge of disease and therapeutic regimen to maintain health will improve  Short Term Goals: Ability to verbalize frustration and anger appropriately will improve, Ability to participate in decision making will improve, Ability to verbalize feelings will improve, Ability to identify and develop effective coping behaviors will improve and Compliance with prescribed medications will improve  Medication Management: RN will administer medications as ordered by provider, will assess and evaluate patient's response and provide education to patient for prescribed medication. RN will report any adverse and/or side effects to prescribing provider.  Therapeutic Interventions: 1 on 1 counseling sessions, Psychoeducation, Medication administration, Evaluate responses to treatment, Monitor vital signs and CBGs as ordered, Perform/monitor CIWA, COWS, AIMS and Fall Risk screenings as ordered, Perform wound care treatments as ordered.  Evaluation of Outcomes: Adequate for discharge   LCSW Treatment Plan for Primary Diagnosis: MDD (major depressive disorder) Long Term Goal(s): Safe transition to appropriate next level of care at  discharge, Engage patient in therapeutic group addressing interpersonal concerns.  Short Term Goals: Engage patient in aftercare planning with referrals and resources, Increase ability to appropriately verbalize feelings, Increase emotional regulation, Identify triggers associated with mental health/substance abuse issues and Increase skills for wellness and recovery  Therapeutic Interventions: Assess for all discharge needs, 1 to 1 time with Social worker, Explore available resources and support systems, Assess for adequacy in community support network, Educate family and significant other(s) on suicide prevention, Complete Psychosocial Assessment, Interpersonal group therapy.  Evaluation of Outcomes: Adequate for discharge   Progress in Treatment: Attending groups: Yes. Participating in groups: Yes. Taking medication as prescribed: Yes. Toleration medication: Yes. Family/Significant other contact made: No, will contact:  mother Patient understands diagnosis: Yes. Discussing patient identified problems/goals with staff: Yes. Medical problems stabilized or resolved: Yes. Denies suicidal/homicidal ideation: No.  Issues/concerns per patient self-inventory: No. Other:    New problem(s) identified: No, Describe:     New Short Term/Long Term Goal(s):  Discharge Plan or Barriers:   Reason for Continuation of Hospitalization: Depression Medication stabilization Suicidal ideation  Estimated Length of Stay:5 days  Attendees: Patient:  07/21/2016 9:39 AM  Physician: Merlyn Albert. MD 07/21/2016 9:39 AM  Nursing: Polly Cobia, RN 07/21/2016 9:39 AM  RN Care Manager: 07/21/2016 9:39 AM  Social Worker: Dossie Arbour, LCSW 07/21/2016 9:39 AM  Recreational Therapist: Everitt Amber, LRT 07/21/2016 9:39 AM  Social Worker: Marylou Flesher, [place] 07/21/2016 9:39 AM  Other:  07/21/2016 9:39 AM  Other: 07/21/2016 9:39 AM    Scribe for Treatment Team: *Late Entry [place],  LCSWA 07/21/2016 9:39 AM

## 2016-08-05 ENCOUNTER — Ambulatory Visit: Payer: Self-pay | Admitting: Adult Health Nurse Practitioner

## 2016-08-05 VITALS — BP 114/79 | Ht 65.0 in | Wt 178.0 lb

## 2016-08-05 DIAGNOSIS — I10 Essential (primary) hypertension: Secondary | ICD-10-CM

## 2016-08-05 DIAGNOSIS — E785 Hyperlipidemia, unspecified: Secondary | ICD-10-CM

## 2016-08-05 DIAGNOSIS — K219 Gastro-esophageal reflux disease without esophagitis: Secondary | ICD-10-CM

## 2016-08-05 DIAGNOSIS — Z794 Long term (current) use of insulin: Principal | ICD-10-CM

## 2016-08-05 DIAGNOSIS — E119 Type 2 diabetes mellitus without complications: Secondary | ICD-10-CM

## 2016-08-05 DIAGNOSIS — R000 Tachycardia, unspecified: Secondary | ICD-10-CM | POA: Insufficient documentation

## 2016-08-05 LAB — GLUCOSE, POCT (MANUAL RESULT ENTRY): POC Glucose: 103 mg/dl — AB (ref 70–99)

## 2016-08-05 MED ORDER — ATENOLOL 25 MG PO TABS: 25.0000 mg | ORAL_TABLET | Freq: Every day | ORAL | 3 refills | Status: DC

## 2016-08-05 MED ORDER — ATORVASTATIN CALCIUM 20 MG PO TABS: 20.0000 mg | ORAL_TABLET | Freq: Every day | ORAL | 3 refills | Status: DC

## 2016-08-05 MED ORDER — GLIPIZIDE 5 MG PO TABS: 5.0000 mg | ORAL_TABLET | Freq: Every day | ORAL | 3 refills | Status: DC

## 2016-08-05 NOTE — Progress Notes (Signed)
Patient: Christine Cook Female    DOB: May 26, 1976   40 y.o.   MRN: [id number] Visit Date: 08/05/2016  Today's Provider: [place]   Chief Complaint  Patient presents with  . Other    est. PCP    Subjective:    HPI   Currently not taking most medictions due to cost. BP is good today- states she takes atenolol for HR and BP.  States she exercises 2x per week.   DM:  Last A1C was 8.3-no ton glipizide.  Not on Metformin due to GI upset -did not subside after 2 months.  Not monitoring sugars in diet.  Needs test strips.  CBGs running in the 200s.   HLD:  Last Trig was over 600.  Started on Atorvastatin but  not taking.    Denies CP, palpitations or SOB.    Allergies  Allergen Reactions  . Codeine   . Morphine   . Neurontin [Gabapentin] Other (See Comments)    syncope   Previous Medications   ATENOLOL (TENORMIN) 50 MG TABLET    Take 1 tablet (50 mg total) by mouth at bedtime.   ATORVASTATIN (LIPITOR) 20 MG TABLET    Take 1 tablet (20 mg total) by mouth daily.   CHOLECALCIFEROL 400 UNITS TABLET    Take 1 tablet (400 Units total) by mouth daily.   FLUOXETINE (PROZAC) 40 MG CAPSULE    Take 1 capsule (40 mg total) by mouth daily.   GLIPIZIDE (GLUCOTROL XL) 2.5 MG 24 HR TABLET    Take 1 tablet (2.5 mg total) by mouth daily with breakfast.   PANTOPRAZOLE (PROTONIX) 40 MG TABLET    Take 1 tablet (40 mg total) by mouth 2 (two) times daily.   PROBIOTIC PRODUCT (ALIGN) 4 MG CAPS    Take 1 capsule by mouth daily.   QUETIAPINE (SEROQUEL) 50 MG TABLET    Take 3 tablets (150 mg total) by mouth at bedtime.   VENLAFAXINE (EFFEXOR) 75 MG TABLET    Take 1 tablet (75 mg total) by mouth 2 (two) times daily with a meal.   VITAMIN B-12 1000 MCG TABLET    Take 1 tablet (1,000 mcg total) by mouth daily.    Review of Systems  All other systems reviewed and are negative.   Social History  Substance Use Topics  . Smoking status: Former Smoker    Packs/day: 0.25    Years:  2.00    Types: Cigarettes    Quit date: 02/14/1995  . Smokeless tobacco: Never Used     Comment: trying to quit - nicotine patches   . Alcohol use No     Comment: occationally    Objective:   BP 114/79   Ht 5\' 5"  (1.651 m)   Wt 178 lb (80.7 kg)   LMP 08/05/2016   SpO2 98%   BMI 29.62 kg/m   Physical Exam  Constitutional: She is oriented to person, place, and time. She appears well-developed and well-nourished.  HENT:  Head: Normocephalic and atraumatic.  Eyes: Pupils are equal, round, and reactive to light.  Neck: Normal range of motion. Neck supple.  Cardiovascular: Regular rhythm and normal heart sounds.   Tachy at 109  Pulmonary/Chest: Effort normal and breath sounds normal.  Abdominal: Soft. Bowel sounds are normal.  Musculoskeletal: Normal range of motion.  Neurological: She is alert and oriented to person, place, and time.  Skin: Skin is warm and dry.  Psychiatric: She has a normal mood and affect.  Vitals  reviewed.       Assessment & Plan:         DM:  Not controlled.  Encourage diabetic diet and exercise.  Increase glipizide to 5mg  daily.  Monitor CBG daily fasting- bring log to next ov.   HLD:  Start atorvastatin 20.  Repeat lipids at next ov.  Monitor fatty, fried foods in diet.   GERD:  Continue current regimen.  Avoid spicy foods.  Given Nexium samples. Once daily.   HTN/Tachycardia:  Will restart Atenolol at 25mg  daily.  BP is well controlled.  EKG on 07/11/16 showed ST.  Check BP at home bring log to next ov.    Smoking cessation counseling.  Denies current SI.- overdosed on benzos as SI attempt. Going to [employer]. Discussed places to call and go if she has any sucidial thoughts.  Attending AA for gambling addiction.   FU in 3 months for routine visit.  Labs ordered.   [facility]

## 2016-08-13 ENCOUNTER — Ambulatory Visit: Payer: Self-pay | Admitting: Pharmacy Technician

## 2016-08-13 ENCOUNTER — Encounter (INDEPENDENT_AMBULATORY_CARE_PROVIDER_SITE_OTHER): Payer: Self-pay

## 2016-08-13 DIAGNOSIS — Z79899 Other long term (current) drug therapy: Secondary | ICD-10-CM

## 2016-08-16 NOTE — Progress Notes (Signed)
Completed [facility] application and contract.  Patient agreed to all terms of the [facility] contract.  Patient to provide notarized letter regarding no healthcare or prescription benefits.  Provided patient with community resource material based on her particular needs.    [facility]

## 2016-09-16 ENCOUNTER — Ambulatory Visit: Payer: Self-pay | Admitting: Pharmacist

## 2016-09-16 ENCOUNTER — Encounter (INDEPENDENT_AMBULATORY_CARE_PROVIDER_SITE_OTHER): Payer: Self-pay

## 2016-09-16 DIAGNOSIS — Z79899 Other long term (current) drug therapy: Secondary | ICD-10-CM

## 2016-09-17 ENCOUNTER — Other Ambulatory Visit: Payer: Self-pay

## 2016-09-17 DIAGNOSIS — E7800 Pure hypercholesterolemia, unspecified: Secondary | ICD-10-CM

## 2016-09-17 DIAGNOSIS — K219 Gastro-esophageal reflux disease without esophagitis: Secondary | ICD-10-CM

## 2016-09-17 DIAGNOSIS — E118 Type 2 diabetes mellitus with unspecified complications: Secondary | ICD-10-CM

## 2016-09-17 MED ORDER — ATORVASTATIN CALCIUM 20 MG PO TABS: 20.0000 mg | ORAL_TABLET | Freq: Every day | ORAL | 3 refills | Status: DC

## 2016-09-17 MED ORDER — GLIPIZIDE 5 MG PO TABS: 5.0000 mg | ORAL_TABLET | Freq: Every day | ORAL | 3 refills | Status: DC

## 2016-09-17 MED ORDER — PANTOPRAZOLE SODIUM 40 MG PO TBEC: 40.0000 mg | DELAYED_RELEASE_TABLET | Freq: Two times a day (BID) | ORAL | 0 refills | Status: DC

## 2016-09-17 NOTE — Progress Notes (Signed)
[facility] Visit Note  Patient: Christine Cook MRN: [id number] Date of Birth: 1976-07-31 PCP: Boyce Medici, FNP   Christine Cook 40 y.o. female presents for an Intitial MTM visit today. First impression is that she is very upset and very dissatisfied with our service. She was initially very upset and defensive. She stated that we did not give her enough medication. I printed the record from our pharmacy processing software showing we filled 1 month supply of all medications on 10/17/17and that she picked them up on 08/13/16.   She states that we said we would try to get refills but that we haven't filled her medications. I explained that we needed new rx's and that the previous ones were written by a hospitalist which will not write/approve refills. She mentioned that she was seen at [facility] on 11/2. The rx's were sent to [facility]. I calmed pt down and explained that it would be very simple to get the medications transferred and that we could have them ready for her by 12/15. She was very thankful for this and admitted that she was very anxious about running out of her medication. I asked if she had an appt from a mental health provider and explained that we would need new scripts for those medications. She stated she has an upcoming appt with a provider at [facility].  There were no vitals taken for this visit.  Patient Information   Past Medical History:  Diagnosis Date  . Anxiety   . Depression   . Diabetes ([place])   . Fatty liver disease, nonalcoholic   . Heart murmur   . Heart palpitations   . High cholesterol   . 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 Hypertension   . Irritable bowel syndrome with diarrhea   . 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 Migraine   . 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 Sleep apnea with hypersomnolence    CPAP study 11-28-09,  10-02-2009 AHI was 16.5, pressure to   . Tachycardia       Past Surgical  History:  Procedure Laterality Date  . ADENOIDECTOMY    . BLADDER SURGERY    . CHOLECYSTECTOMY    . KNEE SURGERY     Left knee x 2   . TONSILLECTOMY       Family History  Problem Relation Age of Onset  . Asthma Mother   . Hypertension Father   . Coronary artery disease Father   . Diabetes Father   . Asthma Daughter   . Colon cancer Neg Hx     New Diagnoses (since last visit):   Family Support: pt has family in town, but feels very down and that they are "just waiting for her to fail"            History  Alcohol Use No    Comment: occationally       History  Smoking Status  . Former Smoker  . Packs/day: 0.25  . Years: 2.00  . Types: Cigarettes  . Quit date: 02/14/1995  Smokeless Tobacco  . Never Used    Comment: trying to quit - nicotine patches       Health Maintenance  Topic Date Due  . PNEUMOCOCCAL POLYSACCHARIDE VACCINE (1) 06/02/1977  . FOOT EXAM  06/02/1985  . OPHTHALMOLOGY EXAM  06/02/1985  . URINE MICROALBUMIN  06/02/1985  . HIV Screening  06/02/1990  . TETANUS/TDAP  06/02/1994  . PAP  SMEAR  06/02/1996  . HEMOGLOBIN A1C  01/07/2017  . INFLUENZA VACCINE  Completed   Prior to Admission medications   Medication Sig Start Date End Date Taking? Authorizing Provider  atenolol (TENORMIN) 25 MG tablet Take 1 tablet (25 mg total) by mouth daily. 08/05/16  Yes Teah Doles-Johnson, NP  atorvastatin (LIPITOR) 20 MG tablet Take 1 tablet (20 mg total) by mouth daily. 08/05/16  Yes Teah Doles-Johnson, NP  cholecalciferol 400 units tablet Take 1 tablet (400 Units total) by mouth daily. 07/20/16  Yes Hildred Priest, MD  FLUoxetine (PROZAC) 40 MG capsule Take 1 capsule (40 mg total) by mouth daily. 07/19/16  Yes Hildred Priest, MD  glipiZIDE (GLUCOTROL) 5 MG tablet Take 1 tablet (5 mg total) by mouth daily before breakfast. 08/05/16  Yes Teah Doles-Johnson, NP  pantoprazole (PROTONIX) 40 MG tablet Take 1 tablet (40 mg total) by mouth 2 (two)  times daily. 07/19/16  Yes Hildred Priest, MD  QUEtiapine (SEROQUEL) 50 MG tablet Take 3 tablets (150 mg total) by mouth at bedtime. 07/19/16  Yes Hildred Priest, MD  venlafaxine (EFFEXOR) 75 MG tablet Take 1 tablet (75 mg total) by mouth 2 (two) times daily with a meal. 07/19/16  Yes Hildred Priest, MD  vitamin B-12 1000 MCG tablet Take 1 tablet (1,000 mcg total) by mouth daily. 07/19/16  Yes Hildred Priest, MD  Probiotic Product (ALIGN) 4 MG CAPS Take 1 capsule by mouth daily.    Historical Provider, MD     Assessment and Plan:  Medication compliance/adherance: Medication reconciliation was performed. pt has been compliant with medications since she picked them up on 11/10. She has been out the last 2 days. She knows what/when/why she is taking each medication. She admits to becoming very upset and anxious if she thinks she might run out, especially of her mental health meds.   Depression/Anxiety:She talked with me at great length about her mental health struggles and that her primary need is to stay on her meds. Her goal is to find a job and sustain it.She told me that she "feels she is on the verge of needing to go back to [employer]." When we discussed her family support she stated that they don't understand and that she feels they are waiting to watch her fail. She is seeing a therapist at [facility] and has an upcoming appt with a provider for her prescriptions. I encouraged her, explained that I want to make sure she can stay on her meds so that she can meet her goals and that compliance with her meds will improve her overall health as well.  Netta Neat, PharmD, [facility]) [phone]

## 2016-09-23 ENCOUNTER — Other Ambulatory Visit: Payer: Self-pay | Admitting: Adult Health Nurse Practitioner

## 2016-09-23 DIAGNOSIS — E7800 Pure hypercholesterolemia, unspecified: Secondary | ICD-10-CM

## 2016-09-23 DIAGNOSIS — E118 Type 2 diabetes mellitus with unspecified complications: Secondary | ICD-10-CM

## 2016-09-23 MED ORDER — GLIPIZIDE 5 MG PO TABS: 5.0000 mg | ORAL_TABLET | Freq: Every day | ORAL | 3 refills | Status: DC

## 2016-09-23 MED ORDER — ATORVASTATIN CALCIUM 20 MG PO TABS: 20.0000 mg | ORAL_TABLET | Freq: Every day | ORAL | 3 refills | Status: DC

## 2016-10-04 HISTORY — PX: LEEP: SHX91

## 2016-10-05 LAB — HM HIV SCREENING LAB: HM HIV Screening: NEGATIVE

## 2016-10-05 LAB — HM PAP SMEAR: HM Pap smear: POSITIVE

## 2016-10-12 ENCOUNTER — Ambulatory Visit: Payer: Self-pay | Admitting: Adult Health Nurse Practitioner

## 2016-10-12 VITALS — BP 112/76 | HR 92 | Wt 180.0 lb

## 2016-10-12 DIAGNOSIS — E119 Type 2 diabetes mellitus without complications: Secondary | ICD-10-CM

## 2016-10-12 DIAGNOSIS — L02818 Cutaneous abscess of other sites: Secondary | ICD-10-CM

## 2016-10-12 DIAGNOSIS — L0291 Cutaneous abscess, unspecified: Secondary | ICD-10-CM | POA: Insufficient documentation

## 2016-10-12 LAB — GLUCOSE, POCT (MANUAL RESULT ENTRY): POC Glucose: 164 mg/dl — AB (ref 70–99)

## 2016-10-12 MED ORDER — CEPHALEXIN 500 MG PO CAPS
500.0000 mg | ORAL_CAPSULE | Freq: Two times a day (BID) | ORAL | 0 refills | Status: DC
Start: 2016-10-12 — End: 2017-02-17

## 2016-10-12 NOTE — Progress Notes (Signed)
Patient: Christine Cook Female    DOB: 1976/01/31   41 y.o.   MRN: [id number] Visit Date: 10/12/2016  Today's Provider: [place]   Chief Complaint  Patient presents with  . Cough   Subjective:    HPI    Pt states that she has had a cold for a week.  Pt states that she has clear drainage.  Taking OTC medications.  Denies fever, chills, N/V/D.    Has an ingrown hair on the mons pubis that [facility] and they told her she needed to come to her PCP to have it lanced and antibiotics prescribed.  Pt states that she has had the ingrown hair for a few weeks- it gets inflamed because it is at the panty line.  No open area or drainage.  Mild redness.  Not sexually active.    Allergies  Allergen Reactions  . Codeine Hives and Nausea And Vomiting  . Morphine Hives  . Neurontin [Gabapentin] Other (See Comments)    syncope   Previous Medications   ATENOLOL (TENORMIN) 25 MG TABLET    Take 1 tablet (25 mg total) by mouth daily.   ATORVASTATIN (LIPITOR) 20 MG TABLET    Take 1 tablet (20 mg total) by mouth daily.   CHOLECALCIFEROL 400 UNITS TABLET    Take 1 tablet (400 Units total) by mouth daily.   FLUOXETINE (PROZAC) 40 MG CAPSULE    Take 1 capsule (40 mg total) by mouth daily.   GLIPIZIDE (GLUCOTROL) 5 MG TABLET    Take 1 tablet (5 mg total) by mouth daily before breakfast.   PANTOPRAZOLE (PROTONIX) 40 MG TABLET    Take 1 tablet (40 mg total) by mouth 2 (two) times daily.   PROBIOTIC PRODUCT (ALIGN) 4 MG CAPS    Take 1 capsule by mouth daily.   QUETIAPINE (SEROQUEL) 50 MG TABLET    Take 3 tablets (150 mg total) by mouth at bedtime.   VENLAFAXINE (EFFEXOR) 75 MG TABLET    Take 1 tablet (75 mg total) by mouth 2 (two) times daily with a meal.   VITAMIN B-12 1000 MCG TABLET    Take 1 tablet (1,000 mcg total) by mouth daily.    Review of Systems  All other systems reviewed and are negative.   Social History  Substance Use Topics  . Smoking  status: Former Smoker    Packs/day: 0.25    Years: 2.00    Types: Cigarettes    Quit date: 02/14/1995  . Smokeless tobacco: Never Used     Comment: trying to quit - nicotine patches   . Alcohol use No     Comment: occationally    Objective:   BP 112/76   Pulse 92   Wt 180 lb (81.6 kg)   BMI 29.95 kg/m   Physical Exam  Constitutional: She is oriented to person, place, and time. She appears well-developed and well-nourished.  HENT:  Head: Normocephalic and atraumatic.  Eyes: Pupils are equal, round, and reactive to light.  Neck: Normal range of motion. Neck supple.  Cardiovascular: Normal rate, regular rhythm and normal heart sounds.   Pulmonary/Chest: Effort normal and breath sounds normal.  Abdominal: Soft. Bowel sounds are normal.  Genitourinary:  Genitourinary Comments: Nickel sized purple area on the R side of the mon pubis. No warmth or drainage. Opened and drained with small amount of purulent and sanguinous drainage.   Neurological: She is alert and oriented to person, place,  and time.  Skin: Skin is warm and dry.  Psychiatric: She has a normal mood and affect.  Vitals reviewed.       Assessment & Plan:        Continue supportive care for URI.    Abscess:  Wash with soap and water.  Cover with dry dressing.  Warm compress to area.  Take Keflex BID x 7 days.  FU is not improving.   [facility]

## 2016-10-18 ENCOUNTER — Other Ambulatory Visit: Payer: Self-pay | Admitting: Adult Health Nurse Practitioner

## 2016-10-21 ENCOUNTER — Other Ambulatory Visit: Payer: Self-pay

## 2016-10-26 ENCOUNTER — Other Ambulatory Visit: Payer: Self-pay

## 2016-10-26 DIAGNOSIS — E119 Type 2 diabetes mellitus without complications: Secondary | ICD-10-CM

## 2016-10-26 DIAGNOSIS — Z794 Long term (current) use of insulin: Principal | ICD-10-CM

## 2016-10-26 DIAGNOSIS — E785 Hyperlipidemia, unspecified: Secondary | ICD-10-CM

## 2016-10-27 LAB — COMPREHENSIVE METABOLIC PANEL
ALT: 28 IU/L (ref 0–32)
AST: 31 IU/L (ref 0–40)
Albumin/Globulin Ratio: 1.8 (ref 1.2–2.2)
Albumin: 4.4 g/dL (ref 3.5–5.5)
Alkaline Phosphatase: 82 IU/L (ref 39–117)
BUN/Creatinine Ratio: 13 (ref 9–23)
BUN: 8 mg/dL (ref 6–24)
Bilirubin Total: 0.3 mg/dL (ref 0.0–1.2)
CO2: 25 mmol/L (ref 18–29)
Calcium: 9.5 mg/dL (ref 8.7–10.2)
Chloride: 99 mmol/L (ref 96–106)
Creatinine, Ser: 0.64 mg/dL (ref 0.57–1.00)
GFR calc Af Amer: 128 mL/min/{1.73_m2} (ref >59)
GFR calc non Af Amer: 111 mL/min/{1.73_m2} (ref >59)
Globulin, Total: 2.4 g/dL (ref 1.5–4.5)
Glucose: 109 mg/dL — ABNORMAL HIGH (ref 65–99)
Potassium: 3.9 mmol/L (ref 3.5–5.2)
Sodium: 140 mmol/L (ref 134–144)
Total Protein: 6.8 g/dL (ref 6.0–8.5)

## 2016-10-27 LAB — MICROALBUMIN / CREATININE URINE RATIO
Creatinine, Urine: 107.2 mg/dL
Microalb/Creat Ratio: 5.1 mg/g creat (ref 0.0–30.0)
Microalbumin, Urine: 5.5 ug/mL

## 2016-10-27 LAB — LIPID PANEL
Chol/HDL Ratio: 5.6 ratio units — ABNORMAL HIGH (ref 0.0–4.4)
Cholesterol, Total: 189 mg/dL (ref 100–199)
HDL: 34 mg/dL — ABNORMAL LOW (ref >39)
Triglycerides: 430 mg/dL — ABNORMAL HIGH (ref 0–149)

## 2016-10-27 LAB — HEMOGLOBIN A1C
Est. average glucose Bld gHb Est-mCnc: 183 mg/dL
Hgb A1c MFr Bld: 8 % — ABNORMAL HIGH (ref 4.8–5.6)

## 2016-10-28 ENCOUNTER — Other Ambulatory Visit: Payer: Self-pay | Admitting: Adult Health Nurse Practitioner

## 2016-10-28 MED ORDER — METOPROLOL TARTRATE 25 MG PO TABS: 12.5000 mg | ORAL_TABLET | Freq: Two times a day (BID) | ORAL | 2 refills | Status: DC

## 2016-11-04 ENCOUNTER — Ambulatory Visit: Payer: Self-pay | Admitting: Adult Health Nurse Practitioner

## 2016-11-04 VITALS — BP 138/88 | HR 94 | Temp 98.3°F | Wt 180.0 lb

## 2016-11-04 DIAGNOSIS — E785 Hyperlipidemia, unspecified: Secondary | ICD-10-CM

## 2016-11-04 DIAGNOSIS — I10 Essential (primary) hypertension: Secondary | ICD-10-CM

## 2016-11-04 DIAGNOSIS — M545 Low back pain, unspecified: Secondary | ICD-10-CM | POA: Insufficient documentation

## 2016-11-04 DIAGNOSIS — K219 Gastro-esophageal reflux disease without esophagitis: Secondary | ICD-10-CM

## 2016-11-04 DIAGNOSIS — E119 Type 2 diabetes mellitus without complications: Secondary | ICD-10-CM

## 2016-11-04 LAB — GLUCOSE, POCT (MANUAL RESULT ENTRY): POC Glucose: 166 mg/dl — AB (ref 70–99)

## 2016-11-04 MED ORDER — CYCLOBENZAPRINE HCL 5 MG PO TABS: 5.0000 mg | ORAL_TABLET | Freq: Every evening | ORAL | 0 refills | Status: DC | PRN

## 2016-11-04 NOTE — Progress Notes (Signed)
Patient: Christine Cook Female    DOB: [birth year]   41 y.o.   MRN: [id number] Visit Date: 11/04/2016  Today's Provider: Staci Acosta, NP   Chief Complaint  Patient presents with  . Follow-up  . Back Pain    left middle back pain, constant, intensifies intermittenly, pain scale = 6  . Mass    abses on outside of vagina  . Diarrhea  . Headache    sinus congestion, forehead   Subjective:    HPI  DM:  Last visit glipizide was increased to 5mg  daily.  Taking medications as directed.  States it may be causing slight stomach upset but not as bad as Metformin.  Monitoring diet.  A1C down to 8 from 8.3.   HLD:  TG: 430 down from 610.  Started atorvastatin 20 at last visit.  Tolerating well.   GERD: Reports no indigestion of heartburn.    HTN:  Still on Atenolol.  Atenolol changed to metoprolol due to drug being on back order.   Having a coloposcopy later this month at [facility] due to abnormal cells on a PAP.   Pt with bad L side back pain.  Pt states that she was scrubbing the floor at church last week.  Has been taking OTC medications with minimal relief.  Pain rated 6/10.        Allergies  Allergen Reactions  . Codeine Hives and Nausea And Vomiting  . Morphine Hives  . Neurontin [Gabapentin] Other (See Comments)    syncope   Previous Medications   ATORVASTATIN (LIPITOR) 20 MG TABLET    Take 1 tablet (20 mg total) by mouth daily.   CEPHALEXIN (KEFLEX) 500 MG CAPSULE    Take 1 capsule (500 mg total) by mouth 2 (two) times daily.   CHOLECALCIFEROL 400 UNITS TABLET    Take 1 tablet (400 Units total) by mouth daily.   FLUOXETINE (PROZAC) 40 MG CAPSULE    Take 1 capsule (40 mg total) by mouth daily.   GLIPIZIDE (GLUCOTROL) 5 MG TABLET    Take 1 tablet (5 mg total) by mouth daily before breakfast.   METOPROLOL TARTRATE (LOPRESSOR) 25 MG TABLET    Take 0.5 tablets (12.5 mg total) by mouth 2 (two) times daily.   PANTOPRAZOLE (PROTONIX) 40 MG TABLET    Take 1 tablet  (40 mg total) by mouth 2 (two) times daily.   PROBIOTIC PRODUCT (ALIGN) 4 MG CAPS    Take 1 capsule by mouth daily.   QUETIAPINE (SEROQUEL) 50 MG TABLET    Take 3 tablets (150 mg total) by mouth at bedtime.   VENLAFAXINE (EFFEXOR) 75 MG TABLET    Take 1 tablet (75 mg total) by mouth 2 (two) times daily with a meal.   VITAMIN B-12 1000 MCG TABLET    Take 1 tablet (1,000 mcg total) by mouth daily.    Review of Systems  All other systems reviewed and are negative.   Social History  Substance Use Topics  . Smoking status: Former Smoker    Packs/day: 0.25    Years: 2.00    Types: Cigarettes    Quit date: 02/14/1995  . Smokeless tobacco: Never Used     Comment: trying to quit - nicotine patches   . Alcohol use No     Comment: occationally    Objective:   BP 138/88   Pulse 94   Temp 98.3 F (36.8 C)   Wt 180 lb (81.6 kg)   BMI 29.95 kg/m  Physical Exam  Constitutional: She is oriented to person, place, and time. She appears well-developed and well-nourished.  HENT:  Head: Normocephalic and atraumatic.  Eyes: Pupils are equal, round, and reactive to light.  Neck: Normal range of motion. Neck supple.  Cardiovascular: Normal rate, regular rhythm and normal heart sounds.   Pulmonary/Chest: Effort normal and breath sounds normal.  Abdominal: Soft. Bowel sounds are normal.  Neurological: She is alert and oriented to person, place, and time.  Skin: Skin is warm and dry.  Psychiatric: She has a normal mood and affect.  Vitals reviewed.       Assessment & Plan:          HTN:  Borderline today, previously well controlled.  Goal BP <140/80.  Continue current medication regimen.  Encourage low salt diet and exercise.  Once switched to Metoprolol take BP at home and if elevated or HR >100 call will increase Metoprolol to a whole tablet.   DM:  Not controlled.  Encourage diabetic diet and exercise.  Continue current medication regimen.   GERD: Stable on protonix.  Avoid  triggers.   HLD:  Improved-still not controlled.  Continue current regimen.  Encourage low cholesterol, low fat diet and exercise.   Back Pain:  Take flexeril at bedtime.  Stretch back.  Cold/hot in rotation.  FU precautions given.      Staci Acosta, NP   [facility]

## 2016-11-24 ENCOUNTER — Telehealth: Payer: Self-pay

## 2016-11-24 NOTE — Telephone Encounter (Signed)
Pt called in stating she is having SOB and high bp. The bp's are running 140's/90's. I explained cardiac symptoms to pt and told her to go to the ER if she starts having any. I made pt appt for thurs 02/22. Pt verbalized understanding.

## 2016-11-25 ENCOUNTER — Ambulatory Visit: Payer: Self-pay | Admitting: Adult Health Nurse Practitioner

## 2016-11-25 VITALS — BP 127/79 | HR 74 | Temp 98.4°F | Wt 181.4 lb

## 2016-11-25 DIAGNOSIS — E119 Type 2 diabetes mellitus without complications: Secondary | ICD-10-CM

## 2016-11-25 LAB — GLUCOSE, POCT (MANUAL RESULT ENTRY): POC Glucose: 132 mg/dl — AB (ref 70–99)

## 2016-11-25 MED ORDER — METOPROLOL TARTRATE 25 MG PO TABS: 25.0000 mg | ORAL_TABLET | Freq: Two times a day (BID) | ORAL | 2 refills | Status: DC

## 2016-11-25 MED ORDER — ONDANSETRON HCL 4 MG PO TABS: 4.0000 mg | ORAL_TABLET | Freq: Three times a day (TID) | ORAL | 0 refills | Status: DC | PRN

## 2016-11-25 NOTE — Progress Notes (Signed)
Patient: Christine Cook Female    DOB: November 27, 1975   41 y.o.   MRN: [id number] Visit Date: 11/25/2016  Today's Provider: Staci Acosta, NP   Chief Complaint  Patient presents with  . Shortness of Breath  . Hypertension  . Headache  . Diarrhea  . Flushing  . joint pain   Subjective:    HPI  Pt states that she feels like she did before she started BP medications.  It has started in the past two weeks- cheek flushing, SOB, heart racing, nausea/vomiting in the am and diarrhea every day.  Denies CP.  States that her BP is elevated at times but ok today.   A month ago pt had increase in prozac and decrease in effexor.  Pt has also had change to metoprolol from atenolol and that is when these symptoms occurred.  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      Allergies  Allergen Reactions  . Codeine Hives and Nausea And Vomiting  . Morphine Hives  . Neurontin [Gabapentin] Other (See Comments)    syncope   Previous Medications   ATORVASTATIN (LIPITOR) 20 MG TABLET    Take 1 tablet (20 mg total) by mouth daily.   CEPHALEXIN (KEFLEX) 500 MG CAPSULE    Take 1 capsule (500 mg total) by mouth 2 (two) times daily.   CHOLECALCIFEROL 400 UNITS TABLET    Take 1 tablet (400 Units total) by mouth daily.   CYCLOBENZAPRINE (FLEXERIL) 5 MG TABLET    Take 1 tablet (5 mg total) by mouth at bedtime as needed for muscle spasms.   FLUOXETINE (PROZAC) 40 MG CAPSULE    Take 1 capsule (40 mg total) by mouth daily.   GLIPIZIDE (GLUCOTROL) 5 MG TABLET    Take 1 tablet (5 mg total) by mouth daily before breakfast.   METOPROLOL TARTRATE (LOPRESSOR) 25 MG TABLET    Take 0.5 tablets (12.5 mg total) by mouth 2 (two) times daily.   PANTOPRAZOLE (PROTONIX) 40 MG TABLET    Take 1 tablet (40 mg total) by mouth 2 (two) times daily.   PROBIOTIC PRODUCT (ALIGN) 4 MG CAPS    Take 1 capsule by mouth daily.   QUETIAPINE (SEROQUEL) 50 MG TABLET    Take 3 tablets (150 mg total) by mouth at bedtime.   VENLAFAXINE (EFFEXOR)  75 MG TABLET    Take 1 tablet (75 mg total) by mouth 2 (two) times daily with a meal.   VITAMIN B-12 1000 MCG TABLET    Take 1 tablet (1,000 mcg total) by mouth daily.    Review of Systems  All other systems reviewed and are negative.   Social History  Substance Use Topics  . Smoking status: Former Smoker    Packs/day: 0.25    Years: 2.00    Types: Cigarettes    Quit date: 02/14/1995  . Smokeless tobacco: Never Used     Comment: trying to quit - nicotine patches   . Alcohol use No     Comment: occationally    Objective:   BP 127/79   Pulse 74   Temp 98.4 F (36.9 C)   Wt 181 lb 6.4 oz (82.3 kg)   BMI 30.19 kg/m   Physical Exam  Constitutional: She is oriented to person, place, and time. She appears well-developed and well-nourished.  Neck: Neck supple.  Cardiovascular: Normal rate and regular rhythm.   Pulmonary/Chest: Breath sounds normal.  Abdominal: Soft. Bowel sounds are normal. She exhibits no distension and no  mass. There is no tenderness. There is no rebound and no guarding.  Neurological: She is alert and oriented to person, place, and time.  Skin: Skin is warm and dry.  Vitals reviewed.       Assessment & Plan:       Will order CBC and CMET today.  Zofran for nausea.  Supportive care.  Increase Metoprolol to 25mg  BID.  Increase fiber in diet.  FU in 2 weeks or sooner if symptoms worsen.       Staci Acosta, NP   [facility]

## 2016-11-26 LAB — COMPREHENSIVE METABOLIC PANEL
ALT: 54 IU/L — ABNORMAL HIGH (ref 0–32)
AST: 78 IU/L — ABNORMAL HIGH (ref 0–40)
Albumin/Globulin Ratio: 2 (ref 1.2–2.2)
Albumin: 4.6 g/dL (ref 3.5–5.5)
Alkaline Phosphatase: 94 IU/L (ref 39–117)
BUN/Creatinine Ratio: 10 (ref 9–23)
BUN: 8 mg/dL (ref 6–24)
Bilirubin Total: 0.3 mg/dL (ref 0.0–1.2)
CO2: 24 mmol/L (ref 18–29)
Calcium: 9.7 mg/dL (ref 8.7–10.2)
Chloride: 100 mmol/L (ref 96–106)
Creatinine, Ser: 0.84 mg/dL (ref 0.57–1.00)
GFR calc Af Amer: 100 (ref >59)
GFR calc non Af Amer: 87 (ref >59)
Globulin, Total: 2.3 (ref 1.5–4.5)
Glucose: 108 mg/dL — ABNORMAL HIGH (ref 65–99)
Potassium: 4 mmol/L (ref 3.5–5.2)
Sodium: 143 mmol/L (ref 134–144)
Total Protein: 6.9 g/dL (ref 6.0–8.5)

## 2016-11-26 LAB — CBC
Hematocrit: 37.7 % (ref 34.0–46.6)
Hemoglobin: 12.4 g/dL (ref 11.1–15.9)
MCH: 27 pg (ref 26.6–33.0)
MCHC: 32.9 g/dL (ref 31.5–35.7)
MCV: 82 fL (ref 79–97)
Platelets: 285 10*3/uL (ref 150–379)
RBC: 4.59 x10E6/uL (ref 3.77–5.28)
RDW: 14.9 % (ref 12.3–15.4)
WBC: 8 10*3/uL (ref 3.4–10.8)

## 2016-12-01 DIAGNOSIS — N879 Dysplasia of cervix uteri, unspecified: Secondary | ICD-10-CM | POA: Insufficient documentation

## 2016-12-07 ENCOUNTER — Telehealth: Payer: Self-pay

## 2016-12-07 NOTE — Telephone Encounter (Signed)
Called pt with results. PT verbalized understanding. 

## 2016-12-07 NOTE — Telephone Encounter (Signed)
-----   Message from Staci Acosta, NP sent at 11/30/2016  5:45 PM EST ----- Liver enzymes acutely elevated, were normal on last visit. Avoid any medications containing tylenol- will reevaluate at next visit. Otherwise labs stable.

## 2016-12-09 ENCOUNTER — Ambulatory Visit: Payer: Self-pay | Admitting: Adult Health Nurse Practitioner

## 2016-12-09 DIAGNOSIS — R232 Flushing: Secondary | ICD-10-CM

## 2016-12-09 DIAGNOSIS — R110 Nausea: Secondary | ICD-10-CM | POA: Insufficient documentation

## 2016-12-09 NOTE — Progress Notes (Signed)
Patient: Christine Cook Female    DOB: March 12, 1976   41 y.o.   MRN: [id number] Visit Date: 12/09/2016  Today's Provider: Staci Acosta, NP   Chief Complaint  Patient presents with  . Follow-up    lab results, no complaint of sciatic pain   Subjective:    HPI   Pt states she still feels nauseated with flushing.  States symptoms are improved since last visit.  States there is no way that she can be pregnant- no sexual activity.  Pt currently has an IUD that was placed in 2015.     Allergies  Allergen Reactions  . Codeine Hives and Nausea And Vomiting  . Morphine Hives  . Neurontin [Gabapentin] Other (See Comments)    syncope   Previous Medications   ATORVASTATIN (LIPITOR) 20 MG TABLET    Take 1 tablet (20 mg total) by mouth daily.   CEPHALEXIN (KEFLEX) 500 MG CAPSULE    Take 1 capsule (500 mg total) by mouth 2 (two) times daily.   CHOLECALCIFEROL 400 UNITS TABLET    Take 1 tablet (400 Units total) by mouth daily.   CYCLOBENZAPRINE (FLEXERIL) 5 MG TABLET    Take 1 tablet (5 mg total) by mouth at bedtime as needed for muscle spasms.   FLUOXETINE (PROZAC) 40 MG CAPSULE    Take 1 capsule (40 mg total) by mouth daily.   GLIPIZIDE (GLUCOTROL) 5 MG TABLET    Take 1 tablet (5 mg total) by mouth daily before breakfast.   METOPROLOL TARTRATE (LOPRESSOR) 25 MG TABLET    Take 1 tablet (25 mg total) by mouth 2 (two) times daily.   ONDANSETRON (ZOFRAN) 4 MG TABLET    Take 1 tablet (4 mg total) by mouth every 8 (eight) hours as needed for nausea or vomiting.   PANTOPRAZOLE (PROTONIX) 40 MG TABLET    Take 1 tablet (40 mg total) by mouth 2 (two) times daily.   PROBIOTIC PRODUCT (ALIGN) 4 MG CAPS    Take 1 capsule by mouth daily.   QUETIAPINE (SEROQUEL) 50 MG TABLET    Take 3 tablets (150 mg total) by mouth at bedtime.   VENLAFAXINE (EFFEXOR) 75 MG TABLET    Take 1 tablet (75 mg total) by mouth 2 (two) times daily with a meal.   VITAMIN B-12 1000 MCG TABLET    Take 1 tablet (1,000 mcg total)  by mouth daily.    Review of Systems  All other systems reviewed and are negative.   Social History  Substance Use Topics  . Smoking status: Former Smoker    Packs/day: 0.25    Years: 2.00    Types: Cigarettes    Quit date: 02/14/1995  . Smokeless tobacco: Never Used     Comment: trying to quit - nicotine patches   . Alcohol use No     Comment: occationally    Objective:   BP 125/83   Pulse 77   Temp 98.3 F (36.8 C)   Wt 183 lb 4.8 oz (83.1 kg)   BMI 30.50 kg/m   Physical Exam  Constitutional: She is oriented to person, place, and time. She appears well-developed and well-nourished.  Neck: Neck supple.  Cardiovascular: Normal rate and regular rhythm.   Pulmonary/Chest: Effort normal and breath sounds normal.  Abdominal: Soft. Bowel sounds are normal.  Neurological: She is alert and oriented to person, place, and time.  Skin: Skin is dry.  Vitals reviewed.       Assessment & Plan:  FU with GYN for ? Menopausal testing.  Paperwork for charity care. Supportive care.  Check TSH next week when lab is open.          Staci Acosta, NP   [facility]

## 2016-12-14 ENCOUNTER — Other Ambulatory Visit: Payer: Self-pay

## 2016-12-14 DIAGNOSIS — Z0000 Encounter for general adult medical examination without abnormal findings: Secondary | ICD-10-CM

## 2016-12-15 LAB — COMPREHENSIVE METABOLIC PANEL
ALT: 24 IU/L (ref 0–32)
AST: 23 IU/L (ref 0–40)
Albumin/Globulin Ratio: 2.1 (ref 1.2–2.2)
Albumin: 4.5 g/dL (ref 3.5–5.5)
Alkaline Phosphatase: 90 IU/L (ref 39–117)
BUN/Creatinine Ratio: 11 (ref 9–23)
BUN: 8 mg/dL (ref 6–24)
Bilirubin Total: 0.4 mg/dL (ref 0.0–1.2)
CO2: 27 mmol/L (ref 18–29)
Calcium: 9 mg/dL (ref 8.7–10.2)
Chloride: 100 mmol/L (ref 96–106)
Creatinine, Ser: 0.72 mg/dL (ref 0.57–1.00)
GFR calc Af Amer: 120 mL/min/{1.73_m2} (ref >59)
GFR calc non Af Amer: 104 mL/min/{1.73_m2} (ref >59)
Globulin, Total: 2.1 g/dL (ref 1.5–4.5)
Glucose: 187 mg/dL — ABNORMAL HIGH (ref 65–99)
Potassium: 4.1 mmol/L (ref 3.5–5.2)
Sodium: 142 mmol/L (ref 134–144)
Total Protein: 6.6 g/dL (ref 6.0–8.5)

## 2016-12-15 LAB — LIPID PANEL
Chol/HDL Ratio: 5.8 ratio units — ABNORMAL HIGH (ref 0.0–4.4)
Cholesterol, Total: 196 mg/dL (ref 100–199)
HDL: 34 mg/dL — ABNORMAL LOW (ref >39)
LDL Calculated: 95 mg/dL (ref 0–99)
Triglycerides: 335 mg/dL — ABNORMAL HIGH (ref 0–149)
VLDL Cholesterol Cal: 67 mg/dL — ABNORMAL HIGH (ref 5–40)

## 2016-12-15 LAB — LDL CHOLESTEROL, DIRECT: LDL Direct: 127 mg/dL — ABNORMAL HIGH (ref 0–99)

## 2016-12-15 LAB — HEMOGLOBIN A1C
Est. average glucose Bld gHb Est-mCnc: 183 mg/dL
Hgb A1c MFr Bld: 8 % — ABNORMAL HIGH (ref 4.8–5.6)

## 2016-12-23 NOTE — Progress Notes (Signed)
Please have pt increase her glipizide to  5 mg twice a day, and check her blood sugar once a day, every other day checking in the evening, f/u one month

## 2016-12-24 ENCOUNTER — Other Ambulatory Visit: Payer: Self-pay

## 2016-12-24 DIAGNOSIS — E118 Type 2 diabetes mellitus with unspecified complications: Secondary | ICD-10-CM

## 2016-12-24 MED ORDER — GLIPIZIDE 5 MG PO TABS: 5.0000 mg | ORAL_TABLET | Freq: Two times a day (BID) | ORAL | 3 refills | Status: DC

## 2016-12-24 NOTE — Telephone Encounter (Signed)
Pt called for refill on glipizide.

## 2016-12-28 ENCOUNTER — Other Ambulatory Visit: Payer: Self-pay

## 2016-12-28 DIAGNOSIS — E079 Disorder of thyroid, unspecified: Secondary | ICD-10-CM

## 2016-12-29 LAB — TSH: TSH: 3.23 u[IU]/mL (ref 0.450–4.500)

## 2017-01-25 ENCOUNTER — Ambulatory Visit: Payer: Self-pay | Admitting: Urology

## 2017-01-25 VITALS — BP 149/82 | HR 62 | Temp 98.7°F | Wt 183.0 lb

## 2017-01-25 DIAGNOSIS — E7800 Pure hypercholesterolemia, unspecified: Secondary | ICD-10-CM

## 2017-01-25 DIAGNOSIS — R202 Paresthesia of skin: Secondary | ICD-10-CM

## 2017-01-25 DIAGNOSIS — N951 Menopausal and female climacteric states: Secondary | ICD-10-CM

## 2017-01-25 DIAGNOSIS — R200 Anesthesia of skin: Secondary | ICD-10-CM

## 2017-01-25 DIAGNOSIS — E119 Type 2 diabetes mellitus without complications: Secondary | ICD-10-CM

## 2017-01-25 DIAGNOSIS — K219 Gastro-esophageal reflux disease without esophagitis: Secondary | ICD-10-CM

## 2017-01-25 LAB — GLUCOSE, POCT (MANUAL RESULT ENTRY): POC Glucose: 97 mg/dl (ref 70–99)

## 2017-01-25 MED ORDER — ATORVASTATIN CALCIUM 20 MG PO TABS: 20.0000 mg | ORAL_TABLET | Freq: Every day | ORAL | 0 refills | Status: DC

## 2017-01-25 MED ORDER — PANTOPRAZOLE SODIUM 40 MG PO TBEC: 40.0000 mg | DELAYED_RELEASE_TABLET | Freq: Two times a day (BID) | ORAL | 0 refills | Status: DC

## 2017-01-25 MED ORDER — METOPROLOL TARTRATE 25 MG PO TABS: 25.0000 mg | ORAL_TABLET | Freq: Two times a day (BID) | ORAL | 0 refills | Status: DC

## 2017-01-25 MED ORDER — GLIPIZIDE 5 MG PO TABS: 5.0000 mg | ORAL_TABLET | Freq: Two times a day (BID) | ORAL | 0 refills | Status: DC

## 2017-01-25 MED ORDER — PIOGLITAZONE HCL 15 MG PO TABS: 15.0000 mg | ORAL_TABLET | Freq: Every day | ORAL | 0 refills | Status: DC

## 2017-01-25 NOTE — Progress Notes (Signed)
Patient: Christine Cook Female    DOB: 10-16-1975   41 y.o.   MRN: [id number] Visit Date: 01/25/2017  Today's Provider: Zara Council, PA-C   Chief Complaint  Patient presents with  . Nausea  . Numbness   Subjective:    HPI Patient with numbness and tingling in both hands and feet.   This has been going on for one month.  It is in a mitten and sock distribution.    Checking BS bid average is 150-250.   Patient on glipizide 5 mg bid.   States she has a history of Vitamin B 12 deficiency   Pt states she still feels nauseated with flushing States there is no way that she can be pregnant- no sexual activity.  Pt currently has an IUD that was placed in 2015.   HTN which patient attributes to having a hot flash while BP was being taken    Allergies  Allergen Reactions  . Codeine Hives and Nausea And Vomiting  . Morphine Hives  . Neurontin [Gabapentin] Other (See Comments)    syncope   Previous Medications   ARIPIPRAZOLE (ABILIFY) 2 MG TABLET    Take 2 mg by mouth daily.   ATORVASTATIN (LIPITOR) 20 MG TABLET    Take 1 tablet (20 mg total) by mouth daily.   CEPHALEXIN (KEFLEX) 500 MG CAPSULE    Take 1 capsule (500 mg total) by mouth 2 (two) times daily.   CHOLECALCIFEROL 400 UNITS TABLET    Take 1 tablet (400 Units total) by mouth daily.   CYCLOBENZAPRINE (FLEXERIL) 5 MG TABLET    Take 1 tablet (5 mg total) by mouth at bedtime as needed for muscle spasms.   FLUOXETINE (PROZAC) 40 MG CAPSULE    Take 1 capsule (40 mg total) by mouth daily.   GLIPIZIDE (GLUCOTROL) 5 MG TABLET    Take 1 tablet (5 mg total) by mouth 2 (two) times daily before a meal.   METOPROLOL TARTRATE (LOPRESSOR) 25 MG TABLET    Take 1 tablet (25 mg total) by mouth 2 (two) times daily.   ONDANSETRON (ZOFRAN) 4 MG TABLET    Take 1 tablet (4 mg total) by mouth every 8 (eight) hours as needed for nausea or vomiting.   PANTOPRAZOLE (PROTONIX) 40 MG TABLET    Take 1 tablet (40 mg total) by mouth 2 (two) times daily.   PROBIOTIC PRODUCT (ALIGN) 4 MG CAPS    Take 1 capsule by mouth daily.   QUETIAPINE (SEROQUEL) 50 MG TABLET    Take 3 tablets (150 mg total) by mouth at bedtime.   VENLAFAXINE (EFFEXOR) 75 MG TABLET    Take 1 tablet (75 mg total) by mouth 2 (two) times daily with a meal.   VITAMIN B-12 1000 MCG TABLET    Take 1 tablet (1,000 mcg total) by mouth daily.    Review of Systems  All other systems reviewed and are negative.   Social History  Substance Use Topics  . Smoking status: Former Smoker    Packs/day: 0.25    Years: 2.00    Types: Cigarettes    Quit date: 02/14/1995  . Smokeless tobacco: Never Used     Comment: trying to quit - nicotine patches   . Alcohol use No     Comment: occationally    Objective:   BP (!) 149/82   Pulse 62   Temp 98.7 F (37.1 C)   Wt 183 lb (83 kg)   BMI 30.45 kg/m  Physical Exam  Constitutional: She is oriented to person, place, and time. She appears well-developed and well-nourished.  Neck: Neck supple.  Cardiovascular: Normal rate and regular rhythm.   Pulmonary/Chest: Effort normal and breath sounds normal.  Abdominal: Soft. Bowel sounds are normal.  Neurological: She is alert and oriented to person, place, and time.  Skin: Skin is dry.  Vitals reviewed. Hands with no loss of sensation, no loss of ROM and good pulses  Feet with no loss of sensation, no loss of ROM and good pulses      Assessment & Plan:     1. Perimenopausal  - TSH normal  - check hcg, prolactin, FSH/LH, estradiol  2. DM  - still not controlled  - continue glyburide 5 mg bid  - add pioglitazone 15 mg qd  3. HTN  - continue metoprolol 25 mg bid    4. Peripheral neuropathy  - ? DM vs Vitamin B deficiency - check levels of B12 and folate  FU with GYN for ? Menopausal testing.  Paperwork for charity care - sent in hasn't heard anything yet Supportive care.  TSH wnl         Zara Council, PA-C   [facility]

## 2017-01-26 LAB — ESTRADIOL: Estradiol: 50 pg/mL

## 2017-01-26 LAB — B12 AND FOLATE PANEL
Folate: 13.7 ng/mL (ref >3.0)
Vitamin B-12: 1190 pg/mL (ref 232–1245)

## 2017-01-26 LAB — FSH/LH
FSH: 10.2 m[IU]/mL
LH: 3.4 m[IU]/mL

## 2017-01-26 LAB — BETA HCG QUANT (REF LAB): hCG Quant: <1 m[IU]/mL

## 2017-01-26 LAB — PROLACTIN: Prolactin: 13.2 ng/mL (ref 4.8–23.3)

## 2017-02-09 ENCOUNTER — Other Ambulatory Visit: Payer: Self-pay

## 2017-02-09 DIAGNOSIS — E119 Type 2 diabetes mellitus without complications: Secondary | ICD-10-CM

## 2017-02-10 LAB — COMPREHENSIVE METABOLIC PANEL
ALT: 21 IU/L (ref 0–32)
AST: 21 IU/L (ref 0–40)
Albumin/Globulin Ratio: 1.9 (ref 1.2–2.2)
Albumin: 4.1 g/dL (ref 3.5–5.5)
Alkaline Phosphatase: 82 IU/L (ref 39–117)
BUN/Creatinine Ratio: 10 (ref 9–23)
BUN: 7 mg/dL (ref 6–24)
Bilirubin Total: 0.5 mg/dL (ref 0.0–1.2)
CO2: 25 mmol/L (ref 18–29)
Calcium: 8.7 mg/dL (ref 8.7–10.2)
Chloride: 102 mmol/L (ref 96–106)
Creatinine, Ser: 0.72 mg/dL (ref 0.57–1.00)
GFR calc Af Amer: 120 mL/min/{1.73_m2} (ref >59)
GFR calc non Af Amer: 104 mL/min/{1.73_m2} (ref >59)
Globulin, Total: 2.2 g/dL (ref 1.5–4.5)
Glucose: 160 mg/dL — ABNORMAL HIGH (ref 65–99)
Potassium: 3.6 mmol/L (ref 3.5–5.2)
Sodium: 140 mmol/L (ref 134–144)
Total Protein: 6.3 g/dL (ref 6.0–8.5)

## 2017-02-10 LAB — CBC WITH DIFFERENTIAL
Basophils Absolute: 0 10*3/uL (ref 0.0–0.2)
Basos: 1 %
EOS (ABSOLUTE): 0.3 10*3/uL (ref 0.0–0.4)
Eos: 4 %
Hematocrit: 37.5 % (ref 34.0–46.6)
Hemoglobin: 12.3 g/dL (ref 11.1–15.9)
Immature Grans (Abs): 0 10*3/uL (ref 0.0–0.1)
Immature Granulocytes: 0 %
Lymphocytes Absolute: 2 10*3/uL (ref 0.7–3.1)
Lymphs: 25 %
MCH: 26.8 pg (ref 26.6–33.0)
MCHC: 32.8 g/dL (ref 31.5–35.7)
MCV: 82 fL (ref 79–97)
Monocytes Absolute: 0.4 10*3/uL (ref 0.1–0.9)
Monocytes: 5 %
Neutrophils Absolute: 5.1 10*3/uL (ref 1.4–7.0)
Neutrophils: 65 %
RBC: 4.59 x10E6/uL (ref 3.77–5.28)
RDW: 15.4 % (ref 12.3–15.4)
WBC: 7.8 10*3/uL (ref 3.4–10.8)

## 2017-02-10 LAB — LIPID PANEL
Chol/HDL Ratio: 6.2 ratio — ABNORMAL HIGH (ref 0.0–4.4)
Cholesterol, Total: 186 mg/dL (ref 100–199)
HDL: 30 mg/dL — ABNORMAL LOW (ref >39)
LDL Calculated: 83 mg/dL (ref 0–99)
Triglycerides: 365 mg/dL — ABNORMAL HIGH (ref 0–149)
VLDL Cholesterol Cal: 73 mg/dL — ABNORMAL HIGH (ref 5–40)

## 2017-02-10 LAB — HEMOGLOBIN A1C
Est. average glucose Bld gHb Est-mCnc: 189 mg/dL
Hgb A1c MFr Bld: 8.2 % — ABNORMAL HIGH (ref 4.8–5.6)

## 2017-02-17 ENCOUNTER — Ambulatory Visit: Payer: Self-pay | Admitting: Adult Health Nurse Practitioner

## 2017-02-17 VITALS — BP 126/87 | HR 91 | Temp 99.1°F | Wt 183.0 lb

## 2017-02-17 DIAGNOSIS — G4733 Obstructive sleep apnea (adult) (pediatric): Secondary | ICD-10-CM

## 2017-02-17 DIAGNOSIS — E119 Type 2 diabetes mellitus without complications: Secondary | ICD-10-CM

## 2017-02-17 LAB — GLUCOSE, POCT (MANUAL RESULT ENTRY): POC Glucose: 125 mg/dl — AB (ref 70–99)

## 2017-02-17 MED ORDER — GLIPIZIDE 10 MG PO TABS: 15.0000 mg | ORAL_TABLET | Freq: Every day | ORAL | 4 refills | Status: DC

## 2017-02-17 NOTE — Progress Notes (Signed)
Patient: Christine Cook Female    DOB: July 29, 1976   41 y.o.   MRN: [id number] Visit Date: 02/17/2017  Today's Provider: Staci Acosta, NP   No chief complaint on file.  Subjective:    HPI   Here for lab review.    Pt states that she is not taking Actos due to polyuria.  She took it for 2 weeks and then discontinued.  Unable to tolerate Metformin.  Current A1C 8.2.   Allergies  Allergen Reactions  . Codeine Hives and Nausea And Vomiting  . Morphine Hives  . Neurontin [Gabapentin] Other (See Comments)    syncope   Previous Medications   ARIPIPRAZOLE (ABILIFY) 2 MG TABLET    Take 2 mg by mouth daily.   ATORVASTATIN (LIPITOR) 20 MG TABLET    Take 1 tablet (20 mg total) by mouth daily.   CEPHALEXIN (KEFLEX) 500 MG CAPSULE    Take 1 capsule (500 mg total) by mouth 2 (two) times daily.   CHOLECALCIFEROL 400 UNITS TABLET    Take 1 tablet (400 Units total) by mouth daily.   CYCLOBENZAPRINE (FLEXERIL) 5 MG TABLET    Take 1 tablet (5 mg total) by mouth at bedtime as needed for muscle spasms.   FLUOXETINE (PROZAC) 40 MG CAPSULE    Take 1 capsule (40 mg total) by mouth daily.   GLIPIZIDE (GLUCOTROL) 5 MG TABLET    Take 1 tablet (5 mg total) by mouth 2 (two) times daily before a meal.   METOPROLOL TARTRATE (LOPRESSOR) 25 MG TABLET    Take 1 tablet (25 mg total) by mouth 2 (two) times daily.   ONDANSETRON (ZOFRAN) 4 MG TABLET    Take 1 tablet (4 mg total) by mouth every 8 (eight) hours as needed for nausea or vomiting.   PANTOPRAZOLE (PROTONIX) 40 MG TABLET    Take 1 tablet (40 mg total) by mouth 2 (two) times daily.   PIOGLITAZONE (ACTOS) 15 MG TABLET    Take 1 tablet (15 mg total) by mouth daily.   PROBIOTIC PRODUCT (ALIGN) 4 MG CAPS    Take 1 capsule by mouth daily.   QUETIAPINE (SEROQUEL) 50 MG TABLET    Take 3 tablets (150 mg total) by mouth at bedtime.   VENLAFAXINE (EFFEXOR) 75 MG TABLET    Take 1 tablet (75 mg total) by mouth 2 (two) times daily with a meal.   VITAMIN B-12 1000  MCG TABLET    Take 1 tablet (1,000 mcg total) by mouth daily.    Review of Systems  All other systems reviewed and are negative.   Social History  Substance Use Topics  . Smoking status: Former Smoker    Packs/day: 0.25    Years: 2.00    Types: Cigarettes    Quit date: 02/14/1995  . Smokeless tobacco: Never Used     Comment: trying to quit - nicotine patches   . Alcohol use No     Comment: occationally    Objective:   BP 126/87   Pulse 91   Temp 99.1 F (37.3 C)   Wt 183 lb (83 kg)   BMI 30.45 kg/m   Physical Exam  Constitutional: She appears well-developed and well-nourished.  HENT:  Head: Normocephalic and atraumatic.  Cardiovascular: Normal rate, regular rhythm and normal heart sounds.   Pulmonary/Chest: Effort normal and breath sounds normal.  Vitals reviewed.       Assessment & Plan:      Reviewed all labwork.  No questions or  concerns at this time.  Discussed need to use CPAP all night to help with daytime somnolence.   Discontinue Actos.  Increase Glipizide to 15mg  daily.  Stricter diet and exercise.  Discussed possibility of insulin or additional oral medications at next OV depending on A1C.        Staci Acosta, NP   [facility]

## 2017-02-22 ENCOUNTER — Telehealth: Payer: Self-pay | Admitting: Pharmacist

## 2017-02-22 NOTE — Telephone Encounter (Signed)
02/22/2017 Note in QS1 stating patient eligible till Feb. 2019 per [facility].

## 2017-03-03 ENCOUNTER — Telehealth: Payer: Self-pay | Admitting: Pharmacist

## 2017-03-03 NOTE — Telephone Encounter (Signed)
03/03/17 Faxed [employer] application for Abilify 10mg  Take one tablet by mouth every day.

## 2017-03-15 ENCOUNTER — Telehealth: Payer: Self-pay

## 2017-03-15 DIAGNOSIS — K219 Gastro-esophageal reflux disease without esophagitis: Secondary | ICD-10-CM

## 2017-03-15 MED ORDER — PANTOPRAZOLE SODIUM 40 MG PO TBEC: 40.0000 mg | DELAYED_RELEASE_TABLET | Freq: Two times a day (BID) | ORAL | 0 refills | Status: DC

## 2017-03-15 NOTE — Telephone Encounter (Signed)
rx sent to pharmacy.  Pt aware.  

## 2017-03-17 ENCOUNTER — Ambulatory Visit: Payer: Self-pay | Admitting: Pharmacist

## 2017-03-17 ENCOUNTER — Encounter: Payer: Self-pay | Admitting: Pharmacist

## 2017-03-17 VITALS — BP 142/78 | Wt 178.0 lb

## 2017-03-17 DIAGNOSIS — Z79899 Other long term (current) drug therapy: Secondary | ICD-10-CM

## 2017-03-17 NOTE — Progress Notes (Signed)
[facility] Visit Note  Patient: Christine Cook MRN: [id number] Date of Birth: 01-Jul-1976 PCP: Boyce Medici, FNP   Vonna Drafts 40 y.o. female presents for a f/u MTM visit today.  BP (!) 142/78   Wt 178 lb (80.7 kg)   BMI 29.62 kg/m   Patient Information   Past Medical History:  Diagnosis Date  . Anxiety   . Depression   . Diabetes ([place])   . Fatty liver disease, nonalcoholic   . Heart murmur   . Heart palpitations   . High cholesterol   . 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 Hypertension   . Irritable bowel syndrome with diarrhea   . 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 Migraine   . 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 Sleep apnea with hypersomnolence    CPAP study 11-28-09,  10-02-2009 AHI was 16.5, pressure to   . Tachycardia       Past Surgical History:  Procedure Laterality Date  . ADENOIDECTOMY    . BLADDER SURGERY    . CHOLECYSTECTOMY    . KNEE SURGERY     Left knee x 2   . TONSILLECTOMY       Family History  Problem Relation Age of Onset  . Asthma Mother   . Hypertension Father   . Coronary artery disease Father   . Diabetes Father   . Asthma Daughter   . Colon cancer Neg Hx     New Diagnoses (since last visit):   Family Support: daughter that she sees frequently  Lifestyle Diet: Breakfast: cereal bar, yougurt, oatmeal Lunch: chips, leftovers Dinner:shrimp tacos, corn tortillas; chicken veg;  Drinks: coke;             History  Alcohol Use No    Comment: occationally       History  Smoking Status  . Former Smoker  . Packs/day: 0.25  . Years: 2.00  . Types: Cigarettes  . Quit date: 02/14/1995  Smokeless Tobacco  . Never Used    Comment: trying to quit - nicotine patches       Health Maintenance  Topic Date Due  . PNEUMOCOCCAL POLYSACCHARIDE VACCINE (1) 06/02/1977  . FOOT EXAM  06/02/1985  . OPHTHALMOLOGY EXAM  06/02/1985  . HIV Screening  06/02/1990  . TETANUS/TDAP   06/02/1994  . PAP SMEAR  06/02/1996  . INFLUENZA VACCINE  05/04/2017  . HEMOGLOBIN A1C  08/12/2017  . URINE MICROALBUMIN  10/26/2017   Outpatient Encounter Prescriptions as of 03/17/2017  Medication Sig  . atorvastatin (LIPITOR) 20 MG tablet Take 1 tablet (20 mg total) by mouth daily.  . cholecalciferol 400 units tablet Take 1 tablet (400 Units total) by mouth daily.  Marland Kitchen FLUoxetine (PROZAC) 20 MG capsule Take 60 mg by mouth daily.  Marland Kitchen glipiZIDE (GLUCOTROL) 10 MG tablet Take 1.5 tablets (15 mg total) by mouth daily before breakfast.  . metoprolol tartrate (LOPRESSOR) 25 MG tablet Take 1 tablet (25 mg total) by mouth 2 (two) times daily.  . nicotine (NICODERM CQ - DOSED IN MG/24 HOURS) 14 mg/24hr patch Place 14 mg onto the skin daily.  . ondansetron (ZOFRAN) 4 MG tablet Take 1 tablet (4 mg total) by mouth every 8 (eight) hours as needed for nausea or vomiting.  . pantoprazole (PROTONIX) 40 MG tablet Take 1 tablet (40 mg total) by mouth 2 (two) times daily.  . QUEtiapine (SEROQUEL) 50 MG tablet Take 3 tablets (150 mg total)  by mouth at bedtime.  Marland Kitchen venlafaxine (EFFEXOR) 75 MG tablet Take 150 mg by mouth daily.  . vitamin B-12 1000 MCG tablet Take 1 tablet (1,000 mcg total) by mouth daily.  . [DISCONTINUED] ARIPiprazole (ABILIFY) 2 MG tablet Take 10 mg by mouth daily.  . [DISCONTINUED] Probiotic Product (ALIGN) 4 MG CAPS Take 1 capsule by mouth daily.   No facility-administered encounter medications on file as of 03/17/2017.     Assessment and Plan:  Compliance/Adherance: Very good. Rarely misses doses has alarm set on phone and is adherent with refills. Can state each medication 3 W's (what/when/why) without prompting.  Mental health meds; states several meds have been adjusted. Pt was recently started on abilify 2mg  and increased to 10mg  and then stopped due to SEs. She is currently on venlafaxine 150mg  qam and fluoxetine 60mg  pm. She states that her behavioral health visits and therapy visits  are going ok. She feels she is not quite where she wants to be but knows that figuring out medications takes time.  DM: recent A1c 8.2. Discussed goal of 6.0. Taking Glipizide 15mg  qam. S/w pt about prior intolerance to metformin and consider adding metformin XR. She states she took this in the past and can tolerate the XR form (doesn't cause as intense stomach upset). She states this would be preferrable over insulin.   Currently Checks BG a few times a week. Runs 150-250. Encouraged goal to work up to 2x/day and keep a log. Discussed making dietary changes such as cutting back on sugar, cabs, cereal and ice cream. Encouraged pt to check labels and limit servings to 9gm of sugar or less. Encouraged changes in small increments such as cutting back [employer] to 2 x/day, ice cream 2x/week. Encouraged making small changes every two weeks.   HTN: BP today was 142/78. Almost within goal of 140/90.  Netta Neat, PharmD, [facility]) [phone]

## 2017-03-24 ENCOUNTER — Other Ambulatory Visit: Payer: Self-pay | Admitting: Urology

## 2017-03-24 MED ORDER — METFORMIN HCL ER 500 MG PO TB24: 500.0000 mg | ORAL_TABLET | Freq: Every day | ORAL | 0 refills | Status: DC

## 2017-03-25 ENCOUNTER — Telehealth: Payer: Self-pay | Admitting: Pharmacist

## 2017-03-25 NOTE — Telephone Encounter (Signed)
1/95/97 Faxed [employer] application for enrollment- Prozac 20mg  Take 3 capsules by mouth every day.Delos Haring

## 2017-03-29 ENCOUNTER — Other Ambulatory Visit: Payer: Self-pay | Admitting: Adult Health Nurse Practitioner

## 2017-05-11 ENCOUNTER — Other Ambulatory Visit: Payer: Self-pay

## 2017-05-11 DIAGNOSIS — E119 Type 2 diabetes mellitus without complications: Secondary | ICD-10-CM

## 2017-05-12 LAB — COMPREHENSIVE METABOLIC PANEL
ALT: 17 IU/L (ref 0–32)
AST: 16 IU/L (ref 0–40)
Albumin/Globulin Ratio: 2.2 (ref 1.2–2.2)
Albumin: 4.2 g/dL (ref 3.5–5.5)
Alkaline Phosphatase: 92 IU/L (ref 39–117)
BUN/Creatinine Ratio: 8 — ABNORMAL LOW (ref 9–23)
BUN: 6 mg/dL (ref 6–24)
Bilirubin Total: 0.4 mg/dL (ref 0.0–1.2)
CO2: 26 mmol/L (ref 20–29)
Calcium: 8.9 mg/dL (ref 8.7–10.2)
Chloride: 100 mmol/L (ref 96–106)
Creatinine, Ser: 0.73 mg/dL (ref 0.57–1.00)
GFR calc Af Amer: 118 mL/min/{1.73_m2} (ref >59)
GFR calc non Af Amer: 103 mL/min/{1.73_m2} (ref >59)
Globulin, Total: 1.9 g/dL (ref 1.5–4.5)
Glucose: 141 mg/dL — ABNORMAL HIGH (ref 65–99)
Potassium: 3.4 mmol/L — ABNORMAL LOW (ref 3.5–5.2)
Sodium: 143 mmol/L (ref 134–144)
Total Protein: 6.1 g/dL (ref 6.0–8.5)

## 2017-05-12 LAB — HEMOGLOBIN A1C
Est. average glucose Bld gHb Est-mCnc: 171 mg/dL
Hgb A1c MFr Bld: 7.6 % — ABNORMAL HIGH (ref 4.8–5.6)

## 2017-05-17 ENCOUNTER — Ambulatory Visit: Payer: Self-pay | Admitting: Family Medicine

## 2017-05-17 VITALS — BP 120/73 | HR 78 | Temp 98.8°F | Ht 65.0 in | Wt 181.3 lb

## 2017-05-17 DIAGNOSIS — I10 Essential (primary) hypertension: Secondary | ICD-10-CM

## 2017-05-17 DIAGNOSIS — R000 Tachycardia, unspecified: Secondary | ICD-10-CM

## 2017-05-17 DIAGNOSIS — E119 Type 2 diabetes mellitus without complications: Secondary | ICD-10-CM

## 2017-05-17 DIAGNOSIS — K219 Gastro-esophageal reflux disease without esophagitis: Secondary | ICD-10-CM

## 2017-05-17 DIAGNOSIS — E785 Hyperlipidemia, unspecified: Secondary | ICD-10-CM

## 2017-05-17 LAB — GLUCOSE, POCT (MANUAL RESULT ENTRY): POC Glucose: 128 mg/dl — AB (ref 70–99)

## 2017-05-17 MED ORDER — GLIPIZIDE ER 10 MG PO TB24: 10.0000 mg | ORAL_TABLET | Freq: Every day | ORAL | 0 refills | Status: DC

## 2017-05-17 MED ORDER — METOPROLOL TARTRATE 25 MG PO TABS: 25.0000 mg | ORAL_TABLET | Freq: Two times a day (BID) | ORAL | 0 refills | Status: DC

## 2017-05-17 MED ORDER — ATORVASTATIN CALCIUM 20 MG PO TABS: 20.0000 mg | ORAL_TABLET | Freq: Every day | ORAL | 0 refills | Status: DC

## 2017-05-17 NOTE — Progress Notes (Addendum)
Name: Christine Cook   MRN: [id number]    DOB: 18-Feb-1976   Date:05/17/2017       Progress Note  Subjective  Chief Complaint  Chief Complaint  Patient presents with  . Follow-up    HPI   Diabetes Mellitus: Most recent A1C was 7.6% (05/11/2017), down from 8.2% 3 months ago.  Has been going for more walks, eating more vegetables, decreasing her intake of ice cream, avoiding fried and fatty foods. She eats a lot of baked/crock pot chicken breasts etc.  Not taking metformin - had diarrhea and couldn't tolerate.  She has experienced a few episodes of late afternoonhypoglycemic episodes - feeling a little shaky/sweaty - only happens if she had a light lunch and no afternoon snacks. Checks BG's at home a few times a week - lowest was 73, highest was 250, average - 150's.  No polydipsia, or polyuria; endorses chronic polyphagia and fatigue. Endorses intermittent numbness and tingling in Bilateral feet daily - worst pain 7-8/10 - did not tolerate gabapentin in the past.  Still drinking soda and sweet tea.  Not taking ACE or ARB - recent urine micro January 2018 was stable. Taking Statin.  HTN: 130/70's at home usually - checks at home maybe once a week at most.  BP at goal today. Endorses daily mild BLE swelling that goes away at night. No shortness of breath, chest pain, blurred vision.  Endorses headaches - uses goody powder PRN and that usually works.  Takes Metoprolol 66m BID - was taking lisinopril and was switched to metoprolol for better BP control and increased heart rate (no recent episodes of tachycardia) - we will maintain this regimen today.  Hyperlipidemia: Last lipid panel 02/09/2017, showed hypertriglyceridemia and low HDL, however LDL was at goal. .Discussed adding fish and vegetables to diet, continue to exercise and walk.  Discussed signs and symptoms of pancreatitis and need for emergent care if symptoms occur.  We will maintain atorvastatin 249mtoday.  Encouraged her to quit smoking, not  ready yet.  GERD: Most of the time BID protonix controls the heartburn - sometimes wakes her in the middle of the night with sharp pain. When she has breakthrough pain she will take tums or zantac and this controls it. She avoids fried foods, does drink sweet tea a couple of times a week and regular sodas (Coke) a few times a day - she hates water but agrees to try to cut back on these beverages.   Obesity: Has been losing weight, BMI down to 30.17 today. See above for dietary and exercise changes. Encouraged continuation of lifestyle changes and stopping sweet beverages.  Patient Active Problem List   Diagnosis Date Noted  . Flushing 12/09/2016  . Nausea 12/09/2016  . Acute left-sided low back pain without sciatica 11/04/2016  . Abscess of skin and subcutaneous tissue 10/12/2016  . Hyperlipidemia 08/05/2016  . Tachycardia 08/05/2016  . OSA (obstructive sleep apnea) 07/12/2016  . 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 Vitamin B12 deficiency 07/12/2016  . Vitamin D deficiency 07/12/2016  . Diabetes mellitus without complication ([id number]. 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 Impulse control disorder (gambling) 07/11/2016  . Hypertension 07/09/2016  . Benzodiazepine overdose 07/09/2016  . GERD 01/21/2009    Past Surgical History:  Procedure Laterality Date  . ADENOIDECTOMY    . BLADDER SURGERY    . CHOLECYSTECTOMY    . KNEE SURGERY     Left knee x 2   . TONSILLECTOMY  Family History  Problem Relation Age of Onset  . Asthma Mother   . Hypertension Father   . Coronary artery disease Father   . Diabetes Father   . Asthma Daughter   . Colon cancer Neg Hx     Social History   Social History  . Marital status: Single    Spouse name: N/A  . Number of children: 1  . Years of education: College   Occupational History  . Support specialist   .  [employer]   Social History Main Topics  . Smoking status: Current Every Day Smoker    Packs/day: 0.25    Years:  2.00    Types: Cigarettes    Last attempt to quit: 02/14/1995  . Smokeless tobacco: Never Used  . Alcohol use No     Comment: occationally   . Drug use: No  . Sexual activity: No   Other Topics Concern  . Not on file   Social History Narrative   Patient is single and lives alone.   Patient works at [employer].   Patient has a college education.   Patient has one child.   Patient is right-handed.   Patient drinks two 20 oz of Coke daily.     Current Outpatient Prescriptions:  .  atorvastatin (LIPITOR) 20 MG tablet, Take 1 tablet (20 mg total) by mouth daily., Disp: 90 tablet, Rfl: 0 .  cholecalciferol 400 units tablet, Take 1 tablet (400 Units total) by mouth daily., Disp: 30 each, Rfl: 0 .  FLUoxetine (PROZAC) 20 MG capsule, Take 60 mg by mouth daily., Disp: , Rfl:  .  metoprolol tartrate (LOPRESSOR) 25 MG tablet, Take 1 tablet (25 mg total) by mouth 2 (two) times daily., Disp: 180 tablet, Rfl: 0 .  ondansetron (ZOFRAN) 4 MG tablet, Take 1 tablet (4 mg total) by mouth every 8 (eight) hours as needed for nausea or vomiting., Disp: 20 tablet, Rfl: 0 .  pantoprazole (PROTONIX) 40 MG tablet, Take 1 tablet (40 mg total) by mouth 2 (two) times daily., Disp: 180 tablet, Rfl: 0 .  QUEtiapine (SEROQUEL) 50 MG tablet, Take 3 tablets (150 mg total) by mouth at bedtime., Disp: 30 tablet, Rfl: 0 .  venlafaxine (EFFEXOR) 75 MG tablet, Take 150 mg by mouth daily., Disp: , Rfl:  .  vitamin B-12 1000 MCG tablet, Take 1 tablet (1,000 mcg total) by mouth daily., Disp: 30 tablet, Rfl:  .  glipiZIDE (GLIPIZIDE XL) 10 MG 24 hr tablet, Take 1 tablet (10 mg total) by mouth daily with breakfast., Disp: 90 tablet, Rfl: 0  Allergies  Allergen Reactions  . Codeine Hives and Nausea And Vomiting  . Morphine Hives  . Neurontin [Gabapentin] Other (See Comments)    syncope     ROS  Ten systems reviewed and is negative except as mentioned in HPI  Objective  Vitals:   05/17/17 1810  BP: 120/73  Pulse: 78   Temp: 98.8 F (37.1 C)  Weight: 181 lb 4.8 oz (82.2 kg)  Height: _0  (1.651 m)   Body mass index is 30.17 kg/m.  Physical Exam Constitutional: Patient appears well-developed and well-nourished. Obese. No distress.  HEENT: head atraumatic, normocephalic Cardiovascular: Normal rate, regular rhythm and normal heart sounds.  No murmur heard. No BLE edema. Pulmonary/Chest: Effort normal and breath sounds normal. No respiratory distress. Abdominal: Soft.  There is no tenderness. Psychiatric: Patient has a normal mood and affect. behavior is normal. Judgment and thought content normal.   Recent  Results (from the past 2160 hour(s))  POCT Glucose (CBG)     Status: Abnormal   Collection Time: 02/17/17  6:15 PM  Result Value Ref Range   POC Glucose 125 (A) 70 - 99 mg/dl  Comp Met (CMET)     Status: Abnormal   Collection Time: 05/11/17  9:40 AM  Result Value Ref Range   Glucose 141 (H) 65 - 99 mg/dL   BUN 6 6 - 24 mg/dL   Creatinine, Ser 0.73 0.57 - 1.00 mg/dL   GFR calc non Af Amer 103 >59 mL/min/1.73   GFR calc Af Amer 118 >59 mL/min/1.73   BUN/Creatinine Ratio 8 (L) 9 - 23   Sodium 143 134 - 144 mmol/L   Potassium 3.4 (L) 3.5 - 5.2 mmol/L   Chloride 100 96 - 106 mmol/L   CO2 26 20 - 29 mmol/L   Calcium 8.9 8.7 - 10.2 mg/dL   Total Protein 6.1 6.0 - 8.5 g/dL   Albumin 4.2 3.5 - 5.5 g/dL   Globulin, Total 1.9 1.5 - 4.5 g/dL   Albumin/Globulin Ratio 2.2 1.2 - 2.2   Bilirubin Total 0.4 0.0 - 1.2 mg/dL   Alkaline Phosphatase 92 39 - 117 IU/L   AST 16 0 - 40 IU/L   ALT 17 0 - 32 IU/L  Hemoglobin A1c     Status: Abnormal   Collection Time: 05/11/17  9:40 AM  Result Value Ref Range   Hgb A1c MFr Bld 7.6 (H) 4.8 - 5.6 %    Comment:          Pre-diabetes: 5.7 - 6.4          Diabetes: >6.4          Glycemic control for adults with diabetes: <7.0    Est. average glucose Bld gHb Est-mCnc 171 mg/dL  POCT Glucose (CBG)     Status: Abnormal   Collection Time: 05/17/17  6:21 PM   Result Value Ref Range   POC Glucose 128 (A) 70 - 99 mg/dl    Diabetic Foot Exam: Diabetic Foot Exam - Simple   Simple Foot Form Diabetic Foot exam was performed with the following findings:  Yes 05/17/2017  7:00 PM  Visual Inspection Sensation Testing Pulse Check Comments    Assessment & Plan  1. Diabetes mellitus without complication (HCC) - POCT Glucose (CBG) - glipiZIDE (GLIPIZIDE XL) 10 MG 24 hr tablet; Take 1 tablet (10 mg total) by mouth daily with breakfast.  Dispense: 90 tablet; Refill: 0 - Lifestyle modifications - stop sweet beverages.  2. Gastroesophageal reflux disease without esophagitis - Continue Protonix, PRN Tums or Zantac. Low acid diet discussed.  Quit smoking  3. Essential hypertension - metoprolol tartrate (LOPRESSOR) 25 MG tablet; Take 1 tablet (25 mg total) by mouth 2 (two) times daily.  Dispense: 180 tablet; Refill: 0 - Quit smoking  4. Hyperlipidemia, unspecified hyperlipidemia type - atorvastatin (LIPITOR) 20 MG tablet; Take 1 tablet (20 mg total) by mouth daily.  Dispense: 90 tablet; Refill: 0 - Lifestyle modifications. Quit smoking  5. Tachycardia - metoprolol tartrate (LOPRESSOR) 25 MG tablet; Take 1 tablet (25 mg total) by mouth 2 (two) times daily.  Dispense: 180 tablet; Refill: 0  I have reviewed this encounter including the documentation in this note and/or discussed this patient with the Johney Maine, FNP, NP-C. I am certifying that I agree with the content of this note as supervising physician.  Steele Sizer, MD [facility] 05/18/2017, 1:02  PM

## 2017-05-18 ENCOUNTER — Other Ambulatory Visit: Payer: Self-pay | Admitting: Family Medicine

## 2017-05-18 DIAGNOSIS — E119 Type 2 diabetes mellitus without complications: Secondary | ICD-10-CM

## 2017-05-18 MED ORDER — GLIPIZIDE 5 MG PO TABS
5.0000 mg | ORAL_TABLET | Freq: Two times a day (BID) | ORAL | 2 refills | Status: DC
Start: 2017-05-18 — End: 2017-05-19

## 2017-05-18 MED ORDER — GLIPIZIDE 5 MG PO TABS: 5.0000 mg | ORAL_TABLET | Freq: Two times a day (BID) | ORAL | 2 refills | Status: DC

## 2017-05-18 NOTE — Progress Notes (Unsigned)
Glipizide 10mg  XL not on formulary at medication management. Changed to 5mg  BID dosing. Metformin not started due to patient intolerance.

## 2017-05-19 ENCOUNTER — Other Ambulatory Visit: Payer: Self-pay | Admitting: Urology

## 2017-05-19 DIAGNOSIS — E119 Type 2 diabetes mellitus without complications: Secondary | ICD-10-CM

## 2017-05-19 MED ORDER — GLIPIZIDE 5 MG PO TABS: 5.0000 mg | ORAL_TABLET | Freq: Two times a day (BID) | ORAL | 2 refills | Status: DC

## 2017-05-19 MED ORDER — METFORMIN HCL ER 500 MG PO TB24: 500.0000 mg | ORAL_TABLET | Freq: Every day | ORAL | 0 refills | Status: DC

## 2017-06-03 ENCOUNTER — Encounter: Payer: Self-pay | Admitting: Emergency Medicine

## 2017-06-03 DIAGNOSIS — N390 Urinary tract infection, site not specified: Secondary | ICD-10-CM | POA: Diagnosis not present

## 2017-06-03 DIAGNOSIS — Z7984 Long term (current) use of oral hypoglycemic drugs: Secondary | ICD-10-CM | POA: Insufficient documentation

## 2017-06-03 DIAGNOSIS — E876 Hypokalemia: Secondary | ICD-10-CM | POA: Insufficient documentation

## 2017-06-03 DIAGNOSIS — F17210 Nicotine dependence, cigarettes, uncomplicated: Secondary | ICD-10-CM | POA: Diagnosis not present

## 2017-06-03 DIAGNOSIS — R197 Diarrhea, unspecified: Secondary | ICD-10-CM | POA: Insufficient documentation

## 2017-06-03 DIAGNOSIS — R109 Unspecified abdominal pain: Secondary | ICD-10-CM | POA: Diagnosis not present

## 2017-06-03 DIAGNOSIS — R1084 Generalized abdominal pain: Secondary | ICD-10-CM | POA: Diagnosis not present

## 2017-06-03 DIAGNOSIS — I10 Essential (primary) hypertension: Secondary | ICD-10-CM | POA: Insufficient documentation

## 2017-06-03 DIAGNOSIS — E119 Type 2 diabetes mellitus without complications: Secondary | ICD-10-CM | POA: Diagnosis not present

## 2017-06-03 DIAGNOSIS — R112 Nausea with vomiting, unspecified: Secondary | ICD-10-CM | POA: Insufficient documentation

## 2017-06-03 DIAGNOSIS — Z79899 Other long term (current) drug therapy: Secondary | ICD-10-CM | POA: Insufficient documentation

## 2017-06-03 LAB — COMPREHENSIVE METABOLIC PANEL
ALT: 29 U/L (ref 14–54)
AST: 34 U/L (ref 15–41)
Albumin: 4.3 g/dL (ref 3.5–5.0)
Alkaline Phosphatase: 75 U/L (ref 38–126)
Anion gap: 13 (ref 5–15)
BUN: 9 mg/dL (ref 6–20)
CO2: 27 mmol/L (ref 22–32)
Calcium: 9.4 mg/dL (ref 8.9–10.3)
Chloride: 99 mmol/L — ABNORMAL LOW (ref 101–111)
Creatinine, Ser: 0.84 mg/dL (ref 0.44–1.00)
GFR calc Af Amer: >60 mL/min (ref >60)
GFR calc non Af Amer: >60 mL/min (ref >60)
Glucose, Bld: 201 mg/dL — ABNORMAL HIGH (ref 65–99)
Potassium: 2.8 mmol/L — ABNORMAL LOW (ref 3.5–5.1)
Sodium: 139 mmol/L (ref 135–145)
Total Bilirubin: 0.7 mg/dL (ref 0.3–1.2)
Total Protein: 7.3 g/dL (ref 6.5–8.1)

## 2017-06-03 LAB — URINALYSIS, COMPLETE (UACMP) WITH MICROSCOPIC
Bilirubin Urine: NEGATIVE
Glucose, UA: NEGATIVE mg/dL
Ketones, ur: NEGATIVE mg/dL
Nitrite: NEGATIVE
Protein, ur: 30 mg/dL — AB
Specific Gravity, Urine: 1.025 (ref 1.005–1.030)
pH: 5 (ref 5.0–8.0)

## 2017-06-03 LAB — CBC
HCT: 39.5 % (ref 35.0–47.0)
Hemoglobin: 13.4 g/dL (ref 12.0–16.0)
MCH: 27.5 pg (ref 26.0–34.0)
MCHC: 33.9 g/dL (ref 32.0–36.0)
MCV: 81.1 fL (ref 80.0–100.0)
Platelets: 304 10*3/uL (ref 150–440)
RBC: 4.88 MIL/uL (ref 3.80–5.20)
RDW: 16 % — ABNORMAL HIGH (ref 11.5–14.5)
WBC: 10.4 10*3/uL (ref 3.6–11.0)

## 2017-06-03 LAB — POCT PREGNANCY, URINE: Preg Test, Ur: NEGATIVE

## 2017-06-03 LAB — AMMONIA: Ammonia: 18 umol/L (ref 9–35)

## 2017-06-03 LAB — LIPASE, BLOOD: Lipase: 25 U/L (ref 11–51)

## 2017-06-03 NOTE — ED Triage Notes (Signed)
Pt ambulatory to triage with steady gait, no distress noted. Pt c/o left lower abdominal pain that radiates into back as well as N/V/D x1 week since starting Lamictal, prescribed by [facility]. Pt has HX of liver disease and expresses concern of new medication effects on liver.

## 2017-06-04 ENCOUNTER — Emergency Department
Admission: EM | Admit: 2017-06-04 | Discharge: 2017-06-04 | Disposition: A | Discharge status: Home / Self Care | Payer: 59 | Attending: Emergency Medicine | Admitting: Emergency Medicine

## 2017-06-04 ENCOUNTER — Emergency Department: Payer: 59

## 2017-06-04 DIAGNOSIS — R109 Unspecified abdominal pain: Secondary | ICD-10-CM

## 2017-06-04 DIAGNOSIS — R197 Diarrhea, unspecified: Secondary | ICD-10-CM

## 2017-06-04 DIAGNOSIS — E876 Hypokalemia: Secondary | ICD-10-CM

## 2017-06-04 DIAGNOSIS — R1084 Generalized abdominal pain: Secondary | ICD-10-CM

## 2017-06-04 DIAGNOSIS — N390 Urinary tract infection, site not specified: Secondary | ICD-10-CM

## 2017-06-04 DIAGNOSIS — R112 Nausea with vomiting, unspecified: Secondary | ICD-10-CM

## 2017-06-04 MED ORDER — FOSFOMYCIN TROMETHAMINE 3 G PO PACK
3.0000 g | PACK | Freq: Once | ORAL | Status: AC
  Administered 2017-06-04: 3 g via ORAL
  Filled 2017-06-04: qty 3

## 2017-06-04 MED ORDER — POTASSIUM CHLORIDE CRYS ER 20 MEQ PO TBCR
40.0000 meq | EXTENDED_RELEASE_TABLET | Freq: Once | ORAL | Status: AC
  Administered 2017-06-04: 40 meq via ORAL
  Filled 2017-06-04: qty 2

## 2017-06-04 MED ORDER — ONDANSETRON HCL 4 MG/2ML IJ SOLN
INTRAMUSCULAR | Status: AC
  Filled 2017-06-04: qty 2

## 2017-06-04 MED ORDER — SODIUM CHLORIDE 0.9 % IV BOLUS (SEPSIS)
1000.0000 mL | Freq: Once | INTRAVENOUS | Status: AC
  Administered 2017-06-04: 1000 mL via INTRAVENOUS

## 2017-06-04 MED ORDER — IOPAMIDOL (ISOVUE-300) INJECTION 61%
100.0000 mL | Freq: Once | INTRAVENOUS | Status: AC | PRN
  Administered 2017-06-04: 100 mL via INTRAVENOUS

## 2017-06-04 MED ORDER — IOPAMIDOL (ISOVUE-300) INJECTION 61%
30.0000 mL | Freq: Once | INTRAVENOUS | Status: AC
  Administered 2017-06-04: 30 mL via ORAL

## 2017-06-04 MED ORDER — DIPHENHYDRAMINE HCL 50 MG/ML IJ SOLN
25.0000 mg | Freq: Once | INTRAMUSCULAR | Status: AC
  Administered 2017-06-04: 25 mg via INTRAVENOUS
  Filled 2017-06-04: qty 1

## 2017-06-04 MED ORDER — ONDANSETRON HCL 4 MG/2ML IJ SOLN
4.0000 mg | Freq: Once | INTRAMUSCULAR | Status: AC
  Administered 2017-06-04: 4 mg via INTRAVENOUS

## 2017-06-04 MED ORDER — HYDROMORPHONE HCL 1 MG/ML IJ SOLN
0.5000 mg | Freq: Once | INTRAMUSCULAR | Status: AC
  Administered 2017-06-04: 0.5 mg via INTRAVENOUS
  Filled 2017-06-04: qty 1

## 2017-06-04 MED ORDER — ONDANSETRON 4 MG PO TBDP: 4.0000 mg | ORAL_TABLET | Freq: Three times a day (TID) | ORAL | 0 refills | Status: DC | PRN

## 2017-06-04 NOTE — ED Notes (Signed)
Pt states left flank pain that began a week ago with low grade fever, diarrhea and nausea/vomiting. Pt states she now has bilateral lower quadrant into pelvic pain. Pt with normal color warm and dry skin. resps unlabored. Pt states highest temp has been at home is 99 degrees.

## 2017-06-04 NOTE — ED Notes (Signed)
Patient returned from CT

## 2017-06-04 NOTE — ED Notes (Signed)
Pt assisted up to commode to void before ct scan.

## 2017-06-04 NOTE — Discharge Instructions (Signed)
1. You may take nausea medicine as needed (Zofran). 2. Clear liquids x 12 hours, then BRAT diet x 3 days, then slowly advance diet as tolerated. 3. Return to the ER for worsening symptoms, persistent vomiting, difficulty breathing or other concerns.

## 2017-06-04 NOTE — ED Provider Notes (Signed)
[facility] Emergency Department Provider Note   ____________________________________________   First MD Initiated Contact with Patient 06/04/17 0014     (approximate)  I have reviewed the triage vital signs and the nursing notes.   HISTORY  Chief Complaint Flank Pain    HPI Christine Cook is a 41 y.o. female who presents to the ED from home with a chief complaint of abdominal pain, flank pain, nausea/vomiting/diarrhea.she reports a history of IBS who started Lamictal last week. Reports a one- right lower quadrant abdominal pain, . Symptoms associated with nausea/vomiting/diarrhea. Denies associated fever, chills, chest pain, shortness of breath, dysuria, vaginal bleeding or discharge. Last sexual intercourse 2 years ago. Patient is not concerned for STDs. Denies recent travel or trauma. Nothing makes her symptoms better or worse.   Past Medical History:  Diagnosis Date  . Anxiety   . Depression   . Diabetes ([place])   . Fatty liver disease, nonalcoholic   . Heart murmur   . Heart palpitations   . High cholesterol   . 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 Hypertension   . Irritable bowel syndrome with diarrhea   . 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 Migraine   . 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 Sleep apnea with hypersomnolence    CPAP study 11-28-09,  10-02-2009 AHI was 16.5, pressure to   . Tachycardia     Patient Active Problem List   Diagnosis Date Noted  . Flushing 12/09/2016  . Nausea 12/09/2016  . Acute left-sided low back pain without sciatica 11/04/2016  . Abscess of skin and subcutaneous tissue 10/12/2016  . Hyperlipidemia 08/05/2016  . Tachycardia 08/05/2016  . OSA (obstructive sleep apnea) 07/12/2016  . 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 Vitamin B12 deficiency 07/12/2016  . Vitamin D deficiency 07/12/2016  . Diabetes mellitus without complication ([place]) 94/85/4627  . MDD (major depressive disorder)  07/11/2016  . Impulse control disorder (gambling) 07/11/2016  . Hypertension 07/09/2016  . Benzodiazepine overdose 07/09/2016  . GERD 01/21/2009    Past Surgical History:  Procedure Laterality Date  . ADENOIDECTOMY    . BLADDER SURGERY    . CHOLECYSTECTOMY    . KNEE SURGERY     Left knee x 2   . TONSILLECTOMY      Prior to Admission medications   Medication Sig Start Date End Date Taking? Authorizing Provider  atorvastatin (LIPITOR) 20 MG tablet Take 1 tablet (20 mg total) by mouth daily. 05/17/17   Hubbard Hartshorn, FNP  cholecalciferol 400 units tablet Take 1 tablet (400 Units total) by mouth daily. 07/20/16   Hildred Priest, MD  FLUoxetine (PROZAC) 20 MG capsule Take 60 mg by mouth daily.    Ahluwalia, Leatrice Jewels, MD  glipiZIDE (GLUCOTROL) 5 MG tablet Take 1 tablet (5 mg total) by mouth 2 (two) times daily before a meal. 05/19/17   McGowan, Larene Beach A, PA-C  metFORMIN (GLUCOPHAGE XR) 500 MG 24 hr tablet Take 1 tablet (500 mg total) by mouth daily with breakfast. 05/19/17   Ernestine Conrad, Larene Beach A, PA-C  metoprolol tartrate (LOPRESSOR) 25 MG tablet Take 1 tablet (25 mg total) by mouth 2 (two) times daily. 05/17/17   Hubbard Hartshorn, FNP  ondansetron (ZOFRAN ODT) 4 MG disintegrating tablet Take 1 tablet (4 mg total) by mouth every 8 (eight) hours as needed for nausea or vomiting. 06/04/17   Paulette Blanch, MD  ondansetron (ZOFRAN) 4 MG tablet Take 1 tablet (4 mg total)  by mouth every 8 (eight) hours as needed for nausea or vomiting. 11/25/16   Doles-Johnson, Teah, NP  pantoprazole (PROTONIX) 40 MG tablet Take 1 tablet (40 mg total) by mouth 2 (two) times daily. 03/15/17   Zara Council A, PA-C  QUEtiapine (SEROQUEL) 50 MG tablet Take 3 tablets (150 mg total) by mouth at bedtime. 07/19/16   Hildred Priest, MD  venlafaxine (EFFEXOR) 75 MG tablet Take 150 mg by mouth daily.    [provider]  vitamin B-12 1000 MCG tablet Take 1 tablet (1,000 mcg total) by mouth daily.  07/19/16   Hildred Priest, MD    Allergies Codeine; Morphine; and Neurontin [gabapentin]  Family History  Problem Relation Age of Onset  . Asthma Mother   . Hypertension Father   . Coronary artery disease Father   . Diabetes Father   . Asthma Daughter   . Colon cancer Neg Hx     Social History Social History  Substance Use Topics  . Smoking status: Current Every Day Smoker    Packs/day: 0.25    Years: 2.00    Types: Cigarettes    Last attempt to quit: 02/14/1995  . Smokeless tobacco: Never Used  . Alcohol use No     Comment: occationally     Review of Systems  Constitutional: No fever/chills. Eyes: No visual changes. ENT: No sore throat. Cardiovascular: Denies chest pain. Respiratory: Denies shortness of breath. Gastrointestinal: Positive for abdominal pain, nausea, vomiting and diarrhea.  No constipation. Positive for left flank pain. Genitourinary: Negative for dysuria. Musculoskeletal: Negative for back pain. Skin: Negative for rash. Neurological: Negative for headaches, focal weakness or numbness.   ____________________________________________   PHYSICAL EXAM:  VITAL SIGNS: ED Triage Vitals  Enc Vitals Group     BP 06/03/17 1943 124/78     Pulse Rate 06/03/17 1943 90     Resp 06/03/17 1943 16     Temp 06/03/17 1943 98.8 F (37.1 C)     Temp src --      SpO2 06/03/17 1943 99 %     Weight 06/03/17 1938 180 lb (81.6 kg)     Height --      Head Circumference --      Peak Flow --      Pain Score --      Pain Loc --      Pain Edu? --      Excl. in [place]? --     Constitutional: Alert and oriented. Well appearing and in mild acute distress. Eyes: Conjunctivae are normal. PERRL. EOMI. Head: Atraumatic. Nose: No congestion/rhinnorhea. Mouth/Throat: Mucous membranes are moist.  Oropharynx non-erythematous. Neck: No stridor.   Cardiovascular: Normal rate, regular rhythm. Grossly normal heart sounds.  Good peripheral circulation. Respiratory:  Normal respiratory effort.  No retractions. Lungs CTAB. Gastrointestinal: Soft and mildly diffusely tender to palpation without rebound or guarding. No distention. No abdominal bruits. No CVA tenderness. Musculoskeletal: No lower extremity tenderness nor edema.  No joint effusions. Neurologic:  Normal speech and language. No gross focal neurologic deficits are appreciated. No gait instability. Skin:  Skin is warm, dry and intact. No rash noted. Psychiatric: Mood and affect are normal. Speech and behavior are normal.  ____________________________________________   LABS (all labs ordered are listed, but only abnormal results are displayed)  Labs Reviewed  COMPREHENSIVE METABOLIC PANEL - Abnormal; Notable for the following:       Result Value   Potassium 2.8 (*)    Chloride 99 (*)  Glucose, Bld 201 (*)    All other components within normal limits  CBC - Abnormal; Notable for the following:    RDW 16.0 (*)    All other components within normal limits  URINALYSIS, COMPLETE (UACMP) WITH MICROSCOPIC - Abnormal; Notable for the following:    Color, Urine YELLOW (*)    APPearance HAZY (*)    Hgb urine dipstick MODERATE (*)    Protein, ur 30 (*)    Leukocytes, UA TRACE (*)    Bacteria, UA RARE (*)    Squamous Epithelial / LPF 6-30 (*)    All other components within normal limits  LIPASE, BLOOD  AMMONIA  POC URINE PREG, ED  POCT PREGNANCY, URINE   ____________________________________________  EKG  None ____________________________________________  RADIOLOGY  Ct Abdomen Pelvis W Contrast  Result Date: 06/04/2017 CLINICAL DATA:  Left lower abdominal pain history of liver disease EXAM: CT ABDOMEN AND PELVIS WITH CONTRAST TECHNIQUE: Multidetector CT imaging of the abdomen and pelvis was performed using the standard protocol following bolus administration of intravenous contrast. CONTRAST:  176mL ISOVUE-300 IOPAMIDOL (ISOVUE-300) INJECTION 61% COMPARISON:  12/20/2013 FINDINGS: Lower  chest: No acute abnormality. Hepatobiliary: No focal liver abnormality is seen. Status post cholecystectomy. No biliary dilatation. Pancreas: Unremarkable. No pancreatic ductal dilatation or surrounding inflammatory changes. Spleen: Splenic granulomas Adrenals/Urinary Tract: Adrenal glands are unremarkable. Kidneys are normal, without renal calculi, focal lesion, or hydronephrosis. Bladder is unremarkable. Stomach/Bowel: Stomach is within normal limits. Appendix appears normal. No evidence of bowel wall thickening, distention, or inflammatory changes. Vascular/Lymphatic: No significant vascular findings are present. No enlarged abdominal or pelvic lymph nodes. Reproductive: An intrauterine device is present.  No adnexal masses. Other: No abdominal wall hernia or abnormality. No abdominopelvic ascites. Musculoskeletal: No acute or significant osseous findings. IMPRESSION: No CT evidence for acute intra-abdominal or pelvic pathology. Electronically Signed   By: Donavan Foil M.D.   On: 06/04/2017 02:56    ____________________________________________   PROCEDURES  Procedure(s) performed: None  Procedures  Critical Care performed: No  ____________________________________________   INITIAL IMPRESSION / ASSESSMENT AND PLAN / ED COURSE  Pertinent labs & imaging results that were available during my care of the patient were reviewed by me and considered in my medical decision making (see chart for details).  41 year old female who presents with generalized abdominal pain, N/V/D. Laboratory urinalysis results remarkable for trace UTI. We will initiate IV fluid resuscitation, IV analgesia, and proceed with CT abdomen/pelvis to evaluate for intra-abdominal etiology.  Clinical Course as of Jun 05 351  Sat Jun 04, 2017  0348 Updated patient and family of CT imaging results. Patient tolerated PO contrast without emesis. Strict return precautions given. All verbalize understanding and agree with plan of  care.  [JS]    Clinical Course User Index [JS] Paulette Blanch, MD     ____________________________________________   FINAL CLINICAL IMPRESSION(S) / ED DIAGNOSES  Final diagnoses:  Generalized abdominal pain  Flank pain  Hypokalemia  Nausea vomiting and diarrhea  Lower urinary tract infectious disease      NEW MEDICATIONS STARTED DURING THIS VISIT:  New Prescriptions   ONDANSETRON (ZOFRAN ODT) 4 MG DISINTEGRATING TABLET    Take 1 tablet (4 mg total) by mouth every 8 (eight) hours as needed for nausea or vomiting.     Note:  This document was prepared using Dragon voice recognition software and may include unintentional dictation errors.    Paulette Blanch, MD 06/04/17 [phone]

## 2017-06-22 DIAGNOSIS — J029 Acute pharyngitis, unspecified: Secondary | ICD-10-CM | POA: Diagnosis not present

## 2017-07-08 ENCOUNTER — Telehealth: Payer: Self-pay | Admitting: Pharmacist

## 2017-07-08 NOTE — Telephone Encounter (Signed)
07/08/17 Faxed [facility] refill for Prozac 20mg  Take 2 capsules daily.Delos Haring

## 2017-08-09 ENCOUNTER — Ambulatory Visit: Payer: Self-pay

## 2017-08-15 ENCOUNTER — Telehealth: Payer: Self-pay | Admitting: Pharmacy Technician

## 2017-08-15 ENCOUNTER — Ambulatory Visit: Payer: Self-pay

## 2017-08-15 NOTE — Telephone Encounter (Signed)
[facility] shows patient has [facility] with pharmacy coverage through [employer].  [facility] will be unable to provide medication assistance since patient has pharmacy coverage.  Patient notified by letter.  [facility]

## 2017-08-16 ENCOUNTER — Other Ambulatory Visit: Payer: Self-pay

## 2017-08-16 ENCOUNTER — Ambulatory Visit: Payer: Self-pay | Admitting: Adult Health Nurse Practitioner

## 2017-08-16 VITALS — BP 123/69 | HR 88 | Temp 98.2°F | Wt 173.5 lb

## 2017-08-16 DIAGNOSIS — E119 Type 2 diabetes mellitus without complications: Secondary | ICD-10-CM

## 2017-08-16 LAB — GLUCOSE, POCT (MANUAL RESULT ENTRY): POC Glucose: 180 mg/dl — AB (ref 70–99)

## 2017-09-19 NOTE — Progress Notes (Signed)
Patient has [employer] left without seeing provider

## 2017-09-22 ENCOUNTER — Encounter: Payer: Self-pay | Admitting: Pharmacist

## 2017-10-05 ENCOUNTER — Other Ambulatory Visit: Payer: Self-pay | Admitting: Primary Care

## 2017-10-05 ENCOUNTER — Telehealth: Payer: Self-pay | Admitting: Primary Care

## 2017-10-05 ENCOUNTER — Ambulatory Visit (INDEPENDENT_AMBULATORY_CARE_PROVIDER_SITE_OTHER): Payer: 59 | Admitting: Primary Care

## 2017-10-05 ENCOUNTER — Telehealth: Payer: Self-pay

## 2017-10-05 ENCOUNTER — Encounter: Payer: Self-pay | Admitting: Primary Care

## 2017-10-05 VITALS — BP 122/78 | HR 75 | Temp 98.2°F | Ht 65.25 in | Wt 174.8 lb

## 2017-10-05 DIAGNOSIS — G4733 Obstructive sleep apnea (adult) (pediatric): Secondary | ICD-10-CM

## 2017-10-05 DIAGNOSIS — K219 Gastro-esophageal reflux disease without esophagitis: Secondary | ICD-10-CM

## 2017-10-05 DIAGNOSIS — E785 Hyperlipidemia, unspecified: Secondary | ICD-10-CM | POA: Diagnosis not present

## 2017-10-05 DIAGNOSIS — E538 Deficiency of other specified B group vitamins: Secondary | ICD-10-CM

## 2017-10-05 DIAGNOSIS — E119 Type 2 diabetes mellitus without complications: Secondary | ICD-10-CM

## 2017-10-05 DIAGNOSIS — M2540 Effusion, unspecified joint: Secondary | ICD-10-CM | POA: Diagnosis not present

## 2017-10-05 DIAGNOSIS — E559 Vitamin D deficiency, unspecified: Secondary | ICD-10-CM | POA: Diagnosis not present

## 2017-10-05 DIAGNOSIS — E876 Hypokalemia: Secondary | ICD-10-CM

## 2017-10-05 DIAGNOSIS — I10 Essential (primary) hypertension: Secondary | ICD-10-CM | POA: Diagnosis not present

## 2017-10-05 DIAGNOSIS — R000 Tachycardia, unspecified: Secondary | ICD-10-CM | POA: Diagnosis not present

## 2017-10-05 DIAGNOSIS — F332 Major depressive disorder, recurrent severe without psychotic features: Secondary | ICD-10-CM

## 2017-10-05 LAB — BASIC METABOLIC PANEL
BUN: 8 mg/dL (ref 6–23)
CO2: 31 mEq/L (ref 19–32)
Calcium: 9 mg/dL (ref 8.4–10.5)
Chloride: 100 mEq/L (ref 96–112)
Creatinine, Ser: 0.68 mg/dL (ref 0.40–1.20)
GFR: 100.69 mL/min (ref >60.00)
Glucose, Bld: 127 mg/dL — ABNORMAL HIGH (ref 70–99)
Potassium: 2.7 mEq/L — CL (ref 3.5–5.1)
Sodium: 142 mEq/L (ref 135–145)

## 2017-10-05 LAB — LDL CHOLESTEROL, DIRECT: Direct LDL: 125 mg/dL

## 2017-10-05 LAB — SEDIMENTATION RATE: Sed Rate: 18 mm/hr (ref 0–20)

## 2017-10-05 LAB — VITAMIN B12: Vitamin B-12: 850 pg/mL (ref 211–911)

## 2017-10-05 LAB — LIPID PANEL
Cholesterol: 188 mg/dL (ref 0–200)
HDL: 30.4 mg/dL — ABNORMAL LOW (ref >39.00)
NonHDL: 157.98
Total CHOL/HDL Ratio: 6
Triglycerides: 382 mg/dL — ABNORMAL HIGH (ref 0.0–149.0)
VLDL: 76.4 mg/dL — ABNORMAL HIGH (ref 0.0–40.0)

## 2017-10-05 LAB — MICROALBUMIN / CREATININE URINE RATIO
Creatinine,U: 143.8 mg/dL
Microalb Creat Ratio: 0.7 mg/g (ref 0.0–30.0)
Microalb, Ur: 0.9 mg/dL (ref 0.0–1.9)

## 2017-10-05 LAB — VITAMIN D 25 HYDROXY (VIT D DEFICIENCY, FRACTURES): VITD: 23.39 ng/mL — ABNORMAL LOW (ref 30.00–100.00)

## 2017-10-05 LAB — HEMOGLOBIN A1C: Hgb A1c MFr Bld: 7.4 % — ABNORMAL HIGH (ref 4.6–6.5)

## 2017-10-05 MED ORDER — METFORMIN HCL ER 500 MG PO TB24: 500.0000 mg | ORAL_TABLET | Freq: Every day | ORAL | 1 refills | Status: DC

## 2017-10-05 MED ORDER — OMEPRAZOLE 20 MG PO CPDR: 20.0000 mg | DELAYED_RELEASE_CAPSULE | Freq: Every day | ORAL | 0 refills | Status: DC

## 2017-10-05 MED ORDER — GLIPIZIDE 5 MG PO TABS: 5.0000 mg | ORAL_TABLET | Freq: Two times a day (BID) | ORAL | 3 refills | Status: DC

## 2017-10-05 MED ORDER — POTASSIUM CHLORIDE CRYS ER 20 MEQ PO TBCR: 20.0000 meq | EXTENDED_RELEASE_TABLET | Freq: Two times a day (BID) | ORAL | 0 refills | Status: DC

## 2017-10-05 MED ORDER — MELOXICAM 15 MG PO TABS: 15.0000 mg | ORAL_TABLET | Freq: Every day | ORAL | 0 refills | Status: DC

## 2017-10-05 MED ORDER — ATORVASTATIN CALCIUM 40 MG PO TABS: 40.0000 mg | ORAL_TABLET | Freq: Every evening | ORAL | 3 refills | Status: DC

## 2017-10-05 NOTE — Telephone Encounter (Signed)
Copied from [place] [phone]. Topic: Quick Communication - See Telephone Encounter >> Oct 05, 2017  4:32 PM Conception Chancy, NT wrote: CRM for notification. See Telephone encounter for:  10/05/17.  Patient states her medications were sent to the wrong pharmacy. She states she needs them all sent to [facility] in [place] that is now on file. Thank you

## 2017-10-05 NOTE — Telephone Encounter (Signed)
Spoken and notified patient of Kate's comments. Patient verbalized understanding.  Lab appt on 10/10/2016

## 2017-10-05 NOTE — Assessment & Plan Note (Signed)
Managed on Glipizide 5 mg BID.  A1C and urine microalbumin pending today.  Discussed the importance of a healthy diet and regular exercise in order for weight loss, and to reduce the risk of any potential medical problems. Consider adding in Metformin XR once daily as she's not yet tried. Will send in refill of glipizide once labs received.

## 2017-10-05 NOTE — Assessment & Plan Note (Signed)
Stable in the office today. Managed on metoprolol tartrate mostly for tachycardia. BP and HR stable. Continue same.

## 2017-10-05 NOTE — Addendum Note (Signed)
Addended by: Jacqualin Combes on: 10/05/2017 04:58 PM   Modules accepted: Orders

## 2017-10-05 NOTE — Assessment & Plan Note (Signed)
Lipids pending today. Discussed the importance of a healthy diet and regular exercise in order for weight loss, and to reduce the risk of any potential medical problems. Continue atorvastatin.

## 2017-10-05 NOTE — Assessment & Plan Note (Signed)
Following with psychiatry and therapy.

## 2017-10-05 NOTE — Telephone Encounter (Signed)
Re-sent Rx and remove all the other pharmacies

## 2017-10-05 NOTE — Assessment & Plan Note (Signed)
Denies recent palpitations. Continue metoprolol tartrate.

## 2017-10-05 NOTE — Telephone Encounter (Addendum)
Please notify patient that potassium is very low. Start potassium chloride 20 mEq, I sent this to her pharmacy. Take 1 tablet by mouth twice daily for 5 days. Have her scheduled for potassium recheck in one week. Also see result note for other lab results.

## 2017-10-05 NOTE — Assessment & Plan Note (Signed)
Out of pantoprazole 40 mg x one month, doing well on OTC Nexium with intermittent use of Zantac. Will send in omeprazole 20 mg as the Nexuim OTC is too costly. She will update if symptoms return.

## 2017-10-05 NOTE — Telephone Encounter (Signed)
Lab called pt had a critical potassium  2.7

## 2017-10-05 NOTE — Progress Notes (Signed)
Subjective:    Patient ID: Christine Cook, female    DOB: 1975/10/07, 42 y.o.   MRN: [id number]  HPI  Christine Cook is a 42 year old female who presents today to establish care and discuss the problems mentioned below. Will obtain old records.  1) Essential Hypertension/Tachycardia: Currently managed on metoprolol tartrate 25 mg BID. She denies chest pain and palpitations recently.   BP Readings from Last 3 Encounters:  10/05/17 122/78  08/16/17 123/69  06/04/17 121/74     2) Type 2 Diabetes: Currently managed on glipizide 5 mg BID. Previously managed on metformin 500 mg once daily but could not tolerate due to GI side effects. It was recommended she try the extended release tablets of Metformin but could not afford. Her last A1C was 7.6 in August 2018 which was a decrease from 8.0 in March 2018. Last urine microalbumin was negative in January 2018. She ran out of her glipizide 5 mg tablets one month ago, has been taking an old prescription of 10 mg (taking 1/2 twice daily).   3) GERD: Currently managed on pantoprazole 40 mg BID. She will experience symptoms of chest pain, nausea, vomiting, esophageal burning. She's been out of her pantoprazole for the past one month and is taking OTC Nexium with occasional OTC Zantac.   4) Hyperlipidemia: Currently managed on atorvastatin 20 mg. Her last lipid panel was in May 2018 with Trigs of 365, LDL of 83, TC of 186. She does not take any supplemental fish oil. She endorses a poor diet, especially over the holidays.   5) Anxiety and Depression: Currently managed on Quetiapine XR 100 mg at bedtime, Effexor 150 mg daily, and fluoxetine 60 mg. She's been out of her Quetiapine XR 100 mg for the past one month, has been taking Quetiapine 50 mg once nightly. She has an appointment with [facility] on January 31st. She sees her therapist once every 3-4 weeks.  6) Joint Swelling and Pain: Repetitive movement with chopping food daily. Located to  joints of her hand knuckles bilaterally. Also with chronic foot pain, does have orthotics and has tried exercising her feet. She stands on her feet most of her day. She's taken tylenol, ibuprofen, Goody Powder without improvement. Family history of rheumatoid arthritis in her mother.  Review of Systems  Respiratory: Negative for shortness of breath.   Cardiovascular: Negative for chest pain and palpitations.  Neurological: Negative for dizziness and headaches.  Psychiatric/Behavioral:       Difficulty sleeping and has felt emotional unstable, will be seeing psychiatry in late January 2019.       Past Medical History:  Diagnosis Date  . Anxiety   . Benzodiazepine overdose   . Depression   . Diabetes ([place])   . Fatty liver disease, nonalcoholic   . Heart murmur   . Heart palpitations   . High cholesterol   . 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 Hypertension   . Interstitial cystitis   . Irritable bowel syndrome with diarrhea   . 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 Migraine   . 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 Sleep apnea with hypersomnolence    CPAP study 11-28-09,  10-02-2009 AHI was 16.5, pressure to   . Tachycardia      Social History   Socioeconomic History  . Marital status: Single    Spouse name: Not on file  . Number of children: 1  . Years of education: College  .  Highest education level: Not on file  Social Needs  . Financial resource strain: Not on file  . Food insecurity - worry: Not on file  . Food insecurity - inability: Not on file  . Transportation needs - medical: Not on file  . Transportation needs - non-medical: Not on file  Occupational History  . 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 Smoking status: Former Smoker    Packs/day: 0.25    Years: 2.00    Pack years: 0.50    Types: Cigarettes    Last attempt to quit: 02/14/1995    Years since quitting: 22.6  . Smokeless tobacco:  Never Used  Substance and Sexual Activity  . Alcohol use: No    Comment: occationally   . Drug use: No  . Sexual activity: No  Other Topics Concern  . Not on file  Social History Narrative   Single.   One daughter.   Works at [employer].   Enjoys relaxing, spending time with family.    Past Surgical History:  Procedure Laterality Date  . ADENOIDECTOMY    . BLADDER SURGERY    . CHOLECYSTECTOMY    . KNEE SURGERY     Left knee x 2   . LEEP  10/2016   CIN 1  . TONSILLECTOMY      Family History  Problem Relation Age of Onset  . Asthma Mother   . Hyperthyroidism Mother   . Rheum arthritis Mother   . Graves' disease Mother   . Sjogren's syndrome Mother   . Asthma Daughter   . Colon cancer Neg Hx     Allergies  Allergen Reactions  . Codeine Hives and Nausea And Vomiting  . Morphine Hives  . Neurontin [Gabapentin] Other (See Comments)    syncope    Current Outpatient Medications on File Prior to Visit  Medication Sig Dispense Refill  . atorvastatin (LIPITOR) 20 MG tablet Take 1 tablet (20 mg total) by mouth daily. 90 tablet 0  . cholecalciferol 400 units tablet Take 1 tablet (400 Units total) by mouth daily. 30 each 0  . FLUoxetine (PROZAC) 20 MG capsule Take 60 mg by mouth daily.    Marland Kitchen glipiZIDE (GLUCOTROL) 5 MG tablet Take 1 tablet (5 mg total) by mouth 2 (two) times daily before a meal. 60 tablet 2  . metoprolol tartrate (LOPRESSOR) 25 MG tablet Take 1 tablet (25 mg total) by mouth 2 (two) times daily. 180 tablet 0  . pantoprazole (PROTONIX) 40 MG tablet Take 1 tablet (40 mg total) by mouth 2 (two) times daily. 180 tablet 0  . QUEtiapine (SEROQUEL) 50 MG tablet Take 3 tablets (150 mg total) by mouth at bedtime. (Patient taking differently: Take 100 mg by mouth at bedtime. ) 30 tablet 0  . venlafaxine (EFFEXOR) 75 MG tablet Take 150 mg by mouth daily.    . vitamin B-12 1000 MCG tablet Take 1 tablet (1,000 mcg total) by mouth daily. 30 tablet    No current  facility-administered medications on file prior to visit.     BP 122/78   Pulse 75   Temp 98.2 F (36.8 C) (Oral)   Ht 5' 5.25" (1.657 m)   Wt 174 lb 12.8 oz (79.3 kg)   SpO2 97%   BMI 28.87 kg/m    Objective:   Physical Exam  Constitutional: She appears well-nourished.  Neck: Neck supple.  Cardiovascular: Normal rate and regular rhythm.  Pulmonary/Chest: Effort normal and  breath sounds normal.  Musculoskeletal:  Mild inflammation to MCP joints of 2-4th digits bilaterally  Skin: Skin is warm and dry.  Psychiatric: She has a normal mood and affect.          Assessment & Plan:

## 2017-10-05 NOTE — Addendum Note (Signed)
Addended by: Pleas Koch on: 10/05/2017 04:45 PM   Modules accepted: Orders

## 2017-10-05 NOTE — Assessment & Plan Note (Signed)
Not sleeping with CPAP machine, now broken, never had good sleep with CPAP.  Last sleep study several years ago. Recommended she have her machine looked at for repair.

## 2017-10-05 NOTE — Patient Instructions (Addendum)
Stop by the lab prior to leaving today. I will notify you of your results once received.   I will send in refills once we receive your lab results.  Start taking omeprazole 20 mg tablets once daily for heartburn. Use Zantac as needed.  Get established with a gynecologist as discussed.  You may take the Meloxicam 15 mg tablets once daily as needed for inflammation and pain to the joints. Do not take any Ibuprofen, Aleve, Motrin, naproxen with this medication.  Schedule a follow up visit in 6 months for diabetes recheck.  It was a pleasure to meet you today! Please don't hesitate to call or message me with any questions. Welcome to [facility]!

## 2017-10-06 ENCOUNTER — Telehealth: Payer: Self-pay | Admitting: Primary Care

## 2017-10-06 DIAGNOSIS — M2540 Effusion, unspecified joint: Secondary | ICD-10-CM

## 2017-10-06 LAB — RHEUMATOID FACTOR: Rhuematoid fact SerPl-aCnc: <14 IU/mL (ref <14)

## 2017-10-06 MED ORDER — MELOXICAM 15 MG PO TABS: 15.0000 mg | ORAL_TABLET | Freq: Every day | ORAL | 0 refills | Status: DC

## 2017-10-06 NOTE — Telephone Encounter (Signed)
Copied from [place] [id number]. Topic: Quick Communication - See Telephone Encounter >> Oct 06, 2017  3:28 PM Bea Graff, NT wrote: CRM for notification. See Telephone encounter for: Patient calling back today and needed 2 of her medicines sent to [facility] on [address]. Meloxicam (MOBIC) and omeprazole (PRILOSEC). Pt states [facility] is not in network.   10/06/17.

## 2017-10-06 NOTE — Telephone Encounter (Addendum)
I spoke with pt and meloxicam and omeprazole [facility] and pt spoke with a nurse on 10/05/17 that was going to have meds transferred to [facility] but pt said [facility] said transferred meds but [facility] did not receive. I called [facility] and spoke with [place] said [facility] transferred omeprazole but not meloxicam. Meloxicam phoned to [place] at [facility] as instructed. Pt will ck with [facility] later today to pick up meds.

## 2017-10-07 ENCOUNTER — Encounter: Payer: Self-pay | Admitting: Primary Care

## 2017-10-07 ENCOUNTER — Ambulatory Visit: Payer: Self-pay | Admitting: *Deleted

## 2017-10-07 ENCOUNTER — Other Ambulatory Visit: Payer: Self-pay | Admitting: Family Medicine

## 2017-10-07 ENCOUNTER — Telehealth: Payer: Self-pay | Admitting: Primary Care

## 2017-10-07 ENCOUNTER — Other Ambulatory Visit: Payer: Self-pay | Admitting: Primary Care

## 2017-10-07 DIAGNOSIS — I10 Essential (primary) hypertension: Secondary | ICD-10-CM

## 2017-10-07 DIAGNOSIS — E876 Hypokalemia: Secondary | ICD-10-CM

## 2017-10-07 DIAGNOSIS — R000 Tachycardia, unspecified: Secondary | ICD-10-CM

## 2017-10-07 MED ORDER — METOPROLOL TARTRATE 25 MG PO TABS: 25.0000 mg | ORAL_TABLET | Freq: Two times a day (BID) | ORAL | 1 refills | Status: DC

## 2017-10-07 NOTE — Telephone Encounter (Addendum)
I spoke with pt; pt said it is difficult to describe how she feels.; now HR is 92; still has some shakiness and SOB but not as much as earlier. No CP; pt is going to have metoprolol picked up at pharmacy. Pt said she is not having trouble breathing now. If pt condition worsens over weekend pt will either call on call provider or will go to [facility] or ED. FYI to Allie Bossier NP.

## 2017-10-07 NOTE — Telephone Encounter (Addendum)
Pt reporting HR 107-120 "all day." Feels "shakey" B/P this AM 116/77, during call 134/84.Marland Kitchen HR 107. Pt reports BS at 1430 235. Denies chest pain, tightness, dizziness,nausea, no other symptoms. Pt states she has not taken her Metoprolol since Monday; on Monday she took 1/2 dose, none since.  Also has substituted "regular" Seroquel for her prescribed XL and her Effexor for "leftover XR Effexor" Also has taken only 5mg  of her Glypizide instead of 10mg . Pt aware the Rx for Metoprolol has been called in. States she will pick up med and start taking as prescribed. Pt hung up before additional assessment or care advice given. Called back, left message.  1610: Pt called back. Instructed to pick up metoprolol from Rx and take medication as prescribed. Instructed to call back if symptoms worsen. Also discussed importance of taking meds as prescribed. Note routed to practice. Reason for Disposition . Palpitations are a chronic symptom (recurrent or ongoing AND present > 4 weeks)  Answer Assessment - Initial Assessment Questions 1. DESCRIPTION: "Please describe your heart rate or heart beat that you are having" (e.g., fast/slow, regular/irregular, skipped or extra beats, "palpitations")     HR 107-120 at rest 2. ONSET: "When did it start?" (Minutes, hours or days)      This am 3. DURATION: "How long does it last" (e.g., seconds, minutes, hours)     Constant 4. PATTERN "Does it come and go, or has it been constant since it started?"  "Does it get worse with exertion?"   "Are you feeling it now?"     Faster with exertion "somewhat" 5. TAP: "Using your hand, can you tap out what you are feeling on a chair or table in front of you, so that I can hear?" (Note: not all patients can do this)       no 6. HEART RATE: "Can you tell me your heart rate?" "How many beats in 15 seconds?"  (Note: not all patients can do this)       107 7. RECURRENT SYMPTOM: "Have you ever had this before?" If so, ask: "When was the last  time?" and "What happened that time?"      Before I took Lopressor. 1-1.5 years ago 8. CAUSE: "What do you think is causing the palpitations?"     No palpitations, fast HR. 9. CARDIAC HISTORY: "Do you have any history of heart disease?" (e.g., heart attack, angina, bypass surgery, angioplasty, arrhythmia)      Heart murmur 10. OTHER SYMPTOMS: "Do you have any other symptoms?" (e.g., dizziness, chest pain, sweating, difficulty breathing)       no 11. PREGNANCY: "Is there any chance you are pregnant?" "When was your last menstrual period?"       no  Protocols used: HEART RATE AND HEARTBEAT QUESTIONS-A-AH

## 2017-10-07 NOTE — Telephone Encounter (Signed)
Copied from [place] [id number]. Topic: Inquiry >> Oct 07, 2017 11:22 AM Patrice Paradise wrote: Reason for CRM: Patient called to see why she got a msg thru [facility] stating that her request for a refill on her metoprolol tartrate (LOPRESSOR) 25 MG tablet was denied. Patient is requesting a call back asap @ [phone].

## 2017-10-07 NOTE — Telephone Encounter (Addendum)
Glipizide, Lipitor, omeprazole, metformin, potassium were all sent to her pharmacy on 10/05/16. Metoprolol was sent today as she failed to inform us during her last visit that she was running low. She follows with psychiatry who fills her other medications. She should be able to resume all of her medications per normal. Agree that if symptoms become worse to go to nearest UC or ED.

## 2017-10-07 NOTE — Telephone Encounter (Signed)
Pt requesting a refill on Lopressor.

## 2017-10-10 ENCOUNTER — Other Ambulatory Visit: Payer: 59

## 2017-10-10 ENCOUNTER — Telehealth: Payer: Self-pay | Admitting: Primary Care

## 2017-10-10 NOTE — Telephone Encounter (Signed)
Per DPR, left detail message of Kate's comments for patient to call back. 

## 2017-10-10 NOTE — Telephone Encounter (Signed)
Copied from [place]. Topic: Quick Communication - Office Called Patient >> Oct 10, 2017 10:09 AM Robina Ade, Helene Kelp D wrote: Reason for CRM: Patient called returning [place] missed phone call, please call patient back, thanks.

## 2017-10-10 NOTE — Telephone Encounter (Signed)
Copied from [place]. Topic: Quick Communication - Office Called Patient >> Oct 10, 2017 10:09 AM Robina Ade, Helene Kelp D wrote: Reason for CRM: Patient called returning [place] missed phone call, please call patient back, thanks.

## 2017-10-10 NOTE — Telephone Encounter (Signed)
I have spoken to patient. She stated that she is better than she was on Friday. She is still very tired.

## 2017-10-10 NOTE — Telephone Encounter (Signed)
This was addressed in another telephone encounter on 10/11/2017

## 2017-10-10 NOTE — Telephone Encounter (Signed)
Noted  

## 2017-10-11 ENCOUNTER — Other Ambulatory Visit (INDEPENDENT_AMBULATORY_CARE_PROVIDER_SITE_OTHER): Payer: 59

## 2017-10-11 DIAGNOSIS — E876 Hypokalemia: Secondary | ICD-10-CM

## 2017-10-12 ENCOUNTER — Encounter: Payer: Self-pay | Admitting: Primary Care

## 2017-10-12 LAB — POTASSIUM: Potassium: 3.7 mEq/L (ref 3.5–5.1)

## 2017-10-17 ENCOUNTER — Other Ambulatory Visit: Payer: Self-pay | Admitting: Primary Care

## 2017-10-17 DIAGNOSIS — E876 Hypokalemia: Secondary | ICD-10-CM

## 2017-10-25 ENCOUNTER — Encounter: Payer: Self-pay | Admitting: Primary Care

## 2017-10-25 ENCOUNTER — Other Ambulatory Visit: Payer: Self-pay | Admitting: Primary Care

## 2017-10-25 ENCOUNTER — Other Ambulatory Visit (INDEPENDENT_AMBULATORY_CARE_PROVIDER_SITE_OTHER): Payer: 59

## 2017-10-25 DIAGNOSIS — E876 Hypokalemia: Secondary | ICD-10-CM

## 2017-10-25 DIAGNOSIS — K219 Gastro-esophageal reflux disease without esophagitis: Secondary | ICD-10-CM

## 2017-10-25 LAB — POTASSIUM: Potassium: 3.3 mEq/L — ABNORMAL LOW (ref 3.5–5.1)

## 2017-10-26 MED ORDER — POTASSIUM CHLORIDE CRYS ER 10 MEQ PO TBCR: 10.0000 meq | EXTENDED_RELEASE_TABLET | Freq: Every day | ORAL | 0 refills | Status: DC

## 2017-10-27 ENCOUNTER — Other Ambulatory Visit: Payer: 59

## 2017-10-27 ENCOUNTER — Other Ambulatory Visit (INDEPENDENT_AMBULATORY_CARE_PROVIDER_SITE_OTHER): Payer: 59

## 2017-10-27 ENCOUNTER — Other Ambulatory Visit: Payer: Self-pay | Admitting: Primary Care

## 2017-10-27 DIAGNOSIS — E876 Hypokalemia: Secondary | ICD-10-CM

## 2017-10-27 MED ORDER — PANTOPRAZOLE SODIUM 20 MG PO TBEC: 20.0000 mg | DELAYED_RELEASE_TABLET | Freq: Every day | ORAL | 0 refills | Status: DC

## 2017-10-28 ENCOUNTER — Other Ambulatory Visit: Payer: Self-pay

## 2017-10-28 ENCOUNTER — Ambulatory Visit
Admission: EM | Admit: 2017-10-28 | Discharge: 2017-10-28 | Disposition: A | Discharge status: Home / Self Care | Payer: Worker's Compensation | Attending: Family Medicine | Admitting: Family Medicine

## 2017-10-28 ENCOUNTER — Ambulatory Visit: Payer: Self-pay | Admitting: *Deleted

## 2017-10-28 DIAGNOSIS — G4489 Other headache syndrome: Secondary | ICD-10-CM

## 2017-10-28 DIAGNOSIS — S0003XA Contusion of scalp, initial encounter: Secondary | ICD-10-CM | POA: Diagnosis not present

## 2017-10-28 DIAGNOSIS — W208XXA Other cause of strike by thrown, projected or falling object, initial encounter: Secondary | ICD-10-CM

## 2017-10-28 MED ORDER — CYCLOBENZAPRINE HCL 10 MG PO TABS: 10.0000 mg | ORAL_TABLET | Freq: Every day | ORAL | 0 refills | Status: DC

## 2017-10-28 MED ORDER — KETOROLAC TROMETHAMINE 60 MG/2ML IM SOLN
60.0000 mg | Freq: Once | INTRAMUSCULAR | Status: AC
  Administered 2017-10-28: 60 mg via INTRAMUSCULAR

## 2017-10-28 NOTE — Telephone Encounter (Signed)
Pt called stating that she was hit in the back of the head with a ceiling tile at work today about 0930; she now has a moderate headache and nausea; per nurse protocol pt instructed to go to ED; she verbalizes understanding and will proceed there; will route to [place] for notification of this encounter.     Reason for Disposition . Vomiting once or more  Answer Assessment - Initial Assessment Questions 1. MECHANISM: "How did the injury happen?" For falls, ask: "What height did you fall from?" and "What surface did you fall against?"      Ceiling tile fell and hit her in the back of the head 2. ONSET: "When did the injury happen?" (Minutes or hours ago)      10/28/17 approximately 0930 3. NEUROLOGIC SYMPTOMS: "Was there any loss of consciousness?" "Are there any other neurological symptoms?"      no 4. MENTAL STATUS: "Does the person know who he is, who you are, and where he is?"      no 5. LOCATION: "What part of the head was hit?"      Back of head (left middle) 6. SCALP APPEARANCE: "What does the scalp look like? Is it bleeding now?" If so, ask: "Is it difficult to stop?"      no 7. SIZE: For cuts, bruises, or swelling, ask: "How large is it?" (e.g., inches or centimeters)      n/a 8. PAIN: "Is there any pain?" If so, ask: "How bad is it?"  (e.g., Scale 1-10; or mild, moderate, severe)     moderate 9. TETANUS: For any breaks in the skin, ask: "When was the last tetanus booster?"     no 10. OTHER SYMPTOMS: "Do you have any other symptoms?" (e.g., neck pain, vomiting)       nausea 11. PREGNANCY: "Is there any chance you are pregnant?" "When was your last menstrual period?"       No spotting this week and IUD  Protocols used: HEAD INJURY-A-AH

## 2017-10-28 NOTE — ED Triage Notes (Signed)
Patient c/o headache, she states she was at work and a ceiling tile fell on her head this morning.

## 2017-10-28 NOTE — Telephone Encounter (Signed)
Noted, will keep an eye out for any ED notes/visits.

## 2017-10-28 NOTE — ED Provider Notes (Signed)
[facility]    CSN: [id number] Arrival date & time: 10/28/17  1748     History   Chief Complaint Chief Complaint  Patient presents with  . Headache    HPI Christine Cook is a 42 y.o. female.   42 yo female with a c/o headache. States that this morning while at work a plastic ceiling tile fell and hit the back of her head. Denies any loss of consciousness, however later began developing a headache. Patient went home and took some tylenol, however reports only minimal improvement of the headache. Denies any vision changes, vomiting, numbness/tingling, speech or swallowing problems, one-sided weakness, confusion.    The history is provided by the patient.    Past Medical History:  Diagnosis Date  . Anxiety   . Benzodiazepine overdose   . Depression   . Diabetes ([place])   . Fatty liver disease, nonalcoholic   . Heart murmur   . Heart palpitations   . High cholesterol   . 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 Hypertension   . Interstitial cystitis   . Irritable bowel syndrome with diarrhea   . 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 Migraine   . 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 Sleep apnea with hypersomnolence    CPAP study 11-28-09,  10-02-2009 AHI was 16.5, pressure to   . Tachycardia     Patient Active Problem List   Diagnosis Date Noted  . Hyperlipidemia 08/05/2016  . Tachycardia 08/05/2016  . OSA (obstructive sleep apnea) 07/12/2016  . 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 Vitamin B12 deficiency 07/12/2016  . Vitamin D deficiency 07/12/2016  . Diabetes mellitus without complication ([place]) 05/39/7673  . 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 Impulse control disorder ([place]) 07/11/2016  . Hypertension 07/09/2016  . GERD 01/21/2009    Past Surgical History:  Procedure Laterality Date  . ADENOIDECTOMY    . BLADDER SURGERY    . CHOLECYSTECTOMY    . KNEE SURGERY     Left knee x 2   . LEEP  10/2016   CIN 1  .  TONSILLECTOMY      OB History    No data available       Home Medications    Prior to Admission medications   Medication Sig Start Date End Date Taking? Authorizing Provider  cholecalciferol 400 units tablet Take 1 tablet (400 Units total) by mouth daily. 07/20/16  Yes Hildred Priest, MD  FLUoxetine (PROZAC) 20 MG capsule Take 60 mg by mouth daily.   Yes Ahluwalia, Shamsher S, MD  glipiZIDE (GLUCOTROL) 5 MG tablet Take 1 tablet (5 mg total) by mouth 2 (two) times daily before a meal. 10/05/17  Yes Pleas Koch, NP  metFORMIN (GLUCOPHAGE XR) 500 MG 24 hr tablet Take 1 tablet (500 mg total) by mouth daily with breakfast. 10/05/17 10/05/18 Yes Pleas Koch, NP  metoprolol tartrate (LOPRESSOR) 25 MG tablet Take 1 tablet (25 mg total) by mouth 2 (two) times daily. 10/07/17  Yes Pleas Koch, NP  pantoprazole (PROTONIX) 20 MG tablet Take 1 tablet (20 mg total) by mouth daily. 10/27/17  Yes Pleas Koch, NP  potassium chloride (K-DUR,KLOR-CON) 10 MEQ tablet Take 1 tablet (10 mEq total) by mouth daily. 10/26/17  Yes Pleas Koch, NP  QUEtiapine (SEROQUEL) 50 MG tablet Take 3 tablets (150 mg total) by mouth at bedtime. Patient taking differently: Take 100 mg by mouth at bedtime.  07/19/16  Yes  Hildred Priest, MD  venlafaxine (EFFEXOR) 75 MG tablet Take 150 mg by mouth daily.   Yes [provider]  vitamin B-12 1000 MCG tablet Take 1 tablet (1,000 mcg total) by mouth daily. 07/19/16  Yes Hildred Priest, MD  atorvastatin (LIPITOR) 40 MG tablet Take 1 tablet (40 mg total) by mouth every evening. 10/05/17 10/05/18  Pleas Koch, NP  cyclobenzaprine (FLEXERIL) 10 MG tablet Take 1 tablet (10 mg total) by mouth at bedtime. prn 10/28/17   Norval Gable, MD  meloxicam (MOBIC) 15 MG tablet Take 1 tablet (15 mg total) by mouth daily. 10/06/17   Pleas Koch, NP    Family History Family History  Problem Relation Age of Onset  . Asthma  Mother   . Hyperthyroidism Mother   . Rheum arthritis Mother   . Graves' disease Mother   . Sjogren's syndrome Mother   . Asthma Daughter   . Colon cancer Neg Hx     Social History Social History   Tobacco Use  . Smoking status: Former Smoker    Packs/day: 0.25    Years: 2.00    Pack years: 0.50    Types: Cigarettes    Last attempt to quit: 02/14/1995    Years since quitting: 22.7  . Smokeless tobacco: Never Used  Substance Use Topics  . Alcohol use: No    Comment: occationally   . Drug use: No     Allergies   Codeine; Morphine; and Neurontin [gabapentin]   Review of Systems Review of Systems   Physical Exam Triage Vital Signs ED Triage Vitals  Enc Vitals Group     BP 10/28/17 1758 139/81     Pulse Rate 10/28/17 1758 73     Resp --      Temp 10/28/17 1758 98.5 F (36.9 C)     Temp Source 10/28/17 1758 Oral     SpO2 10/28/17 1758 100 %     Weight 10/28/17 1759 175 lb (79.4 kg)     Height 10/28/17 1759 5\' 5"  (1.651 m)     Head Circumference --      Peak Flow --      Pain Score 10/28/17 1759 7     Pain Loc --      Pain Edu? --      Excl. in [place]? --    No data found.  Updated Vital Signs BP 139/81 (BP Location: Left Arm)   Pulse 73   Temp 98.5 F (36.9 C) (Oral)   Ht 5\' 5"  (1.651 m)   Wt 175 lb (79.4 kg)   SpO2 100%   BMI 29.12 kg/m   Visual Acuity Right Eye Distance:   Left Eye Distance:   Bilateral Distance:    Right Eye Near:   Left Eye Near:    Bilateral Near:     Physical Exam  Constitutional: She is oriented to person, place, and time. She appears well-developed and well-nourished. No distress.  HENT:  Head: Normocephalic.  Right Ear: Tympanic membrane, external ear and ear canal normal.  Left Ear: Tympanic membrane, external ear and ear canal normal.  Nose: Nose normal.  Mouth/Throat: Oropharynx is clear and moist and mucous membranes are normal.  Eyes: Conjunctivae and EOM are normal. Pupils are equal, round, and reactive to  light. Right eye exhibits no discharge. Left eye exhibits no discharge. No scleral icterus.  Neck: Normal range of motion. Neck supple. No JVD present. No tracheal deviation present. No thyromegaly present.  Cardiovascular: Normal  rate.  Pulmonary/Chest: Effort normal. No respiratory distress.  Musculoskeletal: She exhibits no edema or tenderness.  Lymphadenopathy:    She has no cervical adenopathy.  Neurological: She is alert and oriented to person, place, and time. She has normal reflexes. She displays normal reflexes. No cranial nerve deficit or sensory deficit. She exhibits normal muscle tone. Coordination normal.  Skin: Skin is warm and dry. No rash noted. She is not diaphoretic. No erythema. No pallor.  Psychiatric: She has a normal mood and affect. Her behavior is normal. Judgment and thought content normal.  Vitals reviewed.    UC Treatments / Results  Labs (all labs ordered are listed, but only abnormal results are displayed) Labs Reviewed - No data to display  EKG  EKG Interpretation None       Radiology No results found.  Procedures Procedures (including critical care time)  Medications Ordered in UC Medications  ketorolac (TORADOL) injection 60 mg (60 mg Intramuscular Given 10/28/17 1829)     Initial Impression / Assessment and Plan / UC Course  I have reviewed the triage vital signs and the nursing notes.  Pertinent labs & imaging results that were available during my care of the patient were reviewed by me and considered in my medical decision making (see chart for details).      Final Clinical Impressions(s) / UC Diagnoses   Final diagnoses:  Other headache syndrome  Contusion of scalp, initial encounter  (normal exam currently)  ED Discharge Orders        Ordered    cyclobenzaprine (FLEXERIL) 10 MG tablet  Daily at bedtime     10/28/17 1904     1.diagnosis reviewed with patient; patient given toradol 60mg  IM x 1 with improvement of  symptoms 2. rx as per orders above; reviewed possible side effects, interactions, risks and benefits  3. Recommend supportive treatment with otc analgesics prn 4. Recommend monitoring for any worsening symptoms (red flag symptoms) and go to Emergency Department if she develops these 5. Follow-up prn if symptoms worsen or don't improve  Controlled Substance Prescriptions [place] Controlled Substance Registry consulted? Not Applicable   Norval Gable, MD 10/28/17 [id number]

## 2017-10-29 ENCOUNTER — Ambulatory Visit
Admission: EM | Admit: 2017-10-29 | Discharge: 2017-10-29 | Disposition: A | Discharge status: Home / Self Care | Payer: Worker's Compensation | Attending: Family Medicine | Admitting: Family Medicine

## 2017-10-29 ENCOUNTER — Other Ambulatory Visit: Payer: Self-pay

## 2017-10-29 DIAGNOSIS — S0990XD Unspecified injury of head, subsequent encounter: Secondary | ICD-10-CM | POA: Diagnosis not present

## 2017-10-29 DIAGNOSIS — R110 Nausea: Secondary | ICD-10-CM

## 2017-10-29 DIAGNOSIS — R51 Headache: Secondary | ICD-10-CM | POA: Diagnosis not present

## 2017-10-29 DIAGNOSIS — W208XXA Other cause of strike by thrown, projected or falling object, initial encounter: Secondary | ICD-10-CM | POA: Diagnosis not present

## 2017-10-29 DIAGNOSIS — R519 Headache, unspecified: Secondary | ICD-10-CM

## 2017-10-29 MED ORDER — BUTALBITAL-APAP-CAFFEINE 50-325-40 MG PO TABS: 1.0000 | ORAL_TABLET | Freq: Four times a day (QID) | ORAL | 0 refills | Status: DC | PRN

## 2017-10-29 NOTE — Discharge Instructions (Signed)
Rest  Medication as prescribed.  Take care  Dr. Amera Banos  

## 2017-10-29 NOTE — ED Provider Notes (Signed)
[facility]    CSN: [id number] Arrival date & time: 10/29/17  1232  History   Chief Complaint Chief Complaint  Patient presents with  . Headache   HPI  42 year old female presents with headache.  Patient was at work yesterday.  She was hit in the head with a falling ceiling tile.  She states that she was hit in the back of the head.  She denies loss of consciousness.  No vomiting.  No vision changes.  She was seen here for evaluation and was treated with Toradol.  She had improvement of her headache.  She was prescribed Flexeril and has not picked up the prescription.  Patient states that she was at work today and did not feel well.  She continues to have headache which is 5/10 in severity.  Located in the frontal region.  Patient also feels that the left side of her face is swollen.  She has no current vision changes.  She does report nausea.  No photophobia.  She has taken Tylenol without resolution in her headache.  No reported weakness or other neurological issues.  No other complaints or concerns at this time.  Past Medical History:  Diagnosis Date  . Anxiety   . Benzodiazepine overdose   . Depression   . Diabetes ([place])   . Fatty liver disease, nonalcoholic   . Heart murmur   . Heart palpitations   . High cholesterol   . 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 Hypertension   . Interstitial cystitis   . Irritable bowel syndrome with diarrhea   . 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 Migraine   . 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 Sleep apnea with hypersomnolence    CPAP study 11-28-09,  10-02-2009 AHI was 16.5, pressure to   . Tachycardia     Patient Active Problem List   Diagnosis Date Noted  . Hyperlipidemia 08/05/2016  . Tachycardia 08/05/2016  . OSA (obstructive sleep apnea) 07/12/2016  . 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 Vitamin B12 deficiency 07/12/2016  . Vitamin D deficiency 07/12/2016  . Diabetes mellitus without complication  ([place]) 32/35/5732  . 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 Impulse control disorder (gambling) 07/11/2016  . Hypertension 07/09/2016  . GERD 01/21/2009    Past Surgical History:  Procedure Laterality Date  . ADENOIDECTOMY    . BLADDER SURGERY    . CHOLECYSTECTOMY    . KNEE SURGERY     Left knee x 2   . LEEP  10/2016   CIN 1  . TONSILLECTOMY      OB History    No data available       Home Medications    Prior to Admission medications   Medication Sig Start Date End Date Taking? Authorizing Provider  levonorgestrel (MIRENA) 20 MCG/24HR IUD 1 each by Intrauterine route once.   Yes [provider]  atorvastatin (LIPITOR) 40 MG tablet Take 1 tablet (40 mg total) by mouth every evening. 10/05/17 10/05/18  Pleas Koch, NP  butalbital-acetaminophen-caffeine (FIORICET, ESGIC) [phone] MG tablet Take 1 tablet by mouth every 6 (six) hours as needed for headache. 10/29/17   Coral Spikes, DO  cholecalciferol 400 units tablet Take 1 tablet (400 Units total) by mouth daily. 07/20/16   Hildred Priest, MD  cyclobenzaprine (FLEXERIL) 10 MG tablet Take 1 tablet (10 mg total) by mouth at bedtime. prn 10/28/17   Norval Gable, MD  FLUoxetine (PROZAC) 20 MG capsule Take 60  mg by mouth daily.    Ahluwalia, Shamsher S, MD  glipiZIDE (GLUCOTROL) 5 MG tablet Take 1 tablet (5 mg total) by mouth 2 (two) times daily before a meal. 10/05/17   Pleas Koch, NP  metFORMIN (GLUCOPHAGE XR) 500 MG 24 hr tablet Take 1 tablet (500 mg total) by mouth daily with breakfast. 10/05/17 10/05/18  Pleas Koch, NP  metoprolol tartrate (LOPRESSOR) 25 MG tablet Take 1 tablet (25 mg total) by mouth 2 (two) times daily. 10/07/17   Pleas Koch, NP  pantoprazole (PROTONIX) 20 MG tablet Take 1 tablet (20 mg total) by mouth daily. 10/27/17   Pleas Koch, NP  potassium chloride (K-DUR,KLOR-CON) 10 MEQ tablet Take 1 tablet (10 mEq total) by mouth daily. 10/26/17   Pleas Koch,  NP  QUEtiapine (SEROQUEL) 50 MG tablet Take 3 tablets (150 mg total) by mouth at bedtime. Patient taking differently: Take 100 mg by mouth at bedtime.  07/19/16   Hildred Priest, MD  venlafaxine (EFFEXOR) 75 MG tablet Take 150 mg by mouth daily.    [provider]  vitamin B-12 1000 MCG tablet Take 1 tablet (1,000 mcg total) by mouth daily. 07/19/16   Hildred Priest, MD    Family History Family History  Problem Relation Age of Onset  . Asthma Mother   . Hyperthyroidism Mother   . Rheum arthritis Mother   . Graves' disease Mother   . Sjogren's syndrome Mother   . Asthma Daughter   . Colon cancer Neg Hx     Social History Social History   Tobacco Use  . Smoking status: Former Smoker    Packs/day: 0.25    Years: 2.00    Pack years: 0.50    Types: Cigarettes    Last attempt to quit: 02/14/1995    Years since quitting: 22.7  . Smokeless tobacco: Never Used  Substance Use Topics  . Alcohol use: No    Frequency: Never  . Drug use: No     Allergies   Codeine; Morphine; and Neurontin [gabapentin]   Review of Systems Review of Systems  Constitutional: Negative.   Gastrointestinal: Positive for nausea.  Neurological: Positive for headaches. Negative for syncope.   Physical Exam Triage Vital Signs ED Triage Vitals  Enc Vitals Group     BP 10/29/17 1244 122/81     Pulse Rate 10/29/17 1244 82     Resp 10/29/17 1244 16     Temp 10/29/17 1244 98.2 F (36.8 C)     Temp Source 10/29/17 1244 Oral     SpO2 10/29/17 1244 100 %     Weight 10/29/17 1246 175 lb (79.4 kg)     Height 10/29/17 1246 5\' 5"  (1.651 m)     Head Circumference --      Peak Flow --      Pain Score 10/29/17 1246 5     Pain Loc --      Pain Edu? --      Excl. in [place]? --    Updated Vital Signs BP 122/81 (BP Location: Right Arm)   Pulse 82   Temp 98.2 F (36.8 C) (Oral)   Resp 16   Ht 5\' 5"  (1.651 m)   Wt 175 lb (79.4 kg)   SpO2 100%   BMI 29.12 kg/m       Physical Exam  Constitutional: She is oriented to person, place, and time. She appears well-developed and well-nourished. No distress.  HENT:  Head: Normocephalic and atraumatic.  Nose: Nose normal.  Mouth/Throat: Oropharynx is clear and moist.  Eyes: Conjunctivae and EOM are normal. Pupils are equal, round, and reactive to light.  Cardiovascular: Normal rate and regular rhythm.  No murmur heard. Pulmonary/Chest: Effort normal and breath sounds normal. She has no wheezes. She has no rales.  Neurological: She is alert and oriented to person, place, and time.  Cranial nerves intact.  Normal muscle strength.  Cerebellar testing normal.  Psychiatric:  Flat affect.  Depressed mood.  Nursing note and vitals reviewed.  UC Treatments / Results  Labs (all labs ordered are listed, but only abnormal results are displayed) Labs Reviewed - No data to display  EKG  EKG Interpretation None       Radiology No results found.  Procedures Procedures (including critical care time)  Medications Ordered in UC Medications - No data to display   Initial Impression / Assessment and Plan / UC Course  I have reviewed the triage vital signs and the nursing notes.  Pertinent labs & imaging results that were available during my care of the patient were reviewed by me and considered in my medical decision making (see chart for details).     42 year old female presents with ongoing headache after suffering a head injury.  Patient is well-appearing.  There is no appreciable facial swelling on exam.  She is neurologically intact.  No indication for advanced imaging.  Treating headache with Fioricet.  Supportive care.  Out of work until Tuesday.  Final Clinical Impressions(s) / UC Diagnoses   Final diagnoses:  Injury of head, subsequent encounter  Acute nonintractable headache, unspecified headache type    ED Discharge Orders        Ordered    butalbital-acetaminophen-caffeine (FIORICET,  ESGIC) 50-325-40 MG tablet  Every 6 hours PRN     10/29/17 1325     Controlled Substance Prescriptions [place] Controlled Substance Registry consulted? Not Applicable   Coral Spikes, DO 10/29/17 1347

## 2017-10-29 NOTE — ED Triage Notes (Addendum)
Pt works at [employer] and reports a ceiling tile fell out of the ceiling and hit her in the head. Had Toradol shot and was feeling somewhat better but pain never completely subsided. Today pain is 5/10 and feels like her left face is swollen. Pt was prescribed Flexeril but has not picked it up yet

## 2017-10-31 ENCOUNTER — Telehealth: Payer: Self-pay | Admitting: *Deleted

## 2017-10-31 DIAGNOSIS — J069 Acute upper respiratory infection, unspecified: Secondary | ICD-10-CM | POA: Diagnosis not present

## 2017-10-31 NOTE — Telephone Encounter (Signed)
Called patient, verified DOB, patient reported feeling about the same and is planning to follow up with her PCP. Advised patient to go to the ED if symptoms becomes suddenly worse.

## 2017-11-01 ENCOUNTER — Encounter: Payer: Self-pay | Admitting: Emergency Medicine

## 2017-11-01 ENCOUNTER — Emergency Department
Admission: EM | Admit: 2017-11-01 | Discharge: 2017-11-01 | Disposition: A | Discharge status: Home / Self Care | Payer: Worker's Compensation | Attending: Emergency Medicine | Admitting: Emergency Medicine

## 2017-11-01 ENCOUNTER — Ambulatory Visit: Payer: Self-pay | Admitting: *Deleted

## 2017-11-01 ENCOUNTER — Other Ambulatory Visit: Payer: Self-pay

## 2017-11-01 DIAGNOSIS — Z7984 Long term (current) use of oral hypoglycemic drugs: Secondary | ICD-10-CM | POA: Diagnosis not present

## 2017-11-01 DIAGNOSIS — Z79899 Other long term (current) drug therapy: Secondary | ICD-10-CM | POA: Insufficient documentation

## 2017-11-01 DIAGNOSIS — E119 Type 2 diabetes mellitus without complications: Secondary | ICD-10-CM | POA: Diagnosis not present

## 2017-11-01 DIAGNOSIS — R51 Headache: Secondary | ICD-10-CM | POA: Diagnosis present

## 2017-11-01 DIAGNOSIS — G43809 Other migraine, not intractable, without status migrainosus: Secondary | ICD-10-CM | POA: Diagnosis not present

## 2017-11-01 DIAGNOSIS — Z87891 Personal history of nicotine dependence: Secondary | ICD-10-CM | POA: Diagnosis not present

## 2017-11-01 DIAGNOSIS — I10 Essential (primary) hypertension: Secondary | ICD-10-CM | POA: Insufficient documentation

## 2017-11-01 LAB — ALDOSTERONE + RENIN ACTIVITY W/ RATIO
Aldosterone: <1 ng/dL
Renin Activity: 0.57 ng/mL/h (ref 0.25–5.82)

## 2017-11-01 MED ORDER — DEXTROSE 5 % IV SOLN
20.0000 mg | Freq: Once | INTRAVENOUS | Status: AC
  Administered 2017-11-01: 20 mg via INTRAVENOUS
  Filled 2017-11-01: qty 4

## 2017-11-01 MED ORDER — DIPHENHYDRAMINE HCL 50 MG/ML IJ SOLN
25.0000 mg | Freq: Once | INTRAMUSCULAR | Status: AC
  Administered 2017-11-01: 25 mg via INTRAVENOUS
  Filled 2017-11-01: qty 1

## 2017-11-01 MED ORDER — KETOROLAC TROMETHAMINE 30 MG/ML IJ SOLN
30.0000 mg | Freq: Once | INTRAMUSCULAR | Status: AC
  Administered 2017-11-01: 30 mg via INTRAVENOUS
  Filled 2017-11-01: qty 1

## 2017-11-01 NOTE — ED Triage Notes (Signed)
States Friday morning at work a ceiling tile fell and hit patient in head.  - LOC.  Presents today with continued headache.  Has been seen by Urgent Care twice for same.  Symptoms not resolved.

## 2017-11-01 NOTE — ED Notes (Signed)
Pt states she was hit in the back of the head with a ceiling tile while at work on Friday and has had a HA since, states she was seen at urgent care on Friday and Saturday and given Fioricet and muscle relaxer with no relief..denies any visual changes. Denies LOC, states she thinks it was made from plastic.. C/o left sided posterior head pain that radiates into the shoulder.

## 2017-11-01 NOTE — Telephone Encounter (Signed)
  Pt states a ceiling tile fell on the back of her head on Friday. She now has a headache, nauseated, some facial swelling and dental pain. She denies dizziness.  Has taken Ibuprofen and Tylenol which did not help much. She was also put on Flexeril and Fioricet. Home care advice given to patient.  Will check with flow for an appointment today.   Reason for Disposition . [1] After 72 hours AND [2] headache persists  Answer Assessment - Initial Assessment Questions 1. MECHANISM: "How did the injury happen?" For falls, ask: "What height did you fall from?" and "What surface did you fall against?"      Friday morning, ceiling tile fell on her head 2. ONSET: "When did the injury happen?" (Minutes or hours ago)      Friday morning 3. NEUROLOGIC SYMPTOMS: "Was there any loss of consciousness?" "Are there any other neurological symptoms?"      No 4. MENTAL STATUS: "Does the person know who he is, who you are, and where he is?"      yes 5. LOCATION: "What part of the head was hit?"      Back of head 6. SCALP APPEARANCE: "What does the scalp look like? Is it bleeding now?" If so, ask: "Is it difficult to stop?"      No bleeding, no swelling 7. SIZE: For cuts, bruises, or swelling, ask: "How large is it?" (e.g., inches or centimeters)      no 8. PAIN: "Is there any pain?" If so, ask: "How bad is it?"  (e.g., Scale 1-10; or mild, moderate, severe)     #6 9. TETANUS: For any breaks in the skin, ask: "When was the last tetanus booster?"     No breaks in the skin 10. OTHER SYMPTOMS: "Do you have any other symptoms?" (e.g., neck pain, vomiting)       Headache, nauseated, facial swelling, dental pain 11. PREGNANCY: "Is there any chance you are pregnant?" "When was your last menstrual period?"       No, LMP last week, has IUD  Protocols used: HEAD INJURY-A-AH

## 2017-11-01 NOTE — ED Provider Notes (Signed)
[facility] Emergency Department Provider Note   ____________________________________________    I have reviewed the triage vital signs and the nursing notes.   HISTORY  Chief Complaint Headache    HPI Christine Cook is a 42 y.o. female who presents with complaints of a headache.  Patient describes a throbbing frontal left-sided headache since Friday.  On Friday apparently she had a plastic ceiling tile fell from the ceiling at work onto the back of her head.  No LOC.  Not on blood thinners.  Has apparently had a headache since then.  Does have a history of migraines.  No neuro deficits fevers or chills.  No nasal congestion.  No neck pain.  Was seen in urgent care has been taking multiple medications without improvement.  Past Medical History:  Diagnosis Date  . Anxiety   . Benzodiazepine overdose   . Depression   . Diabetes ([place])   . Fatty liver disease, nonalcoholic   . Heart murmur   . Heart palpitations   . High cholesterol   . 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 Hypertension   . Interstitial cystitis   . Irritable bowel syndrome with diarrhea   . 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 Migraine   . 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 Sleep apnea with hypersomnolence    CPAP study 11-28-09,  10-02-2009 AHI was 16.5, pressure to   . Tachycardia     Patient Active Problem List   Diagnosis Date Noted  . Hyperlipidemia 08/05/2016  . Tachycardia 08/05/2016  . OSA (obstructive sleep apnea) 07/12/2016  . 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 Vitamin B12 deficiency 07/12/2016  . Vitamin D deficiency 07/12/2016  . Diabetes mellitus without complication ([place]) 89/21/1941  . 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 Impulse control disorder (gambling) 07/11/2016  . Hypertension 07/09/2016  . GERD 01/21/2009    Past Surgical History:  Procedure Laterality Date  . ADENOIDECTOMY    . BLADDER SURGERY    . CHOLECYSTECTOMY     . KNEE SURGERY     Left knee x 2   . LEEP  10/2016   CIN 1  . TONSILLECTOMY      Prior to Admission medications   Medication Sig Start Date End Date Taking? Authorizing Provider  atorvastatin (LIPITOR) 40 MG tablet Take 1 tablet (40 mg total) by mouth every evening. 10/05/17 10/05/18  Pleas Koch, NP  butalbital-acetaminophen-caffeine (FIORICET, ESGIC) [phone] MG tablet Take 1 tablet by mouth every 6 (six) hours as needed for headache. 10/29/17   Coral Spikes, DO  cholecalciferol 400 units tablet Take 1 tablet (400 Units total) by mouth daily. 07/20/16   Hildred Priest, MD  cyclobenzaprine (FLEXERIL) 10 MG tablet Take 1 tablet (10 mg total) by mouth at bedtime. prn 10/28/17   Norval Gable, MD  FLUoxetine (PROZAC) 20 MG capsule Take 60 mg by mouth daily.    Ahluwalia, Shamsher S, MD  glipiZIDE (GLUCOTROL) 5 MG tablet Take 1 tablet (5 mg total) by mouth 2 (two) times daily before a meal. 10/05/17   Pleas Koch, NP  levonorgestrel (MIRENA) 20 MCG/24HR IUD 1 each by Intrauterine route once.    [provider]  metFORMIN (GLUCOPHAGE XR) 500 MG 24 hr tablet Take 1 tablet (500 mg total) by mouth daily with breakfast. 10/05/17 10/05/18  Pleas Koch, NP  metoprolol tartrate (LOPRESSOR) 25 MG tablet Take 1 tablet (25 mg total) by mouth 2 (two)  times daily. 10/07/17   Pleas Koch, NP  pantoprazole (PROTONIX) 20 MG tablet Take 1 tablet (20 mg total) by mouth daily. 10/27/17   Pleas Koch, NP  potassium chloride (K-DUR,KLOR-CON) 10 MEQ tablet Take 1 tablet (10 mEq total) by mouth daily. 10/26/17   Pleas Koch, NP  QUEtiapine (SEROQUEL) 50 MG tablet Take 3 tablets (150 mg total) by mouth at bedtime. Patient taking differently: Take 100 mg by mouth at bedtime.  07/19/16   Hildred Priest, MD  venlafaxine (EFFEXOR) 75 MG tablet Take 150 mg by mouth daily.    [provider]  vitamin B-12 1000 MCG tablet Take 1 tablet (1,000 mcg total)  by mouth daily. 07/19/16   Hildred Priest, MD     Allergies Codeine; Morphine; and Neurontin [gabapentin]  Family History  Problem Relation Age of Onset  . Asthma Mother   . Hyperthyroidism Mother   . Rheum arthritis Mother   . Graves' disease Mother   . Sjogren's syndrome Mother   . Asthma Daughter   . Colon cancer Neg Hx     Social History Social History   Tobacco Use  . Smoking status: Former Smoker    Packs/day: 0.25    Years: 2.00    Pack years: 0.50    Types: Cigarettes    Last attempt to quit: 02/14/1995    Years since quitting: 22.7  . Smokeless tobacco: Never Used  Substance Use Topics  . Alcohol use: No    Frequency: Never  . Drug use: No    Review of Systems  Constitutional: No fever/chills Eyes: No visual changes.  ENT: No neck pain Cardiovascular: Denies chest pain. Respiratory: Denies shortness of breath. Gastrointestinal: No nausea, no vomiting.   Genitourinary: Negative for dysuria. Musculoskeletal: Negative for back pain. Skin: Negative for abrasion laceration Neurological: Negative for focal weakness   ____________________________________________   PHYSICAL EXAM:  VITAL SIGNS: ED Triage Vitals  Enc Vitals Group     BP 11/01/17 1057 133/81     Pulse Rate 11/01/17 1057 78     Resp 11/01/17 1057 16     Temp 11/01/17 1057 98.6 F (37 C)     Temp src --      SpO2 11/01/17 1057 98 %     Weight 11/01/17 1058 79.4 kg (175 lb)     Height 11/01/17 1058 1.651 m (5\' 5" )     Head Circumference --      Peak Flow --      Pain Score 11/01/17 1057 7     Pain Loc --      Pain Edu? --      Excl. in [place]? --     Constitutional: Alert and oriented. No acute distress.  Eyes: Conjunctivae are normal.  PERRLA, EOMI Head: Atraumatic.  No swelling or hematoma Nose: No congestion/rhinnorhea. Mouth/Throat: Mucous membranes are moist.   Neck:  Painless ROM, no vertebral tenderness palpation Cardiovascular: Normal rate, regular rhythm.    Good peripheral circulation. Respiratory: Normal respiratory effort.  No retractions.    Musculoskeletal:   Warm and well perfused Neurologic:  Normal speech and language. No gross focal neurologic deficits are appreciated.  Cranial nerves II through XII are normal Skin:  Skin is warm, dry and intact. No rash noted. Psychiatric: Mood and affect are normal. Speech and behavior are normal.  ____________________________________________   LABS (all labs ordered are listed, but only abnormal results are displayed)  Labs Reviewed - No data to display ____________________________________________  EKG  None ____________________________________________  RADIOLOGY  None ____________________________________________   PROCEDURES  Procedure(s) performed: No  Procedures   Critical Care performed: No ____________________________________________   INITIAL IMPRESSION / ASSESSMENT AND PLAN / ED COURSE  Pertinent labs & imaging results that were available during my care of the patient were reviewed by me and considered in my medical decision making (see chart for details).  Patient presents with headache for several days.  Apparently initiated by very minor head injury.  No neuro deficits or concern for intracranial injury.  Headache is left frontal, not contrecoup to the area of her injury.  This may be migraine.  We will treat with IV fluids, Reglan, Toradol, Benadryl and reevaluate.  Offered imaging to the patient but she declined  Patient aware that because of Benadryl unable to drive, she says she will have someone pick her up    ____________________________________________   FINAL CLINICAL IMPRESSION(S) / ED DIAGNOSES  Final diagnoses:  Other migraine without status migrainosus, not intractable        Note:  This document was prepared using Dragon voice recognition software and may include unintentional dictation errors.    Lavonia Drafts, MD 11/01/17 1251

## 2017-11-01 NOTE — ED Notes (Signed)
FN: pt c/o headache, states a ceiling tile fell on her head last week at work. Pt filing workman's comp.

## 2017-11-01 NOTE — Telephone Encounter (Signed)
Patient is currently in the ED.

## 2017-11-01 NOTE — Telephone Encounter (Signed)
Looks like she was evaluated at Urgent Care over the weekend, unremarkable exam that didn't warrant imaging such as CT. Is she felling any better since the incident? Are the flexeril and Fioricet helping?

## 2017-11-09 ENCOUNTER — Encounter: Payer: Self-pay | Admitting: Primary Care

## 2017-11-09 ENCOUNTER — Telehealth: Payer: Self-pay | Admitting: Primary Care

## 2017-11-09 ENCOUNTER — Ambulatory Visit (INDEPENDENT_AMBULATORY_CARE_PROVIDER_SITE_OTHER): Payer: 59 | Admitting: Primary Care

## 2017-11-09 VITALS — BP 120/72 | HR 82 | Temp 98.1°F | Ht 65.25 in | Wt 177.4 lb

## 2017-11-09 DIAGNOSIS — J069 Acute upper respiratory infection, unspecified: Secondary | ICD-10-CM

## 2017-11-09 MED ORDER — BENZONATATE 200 MG PO CAPS: 200.0000 mg | ORAL_CAPSULE | Freq: Three times a day (TID) | ORAL | 0 refills | Status: DC | PRN

## 2017-11-09 MED ORDER — GUAIFENESIN-CODEINE 100-10 MG/5ML PO SYRP: 5.0000 mL | ORAL_SOLUTION | Freq: Every evening | ORAL | 0 refills | Status: DC | PRN

## 2017-11-09 NOTE — Telephone Encounter (Signed)
Copied from [place] [id number]. Topic: Quick Communication - See Telephone Encounter >> Nov 09, 2017  4:10 PM Ivar Drape wrote: CRM for notification. See Telephone encounter for:  11/09/17. [place] would like for the PCP to know that they were only able to fill the guaiFENesin-codeine (ROBITUSSIN AC) 100-10 MG/5ML syrup medication for 105ml instead of the 19ml the doctor requested.

## 2017-11-09 NOTE — Patient Instructions (Signed)
Your symptoms are representative of a viral illness which will resolve on its own over time. Our goal is to treat your symptoms in order to aid your body in the healing process and to make you more comfortable.   You may take Benzonatate capsules for cough. Take 1 capsule by mouth three times daily as needed for cough.  Continue Advil or Tylenol for fevers. Continue Allegra.  Please notify me if you develop persistent fevers of 101 or higher, start coughing up green mucous after one week,  or feel worse after 10 days from onset of symptoms.   Increase consumption of water intake and rest.  It was a pleasure to see you today!   Upper Respiratory Infection, Adult Most upper respiratory infections (URIs) are caused by a virus. A URI affects the nose, throat, and upper air passages. The most common type of URI is often called "the common cold." Follow these instructions at home:  Take medicines only as told by your doctor.  Gargle warm saltwater or take cough drops to comfort your throat as told by your doctor.  Use a warm mist humidifier or inhale steam from a shower to increase air moisture. This may make it easier to breathe.  Drink enough fluid to keep your pee (urine) clear or pale yellow.  Eat soups and other clear broths.  Have a healthy diet.  Rest as needed.  Go back to work when your fever is gone or your doctor says it is okay. ? You may need to stay home longer to avoid giving your URI to others. ? You can also wear a face mask and wash your hands often to prevent spread of the virus.  Use your inhaler more if you have asthma.  Do not use any tobacco products, including cigarettes, chewing tobacco, or electronic cigarettes. If you need help quitting, ask your doctor. Contact a doctor if:  You are getting worse, not better.  Your symptoms are not helped by medicine.  You have chills.  You are getting more short of breath.  You have brown or red mucus.  You  have yellow or brown discharge from your nose.  You have pain in your face, especially when you bend forward.  You have a fever.  You have puffy (swollen) neck glands.  You have pain while swallowing.  You have white areas in the back of your throat. Get help right away if:  You have very bad or constant: ? Headache. ? Ear pain. ? Pain in your forehead, behind your eyes, and over your cheekbones (sinus pain). ? Chest pain.  You have long-lasting (chronic) lung disease and any of the following: ? Wheezing. ? Long-lasting cough. ? Coughing up blood. ? A change in your usual mucus.  You have a stiff neck.  You have changes in your: ? Vision. ? Hearing. ? Thinking. ? Mood. This information is not intended to replace advice given to you by your health care provider. Make sure you discuss any questions you have with your health care provider. Document Released: 03/08/2008 Document Revised: 05/23/2016 Document Reviewed: 12/26/2013 Elsevier Interactive Patient Education  2018 [employer].

## 2017-11-09 NOTE — Telephone Encounter (Signed)
Noted  

## 2017-11-09 NOTE — Addendum Note (Signed)
Addended by: Pleas Koch on: 11/09/2017 02:29 PM   Modules accepted: Orders

## 2017-11-09 NOTE — Progress Notes (Addendum)
Subjective:    Patient ID: Christine Cook, female    DOB: 02-27-1976, 42 y.o.   MRN: [id number]  HPI  Ms. Robertshaw is a 42 year old female with a history diabetes, OSA, GERD who presents today with a chief complaint of cough. She also reports sore throat, nasal congestion, sore throat, low grade fevers, chest congestion.   Her symptoms began 2 days ago. She denies shortness of breath, chest pain, sick contacts. She's been using Mucinex, benadryl, Allegra, Tylenol, Advil without improvement. Her fevers are running 99 to 100.   Review of Systems  Constitutional: Positive for fatigue and fever.  HENT: Positive for congestion and sore throat. Negative for ear pain and sinus pressure.   Respiratory: Positive for cough. Negative for shortness of breath.   Cardiovascular: Negative for chest pain.       Past Medical History:  Diagnosis Date  . Anxiety   . Benzodiazepine overdose   . Depression   . Diabetes ([place])   . Fatty liver disease, nonalcoholic   . Heart murmur   . Heart palpitations   . High cholesterol   . 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 Hypertension   . Interstitial cystitis   . Irritable bowel syndrome with diarrhea   . 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 Migraine   . 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 Sleep apnea with hypersomnolence    CPAP study 11-28-09,  10-02-2009 AHI was 16.5, pressure to   . Tachycardia      Social History   Socioeconomic History  . Marital status: Single    Spouse name: Not on file  . Number of children: 1  . Years of education: College  . Highest education level: Not on file  Social Needs  . Financial resource strain: Not on file  . Food insecurity - worry: Not on file  . Food insecurity - inability: Not on file  . Transportation needs - medical: Not on file  . Transportation needs - non-medical: Not on file  Occupational History  . Occupation: Support Firefighter: [employer]    Tobacco Use  . Smoking status: Former Smoker    Packs/day: 0.25    Years: 2.00    Pack years: 0.50    Types: Cigarettes    Last attempt to quit: 02/14/1995    Years since quitting: 22.7  . Smokeless tobacco: Never Used  Substance and Sexual Activity  . Alcohol use: No    Frequency: Never  . Drug use: No  . Sexual activity: No  Other Topics Concern  . Not on file  Social History Narrative   Single.   One daughter.   Works at [employer].   Enjoys relaxing, spending time with family.    Past Surgical History:  Procedure Laterality Date  . ADENOIDECTOMY    . BLADDER SURGERY    . CHOLECYSTECTOMY    . KNEE SURGERY     Left knee x 2   . LEEP  10/2016   CIN 1  . TONSILLECTOMY      Family History  Problem Relation Age of Onset  . Asthma Mother   . Hyperthyroidism Mother   . Rheum arthritis Mother   . Graves' disease Mother   . Sjogren's syndrome Mother   . Asthma Daughter   . Colon cancer Neg Hx     Allergies  Allergen Reactions  . Codeine Hives and Nausea And Vomiting  .  Morphine Hives  . Neurontin [Gabapentin] Other (See Comments)    syncope    Current Outpatient Medications on File Prior to Visit  Medication Sig Dispense Refill  . atorvastatin (LIPITOR) 40 MG tablet Take 1 tablet (40 mg total) by mouth every evening. 90 tablet 3  . butalbital-acetaminophen-caffeine (FIORICET, ESGIC) 50-325-40 MG tablet Take 1 tablet by mouth every 6 (six) hours as needed for headache. 20 tablet 0  . cholecalciferol 400 units tablet Take 1 tablet (400 Units total) by mouth daily. 30 each 0  . cyclobenzaprine (FLEXERIL) 10 MG tablet Take 1 tablet (10 mg total) by mouth at bedtime. prn 7 tablet 0  . FLUoxetine (PROZAC) 20 MG capsule Take 60 mg by mouth daily.    Marland Kitchen glipiZIDE (GLUCOTROL) 5 MG tablet Take 1 tablet (5 mg total) by mouth 2 (two) times daily before a meal. 180 tablet 3  . levonorgestrel (MIRENA) 20 MCG/24HR IUD 1 each by Intrauterine route once.    . metFORMIN  (GLUCOPHAGE XR) 500 MG 24 hr tablet Take 1 tablet (500 mg total) by mouth daily with breakfast. 90 tablet 1  . metoprolol tartrate (LOPRESSOR) 25 MG tablet Take 1 tablet (25 mg total) by mouth 2 (two) times daily. 180 tablet 1  . pantoprazole (PROTONIX) 20 MG tablet Take 1 tablet (20 mg total) by mouth daily. 90 tablet 0  . potassium chloride (K-DUR,KLOR-CON) 10 MEQ tablet Take 1 tablet (10 mEq total) by mouth daily. 30 tablet 0  . QUEtiapine (SEROQUEL) 50 MG tablet Take 3 tablets (150 mg total) by mouth at bedtime. (Patient taking differently: Take 100 mg by mouth at bedtime. ) 30 tablet 0  . venlafaxine (EFFEXOR) 75 MG tablet Take 150 mg by mouth daily.    . vitamin B-12 1000 MCG tablet Take 1 tablet (1,000 mcg total) by mouth daily. 30 tablet    No current facility-administered medications on file prior to visit.     BP 120/72   Pulse 82   Temp 98.1 F (36.7 C) (Oral)   Ht 5' 5.25" (1.657 m)   Wt 177 lb 6.4 oz (80.5 kg)   SpO2 98%   BMI 29.30 kg/m    Objective:   Physical Exam  Constitutional: She appears well-nourished.  HENT:  Right Ear: Tympanic membrane and ear canal normal.  Left Ear: Tympanic membrane and ear canal normal.  Nose: Right sinus exhibits no maxillary sinus tenderness and no frontal sinus tenderness. Left sinus exhibits no maxillary sinus tenderness and no frontal sinus tenderness.  Mouth/Throat: Oropharynx is clear and moist.  Eyes: Conjunctivae are normal.  Neck: Neck supple.  Cardiovascular: Normal rate and regular rhythm.  Pulmonary/Chest: Effort normal and breath sounds normal. She has no wheezes. She has no rales.  Lymphadenopathy:    She has no cervical adenopathy.  Skin: Skin is warm and dry.          Assessment & Plan:  URI:  Cough, congestion, low grade fevers x 2 days. Exam today overall unremarkable.  Suspect viral involvement and will treat with conservative measures. Rx for benzonatate capsules TID PRN and Cheratussin HS sent to  pharmacy. Continue Allegra, Advil or Tylenol. Fluids, rest, precautions provided.  Pleas Koch, NP

## 2017-11-15 MED ORDER — AZITHROMYCIN 250 MG PO TABS: ORAL_TABLET | ORAL | 0 refills | Status: DC

## 2017-11-23 ENCOUNTER — Other Ambulatory Visit: Payer: Self-pay | Admitting: Primary Care

## 2017-11-23 DIAGNOSIS — E876 Hypokalemia: Secondary | ICD-10-CM

## 2017-11-24 NOTE — Telephone Encounter (Signed)
Refill sent to pharmacy, due for repeat potassium check on 11/28/17

## 2017-11-24 NOTE — Telephone Encounter (Signed)
Ok to refill? Electronically refill request for potassium chloride (K-DUR,KLOR-CON) 10 MEQ tablet  Last prescribed on 10/26/2017. Last seen on 11/09/2017

## 2017-11-28 ENCOUNTER — Ambulatory Visit: Payer: Self-pay | Admitting: Primary Care

## 2017-11-29 ENCOUNTER — Encounter: Payer: Self-pay | Admitting: Primary Care

## 2017-11-29 ENCOUNTER — Encounter: Payer: Self-pay | Admitting: *Deleted

## 2017-11-29 ENCOUNTER — Ambulatory Visit (INDEPENDENT_AMBULATORY_CARE_PROVIDER_SITE_OTHER): Payer: 59 | Admitting: Primary Care

## 2017-11-29 VITALS — BP 110/70 | HR 91 | Temp 97.7°F | Ht 65.25 in | Wt 172.5 lb

## 2017-11-29 DIAGNOSIS — J069 Acute upper respiratory infection, unspecified: Secondary | ICD-10-CM

## 2017-11-29 DIAGNOSIS — E876 Hypokalemia: Secondary | ICD-10-CM | POA: Diagnosis not present

## 2017-11-29 LAB — POTASSIUM: Potassium: 3.6 mEq/L (ref 3.5–5.1)

## 2017-11-29 NOTE — Assessment & Plan Note (Signed)
Managed and compliant to potassium 10 mEq daily. Repeat potassium pending today.

## 2017-11-29 NOTE — Progress Notes (Signed)
Subjective:    Patient ID: Christine Cook, female    DOB: [birth year], 42 y.o.   MRN: [id number]  HPI  Ms. Knarr is a 42 year old female with a history of hypertension, GERD, OSA, type 2 diabetes who presents today with a chief complaint of cough. She also reports body aches, nasal congestion, headaches, sore throat. She is also due for repeat potassium check. She is also needing an updated Tdap as her daughter will be delivering her grandson in May 2019.  Her symptoms began 24 hours ago with sore throat and congestion. Her symptoms were tolerable during the day yesterday, then much worse at night. She woke this morning with sweats/chills. She's been taking Tylenol and Benadryl. Her cough is non productive. She works in [employer] and is exposed to numerous sick people.   She was last evaluated on 11/09/17 for similar symptoms. She was diagnosed for viral URI and treated with conservative measures.   Review of Systems  Constitutional: Positive for chills and fatigue.  HENT: Positive for congestion, postnasal drip and sore throat.   Respiratory: Positive for cough. Negative for shortness of breath.   Cardiovascular: Negative for chest pain.       Past Medical History:  Diagnosis Date  . Anxiety   . Benzodiazepine overdose   . Depression   . Diabetes ([place])   . Fatty liver disease, nonalcoholic   . Heart murmur   . Heart palpitations   . High cholesterol   . 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 Hypertension   . Interstitial cystitis   . Irritable bowel syndrome with diarrhea   . 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 Migraine   . 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 Sleep apnea with hypersomnolence    CPAP study 11-28-09,  10-02-2009 AHI was 16.5, pressure to   . Tachycardia      Social History   Socioeconomic History  . Marital status: Single    Spouse name: Not on file  . Number of children: 1  . Years of education: College  . Highest  education level: Not on file  Social Needs  . Financial resource strain: Not on file  . Food insecurity - worry: Not on file  . Food insecurity - inability: Not on file  . Transportation needs - medical: Not on file  . Transportation needs - non-medical: Not on file  Occupational History  . 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 Smoking status: Former Smoker    Packs/day: 0.25    Years: 2.00    Pack years: 0.50    Types: Cigarettes    Last attempt to quit: 02/14/1995    Years since quitting: 22.8  . Smokeless tobacco: Never Used  Substance and Sexual Activity  . Alcohol use: No    Frequency: Never  . Drug use: No  . Sexual activity: No  Other Topics Concern  . Not on file  Social History Narrative   Single.   One daughter.   Works at [employer].   Enjoys relaxing, spending time with family.    Past Surgical History:  Procedure Laterality Date  . ADENOIDECTOMY    . BLADDER SURGERY    . CHOLECYSTECTOMY    . KNEE SURGERY     Left knee x 2   . LEEP  10/2016   CIN 1  . TONSILLECTOMY      Family History  Problem Relation Age of Onset  . Asthma Mother   . Hyperthyroidism Mother   . Rheum arthritis Mother   . Graves' disease Mother   . Sjogren's syndrome Mother   . Asthma Daughter   . Colon cancer Neg Hx     Allergies  Allergen Reactions  . Codeine Hives and Nausea And Vomiting  . Morphine Hives  . Neurontin [Gabapentin] Other (See Comments)    syncope    Current Outpatient Medications on File Prior to Visit  Medication Sig Dispense Refill  . atorvastatin (LIPITOR) 40 MG tablet Take 1 tablet (40 mg total) by mouth every evening. 90 tablet 3  . butalbital-acetaminophen-caffeine (FIORICET, ESGIC) 50-325-40 MG tablet Take 1 tablet by mouth every 6 (six) hours as needed for headache. 20 tablet 0  . cholecalciferol 400 units tablet Take 1 tablet (400 Units total) by mouth daily. 30 each 0  . cyclobenzaprine (FLEXERIL) 10 MG  tablet Take 1 tablet (10 mg total) by mouth at bedtime. prn 7 tablet 0  . FLUoxetine (PROZAC) 20 MG capsule Take 60 mg by mouth daily.    Marland Kitchen glipiZIDE (GLUCOTROL) 5 MG tablet Take 1 tablet (5 mg total) by mouth 2 (two) times daily before a meal. 180 tablet 3  . guaiFENesin-codeine (ROBITUSSIN AC) 100-10 MG/5ML syrup Take 5 mLs by mouth at bedtime as needed for cough. 50 mL 0  . KLOR-CON M10 10 MEQ tablet TAKE 1 TABLET BY MOUTH ONCE DAILY 90 tablet 0  . levonorgestrel (MIRENA) 20 MCG/24HR IUD 1 each by Intrauterine route once.    . metFORMIN (GLUCOPHAGE XR) 500 MG 24 hr tablet Take 1 tablet (500 mg total) by mouth daily with breakfast. 90 tablet 1  . metoprolol tartrate (LOPRESSOR) 25 MG tablet Take 1 tablet (25 mg total) by mouth 2 (two) times daily. 180 tablet 1  . pantoprazole (PROTONIX) 20 MG tablet Take 1 tablet (20 mg total) by mouth daily. 90 tablet 0  . QUEtiapine (SEROQUEL) 50 MG tablet Take 3 tablets (150 mg total) by mouth at bedtime. (Patient taking differently: Take 100 mg by mouth at bedtime. ) 30 tablet 0  . venlafaxine (EFFEXOR) 75 MG tablet Take 150 mg by mouth daily.    . vitamin B-12 1000 MCG tablet Take 1 tablet (1,000 mcg total) by mouth daily. 30 tablet    No current facility-administered medications on file prior to visit.     BP 110/70   Pulse 91   Temp 97.7 F (36.5 C) (Oral)   Ht 5' 5.25" (1.657 m)   Wt 172 lb 8 oz (78.2 kg)   SpO2 99%   BMI 28.49 kg/m    Objective:   Physical Exam  Constitutional: She appears well-nourished. She does not appear ill.  HENT:  Right Ear: Tympanic membrane and ear canal normal.  Left Ear: Tympanic membrane and ear canal normal.  Nose: Right sinus exhibits no maxillary sinus tenderness and no frontal sinus tenderness. Left sinus exhibits no maxillary sinus tenderness and no frontal sinus tenderness.  Mouth/Throat: Oropharynx is clear and moist.  Eyes: Conjunctivae are normal.  Neck: Neck supple.  Cardiovascular: Normal rate  and regular rhythm.  Pulmonary/Chest: Effort normal and breath sounds normal. She has no wheezes. She has no rales.  Lymphadenopathy:    She has no cervical adenopathy.  Skin: Skin is warm and dry.          Assessment & Plan:  URI:  Chills, body aches, fatigue, cough x 24 hours. Exam today  with clear lungs, doesn't appear ill, normal vitals. Rapid Flu: Negative Do suspect viral involvement and will treat with conservative measures.  Tylenol for body aches/headaches, Delsym or Robitussin PRN, discussed Mucinex and Flonase. Fluids, rest, follow up PRN.  Pleas Koch, NP

## 2017-11-29 NOTE — Patient Instructions (Addendum)
Stop by the lab prior to leaving today. I will notify you of your results once received.   Schedule a nurse visit to return for your Tetanus vaccination when you're feeling better.  Your symptoms are representative of a viral illness which will resolve on its own over time. Our goal is to treat your symptoms in order to aid your body in the healing process and to make you more comfortable.   Continue Tylenol for body aches and headaches. Okay to use Delsym or Robutissin for cough.  Chest Congestion: Try taking Mucinex. This will help loosen up the mucous in your chest. Ensure you take this medication with a full glass of water.  Nasal Congestion/Ear Pressure: Try using Flonase (fluticasone) nasal spray. Instill 1 spray in each nostril twice daily.   It was a pleasure to see you today!    Cough, Adult Coughing is a reflex that clears your throat and your airways. Coughing helps to heal and protect your lungs. It is normal to cough occasionally, but a cough that happens with other symptoms or lasts a long time may be a sign of a condition that needs treatment. A cough may last only 2-3 weeks (acute), or it may last longer than 8 weeks (chronic). What are the causes? Coughing is commonly caused by:  Breathing in substances that irritate your lungs.  A viral or bacterial respiratory infection.  Allergies.  Asthma.  Postnasal drip.  Smoking.  Acid backing up from the stomach into the esophagus (gastroesophageal reflux).  Certain medicines.  Chronic lung problems, including COPD (or rarely, lung cancer).  Other medical conditions such as heart failure.  Follow these instructions at home: Pay attention to any changes in your symptoms. Take these actions to help with your discomfort:  Take medicines only as told by your health care provider. ? If you were prescribed an antibiotic medicine, take it as told by your health care provider. Do not stop taking the antibiotic even if you  start to feel better. ? Talk with your health care provider before you take a cough suppressant medicine.  Drink enough fluid to keep your urine clear or pale yellow.  If the air is dry, use a cold steam vaporizer or humidifier in your bedroom or your home to help loosen secretions.  Avoid anything that causes you to cough at work or at home.  If your cough is worse at night, try sleeping in a semi-upright position.  Avoid cigarette smoke. If you smoke, quit smoking. If you need help quitting, ask your health care provider.  Avoid caffeine.  Avoid alcohol.  Rest as needed.  Contact a health care provider if:  You have new symptoms.  You cough up pus.  Your cough does not get better after 2-3 weeks, or your cough gets worse.  You cannot control your cough with suppressant medicines and you are losing sleep.  You develop pain that is getting worse or pain that is not controlled with pain medicines.  You have a fever.  You have unexplained weight loss.  You have night sweats. Get help right away if:  You cough up blood.  You have difficulty breathing.  Your heartbeat is very fast. This information is not intended to replace advice given to you by your health care provider. Make sure you discuss any questions you have with your health care provider. Document Released: 03/19/2011 Document Revised: 02/26/2016 Document Reviewed: 11/27/2014 Elsevier Interactive Patient Education  [employer].

## 2017-11-30 ENCOUNTER — Ambulatory Visit: Payer: 59 | Admitting: Primary Care

## 2017-12-01 ENCOUNTER — Encounter: Payer: Self-pay | Admitting: Primary Care

## 2017-12-01 NOTE — Telephone Encounter (Signed)
Copied from [place]. Topic: Quick Communication - See Telephone Encounter >> Dec 01, 2017  3:23 PM Ivar Drape wrote: CRM for notification. See Telephone encounter for:  12/01/17.  Patient called asking for extended time to stay off of work because she is not feeling any better.  Anda Kraft is gone for the day, but the note said the patient has to return to work tomorrow. She works in [employer] and they can't be sick and be around the customers, but they do require a doctor's note.  Can anyone else write the note to extend the off work time.  Please advise.  [phone]

## 2017-12-02 ENCOUNTER — Encounter: Payer: Self-pay | Admitting: Primary Care

## 2017-12-05 ENCOUNTER — Encounter: Payer: Self-pay | Admitting: Primary Care

## 2017-12-06 ENCOUNTER — Other Ambulatory Visit: Payer: Self-pay

## 2017-12-06 ENCOUNTER — Ambulatory Visit
Admission: EM | Admit: 2017-12-06 | Discharge: 2017-12-06 | Disposition: A | Discharge status: Home / Self Care | Payer: 59 | Attending: Family Medicine | Admitting: Family Medicine

## 2017-12-06 DIAGNOSIS — J0100 Acute maxillary sinusitis, unspecified: Secondary | ICD-10-CM

## 2017-12-06 MED ORDER — AMOXICILLIN-POT CLAVULANATE 875-125 MG PO TABS: 1.0000 | ORAL_TABLET | Freq: Two times a day (BID) | ORAL | 0 refills | Status: DC

## 2017-12-06 MED ORDER — FLUCONAZOLE 150 MG PO TABS: 150.0000 mg | ORAL_TABLET | Freq: Every day | ORAL | 0 refills | Status: DC

## 2017-12-06 NOTE — ED Provider Notes (Signed)
[facility]    CSN: [id number] Arrival date & time: 12/06/17  1325     History   Chief Complaint Chief Complaint  Patient presents with  . Sinusitis    HPI Christine Cook is a 42 y.o. female.   The history is provided by the patient.  Sinusitis  Associated symptoms: congestion and fatigue   Associated symptoms: no wheezing   URI  Presenting symptoms: congestion, facial pain and fatigue   Severity:  Moderate Onset quality:  Sudden Duration:  1 month Timing:  Constant Progression:  Worsening Chronicity:  New Relieved by:  Nothing Ineffective treatments:  Prescription medications and OTC medications Associated symptoms: sinus pain   Associated symptoms: no wheezing   Risk factors: diabetes mellitus, recent illness and sick contacts   Risk factors: not elderly, no chronic cardiac disease, no chronic kidney disease, no chronic respiratory disease, no immunosuppression and no recent travel     Past Medical History:  Diagnosis Date  . Anxiety   . Benzodiazepine overdose   . Depression   . Diabetes ([place])   . Fatty liver disease, nonalcoholic   . Heart murmur   . Heart palpitations   . High cholesterol   . 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 Hypertension   . Interstitial cystitis   . Irritable bowel syndrome with diarrhea   . 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 Migraine   . 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 Sleep apnea with hypersomnolence    CPAP study 11-28-09,  10-02-2009 AHI was 16.5, pressure to   . Tachycardia     Patient Active Problem List   Diagnosis Date Noted  . Hypokalemia 11/29/2017  . Hyperlipidemia 08/05/2016  . Tachycardia 08/05/2016  . OSA (obstructive sleep apnea) 07/12/2016  . 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 Vitamin B12 deficiency 07/12/2016  . Vitamin D deficiency 07/12/2016  . Diabetes mellitus without complication ([place]) 60/07/9322  . 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 Impulse control  disorder (gambling) 07/11/2016  . Hypertension 07/09/2016  . GERD 01/21/2009    Past Surgical History:  Procedure Laterality Date  . ADENOIDECTOMY    . BLADDER SURGERY    . CHOLECYSTECTOMY    . KNEE SURGERY     Left knee x 2   . LEEP  10/2016   CIN 1  . TONSILLECTOMY      OB History    No data available       Home Medications    Prior to Admission medications   Medication Sig Start Date End Date Taking? Authorizing Provider  atorvastatin (LIPITOR) 40 MG tablet Take 1 tablet (40 mg total) by mouth every evening. 10/05/17 10/05/18 Yes Pleas Koch, NP  cholecalciferol 400 units tablet Take 1 tablet (400 Units total) by mouth daily. 07/20/16  Yes Hildred Priest, MD  FLUoxetine (PROZAC) 20 MG capsule Take 60 mg by mouth daily.   Yes Ahluwalia, Shamsher S, MD  glipiZIDE (GLUCOTROL) 5 MG tablet Take 1 tablet (5 mg total) by mouth 2 (two) times daily before a meal. 10/05/17  Yes Pleas Koch, NP  KLOR-CON M10 10 MEQ tablet TAKE 1 TABLET BY MOUTH ONCE DAILY 11/24/17  Yes Pleas Koch, NP  levonorgestrel (MIRENA) 20 MCG/24HR IUD 1 each by Intrauterine route once.   Yes [provider]  metFORMIN (GLUCOPHAGE XR) 500 MG 24 hr tablet Take 1 tablet (500 mg total) by mouth daily with breakfast. 10/05/17 10/05/18 Yes Pleas Koch,  NP  metoprolol tartrate (LOPRESSOR) 25 MG tablet Take 1 tablet (25 mg total) by mouth 2 (two) times daily. 10/07/17  Yes Pleas Koch, NP  QUEtiapine (SEROQUEL) 50 MG tablet Take 3 tablets (150 mg total) by mouth at bedtime. Patient taking differently: Take 100 mg by mouth at bedtime.  07/19/16  Yes Hildred Priest, MD  venlafaxine (EFFEXOR) 75 MG tablet Take 150 mg by mouth daily.   Yes [provider]  vitamin B-12 1000 MCG tablet Take 1 tablet (1,000 mcg total) by mouth daily. 07/19/16  Yes Hildred Priest, MD  amoxicillin-clavulanate (AUGMENTIN) 875-125 MG tablet Take 1 tablet by mouth 2 (two)  times daily. 12/06/17   Norval Gable, MD  butalbital-acetaminophen-caffeine (FIORICET, ESGIC) [phone] MG tablet Take 1 tablet by mouth every 6 (six) hours as needed for headache. 10/29/17   Coral Spikes, DO  cyclobenzaprine (FLEXERIL) 10 MG tablet Take 1 tablet (10 mg total) by mouth at bedtime. prn 10/28/17   Norval Gable, MD  fluconazole (DIFLUCAN) 150 MG tablet Take 1 tablet (150 mg total) by mouth daily. 12/06/17   Norval Gable, MD  guaiFENesin-codeine [facility]) 100-10 MG/5ML syrup Take 5 mLs by mouth at bedtime as needed for cough. 11/09/17   Pleas Koch, NP  pantoprazole (PROTONIX) 20 MG tablet Take 1 tablet (20 mg total) by mouth daily. 10/27/17   Pleas Koch, NP    Family History Family History  Problem Relation Age of Onset  . Asthma Mother   . Hyperthyroidism Mother   . Rheum arthritis Mother   . Graves' disease Mother   . Sjogren's syndrome Mother   . Asthma Daughter   . Colon cancer Neg Hx     Social History Social History   Tobacco Use  . Smoking status: Former Smoker    Packs/day: 0.25    Years: 2.00    Pack years: 0.50    Types: Cigarettes    Last attempt to quit: 02/14/1995    Years since quitting: 22.8  . Smokeless tobacco: Never Used  Substance Use Topics  . Alcohol use: No    Frequency: Never  . Drug use: No     Allergies   Codeine; Morphine; and Neurontin [gabapentin]   Review of Systems Review of Systems  Constitutional: Positive for fatigue.  HENT: Positive for congestion and sinus pain.   Respiratory: Negative for wheezing.      Physical Exam Triage Vital Signs ED Triage Vitals  Enc Vitals Group     BP 12/06/17 1338 124/84     Pulse Rate 12/06/17 1338 86     Resp 12/06/17 1338 18     Temp 12/06/17 1338 98.1 F (36.7 C)     Temp Source 12/06/17 1338 Oral     SpO2 12/06/17 1338 99 %     Weight 12/06/17 1337 172 lb (78 kg)     Height 12/06/17 1337 5' 5.25" (1.657 m)     Head Circumference --      Peak Flow --       Pain Score 12/06/17 1337 5     Pain Loc --      Pain Edu? --      Excl. in [place]? --    No data found.  Updated Vital Signs BP 124/84 (BP Location: Left Arm)   Pulse 86   Temp 98.1 F (36.7 C) (Oral)   Resp 18   Ht 5' 5.25" (1.657 m)   Wt 172 lb (78 kg)  SpO2 99%   BMI 28.40 kg/m   Visual Acuity Right Eye Distance:   Left Eye Distance:   Bilateral Distance:    Right Eye Near:   Left Eye Near:    Bilateral Near:     Physical Exam  Constitutional: She appears well-developed and well-nourished. No distress.  HENT:  Head: Normocephalic and atraumatic.  Right Ear: Tympanic membrane, external ear and ear canal normal.  Left Ear: Tympanic membrane, external ear and ear canal normal.  Nose: Mucosal edema and rhinorrhea present. No nose lacerations, sinus tenderness, nasal deformity, septal deviation or nasal septal hematoma. No epistaxis.  No foreign bodies. Right sinus exhibits maxillary sinus tenderness and frontal sinus tenderness. Left sinus exhibits maxillary sinus tenderness and frontal sinus tenderness.  Mouth/Throat: Uvula is midline, oropharynx is clear and moist and mucous membranes are normal. No oropharyngeal exudate.  Eyes: Conjunctivae and EOM are normal. Pupils are equal, round, and reactive to light. Right eye exhibits no discharge. Left eye exhibits no discharge. No scleral icterus.  Neck: Normal range of motion. Neck supple. No thyromegaly present.  Cardiovascular: Normal rate, regular rhythm and normal heart sounds.  Pulmonary/Chest: Effort normal and breath sounds normal. No respiratory distress. She has no wheezes. She has no rales.  Lymphadenopathy:    She has no cervical adenopathy.  Skin: She is not diaphoretic.  Nursing note and vitals reviewed.    UC Treatments / Results  Labs (all labs ordered are listed, but only abnormal results are displayed) Labs Reviewed - No data to display  EKG  EKG Interpretation None       Radiology No results  found.  Procedures Procedures (including critical care time)  Medications Ordered in UC Medications - No data to display   Initial Impression / Assessment and Plan / UC Course  I have reviewed the triage vital signs and the nursing notes.  Pertinent labs & imaging results that were available during my care of the patient were reviewed by me and considered in my medical decision making (see chart for details).       Final Clinical Impressions(s) / UC Diagnoses   Final diagnoses:  Acute maxillary sinusitis, recurrence not specified    ED Discharge Orders        Ordered    amoxicillin-clavulanate (AUGMENTIN) 875-125 MG tablet  2 times daily     12/06/17 1438    fluconazole (DIFLUCAN) 150 MG tablet  Daily     12/06/17 1438     1. diagnosis reviewed with patient 2. rx as per orders above; reviewed possible side effects, interactions, risks and benefits  3. Recommend supportive treatment with rest, fluids, otc flonase, otc cough med  4. Follow-up prn if symptoms worsen or don't improve  Controlled Substance Prescriptions [place] Controlled Substance Registry consulted? Not Applicable   Norval Gable, MD 12/06/17 1450

## 2017-12-06 NOTE — ED Triage Notes (Signed)
Patient complains of sinus pain and pressure that has been worsening. Patient states that she saw her primary md and was treated with azithromycin. Patient states that symptoms have not improved.

## 2017-12-08 ENCOUNTER — Ambulatory Visit: Payer: 59 | Admitting: Primary Care

## 2017-12-14 ENCOUNTER — Ambulatory Visit: Payer: 59 | Admitting: Primary Care

## 2017-12-17 ENCOUNTER — Encounter: Payer: Self-pay | Admitting: Primary Care

## 2017-12-17 DIAGNOSIS — E785 Hyperlipidemia, unspecified: Secondary | ICD-10-CM

## 2017-12-19 MED ORDER — ROSUVASTATIN CALCIUM 10 MG PO TABS: ORAL_TABLET | ORAL | 0 refills | Status: DC

## 2018-01-02 ENCOUNTER — Ambulatory Visit (INDEPENDENT_AMBULATORY_CARE_PROVIDER_SITE_OTHER): Payer: 59 | Admitting: Primary Care

## 2018-01-02 ENCOUNTER — Encounter: Payer: Self-pay | Admitting: Primary Care

## 2018-01-02 VITALS — BP 118/70 | HR 72 | Temp 98.2°F | Ht 65.25 in | Wt 172.5 lb

## 2018-01-02 DIAGNOSIS — M545 Low back pain, unspecified: Secondary | ICD-10-CM

## 2018-01-02 DIAGNOSIS — M549 Dorsalgia, unspecified: Secondary | ICD-10-CM | POA: Insufficient documentation

## 2018-01-02 DIAGNOSIS — K219 Gastro-esophageal reflux disease without esophagitis: Secondary | ICD-10-CM | POA: Diagnosis not present

## 2018-01-02 DIAGNOSIS — E782 Mixed hyperlipidemia: Secondary | ICD-10-CM

## 2018-01-02 DIAGNOSIS — G8929 Other chronic pain: Secondary | ICD-10-CM

## 2018-01-02 DIAGNOSIS — L659 Nonscarring hair loss, unspecified: Secondary | ICD-10-CM

## 2018-01-02 DIAGNOSIS — E119 Type 2 diabetes mellitus without complications: Secondary | ICD-10-CM | POA: Diagnosis not present

## 2018-01-02 DIAGNOSIS — M722 Plantar fascial fibromatosis: Secondary | ICD-10-CM | POA: Diagnosis not present

## 2018-01-02 DIAGNOSIS — E559 Vitamin D deficiency, unspecified: Secondary | ICD-10-CM

## 2018-01-02 DIAGNOSIS — Z23 Encounter for immunization: Secondary | ICD-10-CM | POA: Diagnosis not present

## 2018-01-02 DIAGNOSIS — E876 Hypokalemia: Secondary | ICD-10-CM

## 2018-01-02 LAB — LIPID PANEL
Cholesterol: 253 mg/dL — ABNORMAL HIGH (ref 0–200)
HDL: 29.3 mg/dL — ABNORMAL LOW (ref >39.00)
Total CHOL/HDL Ratio: 9
Triglycerides: 439 mg/dL — ABNORMAL HIGH (ref 0.0–149.0)

## 2018-01-02 LAB — VITAMIN D 25 HYDROXY (VIT D DEFICIENCY, FRACTURES): VITD: 35.88 ng/mL (ref 30.00–100.00)

## 2018-01-02 LAB — BASIC METABOLIC PANEL
BUN: 7 mg/dL (ref 6–23)
CO2: 27 mEq/L (ref 19–32)
Calcium: 9.4 mg/dL (ref 8.4–10.5)
Chloride: 104 mEq/L (ref 96–112)
Creatinine, Ser: 0.68 mg/dL (ref 0.40–1.20)
GFR: 100.57 mL/min (ref >60.00)
Glucose, Bld: 68 mg/dL — ABNORMAL LOW (ref 70–99)
Potassium: 3.7 mEq/L (ref 3.5–5.1)
Sodium: 139 mEq/L (ref 135–145)

## 2018-01-02 LAB — TSH: TSH: 2.16 u[IU]/mL (ref 0.35–4.50)

## 2018-01-02 LAB — LDL CHOLESTEROL, DIRECT: Direct LDL: 164 mg/dL

## 2018-01-02 LAB — HEMOGLOBIN A1C: Hgb A1c MFr Bld: 7.4 % — ABNORMAL HIGH (ref 4.6–6.5)

## 2018-01-02 MED ORDER — PANTOPRAZOLE SODIUM 40 MG PO TBEC: 40.0000 mg | DELAYED_RELEASE_TABLET | Freq: Every day | ORAL | 0 refills | Status: DC

## 2018-01-02 NOTE — Assessment & Plan Note (Signed)
Chronic for months, worse over last several months. Statin medication could very well be contributing but also consider work environment and long hours standing on concrete.   Referral placed to orthopedics for evaluation of plantar fasciitis. Back exercises provided today. Will try to lower dose of Crestor, if possible, once lipid panel returns.

## 2018-01-02 NOTE — Assessment & Plan Note (Signed)
Repeat potassium pending. Compliant to potassium 10 mEQ daily.

## 2018-01-02 NOTE — Assessment & Plan Note (Signed)
Repeat levels pending.

## 2018-01-02 NOTE — Assessment & Plan Note (Signed)
Continued symptoms with pantoprazole 20 mg, will increase to 40 mg daily x 12 weeks with a goal to reduce down to 20 mg if possible. Consider GI evaluation if symptoms persist on pantoprazole 40 mg.

## 2018-01-02 NOTE — Assessment & Plan Note (Signed)
Symptoms very characteristic of plantar fasciitis, especially given work conditions and constraints. Will send to podiatry for further evaluation and perhaps orthotics given no improvement with conservative treatment.

## 2018-01-02 NOTE — Assessment & Plan Note (Signed)
Myalgias/arthralgias could be partly due to statin, however, other contributing factors could be contributing. Repeat lipdis pending, consider reducing Crestor to 5 mg if able.

## 2018-01-02 NOTE — Patient Instructions (Addendum)
Stop by the lab prior to leaving today. I will notify you of your results once received.   You will be contacted regarding your referral to Podiatry.  Please let us know if you have not been contacted within one week.   We've increased your pantoprazole to 40 mg for the next 3 months, we'll try to reduce back down to 20 mg thereafter.   Try back stretching exercises daily to decrease pain. Take a look at the information below.  Please schedule a follow up appointment in 3 months.  It was a pleasure to see you today!   Back Exercises If you have pain in your back, do these exercises 2-3 times each day or as told by your doctor. When the pain goes away, do the exercises once each day, but repeat the steps more times for each exercise (do more repetitions). If you do not have pain in your back, do these exercises once each day or as told by your doctor. Exercises Single Knee to Chest  Do these steps 3-5 times in a row for each leg: 1. Lie on your back on a firm bed or the floor with your legs stretched out. 2. Bring one knee to your chest. 3. Hold your knee to your chest by grabbing your knee or thigh. 4. Pull on your knee until you feel a gentle stretch in your lower back. 5. Keep doing the stretch for 10-30 seconds. 6. Slowly let go of your leg and straighten it.  Pelvic Tilt  Do these steps 5-10 times in a row: 1. Lie on your back on a firm bed or the floor with your legs stretched out. 2. Bend your knees so they point up to the ceiling. Your feet should be flat on the floor. 3. Tighten your lower belly (abdomen) muscles to press your lower back against the floor. This will make your tailbone point up to the ceiling instead of pointing down to your feet or the floor. 4. Stay in this position for 5-10 seconds while you gently tighten your muscles and breathe evenly.  Cat-Cow  Do these steps until your lower back bends more easily: 1. Get on your hands and knees on a firm surface.  Keep your hands under your shoulders, and keep your knees under your hips. You may put padding under your knees. 2. Let your head hang down, and make your tailbone point down to the floor so your lower back is round like the back of a cat. 3. Stay in this position for 5 seconds. 4. Slowly lift your head and make your tailbone point up to the ceiling so your back hangs low (sags) like the back of a cow. 5. Stay in this position for 5 seconds.  Press-Ups  Do these steps 5-10 times in a row: 1. Lie on your belly (face-down) on the floor. 2. Place your hands near your head, about shoulder-width apart. 3. While you keep your back relaxed and keep your hips on the floor, slowly straighten your arms to raise the top half of your body and lift your shoulders. Do not use your back muscles. To make yourself more comfortable, you may change where you place your hands. 4. Stay in this position for 5 seconds. 5. Slowly return to lying flat on the floor.  Bridges  Do these steps 10 times in a row: 1. Lie on your back on a firm surface. 2. Bend your knees so they point up to the ceiling. Your feet  should be flat on the floor. 3. Tighten your butt muscles and lift your butt off of the floor until your waist is almost as high as your knees. If you do not feel the muscles working in your butt and the back of your thighs, slide your feet 1-2 inches farther away from your butt. 4. Stay in this position for 3-5 seconds. 5. Slowly lower your butt to the floor, and let your butt muscles relax.  If this exercise is too easy, try doing it with your arms crossed over your chest. Belly Crunches  Do these steps 5-10 times in a row: 1. Lie on your back on a firm bed or the floor with your legs stretched out. 2. Bend your knees so they point up to the ceiling. Your feet should be flat on the floor. 3. Cross your arms over your chest. 4. Tip your chin a little bit toward your chest but do not bend your  neck. 5. Tighten your belly muscles and slowly raise your chest just enough to lift your shoulder blades a tiny bit off of the floor. 6. Slowly lower your chest and your head to the floor.  Back Lifts Do these steps 5-10 times in a row: 1. Lie on your belly (face-down) with your arms at your sides, and rest your forehead on the floor. 2. Tighten the muscles in your legs and your butt. 3. Slowly lift your chest off of the floor while you keep your hips on the floor. Keep the back of your head in line with the curve in your back. Look at the floor while you do this. 4. Stay in this position for 3-5 seconds. 5. Slowly lower your chest and your face to the floor.  Contact a doctor if:  Your back pain gets a lot worse when you do an exercise.  Your back pain does not lessen 2 hours after you exercise. If you have any of these problems, stop doing the exercises. Do not do them again unless your doctor says it is okay. Get help right away if:  You have sudden, very bad back pain. If this happens, stop doing the exercises. Do not do them again unless your doctor says it is okay. This information is not intended to replace advice given to you by your health care provider. Make sure you discuss any questions you have with your health care provider. Document Released: 10/23/2010 Document Revised: 02/26/2016 Document Reviewed: 11/14/2014 Elsevier Interactive Patient Education  [employer].

## 2018-01-02 NOTE — Addendum Note (Signed)
Addended by: Jacqualin Combes on: 01/02/2018 04:26 PM   Modules accepted: Orders

## 2018-01-02 NOTE — Progress Notes (Signed)
Subjective:    Patient ID: Christine Cook, female    DOB: 01/31/1976, 42 y.o.   MRN: [id number]  HPI  Ms. Pennisi is a 42 year old female who presents today with multiple complaints. She is also needing a Tdap.  1) Myalgias: Located to the bilateral lower extremities, mid lower back pain, heel pain. She stands on a concrete floor for 8-10 hours 5-6 days weekly during her work day. She describes her heel pain as sharp that is constant. She feels like she's walking on rocks. She reports that her heels are swollen for most of the day. She wears extra insoles/orthothotics, she's been stretching/icing/massaging her feet without improvement. She's been taking Advil, Tylenol Arthritis without improvement. Her symptoms began several months ago and have progressed. She did experience these symptoms prior to starting her statin medication.   History of hyperlipidemia and is currently managed on rosuvastatin 10 mg. LDL of 126 in January 2019. History of chronic hypokalemia and is managed on potassium chloride 10 mEQ. She is compliant to both medications.   She also thinks she's losing more hair. She has been under more stress at work.   2) GERD: Symptoms include sharp mid sternal chest pain, belching, esopahgeal burning with reflux. Her symptoms have been present for the past several months. Currently managed on pantoprazole 20 mg. She had no improvement on omeprazole, Nexium was too costly. Her last endoscopy was in 2005 which showed GERD. She thinks she was once on pantoprazole 40 mg BID. She is trying to work on weight loss through her diet. She does eat fast food quite often.  Review of Systems  Gastrointestinal:       GERD  Musculoskeletal:       Mid lower back pain, bilateral lower extremity pain, bilateral plantar foot pain  Skin:       Hair loss  Neurological: Negative for dizziness, numbness and headaches.  Psychiatric/Behavioral:       Additional stress at work       Past Medical  History:  Diagnosis Date  . Anxiety   . Benzodiazepine overdose   . Depression   . Diabetes ([place])   . Fatty liver disease, nonalcoholic   . Heart murmur   . Heart palpitations   . High cholesterol   . 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 Hypertension   . Interstitial cystitis   . Irritable bowel syndrome with diarrhea   . 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 Migraine   . 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 Sleep apnea with hypersomnolence    CPAP study 11-28-09,  10-02-2009 AHI was 16.5, pressure to   . Tachycardia      Social History   Socioeconomic History  . Marital status: Single    Spouse name: Not on file  . Number of children: 1  . Years of education: College  . Highest education level: Not on file  Occupational History  . Occupation: Support Firefighter: [employer]  . Financial resource strain: Not on file  . Food insecurity:    Worry: Not on file    Inability: Not on file  . Transportation needs:    Medical: Not on file    Non-medical: Not on file  Tobacco Use  . Smoking status: Former Smoker    Packs/day: 0.25    Years: 2.00    Pack years: 0.50    Types: Cigarettes  Last attempt to quit: 02/14/1995    Years since quitting: 22.8  . Smokeless tobacco: Never Used  Substance and Sexual Activity  . Alcohol use: No    Frequency: Never  . Drug use: No  . Sexual activity: Never  Lifestyle  . Physical activity:    Days per week: Not on file    Minutes per session: Not on file  . Stress: Not on file  Relationships  . Social connections:    Talks on phone: Not on file    Gets together: Not on file    Attends religious service: Not on file    Active member of club or organization: Not on file    Attends meetings of clubs or organizations: Not on file    Relationship status: Not on file  . Intimate partner violence:    Fear of current or ex partner: Not on file    Emotionally abused: Not on  file    Physically abused: Not on file    Forced sexual activity: Not on file  Other Topics Concern  . Not on file  Social History Narrative   Single.   One daughter.   Works at [employer].   Enjoys relaxing, spending time with family.    Past Surgical History:  Procedure Laterality Date  . ADENOIDECTOMY    . BLADDER SURGERY    . CHOLECYSTECTOMY    . KNEE SURGERY     Left knee x 2   . LEEP  10/2016   CIN 1  . TONSILLECTOMY      Family History  Problem Relation Age of Onset  . Asthma Mother   . Hyperthyroidism Mother   . Rheum arthritis Mother   . Graves' disease Mother   . Sjogren's syndrome Mother   . Asthma Daughter   . Colon cancer Neg Hx     Allergies  Allergen Reactions  . Atorvastatin Other (See Comments)    Myalgias  . Codeine Hives and Nausea And Vomiting  . Morphine Hives  . Neurontin [Gabapentin] Other (See Comments)    syncope    Current Outpatient Medications on File Prior to Visit  Medication Sig Dispense Refill  . Cholecalciferol (VITAMIN D3) 2000 units TABS Take by mouth.    . Cyanocobalamin (B-12 PO) Take 1,000 mcg by mouth daily.    Marland Kitchen FLUoxetine (PROZAC) 20 MG capsule Take 60 mg by mouth daily.    Marland Kitchen glipiZIDE (GLUCOTROL) 5 MG tablet Take 1 tablet (5 mg total) by mouth 2 (two) times daily before a meal. 180 tablet 3  . KLOR-CON M10 10 MEQ tablet TAKE 1 TABLET BY MOUTH ONCE DAILY 90 tablet 0  . levonorgestrel (MIRENA) 20 MCG/24HR IUD 1 each by Intrauterine route once.    . metFORMIN (GLUCOPHAGE XR) 500 MG 24 hr tablet Take 1 tablet (500 mg total) by mouth daily with breakfast. 90 tablet 1  . metoprolol tartrate (LOPRESSOR) 25 MG tablet Take 1 tablet (25 mg total) by mouth 2 (two) times daily. 180 tablet 1  . QUEtiapine (SEROQUEL XR) 50 MG TB24 24 hr tablet Take 100 mg by mouth at bedtime.    Marland Kitchen venlafaxine (EFFEXOR) 75 MG tablet Take 150 mg by mouth daily.    . rosuvastatin (CRESTOR) 10 MG tablet Take 1 tablet by mouth every evening for high  cholesterol. (Patient not taking: Reported on 01/02/2018) 90 tablet 0   No current facility-administered medications on file prior to visit.     BP 118/70   Pulse  72   Temp 98.2 F (36.8 C) (Oral)   Ht 5' 5.25" (1.657 m)   Wt 172 lb 8 oz (78.2 kg)   SpO2 98%   BMI 28.49 kg/m    Objective:   Physical Exam  Constitutional: She appears well-nourished.  Neck: Neck supple.  Cardiovascular: Normal rate and regular rhythm.  Pulmonary/Chest: Effort normal and breath sounds normal.  Musculoskeletal:       Lumbar back: She exhibits pain. She exhibits normal range of motion, no tenderness and no bony tenderness.       Back:  Skin: Skin is warm and dry.  No obvious signs of significant hair loss  Psychiatric: She has a normal mood and affect.          Assessment & Plan:

## 2018-01-02 NOTE — Assessment & Plan Note (Signed)
Repeat A1C pending today ?

## 2018-01-03 ENCOUNTER — Encounter: Payer: Self-pay | Admitting: Primary Care

## 2018-01-03 DIAGNOSIS — K219 Gastro-esophageal reflux disease without esophagitis: Secondary | ICD-10-CM

## 2018-01-03 DIAGNOSIS — E782 Mixed hyperlipidemia: Secondary | ICD-10-CM

## 2018-01-04 ENCOUNTER — Ambulatory Visit: Payer: Self-pay | Admitting: Podiatry

## 2018-01-05 MED ORDER — ONDANSETRON 4 MG PO TBDP: 4.0000 mg | ORAL_TABLET | Freq: Three times a day (TID) | ORAL | 0 refills | Status: DC | PRN

## 2018-01-05 MED ORDER — ROSUVASTATIN CALCIUM 20 MG PO TABS: 20.0000 mg | ORAL_TABLET | Freq: Every evening | ORAL | 1 refills | Status: DC

## 2018-01-10 IMAGING — DX DG CHEST 1V
1 series · 1 of 1 positions shown · non-contrast
Comparison: No recent prior .

CLINICAL DATA: Altered mental status.

EXAM:
CHEST 1 VIEW

[chest ap]
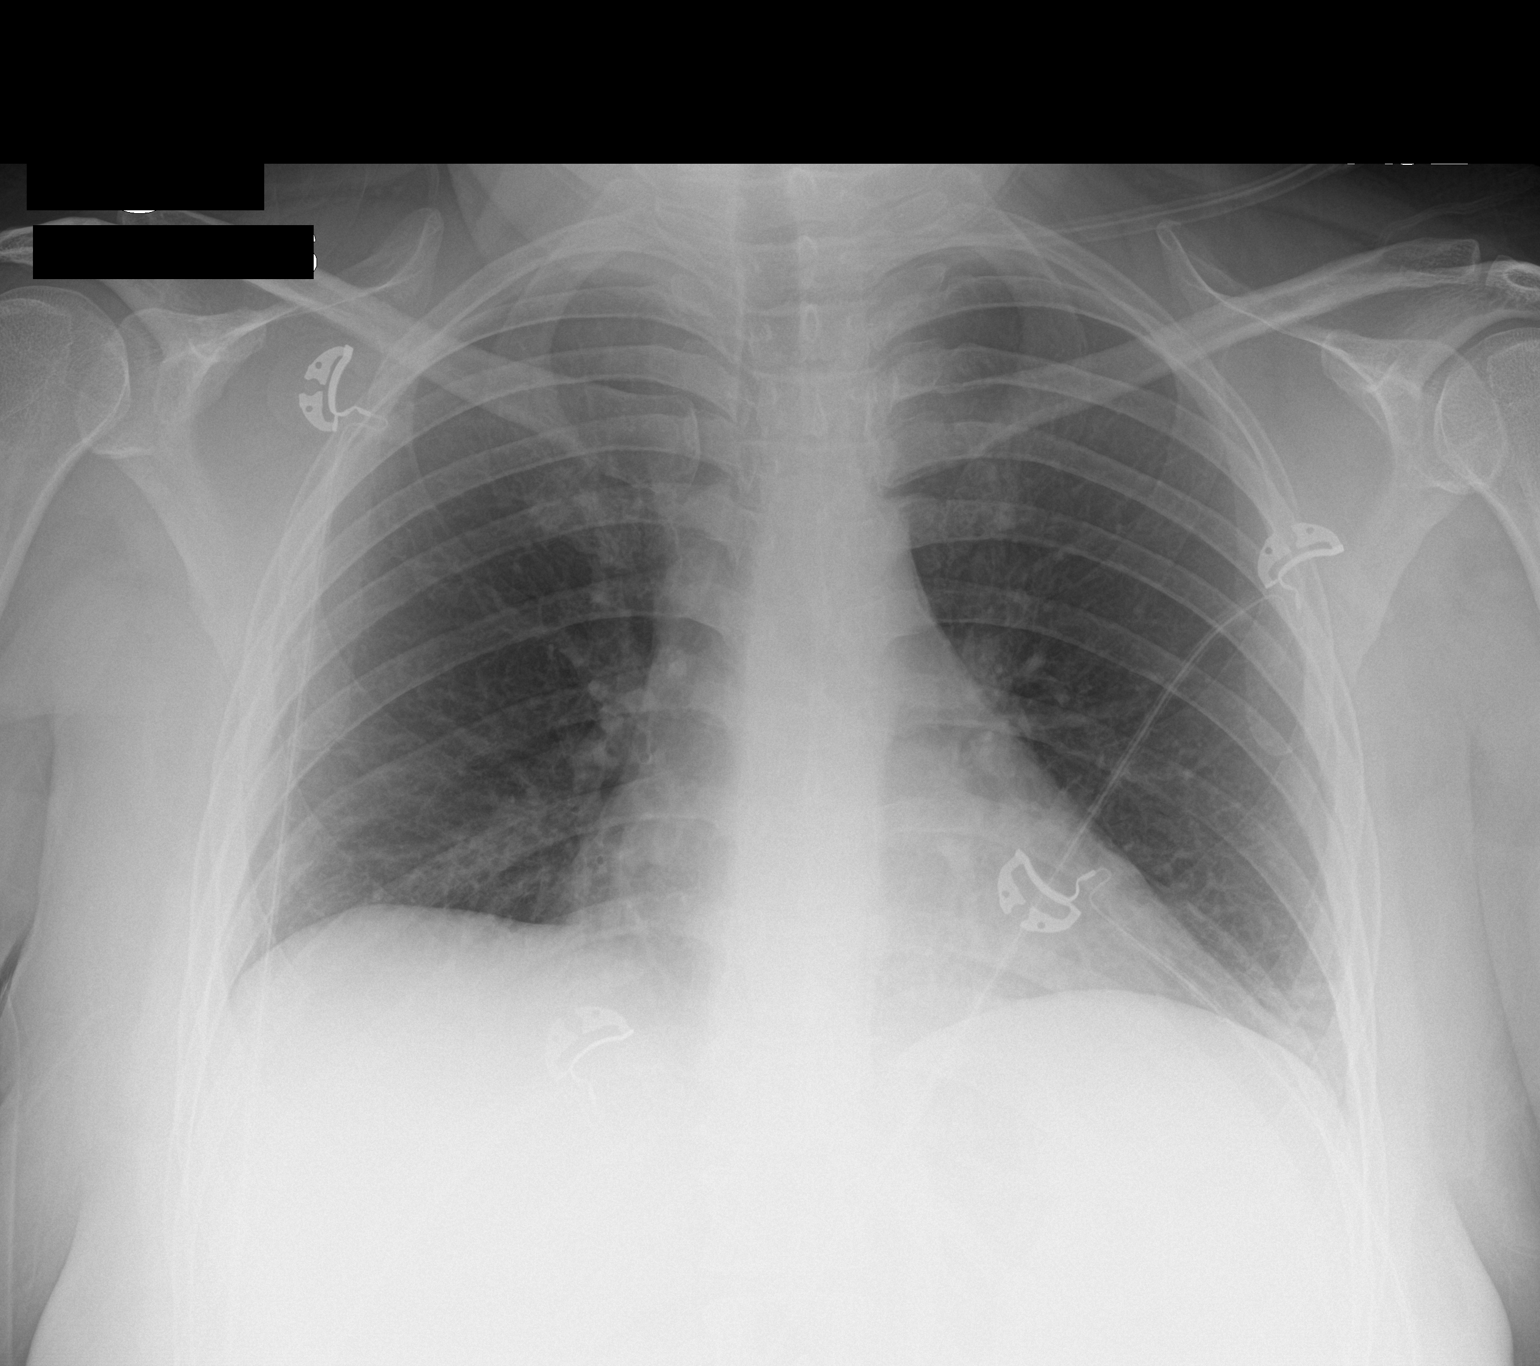

[1 of 1 positions shown; findings below may reference images not displayed]

FINDINGS: Mediastinum and hilar structures normal. Low lung volumes with mild
bibasilar atelectasis. Mild cardiomegaly with normal pulmonary
vascularity. No pleural effusion or pneumothorax .
IMPRESSION: Low lung volumes with mild bibasilar atelectasis.

## 2018-01-14 ENCOUNTER — Encounter: Payer: Self-pay | Admitting: Primary Care

## 2018-03-08 ENCOUNTER — Encounter: Payer: Self-pay | Admitting: Primary Care

## 2018-03-08 DIAGNOSIS — K219 Gastro-esophageal reflux disease without esophagitis: Secondary | ICD-10-CM

## 2018-03-11 ENCOUNTER — Other Ambulatory Visit: Payer: Self-pay | Admitting: Primary Care

## 2018-03-11 DIAGNOSIS — E876 Hypokalemia: Secondary | ICD-10-CM

## 2018-03-13 ENCOUNTER — Other Ambulatory Visit: Payer: Self-pay | Admitting: Primary Care

## 2018-03-13 ENCOUNTER — Telehealth: Payer: Self-pay

## 2018-03-13 DIAGNOSIS — E876 Hypokalemia: Secondary | ICD-10-CM

## 2018-03-13 DIAGNOSIS — K219 Gastro-esophageal reflux disease without esophagitis: Secondary | ICD-10-CM

## 2018-03-13 NOTE — Telephone Encounter (Signed)
Left message for patient to call Anyjah Roundtree back in regards to a referral-Addelynn Batte V Brok Stocking, RMA   

## 2018-03-14 ENCOUNTER — Encounter (INDEPENDENT_AMBULATORY_CARE_PROVIDER_SITE_OTHER): Payer: Self-pay

## 2018-03-14 MED ORDER — PANTOPRAZOLE SODIUM 40 MG PO TBEC: 40.0000 mg | DELAYED_RELEASE_TABLET | Freq: Every day | ORAL | 0 refills | Status: DC

## 2018-03-14 NOTE — Telephone Encounter (Signed)
Refill for pantoprazole sent to pharmacy. Will send my chart message inquiring about Zofran.

## 2018-03-14 NOTE — Telephone Encounter (Signed)
Ok to refill? Electronically refill request for   pantoprazole (PROTONIX) 40 MG tablet Last prescribed on 01/02/2018  ondansetron (ZOFRAN ODT) 4 MG disintegrating tablet Last prescribed on 01/05/2018  Last seen on 01/02/2018

## 2018-04-04 ENCOUNTER — Ambulatory Visit: Payer: 59 | Admitting: Primary Care

## 2018-04-07 ENCOUNTER — Encounter: Payer: Self-pay | Admitting: Emergency Medicine

## 2018-04-07 ENCOUNTER — Other Ambulatory Visit: Payer: Self-pay | Admitting: Primary Care

## 2018-04-07 ENCOUNTER — Other Ambulatory Visit: Payer: Self-pay

## 2018-04-07 ENCOUNTER — Ambulatory Visit: Payer: Self-pay | Admitting: *Deleted

## 2018-04-07 ENCOUNTER — Ambulatory Visit
Admission: EM | Admit: 2018-04-07 | Discharge: 2018-04-07 | Disposition: A | Discharge status: Home / Self Care | Payer: 59 | Attending: Family Medicine | Admitting: Family Medicine

## 2018-04-07 DIAGNOSIS — Z8349 Family history of other endocrine, nutritional and metabolic diseases: Secondary | ICD-10-CM | POA: Insufficient documentation

## 2018-04-07 DIAGNOSIS — Z825 Family history of asthma and other chronic lower respiratory diseases: Secondary | ICD-10-CM | POA: Diagnosis not present

## 2018-04-07 DIAGNOSIS — Z8261 Family history of arthritis: Secondary | ICD-10-CM | POA: Diagnosis not present

## 2018-04-07 DIAGNOSIS — R42 Dizziness and giddiness: Secondary | ICD-10-CM | POA: Diagnosis not present

## 2018-04-07 DIAGNOSIS — E876 Hypokalemia: Secondary | ICD-10-CM | POA: Diagnosis not present

## 2018-04-07 DIAGNOSIS — F329 Major depressive disorder, single episode, unspecified: Secondary | ICD-10-CM | POA: Diagnosis not present

## 2018-04-07 DIAGNOSIS — Z87891 Personal history of nicotine dependence: Secondary | ICD-10-CM | POA: Insufficient documentation

## 2018-04-07 DIAGNOSIS — R000 Tachycardia, unspecified: Secondary | ICD-10-CM

## 2018-04-07 DIAGNOSIS — Z885 Allergy status to narcotic agent status: Secondary | ICD-10-CM | POA: Diagnosis not present

## 2018-04-07 DIAGNOSIS — Z79899 Other long term (current) drug therapy: Secondary | ICD-10-CM | POA: Insufficient documentation

## 2018-04-07 DIAGNOSIS — Z7984 Long term (current) use of oral hypoglycemic drugs: Secondary | ICD-10-CM | POA: Insufficient documentation

## 2018-04-07 DIAGNOSIS — Z760 Encounter for issue of repeat prescription: Secondary | ICD-10-CM

## 2018-04-07 DIAGNOSIS — E119 Type 2 diabetes mellitus without complications: Secondary | ICD-10-CM | POA: Insufficient documentation

## 2018-04-07 DIAGNOSIS — K219 Gastro-esophageal reflux disease without esophagitis: Secondary | ICD-10-CM | POA: Diagnosis not present

## 2018-04-07 DIAGNOSIS — Z8269 Family history of other diseases of the musculoskeletal system and connective tissue: Secondary | ICD-10-CM | POA: Insufficient documentation

## 2018-04-07 DIAGNOSIS — F419 Anxiety disorder, unspecified: Secondary | ICD-10-CM | POA: Diagnosis not present

## 2018-04-07 DIAGNOSIS — I10 Essential (primary) hypertension: Secondary | ICD-10-CM | POA: Insufficient documentation

## 2018-04-07 DIAGNOSIS — E559 Vitamin D deficiency, unspecified: Secondary | ICD-10-CM | POA: Diagnosis not present

## 2018-04-07 DIAGNOSIS — G4733 Obstructive sleep apnea (adult) (pediatric): Secondary | ICD-10-CM | POA: Diagnosis not present

## 2018-04-07 DIAGNOSIS — Z888 Allergy status to other drugs, medicaments and biological substances status: Secondary | ICD-10-CM | POA: Insufficient documentation

## 2018-04-07 DIAGNOSIS — E538 Deficiency of other specified B group vitamins: Secondary | ICD-10-CM | POA: Diagnosis not present

## 2018-04-07 DIAGNOSIS — Z9049 Acquired absence of other specified parts of digestive tract: Secondary | ICD-10-CM | POA: Insufficient documentation

## 2018-04-07 DIAGNOSIS — R110 Nausea: Secondary | ICD-10-CM

## 2018-04-07 LAB — CBC WITH DIFFERENTIAL/PLATELET
Basophils Absolute: 0.1 10*3/uL (ref 0–0.1)
Basophils Relative: 1 %
Eosinophils Absolute: 0.2 10*3/uL (ref 0–0.7)
Eosinophils Relative: 3 %
HCT: 38.4 % (ref 35.0–47.0)
Hemoglobin: 12.4 g/dL (ref 12.0–16.0)
Lymphocytes Relative: 22 %
Lymphs Abs: 1.9 10*3/uL (ref 1.0–3.6)
MCH: 26.4 pg (ref 26.0–34.0)
MCHC: 32.4 g/dL (ref 32.0–36.0)
MCV: 81.6 fL (ref 80.0–100.0)
Monocytes Absolute: 0.5 10*3/uL (ref 0.2–0.9)
Monocytes Relative: 6 %
Neutro Abs: 5.8 10*3/uL (ref 1.4–6.5)
Neutrophils Relative %: 68 %
Platelets: 265 10*3/uL (ref 150–440)
RBC: 4.7 MIL/uL (ref 3.80–5.20)
RDW: 14.9 % — ABNORMAL HIGH (ref 11.5–14.5)
WBC: 8.5 10*3/uL (ref 3.6–11.0)

## 2018-04-07 LAB — BASIC METABOLIC PANEL
Anion gap: 13 (ref 5–15)
BUN: 9 mg/dL (ref 6–20)
CO2: 24 mmol/L (ref 22–32)
Calcium: 9.2 mg/dL (ref 8.9–10.3)
Chloride: 102 mmol/L (ref 98–111)
Creatinine, Ser: 0.8 mg/dL (ref 0.44–1.00)
GFR calc Af Amer: >60 mL/min (ref >60)
GFR calc non Af Amer: >60 mL/min (ref >60)
Glucose, Bld: 182 mg/dL — ABNORMAL HIGH (ref 70–99)
Potassium: 3.3 mmol/L — ABNORMAL LOW (ref 3.5–5.1)
Sodium: 139 mmol/L (ref 135–145)

## 2018-04-07 MED ORDER — METFORMIN HCL ER 500 MG PO TB24: 500.0000 mg | ORAL_TABLET | Freq: Every day | ORAL | 0 refills | Status: DC

## 2018-04-07 MED ORDER — POTASSIUM CHLORIDE CRYS ER 20 MEQ PO TBCR
20.0000 meq | EXTENDED_RELEASE_TABLET | Freq: Once | ORAL | Status: AC
  Administered 2018-04-07: 20 meq via ORAL

## 2018-04-07 MED ORDER — METOPROLOL TARTRATE 25 MG PO TABS: 25.0000 mg | ORAL_TABLET | Freq: Two times a day (BID) | ORAL | 0 refills | Status: DC

## 2018-04-07 NOTE — Discharge Instructions (Addendum)
Take medication as prescribed. Rest. Drink plenty of fluids.   Follow up with your primary care physician this week as needed. Return to Urgent care as needed.  As discussed for any worsening concerns, chest pain or shortness of breath, dizziness,proceed directly to the emergency room.

## 2018-04-07 NOTE — Telephone Encounter (Signed)
Pt reports sudden onset of dizziness this am. Lightheaded, no spinning, not positional. Lightheadedness is constant.  HR during call 140. States "Feels jittery inside, hard to describe."  Pt states she is at work where it is "Very hot." States she is probably not staying hydrated. States similar event "Years ago" related to blood pressure. Does not recall details, has not had B/P checked lately. Pt states she presently "Feels like I'm going to pass out."  Also reports nausea, no vomiting, and diaphoresis. Directed to ED, states co-worker will drive.  Reason for Disposition . SEVERE dizziness (e.g., unable to stand, requires support to walk, feels like passing out now)  Answer Assessment - Initial Assessment Questions 1. DESCRIPTION: "Describe your dizziness."     Lightheaded 2. LIGHTHEADED: "Do you feel lightheaded?" (e.g., somewhat faint, woozy, weak upon standing)    yes 3. VERTIGO: "Do you feel like either you or the room is spinning or tilting?" (i.e. vertigo)     no 4. SEVERITY: "How bad is it?"  "Do you feel like you are going to faint?" "Can you stand and walk?"   - MILD - walking normally   - MODERATE - interferes with normal activities (e.g., work, school)    - SEVERE - unable to stand, requires support to walk, feels like passing out now.      Moderate-severe 5. ONSET:  "When did the dizziness begin?"    This am 6. AGGRAVATING FACTORS: "Does anything make it worse?" (e.g., standing, change in head position)     no 7. HEART RATE: "Can you tell me your heart rate?" "How many beats in 15 seconds?"  (Note: not all patients can do this)       140 8. CAUSE: "What do you think is causing the dizziness?"     Unsure 9. RECURRENT SYMPTOM: "Have you had dizziness before?" If so, ask: "When was the last time?" "What happened that time?"     "Long time ago" Blood pressure 10. OTHER SYMPTOMS: "Do you have any other symptoms?" (e.g., fever, chest pain, vomiting, diarrhea, bleeding)  "Jittery inside" 11. PREGNANCY: "Is there any chance you are pregnant?" "When was your last menstrual period?"       no  Protocols used: DIZZINESS [facility]

## 2018-04-07 NOTE — ED Triage Notes (Signed)
Patient in today c/o light headedness, dizziness and nausea since this morning.

## 2018-04-07 NOTE — Telephone Encounter (Signed)
Per chart review tab pt is at [facility].

## 2018-04-07 NOTE — ED Provider Notes (Signed)
[facility] ____________________________________________  Time seen: Approximately 10:13 AM  I have reviewed the triage vital signs and the nursing notes.   HISTORY  Chief Complaint Dizziness  HPI Christine Cook is a 42 y.o. female past medical history of hypertension, tachycardia, diabetes, major depressive disorder presenting for evaluation of what she describes as a episode of jitteriness and lightheadedness while at work this morning.  Patient states that currently she does still have some symptoms, but reports does feel much better.  Patient states that the jitteriness was in side and all over, and slight lightheadedness but was not having room spinning episode.  States that she felt like she might pass out but never did.  No loss of vision, unilateral weakness, paresthesias, pain.  Did report some accompanying nausea which is since improved.  No vomiting or diarrhea.  Denies chest pain or shortness of breath.  Did state that she felt like her heart rate may have gone up some, but reports that this is a fairly common thing for her.  States that she has a history of tachycardia which she has been taking metoprolol.  States that she ran out of her metoprolol day before yesterday and has not had any in further does state that she believes she may have had a similar episode years ago when she ran out.  Did eat breakfast this morning and then went to work.  States that she felt fine first thing this morning.  As she was at work prep and food is one episode began.  Denies any other recent changes.  Denies recent increase or change in stress levels.  Denies chest pain, shortness of breath, abdominal pain, dysuria, extremity pain, extremity swelling, rash, fevers, recent sickness.  Denies unsteady gait, vision changes, unilateral weakness, paresthesias or other complaints. Does also reports she has not been drinking as much water she knows she needs to.  Denies other aggravating or  alleviating factors.  No alleviating measures attempted prior to arrival.  Denies cardiac family history.  Denies personal cardiac history other than tachycardia in which she reports metoprolol control as well.  No LMP recorded. (Menstrual status: IUD). Denies pregnancy Pleas Koch, NP: PCP    Past Medical History:  Diagnosis Date  . Anxiety   . Benzodiazepine overdose   . Depression   . Diabetes ([place])   . Fatty liver disease, nonalcoholic   . Heart murmur   . Heart palpitations   . High cholesterol   . 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 Hypertension   . Interstitial cystitis   . Irritable bowel syndrome with diarrhea   . 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 Migraine   . 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 Sleep apnea with hypersomnolence    CPAP study 11-28-09,  10-02-2009 AHI was 16.5, pressure to   . Tachycardia     Patient Active Problem List   Diagnosis Date Noted  . Plantar fasciitis 01/02/2018  . Low back pain 01/02/2018  . Hypokalemia 11/29/2017  . Hyperlipidemia 08/05/2016  . Tachycardia 08/05/2016  . OSA (obstructive sleep apnea) 07/12/2016  . 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 Vitamin B12 deficiency 07/12/2016  . Vitamin D deficiency 07/12/2016  . Diabetes mellitus without complication ([place]) 78/29/5621  . 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 Impulse control disorder (gambling) 07/11/2016  . Hypertension 07/09/2016  . GERD 01/21/2009    Past Surgical History:  Procedure Laterality Date  . ADENOIDECTOMY    .  BLADDER SURGERY    . CHOLECYSTECTOMY    . KNEE SURGERY     Left knee x 2   . LEEP  10/2016   CIN 1  . TONSILLECTOMY       No current facility-administered medications for this encounter.   Current Outpatient Medications:  .  atorvastatin (LIPITOR) 40 MG tablet, Take 40 mg by mouth every evening., Disp: , Rfl:  .  Cholecalciferol (VITAMIN D3) 2000 units TABS, Take by mouth., Disp: , Rfl:  .   Cyanocobalamin (B-12 PO), Take 1,000 mcg by mouth daily., Disp: , Rfl:  .  FLUoxetine (PROZAC) 20 MG capsule, Take 60 mg by mouth daily., Disp: , Rfl:  .  glipiZIDE (GLUCOTROL) 5 MG tablet, Take 1 tablet (5 mg total) by mouth 2 (two) times daily before a meal., Disp: 180 tablet, Rfl: 3 .  levonorgestrel (MIRENA) 20 MCG/24HR IUD, 1 each by Intrauterine route once., Disp: , Rfl:  .  metFORMIN (GLUCOPHAGE XR) 500 MG 24 hr tablet, Take 1 tablet (500 mg total) by mouth daily with breakfast., Disp: 90 tablet, Rfl: 1 .  metoprolol tartrate (LOPRESSOR) 25 MG tablet, Take 1 tablet (25 mg total) by mouth 2 (two) times daily., Disp: 180 tablet, Rfl: 1 .  pantoprazole (PROTONIX) 40 MG tablet, Take 1 tablet (40 mg total) by mouth daily., Disp: 90 tablet, Rfl: 0 .  potassium chloride (K-DUR,KLOR-CON) 10 MEQ tablet, TAKE 1 TABLET BY MOUTH ONCE DAILY, Disp: 90 tablet, Rfl: 0 .  QUEtiapine (SEROQUEL XR) 50 MG TB24 24 hr tablet, Take 100 mg by mouth at bedtime., Disp: , Rfl:  .  venlafaxine (EFFEXOR) 75 MG tablet, Take 150 mg by mouth daily., Disp: , Rfl:  .  metFORMIN (GLUCOPHAGE XR) 500 MG 24 hr tablet, Take 1 tablet (500 mg total) by mouth daily with breakfast., Disp: 7 tablet, Rfl: 0 .  metoprolol tartrate (LOPRESSOR) 25 MG tablet, Take 1 tablet (25 mg total) by mouth 2 (two) times daily for 7 days., Disp: 14 tablet, Rfl: 0 .  ondansetron (ZOFRAN ODT) 4 MG disintegrating tablet, Take 1 tablet (4 mg total) by mouth every 8 (eight) hours as needed for nausea or vomiting., Disp: 20 tablet, Rfl: 0  Allergies Codeine; Morphine; and Neurontin [gabapentin]  Family History  Problem Relation Age of Onset  . Asthma Mother   . Hyperthyroidism Mother   . Rheum arthritis Mother   . Graves' disease Mother   . Sjogren's syndrome Mother   . Asthma Daughter   . Colon cancer Neg Hx     Social History Social History   Tobacco Use  . Smoking status: Former Smoker    Packs/day: 0.25    Years: 2.00    Pack years:  0.50    Types: Cigarettes    Last attempt to quit: 2017    Years since quitting: 2.5  . Smokeless tobacco: Never Used  Substance Use Topics  . Alcohol use: No    Frequency: Never  . Drug use: No    Review of Systems Constitutional: No fever/chills Eyes: No visual changes. ENT: No sore throat. Cardiovascular: Denies chest pain. Respiratory: Denies shortness of breath. Gastrointestinal: No abdominal pain.   Genitourinary: Negative for dysuria. Musculoskeletal: Negative for back pain. Skin: Negative for rash. Neurological: Negative for headaches, focal weakness or numbness.   ____________________________________________   PHYSICAL EXAM:  VITAL SIGNS: ED Triage Vitals [04/07/18 0934]  Enc Vitals Group     BP 133/89     Pulse Rate Marland Kitchen)  109     Resp 16     Temp 98.5 F (36.9 C)     Temp Source Oral     SpO2 99 %     Weight 175 lb (79.4 kg)     Height 5\' 5"  (1.651 m)     Head Circumference      Peak Flow      Pain Score 3     Pain Loc      Pain Edu?      Excl. in [place]?    Vitals:   04/07/18 0934  BP: 133/89  Pulse: (!) 109  Resp: 16  Temp: 98.5 F (36.9 C)  TempSrc: Oral  SpO2: 99%  Weight: 175 lb (79.4 kg)  Height: 5\' 5"  (1.651 m)     Constitutional: Alert and oriented. Well appearing and in no acute distress. Eyes: Conjunctivae are normal.  ENT      Head: Normocephalic and atraumatic.      Nose: No congestion/rhinnorhea.      Mouth/Throat: Mucous membranes are moist.Oropharynx non-erythematous. Cardiovascular: Normal rate, regular rhythm. Grossly normal heart sounds.  Good peripheral circulation. Respiratory: Normal respiratory effort without tachypnea nor retractions. Breath sounds are clear and equal bilaterally. No wheezes, rales, rhonchi. Gastrointestinal: Soft and nontender.  No CVA tenderness. Musculoskeletal:  No midline cervical, thoracic or lumbar tenderness to palpation. Bilateral hand grip strong and equal.  Neurologic:  Normal speech and  language. No gross focal neurologic deficits are appreciated. Speech is normal. No gait instability. Negative pronator drift. Negative Romberg.  Skin:  Skin is warm, dry and intact. No rash noted. Psychiatric: Mood and affect are normal. Speech and behavior are normal. Patient exhibits appropriate insight and judgment   ___________________________________________   LABS (all labs ordered are listed, but only abnormal results are displayed)  Labs Reviewed  CBC WITH DIFFERENTIAL/PLATELET - Abnormal; Notable for the following components:      Result Value   RDW 14.9 (*)    All other components within normal limits  BASIC METABOLIC PANEL - Abnormal; Notable for the following components:   Potassium 3.3 (*)    Glucose, Bld 182 (*)    All other components within normal limits   ____________________________________________  EKG  ED ECG REPORT I, Marylene Land, the attending provider, personally viewed and interpreted this ECG.   Date: 04/07/2018  EKG Time: 1016  Rate: 93  Rhythm:  normal sinus rhythm  Axis: normal  Intervals:none  ST&T Change: none noted Similar to previous ecg 07/09/16  ____________________________________________  RADIOLOGY  No results found. ____________________________________________   PROCEDURES Procedures   INITIAL IMPRESSION / ASSESSMENT AND PLAN / ED COURSE  Pertinent labs & imaging results that were available during my care of the patient were reviewed by me and considered in my medical decision making (see chart for details).  Very well-appearing patient.  No acute distress.  Patient reports feeling better at this time than earlier.  Will evaluate EKG, CBC and BMP.  Suspects combination of being off of her metoprolol as well as not eating drinking as well.  EKG reviewed overall unremarkable and similar to previous EKGs.  On urgent care, patient reports feeling much better.  Labs reviewed in detail discussed with patient.  20  milliequivalents  potassium given once in urgent care.  Encourage rest, fluids, PCP follow-up.  Will refill metoprolol and metformin for 1 week as patient reports she does have a follow-up with her primary care next week.  Encourage supportive care.  Discussed for  any worsening concerns proceed directly to the emergency room for further evaluation.Discussed indication, risks and benefits of medications with patient.  Discussed follow up with Primary care physician this week. Discussed follow up and return parameters including no resolution or any worsening concerns. Patient verbalized understanding and agreed to plan.   ____________________________________________   FINAL CLINICAL IMPRESSION(S) / ED DIAGNOSES  Final diagnoses:  Lightheadedness  Hypokalemia  Hypertension, unspecified type  Type 2 diabetes mellitus without complication, unspecified whether long term insulin use ([place])  Medication refill     ED Discharge Orders        Ordered    metoprolol tartrate (LOPRESSOR) 25 MG tablet  2 times daily     04/07/18 1059    metFORMIN (GLUCOPHAGE XR) 500 MG 24 hr tablet  Daily with breakfast     04/07/18 1059       Note: This dictation was prepared with Dragon dictation along with smaller phrase technology. Any transcriptional errors that result from this process are unintentional.         Marylene Land, NP 04/07/18 1138

## 2018-04-08 NOTE — Telephone Encounter (Signed)
Urgent care appropriate

## 2018-04-10 ENCOUNTER — Encounter: Payer: Self-pay | Admitting: Primary Care

## 2018-04-10 DIAGNOSIS — I10 Essential (primary) hypertension: Secondary | ICD-10-CM

## 2018-04-10 DIAGNOSIS — E119 Type 2 diabetes mellitus without complications: Secondary | ICD-10-CM

## 2018-04-10 DIAGNOSIS — R000 Tachycardia, unspecified: Secondary | ICD-10-CM

## 2018-04-11 ENCOUNTER — Ambulatory Visit (INDEPENDENT_AMBULATORY_CARE_PROVIDER_SITE_OTHER): Payer: 59

## 2018-04-11 ENCOUNTER — Ambulatory Visit (INDEPENDENT_AMBULATORY_CARE_PROVIDER_SITE_OTHER): Payer: 59 | Admitting: Podiatry

## 2018-04-11 DIAGNOSIS — M722 Plantar fascial fibromatosis: Secondary | ICD-10-CM

## 2018-04-11 MED ORDER — METHYLPREDNISOLONE 4 MG PO TBPK: ORAL_TABLET | ORAL | 0 refills | Status: DC

## 2018-04-11 MED ORDER — METOPROLOL TARTRATE 25 MG PO TABS: 25.0000 mg | ORAL_TABLET | Freq: Two times a day (BID) | ORAL | 1 refills | Status: DC

## 2018-04-11 MED ORDER — METFORMIN HCL ER 500 MG PO TB24: 500.0000 mg | ORAL_TABLET | Freq: Every day | ORAL | 1 refills | Status: DC

## 2018-04-11 MED ORDER — DICLOFENAC SODIUM 75 MG PO TBEC: 75.0000 mg | DELAYED_RELEASE_TABLET | Freq: Two times a day (BID) | ORAL | 1 refills | Status: DC

## 2018-04-13 ENCOUNTER — Encounter: Payer: Self-pay | Admitting: Primary Care

## 2018-04-13 ENCOUNTER — Ambulatory Visit (INDEPENDENT_AMBULATORY_CARE_PROVIDER_SITE_OTHER): Payer: 59 | Admitting: Primary Care

## 2018-04-13 VITALS — BP 108/70 | HR 84 | Temp 98.2°F | Ht 65.25 in | Wt 170.5 lb

## 2018-04-13 DIAGNOSIS — E119 Type 2 diabetes mellitus without complications: Secondary | ICD-10-CM

## 2018-04-13 DIAGNOSIS — E785 Hyperlipidemia, unspecified: Secondary | ICD-10-CM

## 2018-04-13 DIAGNOSIS — I10 Essential (primary) hypertension: Secondary | ICD-10-CM | POA: Diagnosis not present

## 2018-04-13 DIAGNOSIS — Z23 Encounter for immunization: Secondary | ICD-10-CM

## 2018-04-13 DIAGNOSIS — E876 Hypokalemia: Secondary | ICD-10-CM

## 2018-04-13 LAB — LIPID PANEL
Cholesterol: 218 mg/dL — ABNORMAL HIGH (ref 0–200)
HDL: 40.8 mg/dL (ref >39.00)
NonHDL: 177.3
Total CHOL/HDL Ratio: 5
Triglycerides: 216 mg/dL — ABNORMAL HIGH (ref 0.0–149.0)
VLDL: 43.2 mg/dL — ABNORMAL HIGH (ref 0.0–40.0)

## 2018-04-13 LAB — LDL CHOLESTEROL, DIRECT: Direct LDL: 160 mg/dL

## 2018-04-13 LAB — MICROALBUMIN / CREATININE URINE RATIO
Creatinine,U: 179.2 mg/dL
Microalb Creat Ratio: 0.8 mg/g (ref 0.0–30.0)
Microalb, Ur: 1.3 mg/dL (ref 0.0–1.9)

## 2018-04-13 LAB — COMPREHENSIVE METABOLIC PANEL
ALT: 13 U/L (ref 0–35)
AST: 12 U/L (ref 0–37)
Albumin: 4.6 g/dL (ref 3.5–5.2)
Alkaline Phosphatase: 75 U/L (ref 39–117)
BUN: 12 mg/dL (ref 6–23)
CO2: 27 mEq/L (ref 19–32)
Calcium: 9.4 mg/dL (ref 8.4–10.5)
Chloride: 102 mEq/L (ref 96–112)
Creatinine, Ser: 0.88 mg/dL (ref 0.40–1.20)
GFR: 74.59 mL/min (ref >60.00)
Glucose, Bld: 127 mg/dL — ABNORMAL HIGH (ref 70–99)
Potassium: 3.4 mEq/L — ABNORMAL LOW (ref 3.5–5.1)
Sodium: 139 mEq/L (ref 135–145)
Total Bilirubin: 0.4 mg/dL (ref 0.2–1.2)
Total Protein: 7.4 g/dL (ref 6.0–8.3)

## 2018-04-13 LAB — HEMOGLOBIN A1C: Hgb A1c MFr Bld: 7.2 % — ABNORMAL HIGH (ref 4.6–6.5)

## 2018-04-13 NOTE — Patient Instructions (Addendum)
Stop by the lab prior to leaving today. I will notify you of your results once received.   Please schedule a physical/follow up with me in 6 months. You may also schedule a lab only appointment 3-4 days prior. We will discuss your lab results in detail during your physical.  It was a pleasure to see you today!   Diabetes Mellitus and Nutrition When you have diabetes (diabetes mellitus), it is very important to have healthy eating habits because your blood sugar (glucose) levels are greatly affected by what you eat and drink. Eating healthy foods in the appropriate amounts, at about the same times every day, can help you:  Control your blood glucose.  Lower your risk of heart disease.  Improve your blood pressure.  Reach or maintain a healthy weight.  Every person with diabetes is different, and each person has different needs for a meal plan. Your health care provider may recommend that you work with a diet and nutrition specialist (dietitian) to make a meal plan that is best for you. Your meal plan may vary depending on factors such as:  The calories you need.  The medicines you take.  Your weight.  Your blood glucose, blood pressure, and cholesterol levels.  Your activity level.  Other health conditions you have, such as heart or kidney disease.  How do carbohydrates affect me? Carbohydrates affect your blood glucose level more than any other type of food. Eating carbohydrates naturally increases the amount of glucose in your blood. Carbohydrate counting is a method for keeping track of how many carbohydrates you eat. Counting carbohydrates is important to keep your blood glucose at a healthy level, especially if you use insulin or take certain oral diabetes medicines. It is important to know how many carbohydrates you can safely have in each meal. This is different for every person. Your dietitian can help you calculate how many carbohydrates you should have at each meal and for  snack. Foods that contain carbohydrates include:  Bread, cereal, rice, pasta, and crackers.  Potatoes and corn.  Peas, beans, and lentils.  Milk and yogurt.  Fruit and juice.  Desserts, such as cakes, cookies, ice cream, and candy.  How does alcohol affect me? Alcohol can cause a sudden decrease in blood glucose (hypoglycemia), especially if you use insulin or take certain oral diabetes medicines. Hypoglycemia can be a life-threatening condition. Symptoms of hypoglycemia (sleepiness, dizziness, and confusion) are similar to symptoms of having too much alcohol. If your health care provider says that alcohol is safe for you, follow these guidelines:  Limit alcohol intake to no more than 1 drink per day for nonpregnant women and 2 drinks per day for men. One drink equals 12 oz of beer, 5 oz of wine, or 1 oz of hard liquor.  Do not drink on an empty stomach.  Keep yourself hydrated with water, diet soda, or unsweetened iced tea.  Keep in mind that regular soda, juice, and other mixers may contain a lot of sugar and must be counted as carbohydrates.  What are tips for following this plan? Reading food labels  Start by checking the serving size on the label. The amount of calories, carbohydrates, fats, and other nutrients listed on the label are based on one serving of the food. Many foods contain more than one serving per package.  Check the total grams (g) of carbohydrates in one serving. You can calculate the number of servings of carbohydrates in one serving by dividing the total carbohydrates  by 15. For example, if a food has 30 g of total carbohydrates, it would be equal to 2 servings of carbohydrates.  Check the number of grams (g) of saturated and trans fats in one serving. Choose foods that have low or no amount of these fats.  Check the number of milligrams (mg) of sodium in one serving. Most people should limit total sodium intake to less than 2,300 mg per day.  Always  check the nutrition information of foods labeled as "low-fat" or "nonfat". These foods may be higher in added sugar or refined carbohydrates and should be avoided.  Talk to your dietitian to identify your daily goals for nutrients listed on the label. Shopping  Avoid buying canned, premade, or processed foods. These foods tend to be high in fat, sodium, and added sugar.  Shop around the outside edge of the grocery store. This includes fresh fruits and vegetables, bulk grains, fresh meats, and fresh dairy. Cooking  Use low-heat cooking methods, such as baking, instead of high-heat cooking methods like deep frying.  Cook using healthy oils, such as olive, canola, or sunflower oil.  Avoid cooking with butter, cream, or high-fat meats. Meal planning  Eat meals and snacks regularly, preferably at the same times every day. Avoid going long periods of time without eating.  Eat foods high in fiber, such as fresh fruits, vegetables, beans, and whole grains. Talk to your dietitian about how many servings of carbohydrates you can eat at each meal.  Eat 4-6 ounces of lean protein each day, such as lean meat, chicken, fish, eggs, or tofu. 1 ounce is equal to 1 ounce of meat, chicken, or fish, 1 egg, or 1/4 cup of tofu.  Eat some foods each day that contain healthy fats, such as avocado, nuts, seeds, and fish. Lifestyle   Check your blood glucose regularly.  Exercise at least 30 minutes 5 or more days each week, or as told by your health care provider.  Take medicines as told by your health care provider.  Do not use any products that contain nicotine or tobacco, such as cigarettes and e-cigarettes. If you need help quitting, ask your health care provider.  Work with a Social worker or diabetes educator to identify strategies to manage stress and any emotional and social challenges. What are some questions to ask my health care provider?  Do I need to meet with a diabetes educator?  Do I need  to meet with a dietitian?  What number can I call if I have questions?  When are the best times to check my blood glucose? Where to find more information:  American Diabetes Association: diabetes.org/food-and-fitness/food  Academy of Nutrition and Dietetics: PokerClues.dk  [employer] of Diabetes and Digestive and Kidney Diseases (NIH): ContactWire.be Summary  A healthy meal plan will help you control your blood glucose and maintain a healthy lifestyle.  Working with a diet and nutrition specialist (dietitian) can help you make a meal plan that is best for you.  Keep in mind that carbohydrates and alcohol have immediate effects on your blood glucose levels. It is important to count carbohydrates and to use alcohol carefully. This information is not intended to replace advice given to you by your health care provider. Make sure you discuss any questions you have with your health care provider. Document Released: 06/17/2005 Document Revised: 10/25/2016 Document Reviewed: 10/25/2016 Elsevier Interactive Patient Education  [employer].

## 2018-04-13 NOTE — Assessment & Plan Note (Signed)
Repeat potassium pending. Continue potassium 10 mEq for now.

## 2018-04-13 NOTE — Progress Notes (Signed)
   Subjective: 42 year old female presenting today as a new patient with a chief complaint of pain in bilateral heels that began 6 months ago. She states the pain is worse in the morning with the first steps out of bed. She also states working on her feet for 8-10 hours increases the pain. She has not done anything for treatment. Patient presents today for further treatment and evaluation.  Past Medical History:  Diagnosis Date  . Anxiety   . Benzodiazepine overdose   . Depression   . Diabetes ([place])   . Fatty liver disease, nonalcoholic   . Heart murmur   . Heart palpitations   . High cholesterol   . 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 Hypertension   . Interstitial cystitis   . Irritable bowel syndrome with diarrhea   . 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 Migraine   . 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 Sleep apnea with hypersomnolence    CPAP study 11-28-09,  10-02-2009 AHI was 16.5, pressure to   . Tachycardia      Objective: Physical Exam General: The patient is alert and oriented x3 in no acute distress.  Dermatology: Skin is warm, dry and supple bilateral lower extremities. Negative for open lesions or macerations bilateral.   Vascular: Dorsalis Pedis and Posterior Tibial pulses palpable bilateral.  Capillary fill time is immediate to all digits.  Neurological: Epicritic and protective threshold intact bilateral.   Musculoskeletal: Tenderness to palpation to the plantar aspect of the bilateral heels along the plantar fascia. All other joints range of motion within normal limits bilateral. Strength 5/5 in all groups bilateral.   Radiographic exam: Normal osseous mineralization. Joint spaces preserved. No fracture/dislocation/boney destruction. No other soft tissue abnormalities or radiopaque foreign bodies.   Assessment: 1. plantar fasciitis bilateral feet  Plan of Care:  1. Patient evaluated. Xrays reviewed.   2. Injection of 0.5cc  Celestone soluspan injected into the bilateral heels.  3. Rx for Medrol Dose Pak placed 4. Rx for Diclofenac 75mg  PO BID ordered for patient. 5. Plantar fascial band(s) dispensed for bilateral plantar fasciitis. 6. Instructed patient regarding therapies and modalities at home to alleviate symptoms.  7. Recommended OTC insoles from [employer].  8. Return to clinic in 4 weeks.    Edrick Kins, DPM [facility]  Dr. Edrick Kins, DPM    [address], [place] [address]                Office [phone]  Fax [phone]

## 2018-04-13 NOTE — Assessment & Plan Note (Signed)
Repeat lipid panel pending. Continue atorvastatin.  

## 2018-04-13 NOTE — Progress Notes (Signed)
Subjective:    Patient ID: Christine Cook, female    DOB: [birth year], 42 y.o.   MRN: [id number]  HPI  Christine Cook is a 41 year old female who presents today for follow up.  1) Type 2 Diabetes:  Current medications include: Glipizide 5 mg BID, Metformin XR 500 mg.  She is checking her blood glucose 1-2 times weekly and is getting readings of: Before Dinner: 120's-140's  Highest reading: 300 2 months ago Lowest reading: 68 2 weeks ago  Last A1C: 7.4 in April 2019 Last Eye Exam: Overdue, completed several years ago Last Foot Exam: Due today Pneumonia Vaccination: Never completed  ACE/ARB: Urine Microalbumin pending Statin: Atorvastatin   2) Hyperlipidemia: Currently managed on atorvastatin 40 mg. LDL of 164 in April 2019.   3) Essential Hypertension: Currently managed on metoprolol tartrate 25 mg BID. Also managed on potassium 10 mEq daily. She presented to an urgent care several days ago, had labs taken which showed a low potassium level. She was instructed to take 20 mEq for one day and then continue 10 mEQ. She has been compliant to 10 mEq.  She denies chest pain, myalgias, dizziness, palpitations.  BP Readings from Last 3 Encounters:  04/13/18 108/70  04/07/18 133/89  01/02/18 118/70        Review of Systems  Respiratory: Negative for shortness of breath.   Cardiovascular: Negative for chest pain and palpitations.  Musculoskeletal: Negative for myalgias.  Neurological: Negative for dizziness.       Past Medical History:  Diagnosis Date  . Anxiety   . Benzodiazepine overdose   . Depression   . Diabetes ([place])   . Fatty liver disease, nonalcoholic   . Heart murmur   . Heart palpitations   . High cholesterol   . 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 Hypertension   . Interstitial cystitis   . Irritable bowel syndrome with diarrhea   . 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 Migraine   . 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 Sleep  apnea with hypersomnolence    CPAP study 11-28-09,  10-02-2009 AHI was 16.5, pressure to   . Tachycardia      Social History   Socioeconomic History  . Marital status: Single    Spouse name: Not on file  . Number of children: 1  . Years of education: College  . Highest education level: Not on file  Occupational History  . Occupation: Support Firefighter: [employer]  . Financial resource strain: Not on file  . Food insecurity:    Worry: Not on file    Inability: Not on file  . Transportation needs:    Medical: Not on file    Non-medical: Not on file  Tobacco Use  . Smoking status: Former Smoker    Packs/day: 0.25    Years: 2.00    Pack years: 0.50    Types: Cigarettes    Last attempt to quit: 2017    Years since quitting: 2.5  . Smokeless tobacco: Never Used  Substance and Sexual Activity  . Alcohol use: No    Frequency: Never  . Drug use: No  . Sexual activity: Never  Lifestyle  . Physical activity:    Days per week: Not on file    Minutes per session: Not on file  . Stress: Not on file  Relationships  . Social connections:    Talks on phone:  Not on file    Gets together: Not on file    Attends religious service: Not on file    Active member of club or organization: Not on file    Attends meetings of clubs or organizations: Not on file    Relationship status: Not on file  . Intimate partner violence:    Fear of current or ex partner: Not on file    Emotionally abused: Not on file    Physically abused: Not on file    Forced sexual activity: Not on file  Other Topics Concern  . Not on file  Social History Narrative   Single.   One daughter.   Works at [employer].   Enjoys relaxing, spending time with family.    Past Surgical History:  Procedure Laterality Date  . ADENOIDECTOMY    . BLADDER SURGERY    . CHOLECYSTECTOMY    . KNEE SURGERY     Left knee x 2   . LEEP  10/2016   CIN 1  . TONSILLECTOMY      Family History    Problem Relation Age of Onset  . Asthma Mother   . Hyperthyroidism Mother   . Rheum arthritis Mother   . Graves' disease Mother   . Sjogren's syndrome Mother   . Asthma Daughter   . Colon cancer Neg Hx     Allergies  Allergen Reactions  . Codeine Hives and Nausea And Vomiting  . Morphine Hives  . Neurontin [Gabapentin] Other (See Comments)    syncope    Current Outpatient Medications on File Prior to Visit  Medication Sig Dispense Refill  . atorvastatin (LIPITOR) 40 MG tablet Take 40 mg by mouth every evening.    . Cholecalciferol (VITAMIN D3) 2000 units TABS Take by mouth.    . Cyanocobalamin (B-12 PO) Take 1,000 mcg by mouth daily.    . diclofenac (VOLTAREN) 75 MG EC tablet Take 1 tablet (75 mg total) by mouth 2 (two) times daily. 60 tablet 1  . FLUoxetine (PROZAC) 20 MG capsule Take 60 mg by mouth daily.    Marland Kitchen glipiZIDE (GLUCOTROL) 5 MG tablet Take 1 tablet (5 mg total) by mouth 2 (two) times daily before a meal. 180 tablet 3  . levonorgestrel (MIRENA) 20 MCG/24HR IUD 1 each by Intrauterine route once.    . metFORMIN (GLUCOPHAGE XR) 500 MG 24 hr tablet Take 1 tablet (500 mg total) by mouth daily with breakfast. 90 tablet 1  . methylPREDNISolone (MEDROL DOSEPAK) 4 MG TBPK tablet 6 day dose pack - take as directed 21 tablet 0  . metoprolol tartrate (LOPRESSOR) 25 MG tablet Take 1 tablet (25 mg total) by mouth 2 (two) times daily. 180 tablet 1  . ondansetron (ZOFRAN ODT) 4 MG disintegrating tablet Take 1 tablet (4 mg total) by mouth every 8 (eight) hours as needed for nausea or vomiting. 20 tablet 0  . pantoprazole (PROTONIX) 40 MG tablet Take 1 tablet (40 mg total) by mouth daily. 90 tablet 0  . potassium chloride (K-DUR,KLOR-CON) 10 MEQ tablet TAKE 1 TABLET BY MOUTH ONCE DAILY 90 tablet 0  . QUEtiapine (SEROQUEL XR) 50 MG TB24 24 hr tablet Take 100 mg by mouth at bedtime.    Marland Kitchen venlafaxine (EFFEXOR) 75 MG tablet Take 150 mg by mouth daily.     No current facility-administered  medications on file prior to visit.     BP 108/70   Pulse 84   Temp 98.2 F (36.8 C) (Oral)  Ht 5' 5.25" (1.657 m)   Wt 170 lb 8 oz (77.3 kg)   SpO2 98%   BMI 28.16 kg/m    Objective:   Physical Exam  Constitutional: She appears well-nourished.  Neck: Neck supple.  Cardiovascular: Normal rate and regular rhythm.  Respiratory: Effort normal and breath sounds normal.  Skin: Skin is warm and dry.           Assessment & Plan:

## 2018-04-13 NOTE — Assessment & Plan Note (Signed)
Stable in the office today, continue metoprolol BID and potassium 10 mEq. BMP pending.

## 2018-04-13 NOTE — Assessment & Plan Note (Signed)
Repeat A1C and urine microalbumin pending. Referral placed to ophthalmology for diabetic eye exam. Foot exam today. Pneumonia vaccination today. Discussed the importance of a healthy diet and regular exercise in order for weight loss, and to reduce the risk of any potential medical problems.  Continue current regimen for now. Follow up in 6 months.

## 2018-04-14 ENCOUNTER — Other Ambulatory Visit: Payer: Self-pay | Admitting: Primary Care

## 2018-04-14 ENCOUNTER — Encounter: Payer: Self-pay | Admitting: Primary Care

## 2018-04-14 DIAGNOSIS — E785 Hyperlipidemia, unspecified: Secondary | ICD-10-CM

## 2018-04-14 DIAGNOSIS — E876 Hypokalemia: Secondary | ICD-10-CM

## 2018-04-14 MED ORDER — POTASSIUM CHLORIDE CRYS ER 20 MEQ PO TBCR
20.0000 meq | EXTENDED_RELEASE_TABLET | Freq: Every day | ORAL | 0 refills | Status: DC
Start: 2018-04-14 — End: 2018-08-08

## 2018-04-17 ENCOUNTER — Encounter: Payer: Self-pay | Admitting: Emergency Medicine

## 2018-04-17 ENCOUNTER — Other Ambulatory Visit: Payer: Self-pay

## 2018-04-17 ENCOUNTER — Ambulatory Visit
Admission: EM | Admit: 2018-04-17 | Discharge: 2018-04-17 | Disposition: A | Discharge status: Home / Self Care | Payer: 59 | Attending: Family Medicine | Admitting: Family Medicine

## 2018-04-17 DIAGNOSIS — B370 Candidal stomatitis: Secondary | ICD-10-CM | POA: Diagnosis not present

## 2018-04-17 DIAGNOSIS — J029 Acute pharyngitis, unspecified: Secondary | ICD-10-CM | POA: Diagnosis not present

## 2018-04-17 DIAGNOSIS — S39012A Strain of muscle, fascia and tendon of lower back, initial encounter: Secondary | ICD-10-CM

## 2018-04-17 LAB — RAPID STREP SCREEN (MED CTR MEBANE ONLY): Streptococcus, Group A Screen (Direct): NEGATIVE

## 2018-04-17 MED ORDER — CYCLOBENZAPRINE HCL 10 MG PO TABS: 10.0000 mg | ORAL_TABLET | Freq: Every day | ORAL | 0 refills | Status: DC

## 2018-04-17 MED ORDER — KETOROLAC TROMETHAMINE 10 MG PO TABS: 10.0000 mg | ORAL_TABLET | Freq: Three times a day (TID) | ORAL | 0 refills | Status: DC | PRN

## 2018-04-17 MED ORDER — NYSTATIN 100000 UNIT/ML MT SUSP: OROMUCOSAL | 0 refills | Status: DC

## 2018-04-17 MED ORDER — ATORVASTATIN CALCIUM 80 MG PO TABS: ORAL_TABLET | ORAL | 3 refills | Status: DC

## 2018-04-17 NOTE — ED Provider Notes (Signed)
[facility]    CSN: [id number] Arrival date & time: 04/17/18  1558     History   Chief Complaint Chief Complaint  Patient presents with  . Back Pain  . Sore Throat    HPI Christine Cook is a 42 y.o. female.   42 yo female with a c/o right lower back pain x 4 days after lifting a 3 gallon bucket. Denies any falls or direct trauma.   Also c/o sore throat and white spots on her tongue and inside her mouth. Denies any fevers, chills, difficulty breathing or swallowing.   The history is provided by the patient.    Past Medical History:  Diagnosis Date  . Anxiety   . Benzodiazepine overdose   . Depression   . Diabetes ([place])   . Fatty liver disease, nonalcoholic   . Heart murmur   . Heart palpitations   . High cholesterol   . 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 Hypertension   . Interstitial cystitis   . Irritable bowel syndrome with diarrhea   . 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 Migraine   . 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 Sleep apnea with hypersomnolence    CPAP study 11-28-09,  10-02-2009 AHI was 16.5, pressure to   . Tachycardia     Patient Active Problem List   Diagnosis Date Noted  . Plantar fasciitis 01/02/2018  . Low back pain 01/02/2018  . Hypokalemia 11/29/2017  . Hyperlipidemia 08/05/2016  . Tachycardia 08/05/2016  . OSA (obstructive sleep apnea) 07/12/2016  . 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 Vitamin B12 deficiency 07/12/2016  . Vitamin D deficiency 07/12/2016  . Diabetes mellitus without complication ([place]) 76/81/1572  . 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 Impulse control disorder (gambling) 07/11/2016  . Hypertension 07/09/2016  . GERD 01/21/2009    Past Surgical History:  Procedure Laterality Date  . ADENOIDECTOMY    . BLADDER SURGERY    . CHOLECYSTECTOMY    . KNEE SURGERY     Left knee x 2   . LEEP  10/2016   CIN 1  . TONSILLECTOMY      OB History   None      Home  Medications    Prior to Admission medications   Medication Sig Start Date End Date Taking? Authorizing Provider  atorvastatin (LIPITOR) 80 MG tablet Take 1 tablet by mouth every evening for cholesterol. 04/17/18  Yes Pleas Koch, NP  Cholecalciferol (VITAMIN D3) 2000 units TABS Take by mouth.   Yes [provider]  Cyanocobalamin (B-12 PO) Take 1,000 mcg by mouth daily.   Yes [provider]  FLUoxetine (PROZAC) 20 MG capsule Take 60 mg by mouth daily.   Yes Ahluwalia, Shamsher S, MD  glipiZIDE (GLUCOTROL) 5 MG tablet Take 1 tablet (5 mg total) by mouth 2 (two) times daily before a meal. 10/05/17  Yes Pleas Koch, NP  levonorgestrel (MIRENA) 20 MCG/24HR IUD 1 each by Intrauterine route once.   Yes [provider]  metFORMIN (GLUCOPHAGE XR) 500 MG 24 hr tablet Take 1 tablet (500 mg total) by mouth daily with breakfast. 04/11/18 04/11/19 Yes Pleas Koch, NP  methylPREDNISolone (MEDROL DOSEPAK) 4 MG TBPK tablet 6 day dose pack - take as directed 04/11/18  Yes Edrick Kins, DPM  metoprolol tartrate (LOPRESSOR) 25 MG tablet Take 1 tablet (25 mg total) by mouth 2 (two) times daily. 04/11/18  Yes Pleas Koch, NP  potassium chloride SA (K-DUR,KLOR-CON) 20 MEQ tablet Take 1 tablet (20 mEq total) by mouth daily. 04/14/18  Yes Pleas Koch, NP  QUEtiapine (SEROQUEL XR) 50 MG TB24 24 hr tablet Take 100 mg by mouth at bedtime.   Yes [provider]  venlafaxine (EFFEXOR) 75 MG tablet Take 150 mg by mouth daily.   Yes [provider]  cyclobenzaprine (FLEXERIL) 10 MG tablet Take 1 tablet (10 mg total) by mouth at bedtime. 04/17/18   Norval Gable, MD  diclofenac (VOLTAREN) 75 MG EC tablet Take 1 tablet (75 mg total) by mouth 2 (two) times daily. 04/11/18   Edrick Kins, DPM  ketorolac (TORADOL) 10 MG tablet Take 1 tablet (10 mg total) by mouth every 8 (eight) hours as needed. 04/17/18   Norval Gable, MD  nystatin (MYCOSTATIN) 100000 UNIT/ML  suspension 5 ml swish and spit q 6 hours 04/17/18   Norval Gable, MD  ondansetron (ZOFRAN ODT) 4 MG disintegrating tablet Take 1 tablet (4 mg total) by mouth every 8 (eight) hours as needed for nausea or vomiting. 01/05/18   Pleas Koch, NP  pantoprazole (PROTONIX) 40 MG tablet Take 1 tablet (40 mg total) by mouth daily. 03/14/18   Pleas Koch, NP    Family History Family History  Problem Relation Age of Onset  . Asthma Mother   . Hyperthyroidism Mother   . Rheum arthritis Mother   . Graves' disease Mother   . Sjogren's syndrome Mother   . Asthma Daughter   . Colon cancer Neg Hx     Social History Social History   Tobacco Use  . Smoking status: Former Smoker    Packs/day: 0.25    Years: 2.00    Pack years: 0.50    Types: Cigarettes    Last attempt to quit: 2017    Years since quitting: 2.5  . Smokeless tobacco: Never Used  Substance Use Topics  . Alcohol use: No    Frequency: Never  . Drug use: No     Allergies   Codeine; Morphine; and Neurontin [gabapentin]   Review of Systems Review of Systems   Physical Exam Triage Vital Signs ED Triage Vitals [04/17/18 1611]  Enc Vitals Group     BP 139/84     Pulse Rate 83     Resp 16     Temp 98.5 F (36.9 C)     Temp Source Oral     SpO2 99 %     Weight 170 lb (77.1 kg)     Height 5' 5.25" (1.657 m)     Head Circumference      Peak Flow      Pain Score 7     Pain Loc      Pain Edu?      Excl. in [place]?    No data found.  Updated Vital Signs BP 139/84 (BP Location: Left Arm)   Pulse 83   Temp 98.5 F (36.9 C) (Oral)   Resp 16   Ht 5' 5.25" (1.657 m)   Wt 170 lb (77.1 kg)   SpO2 99%   BMI 28.07 kg/m   Visual Acuity Right Eye Distance:   Left Eye Distance:   Bilateral Distance:    Right Eye Near:   Left Eye Near:    Bilateral Near:     Physical Exam  Constitutional: She appears well-developed and well-nourished. No distress.  HENT:  Mouth/Throat: Uvula is midline. Oral lesions  (white plaques on soft palate  and on tongue) present. Posterior oropharyngeal erythema present. No oropharyngeal exudate, posterior oropharyngeal edema or tonsillar abscesses. No tonsillar exudate.  Musculoskeletal: She exhibits tenderness. She exhibits no edema.       Lumbar back: She exhibits tenderness and spasm. She exhibits normal range of motion, no bony tenderness, no swelling, no edema, no deformity, no laceration, no pain and normal pulse.  Neurological: She is alert. She has normal reflexes. She displays normal reflexes. She exhibits normal muscle tone.  Skin: Skin is warm and dry. No rash noted. She is not diaphoretic. No erythema.  Nursing note and vitals reviewed.    UC Treatments / Results  Labs (all labs ordered are listed, but only abnormal results are displayed) Labs Reviewed  RAPID STREP SCREEN ([facility] ONLY)  CULTURE, GROUP A STREP [facility])    EKG None  Radiology No results found.  Procedures Procedures (including critical care time)  Medications Ordered in UC Medications - No data to display  Initial Impression / Assessment and Plan / UC Course  I have reviewed the triage vital signs and the nursing notes.  Pertinent labs & imaging results that were available during my care of the patient were reviewed by me and considered in my medical decision making (see chart for details).      Final Clinical Impressions(s) / UC Diagnoses   Final diagnoses:  Strain of lumbar region, initial encounter  [place]    ED Prescriptions    Medication Sig Dispense Auth. Provider   cyclobenzaprine (FLEXERIL) 10 MG tablet Take 1 tablet (10 mg total) by mouth at bedtime. 30 tablet Norval Gable, MD   ketorolac (TORADOL) 10 MG tablet Take 1 tablet (10 mg total) by mouth every 8 (eight) hours as needed. 15 tablet Delyle Weider, Linward Foster, MD   nystatin (MYCOSTATIN) 100000 UNIT/ML suspension 5 ml swish and spit q 6 hours 473 mL Norval Gable, MD     1. Lab results and diagnosis  reviewed with patient 2. rx as per orders above; reviewed possible side effects, interactions, risks and benefits  3. Recommend supportive treatment with rest, heat, stretching,  4. Follow-up prn if symptoms worsen or don't improve   Controlled Substance Prescriptions [place] Controlled Substance Registry consulted? Not Applicable   Norval Gable, MD 04/17/18 [phone]

## 2018-04-17 NOTE — ED Triage Notes (Signed)
Patient in today c/o right sided back pain x 4 days. Patient denies urinary frequency or dysuria. Patient does state that she picked up a 3.5 gallon bucket of pickles and pulled her back. Patient has been using ice packs, patches, muscle relaxer, Ibuprofen and Tylenol without relief.  Patient c/o sore throat since last night. Patient denies fever. Patient thinks she has thrush.

## 2018-04-20 LAB — CULTURE, GROUP A STREP (THRC)

## 2018-05-04 ENCOUNTER — Other Ambulatory Visit (INDEPENDENT_AMBULATORY_CARE_PROVIDER_SITE_OTHER): Payer: 59

## 2018-05-04 ENCOUNTER — Encounter: Payer: Self-pay | Admitting: Primary Care

## 2018-05-04 DIAGNOSIS — E876 Hypokalemia: Secondary | ICD-10-CM

## 2018-05-05 LAB — POTASSIUM: Potassium: 4.5 mEq/L (ref 3.5–5.1)

## 2018-05-09 ENCOUNTER — Ambulatory Visit: Payer: 59 | Admitting: Podiatry

## 2018-05-16 ENCOUNTER — Ambulatory Visit: Payer: 59 | Admitting: Podiatry

## 2018-05-17 ENCOUNTER — Encounter: Payer: Self-pay | Admitting: Primary Care

## 2018-05-23 ENCOUNTER — Encounter: Payer: Self-pay | Admitting: Podiatry

## 2018-05-23 ENCOUNTER — Ambulatory Visit (INDEPENDENT_AMBULATORY_CARE_PROVIDER_SITE_OTHER): Payer: 59 | Admitting: Podiatry

## 2018-05-23 DIAGNOSIS — M722 Plantar fascial fibromatosis: Secondary | ICD-10-CM

## 2018-05-23 MED ORDER — IBUPROFEN 800 MG PO TABS: 800.0000 mg | ORAL_TABLET | Freq: Three times a day (TID) | ORAL | 1 refills | Status: DC | PRN

## 2018-05-24 ENCOUNTER — Telehealth: Payer: Self-pay | Admitting: Podiatry

## 2018-05-24 NOTE — Telephone Encounter (Signed)
lvm for pt to call to discuss orthotic coverage.  Pt returned call and is aware of coverage but is not able to proceed at this time but will call when ready. I told pt to call and make an appt with Liliane Channel in the [place] office for a Wednesday when she is ready to proceed.

## 2018-05-25 NOTE — Progress Notes (Signed)
   Subjective: 42 year old female presenting today for follow up evaluation of plantar fasciitis of bilateral feet. She states her pain has improved somewhat. She reports some continued pain in the morning when she first gets out of bed. She has been wearing the fascial braces and taking Diclofenac with some relief. Patient presents today for further treatment and evaluation.  Past Medical History:  Diagnosis Date  . Anxiety   . Benzodiazepine overdose   . Depression   . Diabetes ([place])   . Fatty liver disease, nonalcoholic   . Heart murmur   . Heart palpitations   . High cholesterol   . 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 Hypertension   . Interstitial cystitis   . Irritable bowel syndrome with diarrhea   . 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 Migraine   . 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 Sleep apnea with hypersomnolence    CPAP study 11-28-09,  10-02-2009 AHI was 16.5, pressure to   . Tachycardia      Objective: Physical Exam General: The patient is alert and oriented x3 in no acute distress.  Dermatology: Skin is warm, dry and supple bilateral lower extremities. Negative for open lesions or macerations bilateral.   Vascular: Dorsalis Pedis and Posterior Tibial pulses palpable bilateral.  Capillary fill time is immediate to all digits.  Neurological: Epicritic and protective threshold intact bilateral.   Musculoskeletal: Tenderness to palpation to the plantar aspect of the bilateral heels along the plantar fascia. All other joints range of motion within normal limits bilateral. Strength 5/5 in all groups bilateral.   Assessment: 1. plantar fasciitis bilateral feet  Plan of Care:  1. Patient evaluated.    2. Injection of 0.5cc Celestone soluspan injected into the bilateral heels.  3. Continue using plantar fascial braces.  4. Prescription for Motrin 800 mg provided to patient. Discontinue taking Diclofenac 75 mg.  5. Preauthorization  request for custom molded orthotics sent to [facility].  6. Return to clinic in 4 weeks.   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   Edrick Kins, DPM [facility]  Dr. Edrick Kins, DPM    [address], [place] [address]                Office [phone]  Fax [phone]

## 2018-06-14 LAB — HM DIABETES EYE EXAM

## 2018-06-21 ENCOUNTER — Encounter: Payer: Self-pay | Admitting: Primary Care

## 2018-06-23 ENCOUNTER — Ambulatory Visit: Payer: 59 | Admitting: Podiatry

## 2018-06-30 ENCOUNTER — Ambulatory Visit: Payer: 59 | Admitting: Primary Care

## 2018-06-30 ENCOUNTER — Ambulatory Visit: Payer: Self-pay | Admitting: *Deleted

## 2018-06-30 DIAGNOSIS — Z0289 Encounter for other administrative examinations: Secondary | ICD-10-CM

## 2018-06-30 NOTE — Telephone Encounter (Signed)
Pt calling stating that she has been experiencing vomiting and diarrhea since 12 pm today. Pt states she is not able to keep anything down including liquid and has expereienced 7-8 episodes of vomiting and diarrhea today. Pt states she works at a [employer] and ate something last night but did not eat anything today. Advised pt to seek treatment in the ED. Pt states she can not wait for hours in the ED. Explained to pt that with her current symptoms the best treatment would be to seek care in the ED. Pt verbalized understanding but does not state if she is going to go the the ED.  Reason for Disposition . [1] SEVERE vomiting (e.g., 6 or more times/day) AND [2] present > 8 hours  Answer Assessment - Initial Assessment Questions 1. VOMITING SEVERITY: "How many times have you vomited in the past 24 hours?"     - MILD:  1 - 2 times/day    - MODERATE: 3 - 5 times/day, decreased oral intake without significant weight loss or symptoms of dehydration    - SEVERE: 6 or more times/day, vomits everything or nearly everything, with significant weight loss, symptoms of dehydration      7-8 times 2. ONSET: "When did the vomiting begin?"      12pm today 3. FLUIDS: "What fluids or food have you vomited up today?" "Have you been able to keep any fluids down?"     Last episode about 30 min ago, just liquid 4. ABDOMINAL PAIN: "Are your having any abdominal pain?" If yes : "How bad is it and what does it feel like?" (e.g., crampy, dull, intermittent, constant)      Yes, rates pain at 5 comes and goes describes pain as sharp/crampy 5. DIARRHEA: "Is there any diarrhea?" If so, ask: "How many times today?"      Yes, 7-8 times today 6. CONTACTS: "Is there anyone else in the family with the same symptoms?"      NO 7. CAUSE: "What do you think is causing your vomiting?"     unknown 8. HYDRATION STATUS: "Any signs of dehydration?" (e.g., dry mouth [not only dry lips], too weak to stand) "When did you last  urinate?"     Dry mouth 9. OTHER SYMPTOMS: "Do you have any other symptoms?" (e.g., fever, headache, vertigo, vomiting blood or coffee grounds, recent head injury)     Headache and feels achy 10. PREGNANCY: "Is there any chance you are pregnant?" "When was your last menstrual period?"       No, last menstrual cycle-has a IUD, spotted about 2 weeks ago  Protocols used: [facility]

## 2018-07-02 ENCOUNTER — Other Ambulatory Visit: Payer: Self-pay

## 2018-07-02 ENCOUNTER — Ambulatory Visit
Admission: EM | Admit: 2018-07-02 | Discharge: 2018-07-02 | Disposition: A | Discharge status: Home / Self Care | Payer: 59 | Attending: Emergency Medicine | Admitting: Emergency Medicine

## 2018-07-02 ENCOUNTER — Encounter: Payer: Self-pay | Admitting: Gynecology

## 2018-07-02 DIAGNOSIS — J101 Influenza due to other identified influenza virus with other respiratory manifestations: Secondary | ICD-10-CM

## 2018-07-02 LAB — RAPID INFLUENZA A&B ANTIGENS
Influenza A (ARMC): POSITIVE — AB
Influenza B (ARMC): NEGATIVE

## 2018-07-02 MED ORDER — IBUPROFEN 600 MG PO TABS: 600.0000 mg | ORAL_TABLET | Freq: Four times a day (QID) | ORAL | 0 refills | Status: DC | PRN

## 2018-07-02 MED ORDER — ONDANSETRON 8 MG PO TBDP: ORAL_TABLET | ORAL | 0 refills | Status: DC

## 2018-07-02 MED ORDER — ONDANSETRON 8 MG PO TBDP
8.0000 mg | ORAL_TABLET | Freq: Once | ORAL | Status: AC
  Administered 2018-07-02: 8 mg via ORAL

## 2018-07-02 MED ORDER — OSELTAMIVIR PHOSPHATE 75 MG PO CAPS: 75.0000 mg | ORAL_CAPSULE | Freq: Two times a day (BID) | ORAL | 0 refills | Status: DC

## 2018-07-02 NOTE — Discharge Instructions (Addendum)
Take the Zofran as needed for nausea.  I will also make you constipated so it should stop any diarrhea that you may still be having.  Push electrolyte containing fluids, such as Pedialyte or Gatorade.  Drink these until your urine becomes clear.  Ibuprofen 600 mg to take with 1 g of Tylenol together 3 or 4 times a day as needed for pain, fever, body aches.  Finish the Tamiflu, even if you feel better.  Go to the ER for the signs and symptoms we discussed.

## 2018-07-02 NOTE — ED Provider Notes (Signed)
HPI  SUBJECTIVE:  Christine Cook is a 42 y.o. female who presents with 3 days of nausea, vomiting, diarrhea.  States the vomiting stopped yesterday and the diarrhea is slowing down.  She reports frontal sinus headaches, fevers 100.8, chills, body aches, nasal congestion, rhinorrhea, postnasal drip.  No sore throat, cough.  She reports diffuse crampy abdominal pain before vomiting and diarrhea, and it then it resolves.  She is tolerating p.o. today.  States that she has been drinking electrolyte containing fluids.  She reports slightly decreased urine output, states that she has urinated 2-3 times today which is a normal frequency for her.  No sick contacts although she works in the [employer] as a Freight forwarder.  No antipyretic in the past 6 to 8 hours.  She states that she ate a salad before symptoms started, but denies any other raw or undercooked foods, questionable leftovers.  She tried Tylenol with improvement in her symptoms.  No aggravating factors.  Past medical history of hypertension, diabetes, hypercholesterolemia, GERD, obstructive sleep apnea.  She is a former smoker.  No history of DKA, asthma, eczema, COPD.  LMP: 2 weeks ago, she has an IUD. ZGY:FVCBS, Leticia Penna, NP   Past Medical History:  Diagnosis Date  . Anxiety   . Benzodiazepine overdose   . Depression   . Diabetes ([place])   . Fatty liver disease, nonalcoholic   . Heart murmur   . Heart palpitations   . High cholesterol   . 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 Hypertension   . Interstitial cystitis   . Irritable bowel syndrome with diarrhea   . 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 Migraine   . 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 Sleep apnea with hypersomnolence    CPAP study 11-28-09,  10-02-2009 AHI was 16.5, pressure to   . Tachycardia     Past Surgical History:  Procedure Laterality Date  . ADENOIDECTOMY    . BLADDER SURGERY    . CHOLECYSTECTOMY    . KNEE SURGERY     Left  knee x 2   . LEEP  10/2016   CIN 1  . TONSILLECTOMY      Family History  Problem Relation Age of Onset  . Asthma Mother   . Hyperthyroidism Mother   . Rheum arthritis Mother   . Graves' disease Mother   . Sjogren's syndrome Mother   . Asthma Daughter   . Colon cancer Neg Hx     Social History   Tobacco Use  . Smoking status: Former Smoker    Packs/day: 0.25    Years: 2.00    Pack years: 0.50    Types: Cigarettes    Last attempt to quit: 2017    Years since quitting: 2.7  . Smokeless tobacco: Never Used  Substance Use Topics  . Alcohol use: No    Frequency: Never  . Drug use: No    No current facility-administered medications for this encounter.   Current Outpatient Medications:  .  atorvastatin (LIPITOR) 80 MG tablet, Take 1 tablet by mouth every evening for cholesterol., Disp: 90 tablet, Rfl: 3 .  Cholecalciferol (VITAMIN D3) 2000 units TABS, Take by mouth., Disp: , Rfl:  .  Cyanocobalamin (B-12 PO), Take 1,000 mcg by mouth daily., Disp: , Rfl:  .  FLUoxetine (PROZAC) 20 MG capsule, Take 60 mg by mouth daily., Disp: , Rfl:  .  glipiZIDE (GLUCOTROL) 5 MG tablet, Take 1 tablet (5  mg total) by mouth 2 (two) times daily before a meal., Disp: 180 tablet, Rfl: 3 .  levonorgestrel (MIRENA) 20 MCG/24HR IUD, 1 each by Intrauterine route once., Disp: , Rfl:  .  metFORMIN (GLUCOPHAGE XR) 500 MG 24 hr tablet, Take 1 tablet (500 mg total) by mouth daily with breakfast., Disp: 90 tablet, Rfl: 1 .  metoprolol tartrate (LOPRESSOR) 25 MG tablet, Take 1 tablet (25 mg total) by mouth 2 (two) times daily., Disp: 180 tablet, Rfl: 1 .  pantoprazole (PROTONIX) 40 MG tablet, Take 1 tablet (40 mg total) by mouth daily., Disp: 90 tablet, Rfl: 0 .  potassium chloride SA (K-DUR,KLOR-CON) 20 MEQ tablet, Take 1 tablet (20 mEq total) by mouth daily., Disp: 90 tablet, Rfl: 0 .  QUEtiapine (SEROQUEL XR) 50 MG TB24 24 hr tablet, Take 100 mg by mouth at bedtime., Disp: , Rfl:  .  venlafaxine (EFFEXOR)  75 MG tablet, Take 150 mg by mouth daily., Disp: , Rfl:  .  diclofenac (VOLTAREN) 75 MG EC tablet, Take 1 tablet (75 mg total) by mouth 2 (two) times daily., Disp: 60 tablet, Rfl: 1 .  ibuprofen (ADVIL,MOTRIN) 600 MG tablet, Take 1 tablet (600 mg total) by mouth every 6 (six) hours as needed., Disp: 30 tablet, Rfl: 0 .  ondansetron (ZOFRAN ODT) 8 MG disintegrating tablet, 1/2- 1 tablet q 8 hr prn nausea, vomiting, Disp: 20 tablet, Rfl: 0 .  oseltamivir (TAMIFLU) 75 MG capsule, Take 1 capsule (75 mg total) by mouth 2 (two) times daily. X 5 days, Disp: 10 capsule, Rfl: 0  Allergies  Allergen Reactions  . Codeine Hives and Nausea And Vomiting  . Morphine Hives  . Neurontin [Gabapentin] Other (See Comments)    syncope     ROS  As noted in HPI.   Physical Exam  BP 130/84 (BP Location: Left Arm)   Pulse (!) 122   Temp 99.2 F (37.3 C) (Oral)   Resp 16   Ht 5\' 5"  (1.651 m)   Wt 77.1 kg   SpO2 99%   BMI 28.29 kg/m   Constitutional: Well developed, well nourished, no acute distress.  Appears ill. Eyes: PERRL, EOMI, conjunctiva normal bilaterally HENT: Normocephalic, atraumatic,mucus membranes moist.  Mild nasal congestion.  No frontal sinus tenderness.  Minimal maxillary sinus tenderness.  Normal oropharynx. Neck: No cervical lymphadenopathy, meningismus Respiratory: Clear to auscultation bilaterally, no rales, no wheezing, no rhonchi Cardiovascular: Regular tachycardia, no murmurs, no gallops, no rubs GI: Soft, nondistended, normal bowel sounds, nontender, no rebound, no guarding.  Negative Murphy, negative McBurney. Back: no CVAT skin: No rash, skin intact Musculoskeletal: No edema, no tenderness, no deformities Neurologic: Alert & oriented x 3, CN II-XII grossly intact, no motor deficits, sensation grossly intact Psychiatric: Speech and behavior appropriate   ED Course   Medications  ondansetron (ZOFRAN-ODT) disintegrating tablet 8 mg (8 mg Oral Given 07/02/18 1222)     Orders Placed This Encounter  Procedures  . Rapid Influenza A&B Antigens ([facility] only)    Standing Status:   Standing    Number of Occurrences:   1  . Droplet precaution    Standing Status:   Standing    Number of Occurrences:   1   Results for orders placed or performed during the hospital encounter of 07/02/18 (from the past 24 hour(s))  Rapid Influenza A&B Antigens ([place] only)     Status: Abnormal   Collection Time: 07/02/18 12:18 PM  Result Value Ref Range   Influenza A [facility]) POSITIVE (  A) NEGATIVE   Influenza B ([facility]) NEGATIVE NEGATIVE   No results found.  ED Clinical Impression  Influenza A   ED Assessment/Plan  Patient flu A positive.  She was given Zofran here and was tolerating p.o. prior to discharge.  Feel that she is stable to attempt oral rehydration rather than needing IV fluids.  States that she has not vomited since yesterday and that the diarrhea is slowing down.  Has been drinking electrolyte containing fluids.  She states that she has urinated 2 or 3 times today.  Will send home with Tamiflu, 8 mg of Zofran, ibuprofen 600 mg to take with 1 g of Tylenol.  Advised patient to push electrolyte containing fluids such as Pedialyte, Gatorade, will write a work note for 2 days.  Discussed labs,, MDM, treatment plan, and plan for follow-up with patient Discussed sn/sx that should prompt return to the ED. patient agrees with plan.   Meds ordered this encounter  Medications  . ondansetron (ZOFRAN-ODT) disintegrating tablet 8 mg  . oseltamivir (TAMIFLU) 75 MG capsule    Sig: Take 1 capsule (75 mg total) by mouth 2 (two) times daily. X 5 days    Dispense:  10 capsule    Refill:  0  . ondansetron (ZOFRAN ODT) 8 MG disintegrating tablet    Sig: 1/2- 1 tablet q 8 hr prn nausea, vomiting    Dispense:  20 tablet    Refill:  0  . ibuprofen (ADVIL,MOTRIN) 600 MG tablet    Sig: Take 1 tablet (600 mg total) by mouth every 6 (six) hours as needed.    Dispense:  30 tablet     Refill:  0    *This clinic note was created using [employer]. Therefore, there may be occasional mistakes despite careful proofreading.  ?   Melynda Ripple, MD 07/02/18 [phone]

## 2018-07-02 NOTE — ED Triage Notes (Signed)
Patient c/o stomach virus x 3 days. Per patient with vomiting and diarrhea. Per patient work in [employer]. Per patient with nausea and diarrhea x today. Per patient also with body ace and fever of 101 at home.

## 2018-07-03 NOTE — Telephone Encounter (Signed)
Per chart review tab pt was seen [facility] on 07/02/18.

## 2018-07-03 NOTE — Telephone Encounter (Signed)
Noted, patient tested positive for influenza A and treated. Chart reviewed.

## 2018-07-04 ENCOUNTER — Telehealth: Payer: Self-pay | Admitting: Emergency Medicine

## 2018-07-04 NOTE — Telephone Encounter (Signed)
Patient called stating that she was diagnosed with the Flu on Sunday and is still not feeling well enough to go to work today.  Patient was given a 2 day work note at her visit on Sunday.  Spoke to Dr. Lacinda Axon and is giving her another 2 day work note for her to return to work on October 3. Patient notified that she can pick her work note up at the front desk.  Patient to follow-up here or with PCP if her symptoms persist or worsen.  Patient verbalized understanding.

## 2018-07-04 NOTE — Telephone Encounter (Signed)
Christine Cook, will you write her out for 10/1 and 10/2? Thanks!

## 2018-07-05 ENCOUNTER — Other Ambulatory Visit: Payer: Self-pay | Admitting: Primary Care

## 2018-07-05 DIAGNOSIS — K219 Gastro-esophageal reflux disease without esophagitis: Secondary | ICD-10-CM

## 2018-07-11 ENCOUNTER — Encounter: Payer: Self-pay | Admitting: Podiatry

## 2018-07-11 ENCOUNTER — Ambulatory Visit (INDEPENDENT_AMBULATORY_CARE_PROVIDER_SITE_OTHER): Payer: 59 | Admitting: Podiatry

## 2018-07-11 DIAGNOSIS — M722 Plantar fascial fibromatosis: Secondary | ICD-10-CM

## 2018-07-11 MED ORDER — DICLOFENAC SODIUM 75 MG PO TBEC: 75.0000 mg | DELAYED_RELEASE_TABLET | Freq: Two times a day (BID) | ORAL | 1 refills | Status: DC

## 2018-07-11 MED ORDER — FLUCONAZOLE 150 MG PO TABS: 150.0000 mg | ORAL_TABLET | Freq: Once | ORAL | 0 refills | Status: AC

## 2018-07-11 MED ORDER — MAGIC MOUTHWASH: 5.0000 mL | Freq: Three times a day (TID) | ORAL | 0 refills | Status: DC

## 2018-07-12 NOTE — Progress Notes (Signed)
   Subjective: 42 year old female presenting today for follow up evaluation of plantar fasciitis of bilateral feet. She reports some continued soreness but states it has improved from when her symptoms first began. She has been taking Diclofenac as directed. There are no modifying factors noted. Patient is here for further evaluation and treatment.   Past Medical History:  Diagnosis Date  . Anxiety   . Benzodiazepine overdose   . Depression   . Diabetes ([place])   . Fatty liver disease, nonalcoholic   . Heart murmur   . Heart palpitations   . High cholesterol   . 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 Hypertension   . Interstitial cystitis   . Irritable bowel syndrome with diarrhea   . 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 Migraine   . 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 Sleep apnea with hypersomnolence    CPAP study 11-28-09,  10-02-2009 AHI was 16.5, pressure to   . Tachycardia      Objective: Physical Exam General: The patient is alert and oriented x3 in no acute distress.  Dermatology: Skin is warm, dry and supple bilateral lower extremities. Negative for open lesions or macerations bilateral.   Vascular: Dorsalis Pedis and Posterior Tibial pulses palpable bilateral.  Capillary fill time is immediate to all digits.  Neurological: Epicritic and protective threshold intact bilateral.   Musculoskeletal: Tenderness to palpation to the plantar aspect of the bilateral heels along the plantar fascia. All other joints range of motion within normal limits bilateral. Strength 5/5 in all groups bilateral.   Assessment: 1. plantar fasciitis bilateral feet - improved   Plan of Care:  1. Patient evaluated.    2. Injection of 0.5cc Celestone soluspan injected into the bilateral heels.  3. Refill prescription for Diclofenac provided to patient.  4. Custom molded orthotics are $80 out of pocket. Patient is saving for it.  5. Patient gets oral thrush every  time she gets injections. Prescription for Diflucan and Magic Mouthwash provided to patient.  6. Return to clinic as needed.   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   Edrick Kins, DPM [facility]  Dr. Edrick Kins, DPM    [address], [place] [address]                Office [phone]  Fax [phone]

## 2018-08-03 ENCOUNTER — Ambulatory Visit: Payer: 59

## 2018-08-08 ENCOUNTER — Other Ambulatory Visit: Payer: Self-pay | Admitting: Primary Care

## 2018-08-08 ENCOUNTER — Telehealth: Payer: Self-pay | Admitting: Podiatry

## 2018-08-08 DIAGNOSIS — E876 Hypokalemia: Secondary | ICD-10-CM

## 2018-08-08 NOTE — Telephone Encounter (Signed)
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 Pt just dropped off prescription for magic mouthwash solution. It was only written for 15 ML's and the compound medication, so we just need to clarify to if its the [facility] magic mouth wash or another formulation? Please call us back as pt is here in the store. [phone].

## 2018-08-09 ENCOUNTER — Telehealth: Payer: Self-pay | Admitting: *Deleted

## 2018-08-09 NOTE — Telephone Encounter (Signed)
Christine Cook on [place] asked if the [employer] was [facility]'s and for 141m. I told [name]'s Magic Mouthwash would be good 112ml.

## 2018-08-09 NOTE — Telephone Encounter (Signed)
Left message at pharmacy and verified ok to use [facility] magic mouthwash

## 2018-08-17 ENCOUNTER — Ambulatory Visit (INDEPENDENT_AMBULATORY_CARE_PROVIDER_SITE_OTHER): Payer: 59

## 2018-08-17 DIAGNOSIS — Z23 Encounter for immunization: Secondary | ICD-10-CM

## 2018-08-17 NOTE — Progress Notes (Signed)
Patient in office today for annual flu vaccine. Tolerated administration of vaccine well in right deltoid. VIS given as directed.  

## 2018-09-02 DIAGNOSIS — K219 Gastro-esophageal reflux disease without esophagitis: Secondary | ICD-10-CM

## 2018-09-04 MED ORDER — PANTOPRAZOLE SODIUM 40 MG PO TBEC: 40.0000 mg | DELAYED_RELEASE_TABLET | Freq: Every day | ORAL | 0 refills | Status: DC

## 2018-09-07 ENCOUNTER — Ambulatory Visit: Payer: 59 | Admitting: Primary Care

## 2018-09-12 ENCOUNTER — Ambulatory Visit (INDEPENDENT_AMBULATORY_CARE_PROVIDER_SITE_OTHER): Payer: 59 | Admitting: Family Medicine

## 2018-09-12 ENCOUNTER — Encounter: Payer: Self-pay | Admitting: Family Medicine

## 2018-09-12 VITALS — BP 118/64 | HR 82 | Temp 98.5°F | Ht 65.25 in | Wt 172.2 lb

## 2018-09-12 DIAGNOSIS — J069 Acute upper respiratory infection, unspecified: Secondary | ICD-10-CM | POA: Diagnosis not present

## 2018-09-12 DIAGNOSIS — R6889 Other general symptoms and signs: Secondary | ICD-10-CM | POA: Diagnosis not present

## 2018-09-12 LAB — POCT INFLUENZA A/B
Influenza A, POC: NEGATIVE
Influenza B, POC: NEGATIVE

## 2018-09-12 NOTE — Progress Notes (Signed)
Subjective:     Christine Cook is a 42 y.o. female presenting for Cough (non-productive, chest congesiton, symptoms started lat night)     Cough  This is a new problem. The current episode started yesterday. The problem has been gradually worsening. The cough is non-productive. Associated symptoms include ear congestion, headaches, myalgias, nasal congestion, postnasal drip and rhinorrhea. Pertinent negatives include no chest pain, chills, ear pain or fever. Nothing aggravates the symptoms. Treatments tried: tylenol, ibuprofen, cold&flu medication, antihistamine, mucinex. The treatment provided no relief. There is no history of asthma, COPD or environmental allergies.   Works at [employer] - so a lot of sick contact Did get the flu shot and already had the flu once this year  Feels similar but not as bad   Review of Systems  Constitutional: Negative for chills and fever.  HENT: Positive for postnasal drip and rhinorrhea. Negative for ear pain.   Respiratory: Positive for cough and chest tightness.   Cardiovascular: Negative for chest pain.  Musculoskeletal: Positive for myalgias and neck pain.  Allergic/Immunologic: Negative for environmental allergies.  Neurological: Positive for headaches.     Social History   Tobacco Use  Smoking Status Former Smoker  . Packs/day: 0.25  . Years: 2.00  . Pack years: 0.50  . Types: Cigarettes  . Last attempt to quit: 2017  . Years since quitting: 2.9  Smokeless Tobacco Never Used        Objective:    BP Readings from Last 3 Encounters:  09/12/18 118/64  07/02/18 130/84  04/17/18 139/84   Wt Readings from Last 3 Encounters:  09/12/18 172 lb 4 oz (78.1 kg)  07/02/18 170 lb (77.1 kg)  04/17/18 170 lb (77.1 kg)    BP 118/64   Pulse 82   Temp 98.5 F (36.9 C) (Oral)   Ht 5' 5.25" (1.657 m)   Wt 172 lb 4 oz (78.1 kg)   SpO2 96%   BMI 28.44 kg/m    Physical Exam  Constitutional: She appears well-developed and  well-nourished. No distress.  HENT:  Head: Normocephalic and atraumatic.  Right Ear: Tympanic membrane and ear canal normal.  Left Ear: Tympanic membrane and ear canal normal.  Nose: Mucosal edema and rhinorrhea present. Right sinus exhibits no maxillary sinus tenderness and no frontal sinus tenderness. Left sinus exhibits no maxillary sinus tenderness and no frontal sinus tenderness.  Mouth/Throat: Uvula is midline and mucous membranes are normal. Posterior oropharyngeal erythema present. No oropharyngeal exudate. Tonsils are 0 on the right. Tonsils are 0 on the left.  Eyes: Conjunctivae and EOM are normal. No scleral icterus.  Neck: Normal range of motion. Neck supple. Muscular tenderness present. No spinous process tenderness present. No neck rigidity. Normal range of motion present.  Cardiovascular: Normal rate, regular rhythm and normal heart sounds.  No murmur heard. Pulmonary/Chest: Effort normal and breath sounds normal. No respiratory distress.  Lymphadenopathy:    She has no cervical adenopathy.  Neurological: She is alert.  Skin: Skin is warm and dry. Capillary refill takes less than 2 seconds. She is not diaphoretic.  Psychiatric: She has a normal mood and affect.     Rapid Flu: negative     Assessment & Plan:   Problem List Items Addressed This Visit    None    Visit Diagnoses    Upper respiratory tract infection, unspecified type    -  Primary   Flu-like symptoms       Relevant Orders   POCT  Influenza A/B (Completed)     Flu negative Viral etiology Symptomatic care - work note for today  Return if symptoms worsen or fail to improve.  Lesleigh Noe, MD

## 2018-09-12 NOTE — Patient Instructions (Addendum)
Based on your symptoms, it looks like you have a virus.   Antibiotics are not need for a viral infection but the following will help:   1. Drink plenty of fluids 2. Get lots of rest  Sinus Congestion 1) Neti Pot (Saline rinse) -- 2 times day -- if tolerated 2) Flonase (Store Brand ok) - once daily 3) Over the counter congestion medications - as you are doing  Cough 1) Cough drops can be helpful 2) Nyquil (or nighttime cough medication) 3) Honey is proven to be one of the best cough medications    If you develop fevers (Temperature >100.4), chills, worsening symptoms or symptoms lasting longer than 10 days return to clinic.

## 2018-09-18 ENCOUNTER — Other Ambulatory Visit: Payer: Self-pay

## 2018-09-18 ENCOUNTER — Ambulatory Visit
Admission: EM | Admit: 2018-09-18 | Discharge: 2018-09-18 | Disposition: A | Discharge status: Home / Self Care | Payer: 59 | Attending: Family Medicine | Admitting: Family Medicine

## 2018-09-18 ENCOUNTER — Encounter: Payer: Self-pay | Admitting: Emergency Medicine

## 2018-09-18 DIAGNOSIS — N760 Acute vaginitis: Secondary | ICD-10-CM | POA: Diagnosis not present

## 2018-09-18 DIAGNOSIS — B3731 Acute candidiasis of vulva and vagina: Secondary | ICD-10-CM

## 2018-09-18 DIAGNOSIS — Z87891 Personal history of nicotine dependence: Secondary | ICD-10-CM

## 2018-09-18 DIAGNOSIS — B9689 Other specified bacterial agents as the cause of diseases classified elsewhere: Secondary | ICD-10-CM

## 2018-09-18 DIAGNOSIS — B373 Candidiasis of vulva and vagina: Secondary | ICD-10-CM | POA: Diagnosis not present

## 2018-09-18 DIAGNOSIS — J40 Bronchitis, not specified as acute or chronic: Secondary | ICD-10-CM | POA: Diagnosis not present

## 2018-09-18 LAB — WET PREP, GENITAL
Sperm: NONE SEEN
Trich, Wet Prep: NONE SEEN

## 2018-09-18 MED ORDER — METRONIDAZOLE 500 MG PO TABS: 500.0000 mg | ORAL_TABLET | Freq: Two times a day (BID) | ORAL | 0 refills | Status: DC

## 2018-09-18 MED ORDER — DOXYCYCLINE HYCLATE 100 MG PO CAPS: 100.0000 mg | ORAL_CAPSULE | Freq: Two times a day (BID) | ORAL | 0 refills | Status: DC

## 2018-09-18 MED ORDER — FLUCONAZOLE 150 MG PO TABS: 150.0000 mg | ORAL_TABLET | Freq: Every day | ORAL | 0 refills | Status: DC

## 2018-09-18 NOTE — ED Provider Notes (Signed)
[facility] ____________________________________________  Time seen: Approximately 1:49 PM  I have reviewed the triage vital signs and the nursing notes.   HISTORY  Chief Complaint Cough and Vaginal Itching   HPI Christine Cook is a 42 y.o. female presenting for evaluation of just over a week of cough, chest congestion and some nasal congestion.  Also accompanying intermittent sore throat.  States that she was seen by her primary care office last week and diagnosed with viral illness, states influenza was negative.  Did have positive fevers last week at beginning of sickness onset, but also has continued with intermittent hot and chilled sensation.  Has continued taking intermittent over-the-counter Tylenol, ibuprofen and some over-the-counter cough and congestion medication.  States cough is often dry, occasionally productive.  Unresolved with over-the-counter medication.  Denies accompanying chest pain or shortness of breath.  Continues to drink fluids well, decrease in appetite.  Also reports few days of vaginal itching, vaginal irritation and some discharge.  States she does intermittently get yeast infections and concern for the same.  Denies concerns of STDs.  Denies current pregnancy.  Denies known trigger.  Reports otherwise doing well.  Pleas Koch, NP: PCP    Past Medical History:  Diagnosis Date  . Anxiety   . Benzodiazepine overdose   . Depression   . Diabetes ([place])   . Fatty liver disease, nonalcoholic   . Heart murmur   . Heart palpitations   . High cholesterol   . 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 Hypertension   . Interstitial cystitis   . Irritable bowel syndrome with diarrhea   . 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 Migraine   . 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 Sleep apnea with hypersomnolence    CPAP study 11-28-09,  10-02-2009 AHI was 16.5, pressure to   . Tachycardia     Patient Active Problem List     Diagnosis Date Noted  . Plantar fasciitis 01/02/2018  . Low back pain 01/02/2018  . Hypokalemia 11/29/2017  . Hyperlipidemia 08/05/2016  . Tachycardia 08/05/2016  . OSA (obstructive sleep apnea) 07/12/2016  . 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 Vitamin B12 deficiency 07/12/2016  . Vitamin D deficiency 07/12/2016  . Diabetes mellitus without complication ([place]) 83/41/9622  . 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 Impulse control disorder (gambling) 07/11/2016  . Hypertension 07/09/2016  . GERD 01/21/2009    Past Surgical History:  Procedure Laterality Date  . ADENOIDECTOMY    . BLADDER SURGERY    . CHOLECYSTECTOMY    . KNEE SURGERY     Left knee x 2   . LEEP  10/2016   CIN 1  . TONSILLECTOMY       No current facility-administered medications for this encounter.   Current Outpatient Medications:  .  atorvastatin (LIPITOR) 80 MG tablet, Take 1 tablet by mouth every evening for cholesterol., Disp: 90 tablet, Rfl: 3 .  Cholecalciferol (VITAMIN D3) 2000 units TABS, Take by mouth., Disp: , Rfl:  .  Cyanocobalamin (B-12 PO), Take 1,000 mcg by mouth daily., Disp: , Rfl:  .  FLUoxetine (PROZAC) 20 MG capsule, Take 60 mg by mouth daily., Disp: , Rfl:  .  glipiZIDE (GLUCOTROL) 5 MG tablet, Take 1 tablet (5 mg total) by mouth 2 (two) times daily before a meal., Disp: 180 tablet, Rfl: 3 .  levonorgestrel (MIRENA) 20 MCG/24HR IUD, 1 each by Intrauterine route once., Disp: , Rfl:  .  metFORMIN (GLUCOPHAGE  XR) 500 MG 24 hr tablet, Take 1 tablet (500 mg total) by mouth daily with breakfast., Disp: 90 tablet, Rfl: 1 .  metoprolol tartrate (LOPRESSOR) 25 MG tablet, Take 1 tablet (25 mg total) by mouth 2 (two) times daily., Disp: 180 tablet, Rfl: 1 .  pantoprazole (PROTONIX) 40 MG tablet, Take 1 tablet (40 mg total) by mouth daily., Disp: 90 tablet, Rfl: 0 .  potassium chloride SA (K-DUR,KLOR-CON) 20 MEQ tablet, TAKE 1 TABLET BY MOUTH ONCE DAILY, Disp: 90 tablet, Rfl: 0 .  QUEtiapine (SEROQUEL  XR) 50 MG TB24 24 hr tablet, Take 100 mg by mouth at bedtime., Disp: , Rfl:  .  venlafaxine (EFFEXOR) 75 MG tablet, Take 150 mg by mouth daily., Disp: , Rfl:  .  diclofenac (VOLTAREN) 75 MG EC tablet, Take 1 tablet (75 mg total) by mouth 2 (two) times daily., Disp: 60 tablet, Rfl: 1 .  doxycycline (VIBRAMYCIN) 100 MG capsule, Take 1 capsule (100 mg total) by mouth 2 (two) times daily., Disp: 20 capsule, Rfl: 0 .  fluconazole (DIFLUCAN) 150 MG tablet, Take 1 tablet (150 mg total) by mouth daily. Take one pill orally, then Repeat in one week, Disp: 2 tablet, Rfl: 0 .  ibuprofen (ADVIL,MOTRIN) 600 MG tablet, Take 1 tablet (600 mg total) by mouth every 6 (six) hours as needed., Disp: 30 tablet, Rfl: 0 .  metroNIDAZOLE (FLAGYL) 500 MG tablet, Take 1 tablet (500 mg total) by mouth 2 (two) times daily., Disp: 14 tablet, Rfl: 0  Allergies Codeine; Morphine; and Neurontin [gabapentin]  Family History  Problem Relation Age of Onset  . Asthma Mother   . Hyperthyroidism Mother   . Rheum arthritis Mother   . Graves' disease Mother   . Sjogren's syndrome Mother   . Asthma Daughter   . Colon cancer Neg Hx     Social History Social History   Tobacco Use  . Smoking status: Former Smoker    Packs/day: 0.25    Years: 2.00    Pack years: 0.50    Types: Cigarettes    Last attempt to quit: 2017    Years since quitting: 2.9  . Smokeless tobacco: Never Used  Substance Use Topics  . Alcohol use: No    Frequency: Never  . Drug use: No    Review of Systems Constitutional: Positive fevers. ENT:As above Cardiovascular: Denies chest pain. Respiratory: Denies shortness of breath. Gastrointestinal: No abdominal pain.   Genitourinary: Negative for dysuria. Skin: Negative for rash.   ____________________________________________   PHYSICAL EXAM:  VITAL SIGNS: ED Triage Vitals  Enc Vitals Group     BP 09/18/18 1259 132/87     Pulse Rate 09/18/18 1259 87     Resp 09/18/18 1259 16     Temp  09/18/18 1259 98.2 F (36.8 C)     Temp Source 09/18/18 1259 Oral     SpO2 09/18/18 1259 98 %     Weight 09/18/18 1256 172 lb (78 kg)     Height 09/18/18 1256 5\' 5"  (1.651 m)     Head Circumference --      Peak Flow --      Pain Score 09/18/18 1256 3     Pain Loc --      Pain Edu? --      Excl. in [place]? --     Constitutional: Alert and oriented. Well appearing and in no acute distress. Eyes: Conjunctivae are normal.  Head: Atraumatic. No sinus tenderness to palpation. No swelling. No erythema.  Ears: no erythema, normal TMs bilaterally.   Nose:Nasal congestion   Mouth/Throat: Mucous membranes are moist. No pharyngeal erythema. No tonsillar swelling or exudate.  Neck: No stridor.  No cervical spine tenderness to palpation. Hematological/Lymphatic/Immunilogical: No cervical lymphadenopathy. Cardiovascular: Normal rate, regular rhythm. Grossly normal heart sounds.  Good peripheral circulation. Respiratory: Normal respiratory effort.  No retractions. No wheezes.  Mild scattered rhonchi.  Dry intermittent cough in room.  Speaks in complete sentences.  Good air movement.  Musculoskeletal: Ambulatory with steady gait.  Neurologic:  Normal speech and language. No gait instability. Skin:  Skin appears warm, dry and intact. No rash noted. Psychiatric: Mood and affect are normal. Speech and behavior are normal. ___________________________________________   LABS (all labs ordered are listed, but only abnormal results are displayed)  Labs Reviewed  WET PREP, GENITAL - Abnormal; Notable for the following components:      Result Value   Yeast Wet Prep HPF POC PRESENT (*)    Clue Cells Wet Prep HPF POC PRESENT (*)    WBC, Wet Prep HPF POC MODERATE (*)    All other components within normal limits   ____________________________________________   PROCEDURES Procedures   INITIAL IMPRESSION / ASSESSMENT AND PLAN / ED COURSE  Pertinent labs & imaging results that were available during my  care of the patient were reviewed by me and considered in my medical decision making (see chart for details).  Well-appearing patient.  No acute distress.  Suspect recent viral upper respiratory infection, symptoms continue with continued rhonchi will treat with oral doxycycline.  Continue over-the-counter congestion cough medication as needed.  Patient also reports a vaginal complaints and elected for self wet prep, wet prep positive for yeast as well as clue cells.  Discussed treatment with patient.  Will treat with oral Flagyl and Diflucan.  Encourage rest, fluids, supportive care.Discussed indication, risks and benefits of medications with patient.  Discussed follow up with Primary care physician this week. Discussed follow up and return parameters including no resolution or any worsening concerns. Patient verbalized understanding and agreed to plan.   ____________________________________________   FINAL CLINICAL IMPRESSION(S) / ED DIAGNOSES  Final diagnoses:  Bronchitis  Bacterial vaginosis  Yeast vaginitis     ED Discharge Orders         Ordered    doxycycline (VIBRAMYCIN) 100 MG capsule  2 times daily     09/18/18 1339    fluconazole (DIFLUCAN) 150 MG tablet  Daily     09/18/18 1339    metroNIDAZOLE (FLAGYL) 500 MG tablet  2 times daily     09/18/18 1339           Note: This dictation was prepared with Dragon dictation along with smaller phrase technology. Any transcriptional errors that result from this process are unintentional.         Marylene Land, NP 09/18/18 1409

## 2018-09-18 NOTE — ED Triage Notes (Signed)
Patient c/o cough and chest congestion for a week.  Patient states that she recently tested Negative for Flu.  Patient reports vaginal irritation vaginal itching for the past 2-3 days.

## 2018-09-18 NOTE — Discharge Instructions (Signed)
Take medication as prescribed. Rest. Drink plenty of fluids.  ° °Follow up with your primary care physician this week as needed. Return to Urgent care for new or worsening concerns.  ° °

## 2018-10-02 ENCOUNTER — Other Ambulatory Visit: Payer: Self-pay | Admitting: Primary Care

## 2018-10-02 DIAGNOSIS — E785 Hyperlipidemia, unspecified: Secondary | ICD-10-CM

## 2018-10-02 DIAGNOSIS — E119 Type 2 diabetes mellitus without complications: Secondary | ICD-10-CM

## 2018-10-02 DIAGNOSIS — E559 Vitamin D deficiency, unspecified: Secondary | ICD-10-CM

## 2018-10-02 DIAGNOSIS — E538 Deficiency of other specified B group vitamins: Secondary | ICD-10-CM

## 2018-10-13 ENCOUNTER — Other Ambulatory Visit: Payer: Self-pay

## 2018-10-17 ENCOUNTER — Encounter: Payer: Self-pay | Admitting: Primary Care

## 2018-10-18 ENCOUNTER — Other Ambulatory Visit: Payer: Self-pay | Admitting: Primary Care

## 2018-10-18 DIAGNOSIS — R000 Tachycardia, unspecified: Secondary | ICD-10-CM

## 2018-10-18 DIAGNOSIS — E119 Type 2 diabetes mellitus without complications: Secondary | ICD-10-CM

## 2018-10-18 DIAGNOSIS — I10 Essential (primary) hypertension: Secondary | ICD-10-CM

## 2018-10-19 NOTE — Telephone Encounter (Signed)
Pt has multiple cancellations and a no show. Will forward to [place] to decide on refills.

## 2018-10-20 NOTE — Telephone Encounter (Signed)
Refills sent to pharmacy. Please notify patient that she needs an office visit for further refills. She is overdue for a diabetes check. I think she's actually do for CPE.

## 2018-10-23 NOTE — Telephone Encounter (Signed)
lvm asking pt to call office °

## 2018-10-30 NOTE — Telephone Encounter (Signed)
lvm asking pt to call office °

## 2018-11-25 ENCOUNTER — Other Ambulatory Visit: Payer: Self-pay | Admitting: Primary Care

## 2018-11-25 DIAGNOSIS — E119 Type 2 diabetes mellitus without complications: Secondary | ICD-10-CM

## 2018-11-25 DIAGNOSIS — R000 Tachycardia, unspecified: Secondary | ICD-10-CM

## 2018-11-25 DIAGNOSIS — I10 Essential (primary) hypertension: Secondary | ICD-10-CM

## 2018-11-27 NOTE — Telephone Encounter (Signed)
I think these refills are stuck on your side, will you send them to me?

## 2018-11-27 NOTE — Telephone Encounter (Signed)
Refill has been sent 30 days supply

## 2018-12-19 ENCOUNTER — Telehealth: Payer: Self-pay | Admitting: Primary Care

## 2018-12-19 NOTE — Telephone Encounter (Signed)
Per DPR, left detail message for patient to call back to reschedule appointment.

## 2018-12-20 NOTE — Telephone Encounter (Signed)
Patient has been rechedule

## 2018-12-20 NOTE — Telephone Encounter (Signed)
Can you reschedule patient for me? Thank you

## 2018-12-23 ENCOUNTER — Other Ambulatory Visit: Payer: Self-pay | Admitting: Primary Care

## 2018-12-23 DIAGNOSIS — R000 Tachycardia, unspecified: Secondary | ICD-10-CM

## 2018-12-23 DIAGNOSIS — E119 Type 2 diabetes mellitus without complications: Secondary | ICD-10-CM

## 2018-12-23 DIAGNOSIS — I10 Essential (primary) hypertension: Secondary | ICD-10-CM

## 2018-12-26 ENCOUNTER — Encounter: Payer: 59 | Admitting: Primary Care

## 2018-12-27 ENCOUNTER — Encounter: Payer: 59 | Admitting: Primary Care

## 2019-01-02 ENCOUNTER — Encounter: Payer: 59 | Admitting: Primary Care

## 2019-01-03 ENCOUNTER — Encounter: Payer: 59 | Admitting: Primary Care

## 2019-01-06 ENCOUNTER — Other Ambulatory Visit: Payer: Self-pay | Admitting: Primary Care

## 2019-01-06 DIAGNOSIS — K219 Gastro-esophageal reflux disease without esophagitis: Secondary | ICD-10-CM

## 2019-01-13 ENCOUNTER — Other Ambulatory Visit: Payer: Self-pay | Admitting: Primary Care

## 2019-01-13 DIAGNOSIS — E876 Hypokalemia: Secondary | ICD-10-CM

## 2019-01-20 ENCOUNTER — Other Ambulatory Visit: Payer: Self-pay | Admitting: Primary Care

## 2019-01-20 DIAGNOSIS — E119 Type 2 diabetes mellitus without complications: Secondary | ICD-10-CM

## 2019-01-20 DIAGNOSIS — R000 Tachycardia, unspecified: Secondary | ICD-10-CM

## 2019-01-20 DIAGNOSIS — I10 Essential (primary) hypertension: Secondary | ICD-10-CM

## 2019-01-22 MED ORDER — METFORMIN HCL ER 500 MG PO TB24: ORAL_TABLET | ORAL | 0 refills | Status: DC

## 2019-01-22 MED ORDER — GLIPIZIDE 5 MG PO TABS: ORAL_TABLET | ORAL | 0 refills | Status: DC

## 2019-01-22 MED ORDER — METOPROLOL TARTRATE 25 MG PO TABS: 25.0000 mg | ORAL_TABLET | Freq: Two times a day (BID) | ORAL | 0 refills | Status: DC

## 2019-01-22 NOTE — Telephone Encounter (Signed)
Christine Cook, will you please schedule for a virtual visit for follow up of chronic conditions?

## 2019-01-23 ENCOUNTER — Encounter: Payer: Self-pay | Admitting: Primary Care

## 2019-01-23 ENCOUNTER — Ambulatory Visit: Payer: Self-pay | Admitting: Primary Care

## 2019-01-23 ENCOUNTER — Other Ambulatory Visit: Payer: Self-pay

## 2019-01-23 ENCOUNTER — Ambulatory Visit (INDEPENDENT_AMBULATORY_CARE_PROVIDER_SITE_OTHER): Payer: Commercial Managed Care - PPO | Admitting: Primary Care

## 2019-01-23 DIAGNOSIS — I10 Essential (primary) hypertension: Secondary | ICD-10-CM | POA: Diagnosis not present

## 2019-01-23 DIAGNOSIS — K219 Gastro-esophageal reflux disease without esophagitis: Secondary | ICD-10-CM | POA: Diagnosis not present

## 2019-01-23 DIAGNOSIS — R000 Tachycardia, unspecified: Secondary | ICD-10-CM

## 2019-01-23 DIAGNOSIS — E785 Hyperlipidemia, unspecified: Secondary | ICD-10-CM

## 2019-01-23 DIAGNOSIS — F332 Major depressive disorder, recurrent severe without psychotic features: Secondary | ICD-10-CM

## 2019-01-23 DIAGNOSIS — E559 Vitamin D deficiency, unspecified: Secondary | ICD-10-CM

## 2019-01-23 DIAGNOSIS — E119 Type 2 diabetes mellitus without complications: Secondary | ICD-10-CM | POA: Diagnosis not present

## 2019-01-23 DIAGNOSIS — E876 Hypokalemia: Secondary | ICD-10-CM

## 2019-01-23 DIAGNOSIS — E538 Deficiency of other specified B group vitamins: Secondary | ICD-10-CM

## 2019-01-23 NOTE — Assessment & Plan Note (Signed)
Doing well on pantoprazole, continue same. 

## 2019-01-23 NOTE — Progress Notes (Signed)
Subjective:    Patient ID: Christine Cook, female    DOB: 1976/04/05, 43 y.o.   MRN: [id number]  HPI  Virtual Visit via Video Note  I connected with Christine Cook on 01/23/19 at  4:00 PM EDT by a video enabled telemedicine application and verified that I am speaking with the correct person using two identifiers.   I discussed the limitations of evaluation and management by telemedicine and the availability of in person appointments. The patient expressed understanding and agreed to proceed. She is at work, I am in the office.  History of Present Illness:  Christine Cook is a 43 year old female who presents today for follow up of chronic conditions.  1) Essential Hypertension: Currently managed on metoprolol tartrate 25 mg BID. She denies chest pain, dizziness, shortness of breath.   BP Readings from Last 3 Encounters:  09/18/18 132/87  09/12/18 118/64  07/02/18 130/84   2) Type 2 Diabetes:   Current medications include: Glipizide 5 mg BID, Metformin ER 500 mg daily  She is checking her blood glucose once weekly and is getting readings of: Before dinner: 180's  Last A1C: 7.2 in July 2019, repeat A1C pending today Last Eye Exam: Due in September 2020 Last Foot Exam: Due in July 2020 Pneumonia Vaccination: Completed in 2019 ACE/ARB: Urine microalbumin negative in July 2019 Statin: atorvastatin   Diet currently consists of:  Breakfast: Grilled chicken on english muffin Lunch: Skips Dinner: Protein, vegetable, starch  Snacks: Cereal, pie, candy Desserts: 5 nights weekly  Beverages: Soda, little water, sweet tea on occasion  Exercise: She is not exercising  3) MDD: Currently managed on venlafaxine 75 mg BID, Seroquel ER 50 mg, and fluoxetine 60 mg daily. Following with psychiatry and feels well managed.   4) Hyperlipidemia: Currently managed on atorvastatin 80 mg and is compliant daily. Last lipid panel on file was from July 2019 with improvement in trigs to 216, LDL of  160. At that point we increased her atorvastatin to 80 mg from 40 mg. She is due for repeat lipids now.   Observations/Objective:  Appears well. No distress. Speaking in complete sentences.  Assessment and Plan:  See problem based charting  Follow Up Instructions:  We will be in touch regarding at lab only appointment. Make sure to come fasting four hours prior.   It was a pleasure to see you today!    I discussed the assessment and treatment plan with the patient. The patient was provided an opportunity to ask questions and all were answered. The patient agreed with the plan and demonstrated an understanding of the instructions.   The patient was advised to call back or seek an in-person evaluation if the symptoms worsen or if the condition fails to improve as anticipated.     Pleas Koch, NP    Review of Systems  Eyes: Negative for visual disturbance.  Respiratory: Negative for shortness of breath.   Cardiovascular: Negative for chest pain.  Neurological: Negative for dizziness and headaches.  Psychiatric/Behavioral:       Feels well managed by psychiatry        Past Medical History:  Diagnosis Date  . Anxiety   . Benzodiazepine overdose   . Depression   . Diabetes ([place])   . Fatty liver disease, nonalcoholic   . Heart murmur   . Heart palpitations   . High cholesterol   . 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 Hypertension   . Interstitial cystitis   .  Irritable bowel syndrome with diarrhea   . 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 Migraine   . 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 Sleep apnea with hypersomnolence    CPAP study 11-28-09,  10-02-2009 AHI was 16.5, pressure to   . Tachycardia      Social History   Socioeconomic History  . Marital status: Single    Spouse name: Not on file  . Number of children: 1  . Years of education: College  . Highest education level: Not on file  Occupational History  .  Occupation: Support Firefighter: [employer]  . Financial resource strain: Not on file  . Food insecurity:    Worry: Not on file    Inability: Not on file  . Transportation needs:    Medical: Not on file    Non-medical: Not on file  Tobacco Use  . Smoking status: Former Smoker    Packs/day: 0.25    Years: 2.00    Pack years: 0.50    Types: Cigarettes    Last attempt to quit: 2017    Years since quitting: 3.3  . Smokeless tobacco: Never Used  Substance and Sexual Activity  . Alcohol use: No    Frequency: Never  . Drug use: No  . Sexual activity: Yes    Birth control/protection: I.U.D.  Lifestyle  . Physical activity:    Days per week: Not on file    Minutes per session: Not on file  . Stress: Not on file  Relationships  . Social connections:    Talks on phone: Not on file    Gets together: Not on file    Attends religious service: Not on file    Active member of club or organization: Not on file    Attends meetings of clubs or organizations: Not on file    Relationship status: Not on file  . Intimate partner violence:    Fear of current or ex partner: Not on file    Emotionally abused: Not on file    Physically abused: Not on file    Forced sexual activity: Not on file  Other Topics Concern  . Not on file  Social History Narrative   Single.   One daughter.   Works at [employer].   Enjoys relaxing, spending time with family.    Past Surgical History:  Procedure Laterality Date  . ADENOIDECTOMY    . BLADDER SURGERY    . CHOLECYSTECTOMY    . KNEE SURGERY     Left knee x 2   . LEEP  10/2016   CIN 1  . TONSILLECTOMY      Family History  Problem Relation Age of Onset  . Asthma Mother   . Hyperthyroidism Mother   . Rheum arthritis Mother   . Graves' disease Mother   . Sjogren's syndrome Mother   . Asthma Daughter   . Colon cancer Neg Hx     Allergies  Allergen Reactions  . Codeine Hives and Nausea And Vomiting  . Morphine  Hives  . Neurontin [Gabapentin] Other (See Comments)    syncope    Current Outpatient Medications on File Prior to Visit  Medication Sig Dispense Refill  . atorvastatin (LIPITOR) 80 MG tablet Take 1 tablet by mouth every evening for cholesterol. 90 tablet 3  . Cholecalciferol (VITAMIN D3) 2000 units TABS Take by mouth.    . Cyanocobalamin (B-12 PO) Take  1,000 mcg by mouth daily.    . diclofenac (VOLTAREN) 75 MG EC tablet Take 1 tablet (75 mg total) by mouth 2 (two) times daily. 60 tablet 1  . doxycycline (VIBRAMYCIN) 100 MG capsule Take 1 capsule (100 mg total) by mouth 2 (two) times daily. 20 capsule 0  . fluconazole (DIFLUCAN) 150 MG tablet Take 1 tablet (150 mg total) by mouth daily. Take one pill orally, then Repeat in one week 2 tablet 0  . FLUoxetine (PROZAC) 20 MG capsule Take 60 mg by mouth daily.    Marland Kitchen glipiZIDE (GLUCOTROL) 5 MG tablet TAKE 1 TABLET BY MOUTH TWICE DAILY BEFORE A MEAL 60 tablet 0  . ibuprofen (ADVIL,MOTRIN) 600 MG tablet Take 1 tablet (600 mg total) by mouth every 6 (six) hours as needed. 30 tablet 0  . levonorgestrel (MIRENA) 20 MCG/24HR IUD 1 each by Intrauterine route once.    . metFORMIN (GLUCOPHAGE-XR) 500 MG 24 hr tablet Take 1 tablet by mouth once daily with breakfast 30 tablet 0  . metoprolol tartrate (LOPRESSOR) 25 MG tablet Take 1 tablet (25 mg total) by mouth 2 (two) times daily. 60 tablet 0  . pantoprazole (PROTONIX) 40 MG tablet Take 1 tablet by mouth once daily 90 tablet 0  . potassium chloride SA (K-DUR,KLOR-CON) 20 MEQ tablet Take 1 tablet by mouth once daily 90 tablet 0  . QUEtiapine (SEROQUEL XR) 50 MG TB24 24 hr tablet Take 100 mg by mouth at bedtime.    Marland Kitchen venlafaxine (EFFEXOR) 75 MG tablet Take 150 mg by mouth daily.     No current facility-administered medications on file prior to visit.     There were no vitals taken for this visit.   Objective:   Physical Exam  Constitutional: She is oriented to person, place, and time. She appears  well-nourished.  Respiratory: Effort normal. No respiratory distress.  Neurological: She is alert and oriented to person, place, and time.  Skin: Skin is dry.  Psychiatric: She has a normal mood and affect.           Assessment & Plan:

## 2019-01-23 NOTE — Assessment & Plan Note (Signed)
Compliant to atorvastatin 80 mg, repeat lipids pending.

## 2019-01-23 NOTE — Telephone Encounter (Signed)
Lvm asking pt to call office 

## 2019-01-23 NOTE — Assessment & Plan Note (Signed)
Repeat B12 pending. She is taking OTC b12 daily.

## 2019-01-23 NOTE — Assessment & Plan Note (Signed)
Compliant to potassium daily, continue same. BMP pending.

## 2019-01-23 NOTE — Patient Instructions (Signed)
  We will be in touch regarding at lab only appointment. Make sure to come fasting four hours prior.   It was a pleasure to see you today!

## 2019-01-23 NOTE — Assessment & Plan Note (Signed)
Repeat A1C pending, glucose readings do seem too high. Recommended she work on diet, start exercising.  Eye and foot exam UTD. Pneumonia vaccination UTD. Urine microalbumin due in Summer 2020. Managed on statin.  Follow up in 3-6 months based off of lab results.

## 2019-01-23 NOTE — Assessment & Plan Note (Signed)
Compliant to vitamin D daily, repeat levels pending.

## 2019-01-23 NOTE — Assessment & Plan Note (Signed)
Following with psychiatry and doing well. Continue current regimen.

## 2019-01-23 NOTE — Assessment & Plan Note (Signed)
Stable from office visit in December 2019. Continue metoprolol tartrate 25 mg BID.

## 2019-01-24 DIAGNOSIS — R5383 Other fatigue: Secondary | ICD-10-CM

## 2019-01-26 ENCOUNTER — Telehealth: Payer: Self-pay | Admitting: Radiology

## 2019-01-26 NOTE — Telephone Encounter (Signed)
Needs appt info, covid and curb side

## 2019-01-29 ENCOUNTER — Other Ambulatory Visit (INDEPENDENT_AMBULATORY_CARE_PROVIDER_SITE_OTHER): Payer: Commercial Managed Care - PPO

## 2019-01-29 ENCOUNTER — Other Ambulatory Visit: Payer: Self-pay

## 2019-01-29 DIAGNOSIS — E559 Vitamin D deficiency, unspecified: Secondary | ICD-10-CM | POA: Diagnosis not present

## 2019-01-29 DIAGNOSIS — E119 Type 2 diabetes mellitus without complications: Secondary | ICD-10-CM

## 2019-01-29 DIAGNOSIS — E538 Deficiency of other specified B group vitamins: Secondary | ICD-10-CM | POA: Diagnosis not present

## 2019-01-29 DIAGNOSIS — E785 Hyperlipidemia, unspecified: Secondary | ICD-10-CM | POA: Diagnosis not present

## 2019-01-29 DIAGNOSIS — R5383 Other fatigue: Secondary | ICD-10-CM

## 2019-01-29 LAB — LIPID PANEL
Cholesterol: 189 mg/dL (ref 0–200)
HDL: 29.6 mg/dL — ABNORMAL LOW (ref >39.00)
NonHDL: 158.9
Total CHOL/HDL Ratio: 6
Triglycerides: 328 mg/dL — ABNORMAL HIGH (ref 0.0–149.0)
VLDL: 65.6 mg/dL — ABNORMAL HIGH (ref 0.0–40.0)

## 2019-01-29 LAB — CBC
HCT: 39 % (ref 36.0–46.0)
Hemoglobin: 12.6 g/dL (ref 12.0–15.0)
MCHC: 32.4 g/dL (ref 30.0–36.0)
MCV: 80.1 fl (ref 78.0–100.0)
Platelets: 264 10*3/uL (ref 150.0–400.0)
RBC: 4.87 Mil/uL (ref 3.87–5.11)
RDW: 16.6 % — ABNORMAL HIGH (ref 11.5–15.5)
WBC: 8.3 10*3/uL (ref 4.0–10.5)

## 2019-01-29 LAB — COMPREHENSIVE METABOLIC PANEL
ALT: 22 U/L (ref 0–35)
AST: 22 U/L (ref 0–37)
Albumin: 4.3 g/dL (ref 3.5–5.2)
Alkaline Phosphatase: 70 U/L (ref 39–117)
BUN: 11 mg/dL (ref 6–23)
CO2: 27 mEq/L (ref 19–32)
Calcium: 9 mg/dL (ref 8.4–10.5)
Chloride: 103 mEq/L (ref 96–112)
Creatinine, Ser: 0.94 mg/dL (ref 0.40–1.20)
GFR: 64.79 mL/min (ref >60.00)
Glucose, Bld: 167 mg/dL — ABNORMAL HIGH (ref 70–99)
Potassium: 3.9 mEq/L (ref 3.5–5.1)
Sodium: 139 mEq/L (ref 135–145)
Total Bilirubin: 0.5 mg/dL (ref 0.2–1.2)
Total Protein: 7.1 g/dL (ref 6.0–8.3)

## 2019-01-29 LAB — TSH: TSH: 1.47 u[IU]/mL (ref 0.35–4.50)

## 2019-01-29 LAB — LDL CHOLESTEROL, DIRECT: Direct LDL: 113 mg/dL

## 2019-01-29 LAB — HEMOGLOBIN A1C: Hgb A1c MFr Bld: 7.9 % — ABNORMAL HIGH (ref 4.6–6.5)

## 2019-01-29 LAB — VITAMIN B12: Vitamin B-12: >1515 pg/mL — ABNORMAL HIGH (ref 211–911)

## 2019-01-29 LAB — VITAMIN D 25 HYDROXY (VIT D DEFICIENCY, FRACTURES): VITD: 39.98 ng/mL (ref 30.00–100.00)

## 2019-01-30 DIAGNOSIS — E119 Type 2 diabetes mellitus without complications: Secondary | ICD-10-CM

## 2019-01-31 MED ORDER — METFORMIN HCL ER 500 MG PO TB24: 1000.0000 mg | ORAL_TABLET | Freq: Every day | ORAL | 3 refills | Status: DC

## 2019-02-15 ENCOUNTER — Other Ambulatory Visit: Payer: Self-pay

## 2019-02-15 ENCOUNTER — Encounter: Payer: Self-pay | Admitting: Primary Care

## 2019-02-15 ENCOUNTER — Ambulatory Visit: Payer: Commercial Managed Care - PPO | Admitting: Primary Care

## 2019-02-15 VITALS — BP 118/78 | HR 84 | Temp 98.2°F | Ht 65.25 in | Wt 170.2 lb

## 2019-02-15 DIAGNOSIS — M26621 Arthralgia of right temporomandibular joint: Secondary | ICD-10-CM | POA: Diagnosis not present

## 2019-02-15 MED ORDER — CYCLOBENZAPRINE HCL 5 MG PO TABS: 5.0000 mg | ORAL_TABLET | Freq: Three times a day (TID) | ORAL | 0 refills | Status: DC | PRN

## 2019-02-15 NOTE — Progress Notes (Signed)
Subjective:    Patient ID: Christine Cook, female    DOB: [birth year], 43 y.o.   MRN: [id number]  HPI  Christine Cook is a 43 year old female with a history of type 2 diabetes, GERD, OSA, hypertension, who presents today with a chief complaint of facial pain.  Her pain is located to the right upper temporal mandibular region with radiation to the upper and lower mandible that she first noticed three days ago. She's noticed that pain with chewing, also decrease in ROM with opening her mouth. She does grind her teeth during the night, doesn't wear a night guard. She's taken Advil and Tylenol without improvement. She's had mild allergy symptoms, is not blowing anything from her nasal cavity.    She denies fevers, cough, shortness of breath, headaches.   Review of Systems  Constitutional: Negative for fever.  HENT: Negative for congestion, postnasal drip, rhinorrhea and sinus pressure.        Right TMJ pain  Respiratory: Negative for shortness of breath.   Skin: Negative for color change.  Allergic/Immunologic: Positive for environmental allergies.       Past Medical History:  Diagnosis Date  . Anxiety   . Benzodiazepine overdose   . Depression   . Diabetes ([place])   . Fatty liver disease, nonalcoholic   . Heart murmur   . Heart palpitations   . High cholesterol   . 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 Hypertension   . Interstitial cystitis   . Irritable bowel syndrome with diarrhea   . 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 Migraine   . 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 Sleep apnea with hypersomnolence    CPAP study 11-28-09,  10-02-2009 AHI was 16.5, pressure to   . Tachycardia      Social History   Socioeconomic History  . Marital status: Single    Spouse name: Not on file  . Number of children: 1  . Years of education: College  . Highest education level: Not on file  Occupational History  . Occupation: Support Firefighter: [employer]  . Financial resource strain: Not on file  . Food insecurity:    Worry: Not on file    Inability: Not on file  . Transportation needs:    Medical: Not on file    Non-medical: Not on file  Tobacco Use  . Smoking status: Former Smoker    Packs/day: 0.25    Years: 2.00    Pack years: 0.50    Types: Cigarettes    Last attempt to quit: 2017    Years since quitting: 3.3  . Smokeless tobacco: Never Used  Substance and Sexual Activity  . Alcohol use: No    Frequency: Never  . Drug use: No  . Sexual activity: Yes    Birth control/protection: I.U.D.  Lifestyle  . Physical activity:    Days per week: Not on file    Minutes per session: Not on file  . Stress: Not on file  Relationships  . Social connections:    Talks on phone: Not on file    Gets together: Not on file    Attends religious service: Not on file    Active member of club or organization: Not on file    Attends meetings of clubs or organizations: Not on file    Relationship status: Not on file  . Intimate partner violence:  Fear of current or ex partner: Not on file    Emotionally abused: Not on file    Physically abused: Not on file    Forced sexual activity: Not on file  Other Topics Concern  . Not on file  Social History Narrative   Single.   One daughter.   Works at [employer].   Enjoys relaxing, spending time with family.    Past Surgical History:  Procedure Laterality Date  . ADENOIDECTOMY    . BLADDER SURGERY    . CHOLECYSTECTOMY    . KNEE SURGERY     Left knee x 2   . LEEP  10/2016   CIN 1  . TONSILLECTOMY      Family History  Problem Relation Age of Onset  . Asthma Mother   . Hyperthyroidism Mother   . Rheum arthritis Mother   . Graves' disease Mother   . Sjogren's syndrome Mother   . Asthma Daughter   . Colon cancer Neg Hx     Allergies  Allergen Reactions  . Codeine Hives and Nausea And Vomiting  . Morphine Hives  . Neurontin [Gabapentin] Other (See  Comments)    syncope    Current Outpatient Medications on File Prior to Visit  Medication Sig Dispense Refill  . atorvastatin (LIPITOR) 80 MG tablet Take 1 tablet by mouth every evening for cholesterol. 90 tablet 3  . Cholecalciferol (VITAMIN D3) 2000 units TABS Take by mouth.    . Cyanocobalamin (B-12 PO) Take 1,000 mcg by mouth daily.    Marland Kitchen FLUoxetine (PROZAC) 20 MG capsule Take 60 mg by mouth daily.    Marland Kitchen glipiZIDE (GLUCOTROL) 5 MG tablet TAKE 1 TABLET BY MOUTH TWICE DAILY BEFORE A MEAL 60 tablet 0  . levonorgestrel (MIRENA) 20 MCG/24HR IUD 1 each by Intrauterine route once.    . metFORMIN (GLUCOPHAGE-XR) 500 MG 24 hr tablet Take 2 tablets (1,000 mg total) by mouth daily with breakfast. For diabetes. 180 tablet 3  . metoprolol tartrate (LOPRESSOR) 25 MG tablet Take 1 tablet (25 mg total) by mouth 2 (two) times daily. 60 tablet 0  . pantoprazole (PROTONIX) 40 MG tablet Take 1 tablet by mouth once daily 90 tablet 0  . potassium chloride SA (K-DUR,KLOR-CON) 20 MEQ tablet Take 1 tablet by mouth once daily 90 tablet 0  . QUEtiapine (SEROQUEL XR) 50 MG TB24 24 hr tablet Take 100 mg by mouth at bedtime.    Marland Kitchen venlafaxine (EFFEXOR) 75 MG tablet Take 150 mg by mouth daily.     No current facility-administered medications on file prior to visit.     BP 118/78   Pulse 84   Temp 98.2 F (36.8 C) (Oral)   Ht 5' 5.25" (1.657 m)   Wt 170 lb 4 oz (77.2 kg)   BMI 28.11 kg/m    Objective:   Physical Exam  Constitutional: She appears well-nourished. She does not appear ill.  HENT:  Head:    Right Ear: Tympanic membrane and ear canal normal.  Left Ear: Tympanic membrane and ear canal normal.  Nose: No mucosal edema. Right sinus exhibits no maxillary sinus tenderness and no frontal sinus tenderness. Left sinus exhibits no maxillary sinus tenderness and no frontal sinus tenderness.  Mouth/Throat: Oropharynx is clear and moist.  Tenderness at TMJ, parts of right upper and lower mandible.  Neck:  Neck supple.  Respiratory: Effort normal.  Skin: Skin is warm and dry.           Assessment & Plan:

## 2019-02-15 NOTE — Assessment & Plan Note (Signed)
Suspect TMJ to be culprit of pain. She does not appear to be sickly, no erythema or signs of facial cellulitis.   Discussed to start wearing night guards when sleeping and also to follow up with her dentist regarding this chronic issue.  Rx for cyclobenzaprine provided to help with muscle relaxation. Continue Ibuprofen. Work note provided. She will update.

## 2019-02-15 NOTE — Patient Instructions (Signed)
You may take the cyclobenzaprine 5 mg tablets every 8 hours as needed for jaw pain.   Continue Ibuprofen along with the muscle relaxer.  Purchase a night guard as discussed.  Follow up with your dentist when able.  It was a pleasure to see you today!

## 2019-02-17 ENCOUNTER — Other Ambulatory Visit: Payer: Self-pay | Admitting: Primary Care

## 2019-02-17 DIAGNOSIS — R000 Tachycardia, unspecified: Secondary | ICD-10-CM

## 2019-02-17 DIAGNOSIS — I10 Essential (primary) hypertension: Secondary | ICD-10-CM

## 2019-02-21 DIAGNOSIS — M26621 Arthralgia of right temporomandibular joint: Secondary | ICD-10-CM

## 2019-02-21 DIAGNOSIS — I10 Essential (primary) hypertension: Secondary | ICD-10-CM

## 2019-02-21 DIAGNOSIS — R000 Tachycardia, unspecified: Secondary | ICD-10-CM

## 2019-02-21 MED ORDER — METOPROLOL TARTRATE 25 MG PO TABS: 25.0000 mg | ORAL_TABLET | Freq: Two times a day (BID) | ORAL | 2 refills | Status: DC

## 2019-02-22 MED ORDER — ONDANSETRON 4 MG PO TBDP: 4.0000 mg | ORAL_TABLET | Freq: Three times a day (TID) | ORAL | 0 refills | Status: DC | PRN

## 2019-02-22 NOTE — Telephone Encounter (Signed)
Zofran is not on current medication list.

## 2019-02-25 ENCOUNTER — Other Ambulatory Visit: Payer: Self-pay | Admitting: Primary Care

## 2019-02-25 DIAGNOSIS — E119 Type 2 diabetes mellitus without complications: Secondary | ICD-10-CM

## 2019-02-26 DIAGNOSIS — M26621 Arthralgia of right temporomandibular joint: Secondary | ICD-10-CM

## 2019-02-26 DIAGNOSIS — E119 Type 2 diabetes mellitus without complications: Secondary | ICD-10-CM

## 2019-02-27 MED ORDER — CYCLOBENZAPRINE HCL 5 MG PO TABS: 5.0000 mg | ORAL_TABLET | Freq: Three times a day (TID) | ORAL | 0 refills | Status: DC | PRN

## 2019-02-27 MED ORDER — GLIPIZIDE 5 MG PO TABS: ORAL_TABLET | ORAL | 3 refills | Status: DC

## 2019-03-20 ENCOUNTER — Encounter: Payer: 59 | Admitting: Primary Care

## 2019-03-23 DIAGNOSIS — E119 Type 2 diabetes mellitus without complications: Secondary | ICD-10-CM

## 2019-03-25 ENCOUNTER — Other Ambulatory Visit: Payer: Self-pay | Admitting: Primary Care

## 2019-03-25 DIAGNOSIS — E785 Hyperlipidemia, unspecified: Secondary | ICD-10-CM

## 2019-03-25 DIAGNOSIS — K219 Gastro-esophageal reflux disease without esophagitis: Secondary | ICD-10-CM

## 2019-03-26 MED ORDER — PANTOPRAZOLE SODIUM 40 MG PO TBEC: 40.0000 mg | DELAYED_RELEASE_TABLET | Freq: Every day | ORAL | 2 refills | Status: DC

## 2019-03-26 MED ORDER — GLIPIZIDE 10 MG PO TABS: 10.0000 mg | ORAL_TABLET | Freq: Two times a day (BID) | ORAL | 3 refills | Status: DC

## 2019-03-26 MED ORDER — ATORVASTATIN CALCIUM 80 MG PO TABS: ORAL_TABLET | ORAL | 2 refills | Status: DC

## 2019-03-26 NOTE — Telephone Encounter (Signed)
Refills sent in. Please address patient's question. Thank you

## 2019-04-11 ENCOUNTER — Other Ambulatory Visit: Payer: Self-pay | Admitting: Primary Care

## 2019-04-11 DIAGNOSIS — E876 Hypokalemia: Secondary | ICD-10-CM

## 2019-07-09 ENCOUNTER — Other Ambulatory Visit: Payer: Self-pay

## 2019-07-09 ENCOUNTER — Ambulatory Visit: Payer: BC Managed Care – PPO | Admitting: Family Medicine

## 2019-07-09 ENCOUNTER — Encounter: Payer: Self-pay | Admitting: Family Medicine

## 2019-07-09 DIAGNOSIS — M26621 Arthralgia of right temporomandibular joint: Secondary | ICD-10-CM | POA: Diagnosis not present

## 2019-07-09 DIAGNOSIS — M549 Dorsalgia, unspecified: Secondary | ICD-10-CM

## 2019-07-09 MED ORDER — CYCLOBENZAPRINE HCL 5 MG PO TABS: 5.0000 mg | ORAL_TABLET | Freq: Three times a day (TID) | ORAL | 0 refills | Status: DC | PRN

## 2019-07-09 MED ORDER — QUETIAPINE FUMARATE ER 50 MG PO TB24: 50.0000 mg | ORAL_TABLET | Freq: Every day | ORAL | Status: DC

## 2019-07-09 NOTE — Patient Instructions (Signed)
Try tylenol, ice and heat.   Out of work for now.  Use flexeril as needed, sedation caution.  Take care.  Glad to see you.

## 2019-07-09 NOTE — Progress Notes (Signed)
She can only tolerate 50mg  seroquel due to sedation.  She can tolerate the 50mg  dose with relief.  She has f/u pending.    Back pain.  R sided back pain.  Started last night.  Physical job, on her feet, lifting.  No specific trauma.  She was off yesterday.  Pain with deep breath.  No clear trigger.  No rash.  No FCVD.  Some nausea from the pain.  Some upper abd pain.  No dysuria.  No blood in stool.  No h/o renal stones.  Tylenol helped a little.  R sided pain with twisting to the L  Meds, vitals, and allergies reviewed.   ROS: Per HPI unless specifically indicated in ROS section   GEN: nad, alert and oriented HEENT: ncat NECK: supple w/o LA CV: rrr.  PULM: ctab, no inc wob ABD: soft, +bs EXT: no edema SKIN: no acute rash Back not tender to palpation in midline but she has some right-sided paraspinal tenderness inferior to the right scapula.

## 2019-07-11 NOTE — Assessment & Plan Note (Signed)
Likely muscle strain.  Overall benign exam.  See after visit summary.  Routine cautions given to patient.  No ominous findings Try tylenol, ice and heat.   Out of work for now.  Use flexeril as needed, sedation caution.  She agrees with plan.

## 2019-07-30 ENCOUNTER — Ambulatory Visit: Payer: BC Managed Care – PPO | Admitting: Primary Care

## 2019-07-30 ENCOUNTER — Encounter: Payer: Commercial Managed Care - PPO | Admitting: Primary Care

## 2019-08-14 ENCOUNTER — Ambulatory Visit: Payer: BC Managed Care – PPO | Admitting: Primary Care

## 2019-08-17 ENCOUNTER — Other Ambulatory Visit: Payer: Self-pay | Admitting: Primary Care

## 2019-08-17 DIAGNOSIS — R000 Tachycardia, unspecified: Secondary | ICD-10-CM

## 2019-08-17 DIAGNOSIS — I10 Essential (primary) hypertension: Secondary | ICD-10-CM

## 2019-09-11 ENCOUNTER — Other Ambulatory Visit (HOSPITAL_COMMUNITY)
Admission: RE | Admit: 2019-09-11 | Discharge: 2019-09-11 | Disposition: A | Discharge status: Home / Self Care | Payer: BC Managed Care – PPO | Source: Ambulatory Visit | Attending: Primary Care | Admitting: Primary Care

## 2019-09-11 ENCOUNTER — Ambulatory Visit: Payer: BC Managed Care – PPO | Admitting: Primary Care

## 2019-09-11 ENCOUNTER — Other Ambulatory Visit: Payer: Self-pay

## 2019-09-11 ENCOUNTER — Encounter: Payer: Self-pay | Admitting: Primary Care

## 2019-09-11 VITALS — BP 116/74 | HR 73 | Temp 96.2°F | Ht 65.25 in | Wt 171.0 lb

## 2019-09-11 DIAGNOSIS — E119 Type 2 diabetes mellitus without complications: Secondary | ICD-10-CM | POA: Diagnosis not present

## 2019-09-11 DIAGNOSIS — Z124 Encounter for screening for malignant neoplasm of cervix: Secondary | ICD-10-CM | POA: Insufficient documentation

## 2019-09-11 DIAGNOSIS — E785 Hyperlipidemia, unspecified: Secondary | ICD-10-CM | POA: Diagnosis not present

## 2019-09-11 LAB — MICROALBUMIN / CREATININE URINE RATIO
Creatinine,U: 160.5 mg/dL
Microalb Creat Ratio: 0.5 mg/g (ref 0.0–30.0)
Microalb, Ur: 0.8 mg/dL (ref 0.0–1.9)

## 2019-09-11 LAB — POCT GLYCOSYLATED HEMOGLOBIN (HGB A1C): Hemoglobin A1C: 8 % — AB (ref 4.0–5.6)

## 2019-09-11 NOTE — Assessment & Plan Note (Signed)
Pap smear completed today. She plans on re-establishing with GYN as IUD will need to be removed in 2021.

## 2019-09-11 NOTE — Assessment & Plan Note (Signed)
Discussed to resume statin therapy, repeat lipids at next visit.

## 2019-09-11 NOTE — Patient Instructions (Signed)
Stop by the lab prior to leaving today. I will notify you of your results once received.   Resume Metformin ER 500 mg once daily for diabetes. Resume atorvastatin once daily for cholesterol.  Please schedule a physical with me in 6 months.   It was a pleasure to see you today!

## 2019-09-11 NOTE — Assessment & Plan Note (Signed)
A1C today of 8.0 which is above goal. Suspect this is secondary to being without Metformin.  Resume metformin as hers is not on the recall list. Continue Glipizide BID.  Managed on statin. Urine microalbumin pending. Pneumonia vaccination UTD. Foot exam today. Eye exam due soon.  Follow up in 6 months

## 2019-09-11 NOTE — Progress Notes (Signed)
Subjective:    Patient ID: Christine Cook, female    DOB: 07-26-1976, 43 y.o.   MRN: [id number]  HPI  Christine Cook is a 43 year old female who presents today for follow up of diabetes. She is also due for a pap smear.  Current medications include: Glipizide 10 mg BID. She was previously managed on Metformin ER for which she stopped several months ago due to a potential recall.   She is checking her blood glucose 1-2 times weekly and is getting readings of:  After lunch: low to mid 200's  Last A1C: 7.9 in April 2020 Last Eye Exam: No exam in 2020, due in early 2021. Last Foot Exam: Due today Pneumonia Vaccination: Completed in 2019 ACE/ARB: None. Urine microalbumin due today Statin: atorvastatin, she stopped atorvastatin months ago due to lack of insurance.   BP Readings from Last 3 Encounters:  09/11/19 116/74  07/09/19 122/70  02/15/19 118/78     Review of Systems  Respiratory: Negative for shortness of breath.   Cardiovascular: Negative for chest pain.  Genitourinary: Negative for vaginal discharge.  Neurological: Negative for dizziness, numbness and headaches.       Past Medical History:  Diagnosis Date  . Anxiety   . Benzodiazepine overdose   . Depression   . Diabetes ([place])   . Fatty liver disease, nonalcoholic   . Heart murmur   . Heart palpitations   . High cholesterol   . 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 Hypertension   . Interstitial cystitis   . Irritable bowel syndrome with diarrhea   . 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 Migraine   . 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 Sleep apnea with hypersomnolence    CPAP study 11-28-09,  10-02-2009 AHI was 16.5, pressure to   . Tachycardia      Social History   Socioeconomic History  . Marital status: Single    Spouse name: Not on file  . Number of children: 1  . Years of education: College  . Highest education level: Not on file  Occupational History  . 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 Financial resource strain: Not on file  . Food insecurity    Worry: Not on file    Inability: Not on file  . Transportation needs    Medical: Not on file    Non-medical: Not on file  Tobacco Use  . Smoking status: Former Smoker    Packs/day: 0.25    Years: 2.00    Pack years: 0.50    Types: Cigarettes    Quit date: 2017    Years since quitting: 3.9  . Smokeless tobacco: Never Used  Substance and Sexual Activity  . Alcohol use: No    Frequency: Never  . Drug use: No  . Sexual activity: Yes    Birth control/protection: I.U.D.  Lifestyle  . Physical activity    Days per week: Not on file    Minutes per session: Not on file  . Stress: Not on file  Relationships  . Social Herbalist on phone: Not on file    Gets together: Not on file    Attends religious service: Not on file    Active member of club or organization: Not on file    Attends meetings of clubs or organizations: Not on file    Relationship status: Not on file  .  Intimate partner violence    Fear of current or ex partner: Not on file    Emotionally abused: Not on file    Physically abused: Not on file    Forced sexual activity: Not on file  Other Topics Concern  . Not on file  Social History Narrative   Single.   One daughter.   Works at [employer].   Enjoys relaxing, spending time with family.    Past Surgical History:  Procedure Laterality Date  . ADENOIDECTOMY    . BLADDER SURGERY    . CHOLECYSTECTOMY    . KNEE SURGERY     Left knee x 2   . LEEP  10/2016   CIN 1  . TONSILLECTOMY      Family History  Problem Relation Age of Onset  . Asthma Mother   . Hyperthyroidism Mother   . Rheum arthritis Mother   . Graves' disease Mother   . Sjogren's syndrome Mother   . Asthma Daughter   . Colon cancer Neg Hx     Allergies  Allergen Reactions  . Codeine Hives and Nausea And Vomiting  . Morphine Hives  . Neurontin  [Gabapentin] Other (See Comments)    syncope    Current Outpatient Medications on File Prior to Visit  Medication Sig Dispense Refill  . ARIPiprazole (ABILIFY) 10 MG tablet Take 10 mg by mouth daily.    Marland Kitchen atorvastatin (LIPITOR) 80 MG tablet Take 1 tablet by mouth every evening for cholesterol. 90 tablet 2  . Cholecalciferol (VITAMIN D3) 2000 units TABS Take by mouth.    . Cyanocobalamin (B-12 PO) Take 1,000 mcg by mouth daily.    . cyclobenzaprine (FLEXERIL) 5 MG tablet Take 1-2 tablets (5-10 mg total) by mouth 3 (three) times daily as needed for muscle spasms (sedation caution). 20 tablet 0  . glipiZIDE (GLUCOTROL) 10 MG tablet Take 1 tablet (10 mg total) by mouth 2 (two) times daily before a meal. For diabetes. 180 tablet 3  . levonorgestrel (MIRENA) 20 MCG/24HR IUD 1 each by Intrauterine route once.    . metFORMIN (GLUCOPHAGE-XR) 500 MG 24 hr tablet Take 500 mg by mouth daily with breakfast.    . metoprolol tartrate (LOPRESSOR) 25 MG tablet Take 1 tablet by mouth twice daily 60 tablet 0  . ondansetron (ZOFRAN ODT) 4 MG disintegrating tablet Take 1 tablet (4 mg total) by mouth every 8 (eight) hours as needed for nausea or vomiting. 15 tablet 0  . pantoprazole (PROTONIX) 40 MG tablet Take 1 tablet (40 mg total) by mouth daily. 90 tablet 2  . potassium chloride SA (K-DUR) 20 MEQ tablet Take 1 tablet by mouth once daily 90 tablet 1  . QUEtiapine (SEROQUEL XR) 50 MG TB24 24 hr tablet Take 1 tablet (50 mg total) by mouth at bedtime.    Marland Kitchen venlafaxine (EFFEXOR) 75 MG tablet Take 150 mg by mouth daily.     No current facility-administered medications on file prior to visit.     BP 116/74   Pulse 73   Temp (!) 96.2 F (35.7 C) (Temporal)   Ht 5' 5.25" (1.657 m)   Wt 171 lb (77.6 kg)   SpO2 97%   BMI 28.24 kg/m    Objective:   Physical Exam  Constitutional: She appears well-nourished.  Neck: Neck supple.  Cardiovascular: Normal rate and regular rhythm.  Respiratory: Effort normal and  breath sounds normal.  Genitourinary: There is no tenderness or lesion on the right labia. There is no tenderness  or lesion on the left labia. Cervix exhibits discharge. Cervix exhibits no motion tenderness.    Vaginal discharge present.     No vaginal erythema.  No erythema in the vagina.    Genitourinary Comments: Scant amount of whitish green discharge, no foul smell. No vaginal complaints.    Skin: Skin is warm and dry.  Psychiatric: She has a normal mood and affect.           Assessment & Plan:

## 2019-09-12 LAB — CYTOLOGY - PAP
Comment: NEGATIVE
Diagnosis: NEGATIVE
High risk HPV: NEGATIVE

## 2019-09-24 ENCOUNTER — Other Ambulatory Visit: Payer: Self-pay | Admitting: Primary Care

## 2019-09-24 DIAGNOSIS — R000 Tachycardia, unspecified: Secondary | ICD-10-CM

## 2019-09-24 DIAGNOSIS — I10 Essential (primary) hypertension: Secondary | ICD-10-CM

## 2019-12-26 ENCOUNTER — Other Ambulatory Visit: Payer: Self-pay | Admitting: Primary Care

## 2019-12-26 DIAGNOSIS — K219 Gastro-esophageal reflux disease without esophagitis: Secondary | ICD-10-CM

## 2019-12-26 NOTE — Telephone Encounter (Signed)
Pt last seen 09/11/2019 for FU; next OV for CPX on 03/12/20; last refilled pantoprazole 40 mg # 90 x 2 on 03/26/19. Refill pantoprazole 40 mg # 90 x 0 to [facility]. Per protocol.

## 2020-02-02 ENCOUNTER — Ambulatory Visit: Payer: Self-pay | Attending: Internal Medicine

## 2020-02-02 ENCOUNTER — Other Ambulatory Visit: Payer: Self-pay

## 2020-02-02 DIAGNOSIS — Z23 Encounter for immunization: Secondary | ICD-10-CM

## 2020-02-02 NOTE — Progress Notes (Signed)
   Covid-19 Vaccination Clinic  Name:  Christine Cook    MRN: [id number] DOB: 05/28/1976  02/02/2020  Christine Cook was observed post Covid-19 immunization for 15 minutes without incident. She was provided with Vaccine Information Sheet and instruction to access the V-Safe system.   Christine Cook was instructed to call 911 with any severe reactions post vaccine: Marland Kitchen Difficulty breathing  . Swelling of face and throat  . A fast heartbeat  . A bad rash all over body  . Dizziness and weakness   Immunizations Administered    Name Date Dose VIS Date Route   Pfizer COVID-19 Vaccine 02/02/2020 10:22 AM 0.3 mL 11/28/2018 Intramuscular   Manufacturer: [place]   Lot: [id number]   [place]: [id number]

## 2020-02-11 ENCOUNTER — Telehealth: Payer: Self-pay | Admitting: Family Medicine

## 2020-02-11 NOTE — Telephone Encounter (Signed)
Patient would like removal and reinsertion of IUD.

## 2020-02-11 NOTE — Telephone Encounter (Signed)
TC to patient who states she wants Mirena removal/reinsertion. States her Mirena was inserted 01/2014, but not done at [facility]. Patient last PE was 10/05/2016. Patient told she would be put on wait list. Patient agrees to plan.Jenetta Downer, RN

## 2020-02-26 ENCOUNTER — Ambulatory Visit: Payer: Medicaid Other | Attending: Internal Medicine

## 2020-02-26 DIAGNOSIS — Z23 Encounter for immunization: Secondary | ICD-10-CM

## 2020-02-26 NOTE — Progress Notes (Signed)
   Covid-19 Vaccination Clinic  Name:  Christine Cook    MRN: [id number] DOB: [birth year]  02/26/2020  Christine Cook was observed post Covid-19 immunization for 15 minutes without incident. She was provided with Vaccine Information Sheet and instruction to access the V-Safe system.   Christine Cook was instructed to call 911 with any severe reactions post vaccine: Marland Kitchen Difficulty breathing  . Swelling of face and throat  . A fast heartbeat  . A bad rash all over body  . Dizziness and weakness   Immunizations Administered    Name Date Dose VIS Date Route   Pfizer COVID-19 Vaccine 02/26/2020  9:25 AM 0.3 mL 11/28/2018 Intramuscular   Manufacturer: [employer]   Lot: [id number]   [place]: [id number]

## 2020-03-07 ENCOUNTER — Other Ambulatory Visit: Payer: BC Managed Care – PPO

## 2020-03-12 ENCOUNTER — Encounter: Payer: BC Managed Care – PPO | Admitting: Primary Care

## 2020-03-20 ENCOUNTER — Other Ambulatory Visit: Payer: Self-pay | Admitting: Primary Care

## 2020-03-20 DIAGNOSIS — K219 Gastro-esophageal reflux disease without esophagitis: Secondary | ICD-10-CM

## 2020-03-23 ENCOUNTER — Emergency Department
Admission: EM | Admit: 2020-03-23 | Discharge: 2020-03-23 | Disposition: A | Discharge status: Home / Self Care | Payer: Medicaid Other | Attending: Emergency Medicine | Admitting: Emergency Medicine

## 2020-03-23 ENCOUNTER — Other Ambulatory Visit: Payer: Self-pay

## 2020-03-23 DIAGNOSIS — K047 Periapical abscess without sinus: Secondary | ICD-10-CM | POA: Insufficient documentation

## 2020-03-23 DIAGNOSIS — I10 Essential (primary) hypertension: Secondary | ICD-10-CM | POA: Insufficient documentation

## 2020-03-23 DIAGNOSIS — Z87891 Personal history of nicotine dependence: Secondary | ICD-10-CM | POA: Insufficient documentation

## 2020-03-23 DIAGNOSIS — Z79899 Other long term (current) drug therapy: Secondary | ICD-10-CM | POA: Insufficient documentation

## 2020-03-23 MED ORDER — AMOXICILLIN 500 MG PO CAPS
500.0000 mg | ORAL_CAPSULE | Freq: Once | ORAL | Status: AC
  Administered 2020-03-23: 500 mg via ORAL
  Filled 2020-03-23: qty 1

## 2020-03-23 MED ORDER — IBUPROFEN 600 MG PO TABS: 600.0000 mg | ORAL_TABLET | Freq: Three times a day (TID) | ORAL | 0 refills | Status: DC | PRN

## 2020-03-23 MED ORDER — KETOROLAC TROMETHAMINE 30 MG/ML IJ SOLN
30.0000 mg | Freq: Once | INTRAMUSCULAR | Status: AC
  Administered 2020-03-23: 30 mg via INTRAVENOUS
  Filled 2020-03-23: qty 1

## 2020-03-23 MED ORDER — AMOXICILLIN 500 MG PO CAPS
500.0000 mg | ORAL_CAPSULE | Freq: Three times a day (TID) | ORAL | 0 refills | Status: DC
Start: 2020-03-23 — End: 2020-08-06

## 2020-03-23 NOTE — ED Notes (Signed)
Pt with c/o of right lower side dental pain that radiates into her neck. Pt states pain started 2 days ago and has increased. Mild swelling noted to face and neck.

## 2020-03-23 NOTE — ED Triage Notes (Signed)
Pt c/o R lower dental abscess x 2 days. Some swelling noted. A&O, ambulatory.

## 2020-03-23 NOTE — Discharge Instructions (Addendum)
You were seen today for dental abscess. You received a Toradol injection and a dose of antibiotics in the ER. I have given you prescriptions for anti-inflammatories and antibiotics. Please follow-up with your dentist if this isn't improving.

## 2020-03-23 NOTE — ED Provider Notes (Signed)
[facility] Emergency Department Provider Note ____________________________________________  Time seen: 1025  I have reviewed the triage vital signs and the nursing notes.  HISTORY  Chief Complaint  Dental Pain   HPI Christine Cook is a 44 y.o. female presents to the ER with complaint of dental pain.  She reports a history of broken tooth in her right lower jaw.  She reports increased pain and swelling.  She has not noticed any discharge from the area.  She denies fever, chills or nausea.  She has not taken anything OTC for symptoms.  She has a Pharmacist, community but does not see them routinely due to lack of insurance.  Past Medical History:  Diagnosis Date  . Anxiety   . Benzodiazepine overdose   . Depression   . Diabetes ([place])   . Fatty liver disease, nonalcoholic   . Heart murmur   . Heart palpitations   . High cholesterol   . 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 Hypertension   . Interstitial cystitis   . Irritable bowel syndrome with diarrhea   . 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 Migraine   . 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 Sleep apnea with hypersomnolence    CPAP study 11-28-09,  10-02-2009 AHI was 16.5, pressure to   . Tachycardia     Patient Active Problem List   Diagnosis Date Noted  . Screening for cervical cancer 09/11/2019  . Arthralgia of right temporomandibular joint 02/15/2019  . Plantar fasciitis 01/02/2018  . Back pain 01/02/2018  . Hypokalemia 11/29/2017  . Cervical dysplasia 12/01/2016  . Hyperlipidemia 08/05/2016  . Tachycardia 08/05/2016  . OSA (obstructive sleep apnea) 07/12/2016  . 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 Vitamin B12 deficiency 07/12/2016  . Vitamin D deficiency 07/12/2016  . Diabetes mellitus without complication ([place]) 28/78/6767  . 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 Impulse control disorder (gambling) 07/11/2016  . Hypertension 07/09/2016  . GERD 01/21/2009    Past Surgical  History:  Procedure Laterality Date  . ADENOIDECTOMY    . BLADDER SURGERY    . CHOLECYSTECTOMY    . KNEE SURGERY     Left knee x 2   . LEEP  10/2016   CIN 1  . TONSILLECTOMY      Prior to Admission medications   Medication Sig Start Date End Date Taking? Authorizing Provider  amoxicillin (AMOXIL) 500 MG capsule Take 1 capsule (500 mg total) by mouth 3 (three) times daily. 03/23/20   Jearld Fenton, NP  ARIPiprazole (ABILIFY) 10 MG tablet Take 10 mg by mouth daily.    [provider]  atorvastatin (LIPITOR) 80 MG tablet Take 1 tablet by mouth every evening for cholesterol. 03/26/19   Pleas Koch, NP  Cholecalciferol (VITAMIN D3) 2000 units TABS Take by mouth.    [provider]  Cyanocobalamin (B-12 PO) Take 1,000 mcg by mouth daily.    [provider]  cyclobenzaprine (FLEXERIL) 5 MG tablet Take 1-2 tablets (5-10 mg total) by mouth 3 (three) times daily as needed for muscle spasms (sedation caution). 07/09/19   Tonia Ghent, MD  glipiZIDE (GLUCOTROL) 10 MG tablet Take 1 tablet (10 mg total) by mouth 2 (two) times daily before a meal. For diabetes. 03/26/19   Pleas Koch, NP  ibuprofen (ADVIL) 600 MG tablet Take 1 tablet (600 mg total) by mouth every 8 (eight) hours as needed. 03/23/20   Jearld Fenton, NP  levonorgestrel (MIRENA)  20 MCG/24HR IUD 1 each by Intrauterine route once. 01/16/14   Hassell Done, FNP  metFORMIN (GLUCOPHAGE-XR) 500 MG 24 hr tablet Take 500 mg by mouth daily with breakfast.    [provider]  metoprolol tartrate (LOPRESSOR) 25 MG tablet Take 1 tablet by mouth twice daily 09/26/19   Pleas Koch, NP  ondansetron (ZOFRAN ODT) 4 MG disintegrating tablet Take 1 tablet (4 mg total) by mouth every 8 (eight) hours as needed for nausea or vomiting. 02/22/19   Pleas Koch, NP  pantoprazole (PROTONIX) 40 MG tablet Take 1 tablet by mouth once daily 03/20/20   Pleas Koch, NP  potassium chloride SA (K-DUR) 20  MEQ tablet Take 1 tablet by mouth once daily 04/12/19   Pleas Koch, NP  QUEtiapine (SEROQUEL XR) 50 MG TB24 24 hr tablet Take 1 tablet (50 mg total) by mouth at bedtime. 07/09/19   Tonia Ghent, MD  venlafaxine (EFFEXOR) 75 MG tablet Take 150 mg by mouth daily.    [provider]    Allergies Codeine, Morphine, and Neurontin [gabapentin]  Family History  Problem Relation Age of Onset  . Asthma Mother   . Hyperthyroidism Mother   . Rheum arthritis Mother   . Graves' disease Mother   . Sjogren's syndrome Mother   . Migraines Mother   . Asthma Daughter   . Migraines Daughter   . Colon cancer Neg Hx     Social History Social History   Tobacco Use  . Smoking status: Former Smoker    Packs/day: 0.25    Years: 2.00    Pack years: 0.50    Types: Cigarettes    Quit date: 2017    Years since quitting: 4.4  . Smokeless tobacco: Never Used  Vaping Use  . Vaping Use: Never used  Substance Use Topics  . Alcohol use: No  . Drug use: No    Review of Systems  Constitutional: Negative for fever, chills or body aches. ENT: Positive for dental pain. Cardiovascular: Negative for chest pain or chest tightness. Respiratory: Negative for cough or shortness of breath. ____________________________________________  PHYSICAL EXAM:  VITAL SIGNS: ED Triage Vitals  Enc Vitals Group     BP 03/23/20 0900 (!) 151/87     Pulse Rate 03/23/20 0900 72     Resp 03/23/20 0900 17     Temp 03/23/20 0900 98.2 F (36.8 C)     Temp Source 03/23/20 0900 Oral     SpO2 03/23/20 0900 98 %     Weight 03/23/20 0900 170 lb (77.1 kg)     Height 03/23/20 0900 5\' 5"  (1.651 m)     Head Circumference --      Peak Flow --      Pain Score 03/23/20 0911 6     Pain Loc --      Pain Edu? --      Excl. in [place]? --     Constitutional: Alert and oriented.  Obese, in no distress. Head: Normocephalic and atraumatic. Mouth/Throat: Mucous membranes are moist.  Broken tooth noted right lower  mandible with swelling and redness of the gum. Hematological/Lymphatic/Immunological: No cervical lymphadenopathy. Cardiovascular: Normal rate, regular rhythm.  Respiratory: Normal respiratory effort. No wheezes/rales/rhonchi. Neurologic:  Normal speech and language. No gross focal neurologic deficits are appreciated.  ____________________________________________   INITIAL IMPRESSION / ASSESSMENT AND PLAN / ED COURSE  Abscessed Tooth:  Toradol 30 mg IM x 1 Amoxil 500 mg PO x 1 RX for  Amoxil 500 mg PO TID x 10 days RX for Ibuprofen 600 mg TID prn Can gargle with salt water for comfort Follow up with dentist as an outpatient ____________________________________________  FINAL CLINICAL IMPRESSION(S) / ED DIAGNOSES  Final diagnoses:  Abscessed tooth      Jearld Fenton, NP 03/23/20 1049    Earleen Newport, MD 03/23/20 1416

## 2020-03-23 NOTE — ED Notes (Signed)
Pt verbalized understanding of discharge instructions. NAD at this time. 

## 2020-04-07 ENCOUNTER — Emergency Department
Admission: EM | Admit: 2020-04-07 | Discharge: 2020-04-07 | Disposition: A | Discharge status: Home / Self Care | Payer: Self-pay | Attending: Emergency Medicine | Admitting: Emergency Medicine

## 2020-04-07 ENCOUNTER — Other Ambulatory Visit: Payer: Self-pay

## 2020-04-07 ENCOUNTER — Emergency Department: Payer: Self-pay

## 2020-04-07 DIAGNOSIS — Z8505 Personal history of malignant neoplasm of liver: Secondary | ICD-10-CM | POA: Insufficient documentation

## 2020-04-07 DIAGNOSIS — E119 Type 2 diabetes mellitus without complications: Secondary | ICD-10-CM | POA: Insufficient documentation

## 2020-04-07 DIAGNOSIS — G43901 Migraine, unspecified, not intractable, with status migrainosus: Secondary | ICD-10-CM | POA: Insufficient documentation

## 2020-04-07 DIAGNOSIS — I10 Essential (primary) hypertension: Secondary | ICD-10-CM | POA: Insufficient documentation

## 2020-04-07 DIAGNOSIS — Z87891 Personal history of nicotine dependence: Secondary | ICD-10-CM | POA: Insufficient documentation

## 2020-04-07 DIAGNOSIS — Z7984 Long term (current) use of oral hypoglycemic drugs: Secondary | ICD-10-CM | POA: Insufficient documentation

## 2020-04-07 DIAGNOSIS — Z79899 Other long term (current) drug therapy: Secondary | ICD-10-CM | POA: Insufficient documentation

## 2020-04-07 LAB — BASIC METABOLIC PANEL
Anion gap: 12 (ref 5–15)
BUN: 10 mg/dL (ref 6–20)
CO2: 24 mmol/L (ref 22–32)
Calcium: 8.5 mg/dL — ABNORMAL LOW (ref 8.9–10.3)
Chloride: 102 mmol/L (ref 98–111)
Creatinine, Ser: 0.75 mg/dL (ref 0.44–1.00)
GFR calc Af Amer: >60 mL/min (ref >60)
GFR calc non Af Amer: >60 mL/min (ref >60)
Glucose, Bld: 210 mg/dL — ABNORMAL HIGH (ref 70–99)
Potassium: 3.2 mmol/L — ABNORMAL LOW (ref 3.5–5.1)
Sodium: 138 mmol/L (ref 135–145)

## 2020-04-07 LAB — CBC
HCT: 39 % (ref 36.0–46.0)
Hemoglobin: 12.4 g/dL (ref 12.0–15.0)
MCH: 25.4 pg — ABNORMAL LOW (ref 26.0–34.0)
MCHC: 31.8 g/dL (ref 30.0–36.0)
MCV: 79.9 fL — ABNORMAL LOW (ref 80.0–100.0)
Platelets: 282 10*3/uL (ref 150–400)
RBC: 4.88 MIL/uL (ref 3.87–5.11)
RDW: 16.6 % — ABNORMAL HIGH (ref 11.5–15.5)
WBC: 9 10*3/uL (ref 4.0–10.5)
nRBC: 0 % (ref 0.0–0.2)

## 2020-04-07 MED ORDER — METHYLPREDNISOLONE SODIUM SUCC 125 MG IJ SOLR
125.0000 mg | Freq: Once | INTRAMUSCULAR | Status: AC
  Administered 2020-04-07: 125 mg via INTRAVENOUS
  Filled 2020-04-07: qty 2

## 2020-04-07 MED ORDER — MAGNESIUM SULFATE 2 GM/50ML IV SOLN
2.0000 g | INTRAVENOUS | Status: AC
  Administered 2020-04-07: 2 g via INTRAVENOUS
  Filled 2020-04-07: qty 50

## 2020-04-07 MED ORDER — LORAZEPAM 2 MG/ML IJ SOLN
1.0000 mg | Freq: Once | INTRAMUSCULAR | Status: AC
  Administered 2020-04-07: 1 mg via INTRAVENOUS
  Filled 2020-04-07: qty 1

## 2020-04-07 MED ORDER — ACETAMINOPHEN 500 MG PO TABS
1000.0000 mg | ORAL_TABLET | Freq: Once | ORAL | Status: AC
  Administered 2020-04-07: 1000 mg via ORAL
  Filled 2020-04-07: qty 2

## 2020-04-07 MED ORDER — NAPROXEN 500 MG PO TABS
500.0000 mg | ORAL_TABLET | Freq: Two times a day (BID) | ORAL | 0 refills | Status: DC
Start: 2020-04-07 — End: 2020-08-06

## 2020-04-07 MED ORDER — DIPHENHYDRAMINE HCL 25 MG PO CAPS
50.0000 mg | ORAL_CAPSULE | Freq: Four times a day (QID) | ORAL | 0 refills | Status: DC | PRN
Start: 2020-04-07 — End: 2024-04-13

## 2020-04-07 MED ORDER — SODIUM CHLORIDE 0.9 % IV BOLUS
500.0000 mL | Freq: Once | INTRAVENOUS | Status: AC
  Administered 2020-04-07: 500 mL via INTRAVENOUS

## 2020-04-07 MED ORDER — ONDANSETRON HCL 4 MG/2ML IJ SOLN
4.0000 mg | Freq: Once | INTRAMUSCULAR | Status: AC
  Administered 2020-04-07: 4 mg via INTRAVENOUS
  Filled 2020-04-07: qty 2

## 2020-04-07 MED ORDER — METOCLOPRAMIDE HCL 5 MG/ML IJ SOLN
10.0000 mg | Freq: Once | INTRAMUSCULAR | Status: AC
  Administered 2020-04-07: 10 mg via INTRAVENOUS
  Filled 2020-04-07: qty 2

## 2020-04-07 MED ORDER — IOHEXOL 350 MG/ML SOLN
75.0000 mL | Freq: Once | INTRAVENOUS | Status: AC | PRN
  Administered 2020-04-07: 75 mL via INTRAVENOUS

## 2020-04-07 MED ORDER — DIPHENHYDRAMINE HCL 50 MG/ML IJ SOLN
25.0000 mg | Freq: Once | INTRAMUSCULAR | Status: AC
  Administered 2020-04-07: 25 mg via INTRAVENOUS
  Filled 2020-04-07: qty 1

## 2020-04-07 MED ORDER — METOCLOPRAMIDE HCL 10 MG PO TABS
10.0000 mg | ORAL_TABLET | Freq: Four times a day (QID) | ORAL | 0 refills | Status: DC | PRN
Start: 2020-04-07 — End: 2024-04-13

## 2020-04-07 MED ORDER — KETOROLAC TROMETHAMINE 30 MG/ML IJ SOLN
15.0000 mg | INTRAMUSCULAR | Status: AC
  Administered 2020-04-07: 15 mg via INTRAVENOUS
  Filled 2020-04-07: qty 1

## 2020-04-07 NOTE — ED Triage Notes (Signed)
Pt from home via [facility]. Pt c/o "worst headache of her life", pt states vomiting at home, none with EMS. Pt has hx migraines, "but this is worse".  Pt has hx sleep apnea, no CPAP at home. Pt on abx x2 weeks for dental abscess, pt states they are finished now.   NAD noted, vss. A&O, ambulatory, with light sensitivity.

## 2020-04-07 NOTE — ED Notes (Signed)
Pt assisted to bathroom. Able to ambulate independently.

## 2020-04-07 NOTE — ED Notes (Signed)
Pt able to tolerate PO challenge. MD [facility] aware.

## 2020-04-07 NOTE — Discharge Instructions (Signed)
Your CT scan of the head okay today.  Continue taking medications as prescribed as needed for your migraine symptoms until resolved.  Focus on staying well hydrated.  You may need to wean off of anti-inflammatory pain medicine by taking naproxen intermittently (1 or 2 times a day) over the next few days until your headache resolves.

## 2020-04-07 NOTE — ED Provider Notes (Signed)
[facility] Emergency Department Provider Note  ____________________________________________  Time seen: Approximately 9:34 AM  I have reviewed the triage vital signs and the nursing notes.   HISTORY  Chief Complaint Headache    HPI Christine Cook is a 44 y.o. female with a history of anxiety depression diabetes hypertension who comes to the ED complaining of severe headache present in a bandlike distribution around the forehead and occipital scalp, throbbing, nonradiating.  Woke her from sleep Saturday morning with sudden severe pain after which time she took some of her usual headache medicine and it seemed to substantially improve, but then recurred this morning as well .  Denies vision changes weakness paresthesias confusion or other concerns.  She reports poor oral intake over last 2 days in the setting of this headache which is associated with vomiting.     Past Medical History:  Diagnosis Date  . Anxiety   . Benzodiazepine overdose   . Depression   . Diabetes ([place])   . Fatty liver disease, nonalcoholic   . Heart murmur   . Heart palpitations   . High cholesterol   . 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 Hypertension   . Interstitial cystitis   . Irritable bowel syndrome with diarrhea   . 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 Migraine   . 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 Sleep apnea with hypersomnolence    CPAP study 11-28-09,  10-02-2009 AHI was 16.5, pressure to   . Tachycardia      Patient Active Problem List   Diagnosis Date Noted  . Screening for cervical cancer 09/11/2019  . Arthralgia of right temporomandibular joint 02/15/2019  . Plantar fasciitis 01/02/2018  . Back pain 01/02/2018  . Hypokalemia 11/29/2017  . Cervical dysplasia 12/01/2016  . Hyperlipidemia 08/05/2016  . Tachycardia 08/05/2016  . OSA (obstructive sleep apnea) 07/12/2016  . 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 Vitamin B12 deficiency  07/12/2016  . Vitamin D deficiency 07/12/2016  . Diabetes mellitus without complication ([place]) 95/63/8756  . 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 Impulse control disorder (gambling) 07/11/2016  . Hypertension 07/09/2016  . GERD 01/21/2009     Past Surgical History:  Procedure Laterality Date  . ADENOIDECTOMY    . BLADDER SURGERY    . CHOLECYSTECTOMY    . KNEE SURGERY     Left knee x 2   . LEEP  10/2016   CIN 1  . TONSILLECTOMY       Prior to Admission medications   Medication Sig Start Date End Date Taking? Authorizing Provider  amoxicillin (AMOXIL) 500 MG capsule Take 1 capsule (500 mg total) by mouth 3 (three) times daily. 03/23/20   Jearld Fenton, NP  ARIPiprazole (ABILIFY) 10 MG tablet Take 10 mg by mouth daily.    [provider]  atorvastatin (LIPITOR) 80 MG tablet Take 1 tablet by mouth every evening for cholesterol. 03/26/19   Pleas Koch, NP  Cholecalciferol (VITAMIN D3) 2000 units TABS Take by mouth.    [provider]  Cyanocobalamin (B-12 PO) Take 1,000 mcg by mouth daily.    [provider]  cyclobenzaprine (FLEXERIL) 5 MG tablet Take 1-2 tablets (5-10 mg total) by mouth 3 (three) times daily as needed for muscle spasms (sedation caution). 07/09/19   Tonia Ghent, MD  diphenhydrAMINE (BENADRYL) 25 mg capsule Take 2 capsules (50 mg total) by mouth every 6 (six) hours as needed. 04/07/20   Carrie Mew,  MD  glipiZIDE (GLUCOTROL) 10 MG tablet Take 1 tablet (10 mg total) by mouth 2 (two) times daily before a meal. For diabetes. 03/26/19   Pleas Koch, NP  ibuprofen (ADVIL) 600 MG tablet Take 1 tablet (600 mg total) by mouth every 8 (eight) hours as needed. 03/23/20   Jearld Fenton, NP  levonorgestrel (MIRENA) 20 MCG/24HR IUD 1 each by Intrauterine route once. 01/16/14   Hassell Done, FNP  metFORMIN (GLUCOPHAGE-XR) 500 MG 24 hr tablet Take 500 mg by mouth daily with breakfast.    [provider]   metoCLOPramide (REGLAN) 10 MG tablet Take 1 tablet (10 mg total) by mouth every 6 (six) hours as needed. 04/07/20   Carrie Mew, MD  metoprolol tartrate (LOPRESSOR) 25 MG tablet Take 1 tablet by mouth twice daily 09/26/19   Pleas Koch, NP  naproxen (NAPROSYN) 500 MG tablet Take 1 tablet (500 mg total) by mouth 2 (two) times daily with a meal. 04/07/20   Carrie Mew, MD  ondansetron (ZOFRAN ODT) 4 MG disintegrating tablet Take 1 tablet (4 mg total) by mouth every 8 (eight) hours as needed for nausea or vomiting. 02/22/19   Pleas Koch, NP  pantoprazole (PROTONIX) 40 MG tablet Take 1 tablet by mouth once daily 03/20/20   Pleas Koch, NP  potassium chloride SA (K-DUR) 20 MEQ tablet Take 1 tablet by mouth once daily 04/12/19   Pleas Koch, NP  QUEtiapine (SEROQUEL XR) 50 MG TB24 24 hr tablet Take 1 tablet (50 mg total) by mouth at bedtime. 07/09/19   Tonia Ghent, MD  venlafaxine (EFFEXOR) 75 MG tablet Take 150 mg by mouth daily.    [provider]     Allergies Codeine, Morphine, and Neurontin [gabapentin]   Family History  Problem Relation Age of Onset  . Asthma Mother   . Hyperthyroidism Mother   . Rheum arthritis Mother   . Graves' disease Mother   . Sjogren's syndrome Mother   . Migraines Mother   . Asthma Daughter   . Migraines Daughter   . Colon cancer Neg Hx     Social History Social History   Tobacco Use  . Smoking status: Former Smoker    Packs/day: 0.25    Years: 2.00    Pack years: 0.50    Types: Cigarettes    Quit date: 2017    Years since quitting: 4.5  . Smokeless tobacco: Never Used  Vaping Use  . Vaping Use: Never used  Substance Use Topics  . Alcohol use: No  . Drug use: No    Review of Systems  Constitutional:   No fever or chills.  ENT:   No sore throat. No rhinorrhea.  No neck stiffness Cardiovascular:   No chest pain or syncope. Respiratory:   No dyspnea or cough. Gastrointestinal:   Negative for  abdominal pain, positive vomiting Musculoskeletal:   Negative for focal pain or swelling All other systems reviewed and are negative except as documented above in ROS and HPI.  ____________________________________________   PHYSICAL EXAM:  VITAL SIGNS: ED Triage Vitals  Enc Vitals Group     BP 04/07/20 0614 138/82     Pulse Rate 04/07/20 0614 62     Resp 04/07/20 0614 18     Temp 04/07/20 0614 97.7 F (36.5 C)     Temp Source 04/07/20 0614 Oral     SpO2 04/07/20 0611 97 %     Weight 04/07/20 0615 170 lb (77.1 kg)  Height 04/07/20 0615 5\' 5"  (1.651 m)     Head Circumference --      Peak Flow --      Pain Score 04/07/20 0614 8     Pain Loc --      Pain Edu? --      Excl. in [place]? --     Vital signs reviewed, nursing assessments reviewed.   Constitutional:   Alert and oriented. Non-toxic appearance. Eyes:   Conjunctivae are normal. EOMI. PERRL. ENT      Head:   Normocephalic and atraumatic.      Nose:   Normal      Mouth/Throat: Normal, moist mucosa, no dental abscess.  Poor dentition.      Neck:   No meningismus. Full ROM.  Thyroid nonpalpable.  No midline spinal tenderness Hematological/Lymphatic/Immunilogical:   No cervical lymphadenopathy. Cardiovascular:   RRR. Symmetric bilateral radial and DP pulses.  No murmurs. Cap refill less than 2 seconds. Respiratory:   Normal respiratory effort without tachypnea/retractions. Breath sounds are clear and equal bilaterally. No wheezes/rales/rhonchi. Gastrointestinal:   Soft and nontender. Non distended. There is no CVA tenderness.  No rebound, rigidity, or guarding.  Musculoskeletal:   Normal range of motion in all extremities. No joint effusions.  No lower extremity tenderness.  No edema. Neurologic:   Normal speech and language.  Motor grossly intact. No pronator drift.  Finger-to-nose normal No acute focal neurologic deficits are appreciated.  Skin:    Skin is warm, dry and intact. No rash noted.  No petechiae, purpura,  or bullae.  ____________________________________________    LABS (pertinent positives/negatives) (all labs ordered are listed, but only abnormal results are displayed) Labs Reviewed  CBC - Abnormal; Notable for the following components:      Result Value   MCV 79.9 (*)    MCH 25.4 (*)    RDW 16.6 (*)    All other components within normal limits  BASIC METABOLIC PANEL - Abnormal; Notable for the following components:   Potassium 3.2 (*)    Glucose, Bld 210 (*)    Calcium 8.5 (*)    All other components within normal limits   ____________________________________________   EKG    ____________________________________________    RADIOLOGY  CT Angio Head W or Wo Contrast  Result Date: 04/07/2020 CLINICAL DATA:  44 year old female with acute onset headache with vomiting. EXAM: CT ANGIOGRAPHY HEAD TECHNIQUE: Multidetector CT imaging of the head was performed using the standard protocol during bolus administration of intravenous contrast. Multiplanar CT image reconstructions and MIPs were obtained to evaluate the vascular anatomy. CONTRAST:  52mL OMNIPAQUE IOHEXOL 350 MG/ML SOLN COMPARISON:  Brain MRI 07/17/2004 FINDINGS: CT HEAD Brain: Cerebral volume remains normal. No midline shift, ventriculomegaly, mass effect, evidence of mass lesion, intracranial hemorrhage or evidence of cortically based acute infarction. Gray-white matter differentiation is within normal limits throughout the brain. No suspicious intracranial vascular hyperdensity. Calvarium and skull base: Negative. Paranasal sinuses: Minimal mucosal thickening in the visible left maxillary sinus is stable since 2005. Other Visualized paranasal sinuses and mastoids are clear. Orbits: Visualized orbits and scalp soft tissues are within normal limits. CTA HEAD Posterior circulation: Dominant right vertebral artery which primarily supplies the basilar. Diminutive but patent distal left vertebral artery, functionally terminates in PICA.  Normal right PICA origin. No distal vertebral stenosis. Patent basilar artery without stenosis. Patent SCA and PCA origins. Posterior communicating arteries are diminutive or absent. Bilateral PCA branches are within normal limits. Anterior circulation: Negative distal cervical ICAs. Both  ICA siphons are patent. Trace calcified plaque on the left. No siphon stenosis. Normal ophthalmic artery origins. Patent carotid termini, MCA and ACA origins. Tortuous ACA A1 segments. Normal anterior communicating artery. Bilateral ACA branches are within normal limits. Left MCA M1 segment, bifurcation, and left MCA branches are within normal limits. Right MCA M1 segment, trifurcation, and right MCA branches are within normal limits. Venous sinuses: Patent. The right transverse and sigmoid sinuses appear dominant, those on the left diminutive. Anatomic variants: Dominant right vertebral artery supplies the basilar, the left functionally terminates in PICA. Dominant right transverse and sigmoid sinuses. Review of the MIP images confirms the above findings IMPRESSION: 1. Negative intracranial CTA; trace left ICA siphon calcified atherosclerosis. 2. Normal CT appearance of the brain. No acute intracranial abnormality. Electronically Signed   By: Genevie Ann M.D.   On: 04/07/2020 09:18    ____________________________________________   PROCEDURES Procedures  ____________________________________________  DIFFERENTIAL DIAGNOSIS   Migraine headache, analgesic rebound headache, intracranial aneurysm, cerebral hemorrhage  CLINICAL IMPRESSION / ASSESSMENT AND PLAN / ED COURSE  Medications ordered in the ED: Medications  ondansetron (ZOFRAN) injection 4 mg (4 mg Intravenous Given 04/07/20 0621)  sodium chloride 0.9 % bolus 500 mL (0 mLs Intravenous Stopped 04/07/20 1056)  metoCLOPramide (REGLAN) injection 10 mg (10 mg Intravenous Given 04/07/20 0823)  diphenhydrAMINE (BENADRYL) injection 25 mg (25 mg Intravenous Given 04/07/20 0822)   ketorolac (TORADOL) 30 MG/ML injection 15 mg (15 mg Intravenous Given 04/07/20 0822)  iohexol (OMNIPAQUE) 350 MG/ML injection 75 mL (75 mLs Intravenous Contrast Given 04/07/20 0846)  magnesium sulfate IVPB 2 g 50 mL (0 g Intravenous Stopped 04/07/20 1056)  methylPREDNISolone sodium succinate (SOLU-MEDROL) 125 mg/2 mL injection 125 mg (125 mg Intravenous Given 04/07/20 0947)  LORazepam (ATIVAN) injection 1 mg (1 mg Intravenous Given 04/07/20 0948)  acetaminophen (TYLENOL) tablet 1,000 mg (1,000 mg Oral Given 04/07/20 0947)    Pertinent labs & imaging results that were available during my care of the patient were reviewed by me and considered in my medical decision making (see chart for details).  Christine Cook was evaluated in Emergency Department on 04/07/2020 for the symptoms described in the history of present illness. She was evaluated in the context of the global COVID-19 pandemic, which necessitated consideration that the patient might be at risk for infection with the SARS-CoV-2 virus that causes COVID-19. Institutional protocols and algorithms that pertain to the evaluation of patients at risk for COVID-19 are in a state of rapid change based on information released by regulatory bodies including the CDC and federal and state organizations. These policies and algorithms were followed during the patient's care in the ED.   Patient presents with headache which is similar to prior syndrome but more intense, most likely migraine or analgesic rebound.  However, with sudden severe onset will obtain CT angiogram of the head to evaluate for vascular pathology.  I will give IV Reglan Benadryl Toradol and IV fluids for symptom relief.  ----------------------------------------- 9:40 AM on 04/07/2020 -----------------------------------------  Headache still persistent.  CT angiogram unremarkable.  We will plan to give IV magnesium, Solu-Medrol, Ativan, oral  Tylenol.   ----------------------------------------- 11:15 AM on 04/07/2020 -----------------------------------------  Patient is feeling much better, pain is now 4/10.  She is ambulatory, clear speech.  Will ensure she is tolerating oral intake and plan to discharge home with continued medications for symptomatic relief.     ____________________________________________   FINAL CLINICAL IMPRESSION(S) / ED DIAGNOSES    Final diagnoses:  Migraine  with status migrainosus, not intractable, unspecified migraine type     ED Discharge Orders         Ordered    metoCLOPramide (REGLAN) 10 MG tablet  Every 6 hours PRN     Discontinue  Reprint     04/07/20 0932    diphenhydrAMINE (BENADRYL) 25 mg capsule  Every 6 hours PRN     Discontinue  Reprint     04/07/20 0932    naproxen (NAPROSYN) 500 MG tablet  2 times daily with meals     Discontinue  Reprint     04/07/20 0932          Portions of this note were generated with dragon dictation software. Dictation errors may occur despite best attempts at proofreading.   Carrie Mew, MD 04/07/20 1115

## 2020-04-07 NOTE — ED Notes (Signed)
E-signature not working at this time. Pt verbalized understanding of D/C instructions, prescriptions and follow up care with no further questions at this time. Pt in NAD and ambulatory at time of D/C.  

## 2020-04-07 NOTE — ED Notes (Signed)
Pt c/o headache to forehead and right posterior head that came on all of a sudden.  + vomiting, hx of migraines but this is different.

## 2020-04-14 ENCOUNTER — Other Ambulatory Visit: Payer: Self-pay | Admitting: Primary Care

## 2020-04-14 DIAGNOSIS — R000 Tachycardia, unspecified: Secondary | ICD-10-CM

## 2020-04-14 DIAGNOSIS — I10 Essential (primary) hypertension: Secondary | ICD-10-CM

## 2020-05-26 ENCOUNTER — Ambulatory Visit
Admission: EM | Admit: 2020-05-26 | Discharge: 2020-05-26 | Disposition: A | Discharge status: Home / Self Care | Payer: Medicaid Other | Attending: Physician Assistant | Admitting: Physician Assistant

## 2020-05-26 ENCOUNTER — Other Ambulatory Visit: Payer: Self-pay

## 2020-05-26 DIAGNOSIS — E876 Hypokalemia: Secondary | ICD-10-CM | POA: Insufficient documentation

## 2020-05-26 DIAGNOSIS — R197 Diarrhea, unspecified: Secondary | ICD-10-CM | POA: Insufficient documentation

## 2020-05-26 DIAGNOSIS — R109 Unspecified abdominal pain: Secondary | ICD-10-CM

## 2020-05-26 LAB — URINALYSIS, COMPLETE (UACMP) WITH MICROSCOPIC
Bilirubin Urine: NEGATIVE
Glucose, UA: 100 mg/dL — AB
Ketones, ur: NEGATIVE mg/dL
Leukocytes,Ua: NEGATIVE
Nitrite: NEGATIVE
Protein, ur: NEGATIVE mg/dL
Specific Gravity, Urine: 1.025 (ref 1.005–1.030)
pH: 5.5 (ref 5.0–8.0)

## 2020-05-26 LAB — CBC WITH DIFFERENTIAL/PLATELET
Abs Immature Granulocytes: 0.05 10*3/uL (ref 0.00–0.07)
Basophils Absolute: 0.1 10*3/uL (ref 0.0–0.1)
Basophils Relative: 1 %
Eosinophils Absolute: 0.3 10*3/uL (ref 0.0–0.5)
Eosinophils Relative: 3 %
HCT: 41.8 % (ref 36.0–46.0)
Hemoglobin: 13.9 g/dL (ref 12.0–15.0)
Immature Granulocytes: 1 %
Lymphocytes Relative: 20 %
Lymphs Abs: 2 10*3/uL (ref 0.7–4.0)
MCH: 26.8 pg (ref 26.0–34.0)
MCHC: 33.3 g/dL (ref 30.0–36.0)
MCV: 80.5 fL (ref 80.0–100.0)
Monocytes Absolute: 0.6 10*3/uL (ref 0.1–1.0)
Monocytes Relative: 6 %
Neutro Abs: 6.9 10*3/uL (ref 1.7–7.7)
Neutrophils Relative %: 69 %
Platelets: 305 10*3/uL (ref 150–400)
RBC: 5.19 MIL/uL — ABNORMAL HIGH (ref 3.87–5.11)
RDW: 15.1 % (ref 11.5–15.5)
WBC: 9.9 10*3/uL (ref 4.0–10.5)
nRBC: 0 % (ref 0.0–0.2)

## 2020-05-26 LAB — COMPREHENSIVE METABOLIC PANEL
ALT: 31 U/L (ref 0–44)
AST: 46 U/L — ABNORMAL HIGH (ref 15–41)
Albumin: 4.2 g/dL (ref 3.5–5.0)
Alkaline Phosphatase: 70 U/L (ref 38–126)
Anion gap: 12 (ref 5–15)
BUN: 8 mg/dL (ref 6–20)
CO2: 25 mmol/L (ref 22–32)
Calcium: 9.4 mg/dL (ref 8.9–10.3)
Chloride: 99 mmol/L (ref 98–111)
Creatinine, Ser: 0.81 mg/dL (ref 0.44–1.00)
GFR calc Af Amer: >60 mL/min (ref >60)
GFR calc non Af Amer: >60 mL/min (ref >60)
Glucose, Bld: 220 mg/dL — ABNORMAL HIGH (ref 70–99)
Potassium: 2.9 mmol/L — ABNORMAL LOW (ref 3.5–5.1)
Sodium: 136 mmol/L (ref 135–145)
Total Bilirubin: 0.7 mg/dL (ref 0.3–1.2)
Total Protein: 7.4 g/dL (ref 6.5–8.1)

## 2020-05-26 LAB — GASTROINTESTINAL PANEL BY PCR, STOOL (REPLACES STOOL CULTURE)

## 2020-05-26 LAB — PHOSPHORUS: Phosphorus: 3.1 mg/dL (ref 2.5–4.6)

## 2020-05-26 LAB — PREGNANCY, URINE: Preg Test, Ur: NEGATIVE

## 2020-05-26 LAB — LIPASE, BLOOD: Lipase: 25 U/L (ref 11–51)

## 2020-05-26 LAB — MAGNESIUM: Magnesium: 1.6 mg/dL — ABNORMAL LOW (ref 1.7–2.4)

## 2020-05-26 MED ORDER — MAGNESIUM OXIDE 400 MG PO CAPS: 400.0000 mg | ORAL_CAPSULE | Freq: Every day | ORAL | 0 refills | Status: AC

## 2020-05-26 NOTE — ED Provider Notes (Signed)
[facility]    CSN: [id number] Arrival date & time: 05/26/20  4098      History   Chief Complaint Chief Complaint  Patient presents with   Abdominal Pain    HPI Christine Cook is a 44 y.o. female.   Patient is a 44 year old female who presents with complaint of nausea, vomiting, diarrhea, and abdominal pain since last Tuesday.  Patient denies any trips or unusual foods and cannot think of anything else that would have precipitated this.  She denies any urinary symptoms.  She reports 3 diarrhea episodes today and reports about 10/day her last several days.  Patient reports vomiting a couple times over the last week.  Patient reports some blood noted her stools but thinks it could be related to some hemorrhoids that are developed.  She reports her last menstrual period being about 1 month ago.  She has been able to keep food and liquids down.     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   Sleep apnea with hypersomnolence    CPAP study 11-28-09,  10-02-2009 AHI was 16.5, pressure to    Tachycardia     Patient Active Problem List   Diagnosis Date Noted   Screening for cervical cancer 09/11/2019   Arthralgia of right temporomandibular joint 02/15/2019   Plantar fasciitis 01/02/2018   Back pain 01/02/2018   Hypokalemia 11/29/2017   Cervical dysplasia 12/01/2016   Hyperlipidemia 08/05/2016   Tachycardia 08/05/2016   OSA (obstructive sleep apnea) 07/12/2016   Narcolepsy 07/12/2016   Vitamin B12 deficiency 07/12/2016   Vitamin D deficiency  07/12/2016   Diabetes mellitus without complication ([place]) 11/91/4782   MDD (major depressive disorder) 07/11/2016   Impulse control disorder (gambling) 07/11/2016   Hypertension 07/09/2016   GERD 01/21/2009    Past Surgical History:  Procedure Laterality Date   ADENOIDECTOMY     BLADDER SURGERY     CHOLECYSTECTOMY     KNEE SURGERY     Left knee x 2    LEEP  10/2016   CIN 1   TONSILLECTOMY      OB History   No obstetric history on file.      Home Medications    Prior to Admission medications   Medication Sig Start Date End Date Taking? Authorizing Provider  amoxicillin (AMOXIL) 500 MG capsule Take 1 capsule (500 mg total) by mouth 3 (three) times daily. 03/23/20   Jearld Fenton, NP  ARIPiprazole (ABILIFY) 10 MG tablet Take 10 mg by mouth daily.    [provider]  atorvastatin (LIPITOR) 80 MG tablet Take 1 tablet by mouth every evening for cholesterol. 03/26/19   Pleas Koch, NP  Cholecalciferol (VITAMIN D3) 2000 units TABS Take by mouth.    [provider]  Cyanocobalamin (B-12 PO) Take 1,000 mcg by mouth daily.    [provider]  cyclobenzaprine (FLEXERIL) 5 MG tablet Take 1-2 tablets (5-10 mg total) by mouth 3 (three) times daily as needed for muscle spasms (  sedation caution). 07/09/19   Tonia Ghent, MD  diphenhydrAMINE (BENADRYL) 25 mg capsule Take 2 capsules (50 mg total) by mouth every 6 (six) hours as needed. 04/07/20   Carrie Mew, MD  glipiZIDE (GLUCOTROL) 10 MG tablet Take 1 tablet (10 mg total) by mouth 2 (two) times daily before a meal. For diabetes. 03/26/19   Pleas Koch, NP  ibuprofen (ADVIL) 600 MG tablet Take 1 tablet (600 mg total) by mouth every 8 (eight) hours as needed. 03/23/20   Jearld Fenton, NP  levonorgestrel (MIRENA) 20 MCG/24HR IUD 1 each by Intrauterine route once. 01/16/14   Hassell Done, FNP  Magnesium Oxide 400 MG CAPS Take 1 capsule (400 mg total) by mouth daily for 5 days. 05/26/20  05/31/20  Luvenia Redden, PA-C  metFORMIN (GLUCOPHAGE-XR) 500 MG 24 hr tablet Take 500 mg by mouth daily with breakfast.    [provider]  metoCLOPramide (REGLAN) 10 MG tablet Take 1 tablet (10 mg total) by mouth every 6 (six) hours as needed. 04/07/20   Carrie Mew, MD  metoprolol tartrate (LOPRESSOR) 25 MG tablet Take 1 tablet by mouth twice daily 04/15/20   Pleas Koch, NP  naproxen (NAPROSYN) 500 MG tablet Take 1 tablet (500 mg total) by mouth 2 (two) times daily with a meal. 04/07/20   Carrie Mew, MD  ondansetron (ZOFRAN ODT) 4 MG disintegrating tablet Take 1 tablet (4 mg total) by mouth every 8 (eight) hours as needed for nausea or vomiting. 02/22/19   Pleas Koch, NP  pantoprazole (PROTONIX) 40 MG tablet Take 1 tablet by mouth once daily 03/20/20   Pleas Koch, NP  potassium chloride SA (K-DUR) 20 MEQ tablet Take 1 tablet by mouth once daily 04/12/19   Pleas Koch, NP  QUEtiapine (SEROQUEL XR) 50 MG TB24 24 hr tablet Take 1 tablet (50 mg total) by mouth at bedtime. 07/09/19   Tonia Ghent, MD  venlafaxine (EFFEXOR) 75 MG tablet Take 150 mg by mouth daily.    [provider]    Family History Family History  Problem Relation Age of Onset   Asthma Mother    Hyperthyroidism Mother    Rheum arthritis Mother    Graves' disease Mother    Sjogren's syndrome Mother    Migraines Mother    Asthma Daughter    Migraines Daughter    Colon cancer Neg Hx     Social History Social History   Tobacco Use   Smoking status: Former Smoker    Packs/day: 0.25    Years: 2.00    Pack years: 0.50    Types: Cigarettes    Quit date: 2017    Years since quitting: 4.6   Smokeless tobacco: Never Used  Scientific laboratory technician Use: Never used  Substance Use Topics   Alcohol use: No   Drug use: No     Allergies   Codeine, Morphine, and Neurontin [gabapentin]   Review of Systems Review of Systems as noted above in HPI.  Other  system reviewed and found to be negative   Physical Exam Triage Vital Signs ED Triage Vitals  Enc Vitals Group     BP 05/26/20 0853 139/86     Pulse Rate 05/26/20 0853 80     Resp 05/26/20 0853 16     Temp 05/26/20 0853 98.2 F (36.8 C)     Temp src --      SpO2 05/26/20 0853 100 %  Weight 05/26/20 0854 170 lb (77.1 kg)     Height 05/26/20 0854 5\' 5"  (1.651 m)     Head Circumference --      Peak Flow --      Pain Score 05/26/20 0853 5     Pain Loc --      Pain Edu? --      Excl. in [place]? --    No data found.  Updated Vital Signs BP 139/86    Pulse 80    Temp 98.2 F (36.8 C)    Resp 16    Ht 5\' 5"  (1.651 m)    Wt 170 lb (77.1 kg)    SpO2 100%    BMI 28.29 kg/m   Physical Exam Constitutional:      Appearance: She is well-developed. She is not ill-appearing.  Cardiovascular:     Rate and Rhythm: Normal rate and regular rhythm.     Heart sounds: Normal heart sounds.  Pulmonary:     Effort: Pulmonary effort is normal. No respiratory distress.     Breath sounds: Normal breath sounds.  Abdominal:     General: Abdomen is flat. Bowel sounds are decreased.     Palpations: Abdomen is soft.     Tenderness: There is abdominal tenderness in the left upper quadrant. There is guarding. There is no rebound.  Neurological:     Mental Status: She is alert.      UC Treatments / Results  Labs (all labs ordered are listed, but only abnormal results are displayed) Labs Reviewed  URINALYSIS, COMPLETE (UACMP) WITH MICROSCOPIC - Abnormal; Notable for the following components:      Result Value   Color, Urine AMBER (*)    APPearance HAZY (*)    Glucose, UA 100 (*)    Hgb urine dipstick TRACE (*)    Bacteria, UA FEW (*)    All other components within normal limits  COMPREHENSIVE METABOLIC PANEL - Abnormal; Notable for the following components:   Potassium 2.9 (*)    Glucose, Bld 220 (*)    AST 46 (*)    All other components within normal limits  CBC WITH DIFFERENTIAL/PLATELET  - Abnormal; Notable for the following components:   RBC 5.19 (*)    All other components within normal limits  MAGNESIUM - Abnormal; Notable for the following components:   Magnesium 1.6 (*)    All other components within normal limits  GASTROINTESTINAL PANEL BY PCR, STOOL (REPLACES STOOL CULTURE)  LIPASE, BLOOD  PREGNANCY, URINE  PHOSPHORUS    EKG   Radiology No results found.  Procedures Procedures (including critical care time)  Medications Ordered in UC Medications - No data to display  Initial Impression / Assessment and Plan / UC Course  I have reviewed the triage vital signs and the nursing notes.  Pertinent labs & imaging results that were available during my care of the patient were reviewed by me and considered in my medical decision making (see chart for details).     Patient reports with 1 week of nausea vomiting and diarrhea.  Patient reports up to 10 episodes of diarrhea daily.  Lab work with hypomagnesia and hypokalemia.  Lipase is normal.  Renal function is in normal limits.  Urinalysis is hazy and amber.  We will send the urine for culture but not treat as of now as she only has a few bacteria.  Pregnancy test negative. Patient discussed with Dr. Lanny Cramp.  We will give prescription for Mag-Ox and recommend  patient to push fluids with electrolyte solutions.  Have her follow-up if symptoms not getting better.  Advised her that she can come in in 5 to 7 days for repeat of her labs.  Final Clinical Impressions(s) / UC Diagnoses   Final diagnoses:  Abdominal pain, unspecified abdominal location  Diarrhea, unspecified type  Hypomagnesemia  Hypokalemia     Discharge Instructions     -magnesium: 1 tablet daily for 5 days -Gatorade or other electrolyte drink: push fluid intake. If drink contains sugar, mix with some water so extra sugar does not upset your stomach -can return to clinic in 5-7 days to recheck labs if desired -We will call with results once the  GI panel is complete.    ED Prescriptions    Medication Sig Dispense Auth. Provider   Magnesium Oxide 400 MG CAPS Take 1 capsule (400 mg total) by mouth daily for 5 days. 5 capsule Luvenia Redden, PA-C     PDMP not reviewed this encounter.   Luvenia Redden, PA-C 05/26/20 1041

## 2020-05-26 NOTE — ED Triage Notes (Signed)
Pt reports having abd pain, n/v/d since last Tuesday. No other symptoms at this time.

## 2020-05-26 NOTE — Discharge Instructions (Signed)
-  magnesium: 1 tablet daily for 5 days -Gatorade or other electrolyte drink: push fluid intake. If drink contains sugar, mix with some water so extra sugar does not upset your stomach -can return to clinic in 5-7 days to recheck labs if desired -We will call with results once the GI panel is complete.

## 2020-05-27 ENCOUNTER — Encounter: Payer: Self-pay | Admitting: Emergency Medicine

## 2020-05-27 ENCOUNTER — Emergency Department
Admission: EM | Admit: 2020-05-27 | Discharge: 2020-05-27 | Disposition: A | Discharge status: Home / Self Care | Payer: Self-pay | Attending: Emergency Medicine | Admitting: Emergency Medicine

## 2020-05-27 ENCOUNTER — Other Ambulatory Visit: Payer: Self-pay

## 2020-05-27 ENCOUNTER — Emergency Department: Payer: Self-pay

## 2020-05-27 DIAGNOSIS — I10 Essential (primary) hypertension: Secondary | ICD-10-CM | POA: Insufficient documentation

## 2020-05-27 DIAGNOSIS — R197 Diarrhea, unspecified: Secondary | ICD-10-CM | POA: Insufficient documentation

## 2020-05-27 DIAGNOSIS — R109 Unspecified abdominal pain: Secondary | ICD-10-CM

## 2020-05-27 DIAGNOSIS — E119 Type 2 diabetes mellitus without complications: Secondary | ICD-10-CM | POA: Insufficient documentation

## 2020-05-27 DIAGNOSIS — E860 Dehydration: Secondary | ICD-10-CM

## 2020-05-27 DIAGNOSIS — Z87891 Personal history of nicotine dependence: Secondary | ICD-10-CM | POA: Insufficient documentation

## 2020-05-27 DIAGNOSIS — E876 Hypokalemia: Secondary | ICD-10-CM

## 2020-05-27 DIAGNOSIS — R112 Nausea with vomiting, unspecified: Secondary | ICD-10-CM | POA: Insufficient documentation

## 2020-05-27 DIAGNOSIS — Z7984 Long term (current) use of oral hypoglycemic drugs: Secondary | ICD-10-CM | POA: Insufficient documentation

## 2020-05-27 DIAGNOSIS — Z79899 Other long term (current) drug therapy: Secondary | ICD-10-CM | POA: Insufficient documentation

## 2020-05-27 DIAGNOSIS — R739 Hyperglycemia, unspecified: Secondary | ICD-10-CM

## 2020-05-27 DIAGNOSIS — Z20822 Contact with and (suspected) exposure to covid-19: Secondary | ICD-10-CM | POA: Insufficient documentation

## 2020-05-27 DIAGNOSIS — K219 Gastro-esophageal reflux disease without esophagitis: Secondary | ICD-10-CM | POA: Insufficient documentation

## 2020-05-27 DIAGNOSIS — Z124 Encounter for screening for malignant neoplasm of cervix: Secondary | ICD-10-CM | POA: Insufficient documentation

## 2020-05-27 LAB — BASIC METABOLIC PANEL
Anion gap: 12 (ref 5–15)
BUN: 7 mg/dL (ref 6–20)
CO2: 25 mmol/L (ref 22–32)
Calcium: 8.5 mg/dL — ABNORMAL LOW (ref 8.9–10.3)
Chloride: 100 mmol/L (ref 98–111)
Creatinine, Ser: 0.71 mg/dL (ref 0.44–1.00)
GFR calc Af Amer: >60 mL/min (ref >60)
GFR calc non Af Amer: >60 mL/min (ref >60)
Glucose, Bld: 127 mg/dL — ABNORMAL HIGH (ref 70–99)
Potassium: 3.4 mmol/L — ABNORMAL LOW (ref 3.5–5.1)
Sodium: 137 mmol/L (ref 135–145)

## 2020-05-27 LAB — URINALYSIS, COMPLETE (UACMP) WITH MICROSCOPIC
Bilirubin Urine: NEGATIVE
Glucose, UA: 150 mg/dL — AB
Hgb urine dipstick: NEGATIVE
Ketones, ur: NEGATIVE mg/dL
Leukocytes,Ua: NEGATIVE
Nitrite: NEGATIVE
Protein, ur: NEGATIVE mg/dL
Specific Gravity, Urine: 1.023 (ref 1.005–1.030)
pH: 5 (ref 5.0–8.0)

## 2020-05-27 LAB — CBC
HCT: 40.7 % (ref 36.0–46.0)
Hemoglobin: 13.2 g/dL (ref 12.0–15.0)
MCH: 26.6 pg (ref 26.0–34.0)
MCHC: 32.4 g/dL (ref 30.0–36.0)
MCV: 81.9 fL (ref 80.0–100.0)
Platelets: 277 10*3/uL (ref 150–400)
RBC: 4.97 MIL/uL (ref 3.87–5.11)
RDW: 14.7 % (ref 11.5–15.5)
WBC: 10 10*3/uL (ref 4.0–10.5)
nRBC: 0 % (ref 0.0–0.2)

## 2020-05-27 LAB — COMPREHENSIVE METABOLIC PANEL
ALT: 27 U/L (ref 0–44)
AST: 40 U/L (ref 15–41)
Albumin: 4 g/dL (ref 3.5–5.0)
Alkaline Phosphatase: 72 U/L (ref 38–126)
Anion gap: 15 (ref 5–15)
BUN: 10 mg/dL (ref 6–20)
CO2: 25 mmol/L (ref 22–32)
Calcium: 9.4 mg/dL (ref 8.9–10.3)
Chloride: 97 mmol/L — ABNORMAL LOW (ref 98–111)
Creatinine, Ser: 0.86 mg/dL (ref 0.44–1.00)
GFR calc Af Amer: >60 mL/min (ref >60)
GFR calc non Af Amer: >60 mL/min (ref >60)
Glucose, Bld: 249 mg/dL — ABNORMAL HIGH (ref 70–99)
Potassium: 2.6 mmol/L — CL (ref 3.5–5.1)
Sodium: 137 mmol/L (ref 135–145)
Total Bilirubin: 0.7 mg/dL (ref 0.3–1.2)
Total Protein: 7.3 g/dL (ref 6.5–8.1)

## 2020-05-27 LAB — C DIFFICILE QUICK SCREEN W PCR REFLEX
C Diff antigen: NEGATIVE
C Diff interpretation: NOT DETECTED
C Diff toxin: NEGATIVE

## 2020-05-27 LAB — SARS CORONAVIRUS 2 BY RT PCR (HOSPITAL ORDER, PERFORMED IN ~~LOC~~ HOSPITAL LAB): SARS Coronavirus 2: NEGATIVE

## 2020-05-27 LAB — LIPASE, BLOOD: Lipase: 28 U/L (ref 11–51)

## 2020-05-27 LAB — MAGNESIUM: Magnesium: 2.1 mg/dL (ref 1.7–2.4)

## 2020-05-27 MED ORDER — IOHEXOL 9 MG/ML PO SOLN
1000.0000 mL | ORAL | Status: AC
  Administered 2020-05-27: 1000 mL via ORAL

## 2020-05-27 MED ORDER — POTASSIUM CHLORIDE 20 MEQ/15ML (10%) PO SOLN
40.0000 meq | Freq: Once | ORAL | Status: AC
  Administered 2020-05-27: 40 meq via ORAL
  Filled 2020-05-27: qty 30

## 2020-05-27 MED ORDER — ONDANSETRON HCL 4 MG PO TABS: 4.0000 mg | ORAL_TABLET | Freq: Three times a day (TID) | ORAL | 0 refills | Status: DC | PRN

## 2020-05-27 MED ORDER — MAGNESIUM SULFATE 2 GM/50ML IV SOLN
2.0000 g | Freq: Once | INTRAVENOUS | Status: AC
  Administered 2020-05-27: 2 g via INTRAVENOUS
  Filled 2020-05-27: qty 50

## 2020-05-27 MED ORDER — ONDANSETRON HCL 4 MG/2ML IJ SOLN
4.0000 mg | Freq: Once | INTRAMUSCULAR | Status: AC
  Administered 2020-05-27: 4 mg via INTRAVENOUS
  Filled 2020-05-27: qty 2

## 2020-05-27 MED ORDER — ONDANSETRON 4 MG PO TBDP
4.0000 mg | ORAL_TABLET | Freq: Once | ORAL | Status: AC | PRN
  Administered 2020-05-27: 4 mg via ORAL
  Filled 2020-05-27: qty 1

## 2020-05-27 MED ORDER — LACTATED RINGERS IV BOLUS
1000.0000 mL | Freq: Once | INTRAVENOUS | Status: AC
  Administered 2020-05-27: 1000 mL via INTRAVENOUS

## 2020-05-27 MED ORDER — SODIUM CHLORIDE 0.9 % IV BOLUS
1000.0000 mL | Freq: Once | INTRAVENOUS | Status: AC
  Administered 2020-05-27: 1000 mL via INTRAVENOUS

## 2020-05-27 MED ORDER — DICYCLOMINE HCL 10 MG PO CAPS: 10.0000 mg | ORAL_CAPSULE | Freq: Four times a day (QID) | ORAL | 0 refills | Status: DC

## 2020-05-27 MED ORDER — PROMETHAZINE HCL 25 MG/ML IJ SOLN
12.5000 mg | Freq: Once | INTRAMUSCULAR | Status: AC
  Administered 2020-05-27: 12.5 mg via INTRAVENOUS
  Filled 2020-05-27: qty 1

## 2020-05-27 MED ORDER — IOHEXOL 300 MG/ML  SOLN
100.0000 mL | Freq: Once | INTRAMUSCULAR | Status: AC | PRN
  Administered 2020-05-27: 100 mL via INTRAVENOUS

## 2020-05-27 NOTE — ED Triage Notes (Signed)
Pt reports NVD and abd pain since last Tuesday.

## 2020-05-27 NOTE — ED Notes (Signed)
Patient transported to CT 

## 2020-05-27 NOTE — ED Provider Notes (Signed)
[facility] Emergency Department Provider Note   ____________________________________________   First MD Initiated Contact with Patient 05/27/20 1031     (approximate)  I have reviewed the triage vital signs and the nursing notes.   HISTORY  Chief Complaint Abdominal Pain, Emesis, and Diarrhea    HPI Christine Cook is a 44 y.o. female who complains of 10 days of abdominal achiness with vomiting and diarrhea.  She reports she is going about 10 times a day with just loose stools.  Yesterday she noted some blood in her stools but think it is from her hemorrhoids.  She was noted to have a slightly low potassium and magnesium yesterday.  Today her potassium is lower still.  Patient reports nothing really makes her belly pain better or worse.  Again is just achy in nature fairly diffuse.  Is been going on for days.  Patient is not short of breath.  She is not coughing.  She does not run a fever.  She has not lost her  sense of taste or smell.         Past Medical History:  Diagnosis Date  . Anxiety   . Benzodiazepine overdose   . Depression   . Diabetes ([place])   . Fatty liver disease, nonalcoholic   . Heart murmur   . Heart palpitations   . High cholesterol   . 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 Hypertension   . Interstitial cystitis   . Irritable bowel syndrome with diarrhea   . 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 Migraine   . 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 Sleep apnea with hypersomnolence    CPAP study 11-28-09,  10-02-2009 AHI was 16.5, pressure to   . Tachycardia     Patient Active Problem List   Diagnosis Date Noted  . Screening for cervical cancer 09/11/2019  . Arthralgia of right temporomandibular joint 02/15/2019  . Plantar fasciitis 01/02/2018  . Back pain 01/02/2018  . Hypokalemia 11/29/2017  . Cervical dysplasia 12/01/2016  . Hyperlipidemia 08/05/2016  . Tachycardia 08/05/2016  . OSA  (obstructive sleep apnea) 07/12/2016  . 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 Vitamin B12 deficiency 07/12/2016  . Vitamin D deficiency 07/12/2016  . Diabetes mellitus without complication ([place]) 17/00/1749  . 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 Impulse control disorder (gambling) 07/11/2016  . Hypertension 07/09/2016  . GERD 01/21/2009    Past Surgical History:  Procedure Laterality Date  . ADENOIDECTOMY    . BLADDER SURGERY    . CHOLECYSTECTOMY    . KNEE SURGERY     Left knee x 2   . LEEP  10/2016   CIN 1  . TONSILLECTOMY      Prior to Admission medications   Medication Sig Start Date End Date Taking? Authorizing Provider  amoxicillin (AMOXIL) 500 MG capsule Take 1 capsule (500 mg total) by mouth 3 (three) times daily. 03/23/20   Jearld Fenton, NP  ARIPiprazole (ABILIFY) 10 MG tablet Take 10 mg by mouth daily.    [provider]  atorvastatin (LIPITOR) 80 MG tablet Take 1 tablet by mouth every evening for cholesterol. 03/26/19   Pleas Koch, NP  Cholecalciferol (VITAMIN D3) 2000 units TABS Take by mouth.    [provider]  Cyanocobalamin (B-12 PO) Take 1,000 mcg by mouth daily.    [provider]  cyclobenzaprine (FLEXERIL) 5 MG tablet Take 1-2 tablets (5-10 mg total) by mouth 3 (three)  times daily as needed for muscle spasms (sedation caution). 07/09/19   Tonia Ghent, MD  diphenhydrAMINE (BENADRYL) 25 mg capsule Take 2 capsules (50 mg total) by mouth every 6 (six) hours as needed. 04/07/20   Carrie Mew, MD  glipiZIDE (GLUCOTROL) 10 MG tablet Take 1 tablet (10 mg total) by mouth 2 (two) times daily before a meal. For diabetes. 03/26/19   Pleas Koch, NP  ibuprofen (ADVIL) 600 MG tablet Take 1 tablet (600 mg total) by mouth every 8 (eight) hours as needed. 03/23/20   Jearld Fenton, NP  levonorgestrel (MIRENA) 20 MCG/24HR IUD 1 each by Intrauterine route once. 01/16/14   Hassell Done, FNP  Magnesium Oxide 400 MG CAPS Take 1 capsule  (400 mg total) by mouth daily for 5 days. 05/26/20 05/31/20  Luvenia Redden, PA-C  metFORMIN (GLUCOPHAGE-XR) 500 MG 24 hr tablet Take 500 mg by mouth daily with breakfast.    [provider]  metoCLOPramide (REGLAN) 10 MG tablet Take 1 tablet (10 mg total) by mouth every 6 (six) hours as needed. 04/07/20   Carrie Mew, MD  metoprolol tartrate (LOPRESSOR) 25 MG tablet Take 1 tablet by mouth twice daily 04/15/20   Pleas Koch, NP  naproxen (NAPROSYN) 500 MG tablet Take 1 tablet (500 mg total) by mouth 2 (two) times daily with a meal. 04/07/20   Carrie Mew, MD  ondansetron (ZOFRAN ODT) 4 MG disintegrating tablet Take 1 tablet (4 mg total) by mouth every 8 (eight) hours as needed for nausea or vomiting. 02/22/19   Pleas Koch, NP  pantoprazole (PROTONIX) 40 MG tablet Take 1 tablet by mouth once daily 03/20/20   Pleas Koch, NP  potassium chloride SA (K-DUR) 20 MEQ tablet Take 1 tablet by mouth once daily 04/12/19   Pleas Koch, NP  QUEtiapine (SEROQUEL XR) 50 MG TB24 24 hr tablet Take 1 tablet (50 mg total) by mouth at bedtime. 07/09/19   Tonia Ghent, MD  venlafaxine (EFFEXOR) 75 MG tablet Take 150 mg by mouth daily.    [provider]    Allergies Codeine, Morphine, and Neurontin [gabapentin]  Family History  Problem Relation Age of Onset  . Asthma Mother   . Hyperthyroidism Mother   . Rheum arthritis Mother   . Graves' disease Mother   . Sjogren's syndrome Mother   . Migraines Mother   . Asthma Daughter   . Migraines Daughter   . Colon cancer Neg Hx     Social History Social History   Tobacco Use  . Smoking status: Former Smoker    Packs/day: 0.25    Years: 2.00    Pack years: 0.50    Types: Cigarettes    Quit date: 2017    Years since quitting: 4.6  . Smokeless tobacco: Never Used  Vaping Use  . Vaping Use: Never used  Substance Use Topics  . Alcohol use: No  . Drug use: No    Review of Systems  Constitutional: No  fever/chills Eyes: No visual changes. ENT: No sore throat. Cardiovascular: Denies chest pain. Respiratory: Denies shortness of breath. Gastrointestinal:  abdominal pain. nausea, vomiting.   diarrhea.  No constipation. Genitourinary: Negative for dysuria. Musculoskeletal: Negative for back pain. Skin: Negative for rash. Neurological: Negative for headaches, focal weakness   ____________________________________________   PHYSICAL EXAM:  VITAL SIGNS: ED Triage Vitals  Enc Vitals Group     BP 05/27/20 0735 138/90     Pulse Rate 05/27/20 0735 86  Resp 05/27/20 0735 18     Temp 05/27/20 0735 98.6 F (37 C)     Temp Source 05/27/20 0735 Oral     SpO2 05/27/20 0735 97 %     Weight 05/27/20 0733 170 lb (77.1 kg)     Height 05/27/20 0733 5\' 5"  (1.651 m)     Head Circumference --      Peak Flow --      Pain Score 05/27/20 0733 5     Pain Loc --      Pain Edu? --      Excl. in [place]? --     Constitutional: Alert and oriented. Well appearing and in no acute distress. Eyes: Conjunctivae are normal.  Head: Atraumatic. Nose: No congestion/rhinnorhea. Mouth/Throat: Mucous membranes are moist.  Oropharynx non-erythematous. Neck: No stridor.  Cardiovascular: Normal rate, regular rhythm. Grossly normal heart sounds.  Good peripheral circulation. Respiratory: Normal respiratory effort.  No retractions. Lungs CTAB. Gastrointestinal: Soft mildly diffusely tender worst tenderness in the right upper quadrant.  Patient reports her gallbladder is out.  No distention. No abdominal bruits.  Musculoskeletal: No lower extremity tenderness nor edema.  No joint effusions. Neurologic:  Normal speech and language. No gross focal neurologic deficits are appreciated.  Skin:  Skin is warm, dry and intact. No rash noted.  ____________________________________________   LABS (all labs ordered are listed, but only abnormal results are displayed)  Labs Reviewed  COMPREHENSIVE METABOLIC PANEL - Abnormal;  Notable for the following components:      Result Value   Potassium 2.6 (*)    Chloride 97 (*)    Glucose, Bld 249 (*)    All other components within normal limits  URINALYSIS, COMPLETE (UACMP) WITH MICROSCOPIC - Abnormal; Notable for the following components:   Color, Urine YELLOW (*)    APPearance CLOUDY (*)    Glucose, UA 150 (*)    Bacteria, UA RARE (*)    All other components within normal limits  C DIFFICILE QUICK SCREEN W PCR REFLEX  SARS CORONAVIRUS 2 BY RT PCR (HOSPITAL ORDER, [facility])  LIPASE, BLOOD  CBC  BASIC METABOLIC PANEL  MAGNESIUM   ____________________________________________  EKG   ____________________________________________  RADIOLOGY  ED MD interpretation CT read by radiology reviewed by me shows no obvious intracranial pathology as radiologist remarked there is an IUD in the uterus.  Official radiology report(s): CT ABDOMEN PELVIS W CONTRAST  Result Date: 05/27/2020 CLINICAL DATA:  Abdominal pain with nausea, vomiting, diarrhea for the past week. EXAM: CT ABDOMEN AND PELVIS WITH CONTRAST TECHNIQUE: Multidetector CT imaging of the abdomen and pelvis was performed using the standard protocol following bolus administration of intravenous contrast. CONTRAST:  164mL OMNIPAQUE IOHEXOL 300 MG/ML  SOLN COMPARISON:  CT abdomen pelvis dated June 04, 2017. FINDINGS: Lower chest: No acute abnormality. Hepatobiliary: No focal liver abnormality is seen. Status post cholecystectomy. No biliary dilatation. Pancreas: Unremarkable. No pancreatic ductal dilatation or surrounding inflammatory changes. Spleen: Normal in size.  Multiple calcified granulomas again noted. Adrenals/Urinary Tract: Adrenal glands are unremarkable. Kidneys are normal, without renal calculi, focal lesion, or hydronephrosis. Bladder is unremarkable. Stomach/Bowel: Stomach is within normal limits. Appendix appears normal. No evidence of bowel wall thickening, distention,  or inflammatory changes. Vascular/Lymphatic: No significant vascular findings are present. No enlarged abdominal or pelvic lymph nodes. Reproductive: Uterus and bilateral adnexa are unremarkable. IUD within the endometrial canal. Other: No abdominal wall hernia or abnormality. No abdominopelvic ascites. No pneumoperitoneum. Musculoskeletal: No acute or  significant osseous findings. IMPRESSION: 1. No acute intra-abdominal process. Electronically Signed   By: Titus Dubin M.D.   On: 05/27/2020 14:14    ____________________________________________   PROCEDURES  Procedure(s) performed (including Critical Care):  Procedures   ____________________________________________   INITIAL IMPRESSION / ASSESSMENT AND PLAN / ED COURSE ----------------------------------------- 3:03 PM on 05/27/2020 -----------------------------------------   Patient doing well so far.  Is drinking.  She still nauseated.  She has just had a stool.  This is her first 1.  We set the C. difficile off.  We will get a BMP and a magnesium as well.  I am signing this out to the oncoming physician.              ____________________________________________   FINAL CLINICAL IMPRESSION(S) / ED DIAGNOSES  Final diagnoses:  Nausea vomiting and diarrhea  Abdominal pain, unspecified abdominal location     ED Discharge Orders    None       Note:  This document was prepared using Dragon voice recognition software and may include unintentional dictation errors.    Nena Polio, MD 05/27/20 [phone]

## 2020-05-27 NOTE — ED Notes (Signed)
Pt continues to work on drinking contrast; is close to finishing first bottle

## 2020-05-27 NOTE — ED Provider Notes (Addendum)
I assumed care of this patient from outgoing provider at approximately 1500.  Please see Dr. Marjory Lies note for full details regarding patient's initial evaluation and work-up.  In brief patient presents with several days of nausea, vomiting, diarrhea, and abdominal pain.  ED work-up remarkable for hypokalemia.  Potassium was repleted and had improved on repeat check of electrolytes.  Patient received below noted medications.  GI pathogen panel sent yesterday unremarkable.  Covid and C. difficile panel sent today.  My reassessment patient states she feels better and is hemodynamically stable.  Presentation and work-up is most consistent with viral gastroenteritis versus C. difficile.  We will plan to discharge patient and follow-up her C. difficile to see if she will require treatment with vancomycin.  I discussed appropriate isolation precautions and instructions to follow-up with her PCP in the next 1 to 2 days.  Discharged stable condition.  Strict precautions advised and discussed.  Medications  iohexol (OMNIPAQUE) 9 MG/ML oral solution 1,000 mL ( Oral Canceled Entry 05/27/20 1522)  ondansetron (ZOFRAN-ODT) disintegrating tablet 4 mg (4 mg Oral Given 05/27/20 0737)  magnesium sulfate IVPB 2 g 50 mL (0 g Intravenous Stopped 05/27/20 1145)  ondansetron (ZOFRAN) injection 4 mg (4 mg Intravenous Given 05/27/20 1113)  sodium chloride 0.9 % bolus 1,000 mL (0 mLs Intravenous Stopped 05/27/20 1400)  potassium chloride 20 MEQ/15ML (10%) solution 40 mEq (40 mEq Oral Given 05/27/20 1128)  potassium chloride 20 MEQ/15ML (10%) solution 40 mEq (40 mEq Oral Given 05/27/20 1403)  iohexol (OMNIPAQUE) 300 MG/ML solution 100 mL (100 mLs Intravenous Contrast Given 05/27/20 1342)  promethazine (PHENERGAN) injection 12.5 mg (12.5 mg Intravenous Given 05/27/20 1515)  lactated ringers bolus 1,000 mL (1,000 mLs Intravenous New Bag/Given 05/27/20 1509)      Lucrezia Starch, MD 05/27/20 1551    Lucrezia Starch, MD 05/27/20  [phone]

## 2020-05-28 LAB — URINE CULTURE

## 2020-07-03 ENCOUNTER — Other Ambulatory Visit: Payer: Self-pay | Admitting: Primary Care

## 2020-07-03 DIAGNOSIS — K219 Gastro-esophageal reflux disease without esophagitis: Secondary | ICD-10-CM

## 2020-08-06 ENCOUNTER — Ambulatory Visit
Admission: EM | Admit: 2020-08-06 | Discharge: 2020-08-06 | Disposition: A | Discharge status: Home / Self Care | Payer: Self-pay | Attending: Family Medicine | Admitting: Family Medicine

## 2020-08-06 ENCOUNTER — Other Ambulatory Visit: Payer: Self-pay

## 2020-08-06 ENCOUNTER — Encounter: Payer: Self-pay | Admitting: Emergency Medicine

## 2020-08-06 DIAGNOSIS — K047 Periapical abscess without sinus: Secondary | ICD-10-CM

## 2020-08-06 MED ORDER — AMOXICILLIN-POT CLAVULANATE 875-125 MG PO TABS: 1.0000 | ORAL_TABLET | Freq: Two times a day (BID) | ORAL | 0 refills | Status: DC

## 2020-08-06 MED ORDER — HYDROCODONE-ACETAMINOPHEN 5-325 MG PO TABS
1.0000 | ORAL_TABLET | Freq: Three times a day (TID) | ORAL | 0 refills | Status: DC | PRN
Start: 2020-08-06 — End: 2020-10-03

## 2020-08-06 NOTE — Discharge Instructions (Signed)
Medication as prescribed.  See local dentist as soon as you can.  Take care  Dr. Lacinda Axon

## 2020-08-06 NOTE — ED Provider Notes (Signed)
[facility]    CSN: [id number] Arrival date & time: 08/06/20  1035      History   Chief Complaint Chief Complaint  Patient presents with  . Dental Pain   HPI   44 year old female presents with dental pain.  Patient reports that she has an abscess at the gum of her right lower dentition.  Has been ongoing for the past few days.  Worsening.  Pain 7/10 in severity.  No relieving factors.  Has not seen a dentist.  No other complaints at this time.  Past Medical History:  Diagnosis Date  . Anxiety   . Benzodiazepine overdose   . Depression   . Diabetes ([place])   . Fatty liver disease, nonalcoholic   . Heart murmur   . Heart palpitations   . High cholesterol   . 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 Hypertension   . Interstitial cystitis   . Irritable bowel syndrome with diarrhea   . 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 Migraine   . 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 Sleep apnea with hypersomnolence    CPAP study 11-28-09,  10-02-2009 AHI was 16.5, pressure to   . Tachycardia     Patient Active Problem List   Diagnosis Date Noted  . Screening for cervical cancer 09/11/2019  . Arthralgia of right temporomandibular joint 02/15/2019  . Plantar fasciitis 01/02/2018  . Back pain 01/02/2018  . Hypokalemia 11/29/2017  . Cervical dysplasia 12/01/2016  . Hyperlipidemia 08/05/2016  . Tachycardia 08/05/2016  . OSA (obstructive sleep apnea) 07/12/2016  . 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 Vitamin B12 deficiency 07/12/2016  . Vitamin D deficiency 07/12/2016  . Diabetes mellitus without complication ([place]) 69/62/9528  . 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 Impulse control disorder (gambling) 07/11/2016  . Hypertension 07/09/2016  . GERD 01/21/2009    Past Surgical History:  Procedure Laterality Date  . ADENOIDECTOMY    . BLADDER SURGERY    . CHOLECYSTECTOMY    . KNEE SURGERY     Left knee x 2   . LEEP  10/2016   CIN 1  .  TONSILLECTOMY      OB History   No obstetric history on file.      Home Medications    Prior to Admission medications   Medication Sig Start Date End Date Taking? Authorizing Provider  ARIPiprazole (ABILIFY) 10 MG tablet Take 10 mg by mouth daily.   Yes [provider]  Cholecalciferol (VITAMIN D3) 2000 units TABS Take by mouth.   Yes [provider]  Cyanocobalamin (B-12 PO) Take 1,000 mcg by mouth daily.   Yes [provider]  levonorgestrel (MIRENA) 20 MCG/24HR IUD 1 each by Intrauterine route once. 01/16/14  Yes Hassell Done, FNP  metoprolol tartrate (LOPRESSOR) 25 MG tablet Take 1 tablet by mouth twice daily 04/15/20  Yes Pleas Koch, NP  pantoprazole (PROTONIX) 40 MG tablet Take 1 tablet by mouth once daily 07/04/20  Yes Pleas Koch, NP  venlafaxine (EFFEXOR) 75 MG tablet Take 150 mg by mouth daily.   Yes [provider]  atorvastatin (LIPITOR) 80 MG tablet Take 1 tablet by mouth every evening for cholesterol. 03/26/19 08/06/20 Yes Pleas Koch, NP  amoxicillin-clavulanate (AUGMENTIN) 875-125 MG tablet Take 1 tablet by mouth 2 (two) times daily. 08/06/20   Coral Spikes, DO  dicyclomine (BENTYL) 10 MG capsule Take 1 capsule (10 mg total) by mouth 4 (four) times daily for  14 days. 05/27/20 06/10/20  Lucrezia Starch, MD  HYDROcodone-acetaminophen (NORCO/VICODIN) 5-325 MG tablet Take 1 tablet by mouth every 8 (eight) hours as needed. 08/06/20   Coral Spikes, DO  diphenhydrAMINE (BENADRYL) 25 mg capsule Take 2 capsules (50 mg total) by mouth every 6 (six) hours as needed. 04/07/20 08/06/20  Carrie Mew, MD  glipiZIDE (GLUCOTROL) 10 MG tablet Take 1 tablet (10 mg total) by mouth 2 (two) times daily before a meal. For diabetes. 03/26/19 08/06/20  Pleas Koch, NP  metoCLOPramide (REGLAN) 10 MG tablet Take 1 tablet (10 mg total) by mouth every 6 (six) hours as needed. 04/07/20 08/06/20  Carrie Mew, MD  potassium chloride SA  (K-DUR) 20 MEQ tablet Take 1 tablet by mouth once daily 04/12/19 08/06/20  Pleas Koch, NP  QUEtiapine (SEROQUEL XR) 50 MG TB24 24 hr tablet Take 1 tablet (50 mg total) by mouth at bedtime. 07/09/19 08/06/20  Tonia Ghent, MD    Family History Family History  Problem Relation Age of Onset  . Asthma Mother   . Hyperthyroidism Mother   . Rheum arthritis Mother   . Graves' disease Mother   . Sjogren's syndrome Mother   . Migraines Mother   . Asthma Daughter   . Migraines Daughter   . Colon cancer Neg Hx     Social History Social History   Tobacco Use  . Smoking status: Former Smoker    Packs/day: 0.25    Years: 2.00    Pack years: 0.50    Types: Cigarettes    Quit date: 2017    Years since quitting: 4.8  . Smokeless tobacco: Never Used  Vaping Use  . Vaping Use: Never used  Substance Use Topics  . Alcohol use: No  . Drug use: No     Allergies   Codeine, Morphine, and Neurontin [gabapentin]   Review of Systems Review of Systems  HENT: Positive for dental problem.    Physical Exam Triage Vital Signs ED Triage Vitals  Enc Vitals Group     BP 08/06/20 1054 (!) 155/102     Pulse Rate 08/06/20 1054 86     Resp 08/06/20 1054 18     Temp 08/06/20 1054 98.6 F (37 C)     Temp Source 08/06/20 1054 Oral     SpO2 08/06/20 1054 98 %     Weight 08/06/20 1051 170 lb (77.1 kg)     Height 08/06/20 1051 5\' 5"  (1.651 m)     Head Circumference --      Peak Flow --      Pain Score 08/06/20 1050 7     Pain Loc --      Pain Edu? --      Excl. in [place]? --    Updated Vital Signs BP (!) 155/102 (BP Location: Right Arm)   Pulse 86   Temp 98.6 F (37 C) (Oral)   Resp 18   Ht 5\' 5"  (1.651 m)   Wt 77.1 kg   SpO2 98%   BMI 28.29 kg/m   Visual Acuity Right Eye Distance:   Left Eye Distance:   Bilateral Distance:    Right Eye Near:   Left Eye Near:    Bilateral Near:     Physical Exam Constitutional:      Appearance: Normal appearance.  HENT:     Head:  Normocephalic and atraumatic.     Mouth/Throat:      Comments: Area of fluctuance noted around the  labeled tooth.  Patient has poor dentition throughout with multiple fractured/decaying teeth. Pulmonary:     Effort: Pulmonary effort is normal. No respiratory distress.  Neurological:     Mental Status: She is alert.  Psychiatric:        Mood and Affect: Mood normal.        Behavior: Behavior normal.    UC Treatments / Results  Labs (all labs ordered are listed, but only abnormal results are displayed) Labs Reviewed - No data to display  EKG   Radiology No results found.  Procedures Procedures (including critical care time)  Medications Ordered in UC Medications - No data to display  Initial Impression / Assessment and Plan / UC Course  I have reviewed the triage vital signs and the nursing notes.  Pertinent labs & imaging results that were available during my care of the patient were reviewed by me and considered in my medical decision making (see chart for details).    44 year old female presents with dental abscess.  Placing on Augmentin.  Hydrocodone as needed for pain.  [place] controlled substance database reviewed.  No concerns for abuse at this time.  Advised to see dentist.  Final Clinical Impressions(s) / UC Diagnoses   Final diagnoses:  Dental abscess     Discharge Instructions     Medication as prescribed.  See local dentist as soon as you can.  Take care  Dr. Lacinda Axon    ED Prescriptions    Medication Sig Dispense Auth. Provider   amoxicillin-clavulanate (AUGMENTIN) 875-125 MG tablet Take 1 tablet by mouth 2 (two) times daily. 20 tablet Annise Boran G, DO   HYDROcodone-acetaminophen (NORCO/VICODIN) 5-325 MG tablet Take 1 tablet by mouth every 8 (eight) hours as needed. 10 tablet Thersa Salt G, DO     I have reviewed the PDMP during this encounter.   Coral Spikes, [place] 08/06/20 1216

## 2020-08-06 NOTE — ED Triage Notes (Signed)
Patient c/o dental abscess on her bottom right tooth that started a few days ago.

## 2020-08-18 ENCOUNTER — Other Ambulatory Visit: Payer: Self-pay | Admitting: Primary Care

## 2020-08-18 DIAGNOSIS — E119 Type 2 diabetes mellitus without complications: Secondary | ICD-10-CM

## 2020-08-23 ENCOUNTER — Other Ambulatory Visit: Payer: Self-pay | Admitting: Primary Care

## 2020-08-23 DIAGNOSIS — E119 Type 2 diabetes mellitus without complications: Secondary | ICD-10-CM

## 2020-09-29 ENCOUNTER — Other Ambulatory Visit: Payer: Self-pay | Admitting: Primary Care

## 2020-09-29 DIAGNOSIS — I10 Essential (primary) hypertension: Secondary | ICD-10-CM

## 2020-09-29 DIAGNOSIS — K219 Gastro-esophageal reflux disease without esophagitis: Secondary | ICD-10-CM

## 2020-09-29 DIAGNOSIS — R000 Tachycardia, unspecified: Secondary | ICD-10-CM

## 2020-10-03 ENCOUNTER — Ambulatory Visit
Admission: EM | Admit: 2020-10-03 | Discharge: 2020-10-03 | Disposition: A | Discharge status: Home / Self Care | Payer: Medicaid Other | Attending: Family Medicine | Admitting: Family Medicine

## 2020-10-03 ENCOUNTER — Other Ambulatory Visit: Payer: Self-pay

## 2020-10-03 ENCOUNTER — Ambulatory Visit (INDEPENDENT_AMBULATORY_CARE_PROVIDER_SITE_OTHER): Payer: Self-pay

## 2020-10-03 ENCOUNTER — Other Ambulatory Visit: Payer: Self-pay | Admitting: Primary Care

## 2020-10-03 DIAGNOSIS — R0981 Nasal congestion: Secondary | ICD-10-CM | POA: Insufficient documentation

## 2020-10-03 DIAGNOSIS — J069 Acute upper respiratory infection, unspecified: Secondary | ICD-10-CM | POA: Insufficient documentation

## 2020-10-03 DIAGNOSIS — R52 Pain, unspecified: Secondary | ICD-10-CM

## 2020-10-03 DIAGNOSIS — E119 Type 2 diabetes mellitus without complications: Secondary | ICD-10-CM

## 2020-10-03 DIAGNOSIS — Z20822 Contact with and (suspected) exposure to covid-19: Secondary | ICD-10-CM | POA: Insufficient documentation

## 2020-10-03 DIAGNOSIS — R110 Nausea: Secondary | ICD-10-CM | POA: Insufficient documentation

## 2020-10-03 DIAGNOSIS — R059 Cough, unspecified: Secondary | ICD-10-CM

## 2020-10-03 DIAGNOSIS — R1010 Upper abdominal pain, unspecified: Secondary | ICD-10-CM | POA: Insufficient documentation

## 2020-10-03 DIAGNOSIS — R519 Headache, unspecified: Secondary | ICD-10-CM

## 2020-10-03 DIAGNOSIS — K219 Gastro-esophageal reflux disease without esophagitis: Secondary | ICD-10-CM

## 2020-10-03 DIAGNOSIS — R1013 Epigastric pain: Secondary | ICD-10-CM | POA: Insufficient documentation

## 2020-10-03 DIAGNOSIS — R109 Unspecified abdominal pain: Secondary | ICD-10-CM

## 2020-10-03 DIAGNOSIS — K047 Periapical abscess without sinus: Secondary | ICD-10-CM

## 2020-10-03 DIAGNOSIS — Z87891 Personal history of nicotine dependence: Secondary | ICD-10-CM | POA: Insufficient documentation

## 2020-10-03 LAB — RESP PANEL BY RT-PCR (FLU A&B, COVID) ARPGX2
Influenza A by PCR: NEGATIVE
Influenza B by PCR: NEGATIVE
SARS Coronavirus 2 by RT PCR: NEGATIVE

## 2020-10-03 LAB — CBC WITH DIFFERENTIAL/PLATELET
Abs Immature Granulocytes: 0.06 10*3/uL (ref 0.00–0.07)
Basophils Absolute: 0.1 10*3/uL (ref 0.0–0.1)
Basophils Relative: 1 %
Eosinophils Absolute: 0.3 10*3/uL (ref 0.0–0.5)
Eosinophils Relative: 3 %
HCT: 41.3 % (ref 36.0–46.0)
Hemoglobin: 13.5 g/dL (ref 12.0–15.0)
Immature Granulocytes: 1 %
Lymphocytes Relative: 19 %
Lymphs Abs: 2 10*3/uL (ref 0.7–4.0)
MCH: 27.1 pg (ref 26.0–34.0)
MCHC: 32.7 g/dL (ref 30.0–36.0)
MCV: 82.8 fL (ref 80.0–100.0)
Monocytes Absolute: 0.7 10*3/uL (ref 0.1–1.0)
Monocytes Relative: 6 %
Neutro Abs: 7.6 10*3/uL (ref 1.7–7.7)
Neutrophils Relative %: 70 %
Platelets: 278 10*3/uL (ref 150–400)
RBC: 4.99 MIL/uL (ref 3.87–5.11)
RDW: 14.3 % (ref 11.5–15.5)
WBC: 10.7 10*3/uL — ABNORMAL HIGH (ref 4.0–10.5)
nRBC: 0 % (ref 0.0–0.2)

## 2020-10-03 LAB — COMPREHENSIVE METABOLIC PANEL
ALT: 22 U/L (ref 0–44)
AST: 20 U/L (ref 15–41)
Albumin: 4 g/dL (ref 3.5–5.0)
Alkaline Phosphatase: 82 U/L (ref 38–126)
Anion gap: 13 (ref 5–15)
BUN: 6 mg/dL (ref 6–20)
CO2: 29 mmol/L (ref 22–32)
Calcium: 9.5 mg/dL (ref 8.9–10.3)
Chloride: 95 mmol/L — ABNORMAL LOW (ref 98–111)
Creatinine, Ser: 0.8 mg/dL (ref 0.44–1.00)
GFR, Estimated: >60 mL/min (ref >60)
Glucose, Bld: 235 mg/dL — ABNORMAL HIGH (ref 70–99)
Potassium: 3.7 mmol/L (ref 3.5–5.1)
Sodium: 137 mmol/L (ref 135–145)
Total Bilirubin: 0.9 mg/dL (ref 0.3–1.2)
Total Protein: 7.5 g/dL (ref 6.5–8.1)

## 2020-10-03 LAB — LIPASE, BLOOD: Lipase: 50 U/L (ref 11–51)

## 2020-10-03 MED ORDER — DICYCLOMINE HCL 20 MG PO TABS: 20.0000 mg | ORAL_TABLET | Freq: Two times a day (BID) | ORAL | 0 refills | Status: DC

## 2020-10-03 MED ORDER — PROMETHAZINE-DM 6.25-15 MG/5ML PO SYRP: 5.0000 mL | ORAL_SOLUTION | Freq: Four times a day (QID) | ORAL | 0 refills | Status: DC | PRN

## 2020-10-03 MED ORDER — BENZONATATE 100 MG PO CAPS: 200.0000 mg | ORAL_CAPSULE | Freq: Three times a day (TID) | ORAL | 0 refills | Status: DC

## 2020-10-03 MED ORDER — AMOXICILLIN-POT CLAVULANATE 875-125 MG PO TABS: 1.0000 | ORAL_TABLET | Freq: Two times a day (BID) | ORAL | 0 refills | Status: AC

## 2020-10-03 NOTE — Discharge Instructions (Signed)
For your cough:  Use the Tessalon Perles during the day for cough.  Take them with a small sip of water.  They may give you some numbness to the base of your tongue or metallic taste in her mouth, this is normal.  Use the Promethazine DM cough syrup as needed for cough at bedtime.  For abdominal pain:  Take the Bentyl every 6 hours as needed for abdominal spasm.  If your abdominal pain gets worse, you develop nausea and vomiting or you cannot keep fluids or medications down, or you start running fevers you to go to the ER for evaluation.  For your dental infection.  Take the Augmentin twice daily with food for 10 days.  You need to make a follow-up appoint with your dentist to take care of your dental issues.

## 2020-10-03 NOTE — ED Triage Notes (Signed)
Patient states that she has been having a cough, abdominal pain, body aches and headaches x 2 days. States that she did take an at home covid test yesterday and was negative but unsure if she did it correctly.

## 2020-10-03 NOTE — ED Provider Notes (Signed)
[facility]    CSN: [id number] Arrival date & time: 10/03/20  [id number]      History   Chief Complaint Chief Complaint  Patient presents with  . Cough    HPI Christine Cook is a 44 y.o. female.   HPI   44 year old female here for evaluation of headache, body aches, cough, and abdominal pain.  Patient reports that the abdominal pain started in the epigastric and right upper quadrant area 2 days ago.  Is been associated with nausea and a decreased appetite.  Patient reports that food does increase the pain and nausea.  Patient does not have her gallbladder.  Patient works that she has had a productive cough for about a week for a brown and green sputum.  In addition she is also had a headache, runny nose, nasal congestion.  Patient denies fever, wheezing, vomiting or diarrhea.  Patient does report that she has had a change to her sense of taste and smell and that all drinks taste flat times the last 2 days.  Patient has been vaccinated against Covid but not have the booster shot and she has received her flu vaccine.  Patient is also complaining of pain and redness to her right lower jaw.  Patient reports that she is concerned there might be an abscess developing.  Has multiple broken teeth on her mandible.  Past Medical History:  Diagnosis Date  . Anxiety   . Benzodiazepine overdose   . Depression   . Diabetes ([place])   . Fatty liver disease, nonalcoholic   . Heart murmur   . Heart palpitations   . High cholesterol   . 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 Hypertension   . Interstitial cystitis   . Irritable bowel syndrome with diarrhea   . 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 Migraine   . 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 Sleep apnea with hypersomnolence    CPAP study 11-28-09,  10-02-2009 AHI was 16.5, pressure to   . Tachycardia     Patient Active Problem List   Diagnosis Date Noted  . Screening for cervical cancer 09/11/2019   . Arthralgia of right temporomandibular joint 02/15/2019  . Plantar fasciitis 01/02/2018  . Back pain 01/02/2018  . Hypokalemia 11/29/2017  . Cervical dysplasia 12/01/2016  . Hyperlipidemia 08/05/2016  . Tachycardia 08/05/2016  . OSA (obstructive sleep apnea) 07/12/2016  . 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 Vitamin B12 deficiency 07/12/2016  . Vitamin D deficiency 07/12/2016  . Diabetes mellitus without complication ([place]) 123456  . 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 Impulse control disorder (gambling) 07/11/2016  . Hypertension 07/09/2016  . GERD 01/21/2009    Past Surgical History:  Procedure Laterality Date  . ADENOIDECTOMY    . BLADDER SURGERY    . CHOLECYSTECTOMY    . KNEE SURGERY     Left knee x 2   . LEEP  10/2016   CIN 1  . TONSILLECTOMY      OB History   No obstetric history on file.      Home Medications    Prior to Admission medications   Medication Sig Start Date End Date Taking? Authorizing Provider  amoxicillin-clavulanate (AUGMENTIN) 875-125 MG tablet Take 1 tablet by mouth every 12 (twelve) hours for 10 days. 10/03/20 10/13/20 Yes Margarette Canada, NP  ARIPiprazole (ABILIFY) 10 MG tablet Take 10 mg by mouth daily.   Yes [provider]  benzonatate (TESSALON) 100 MG capsule Take 2  capsules (200 mg total) by mouth every 8 (eight) hours. 10/03/20  Yes Margarette Canada, NP  Cholecalciferol (VITAMIN D3) 2000 units TABS Take by mouth.   Yes [provider]  Cyanocobalamin (B-12 PO) Take 1,000 mcg by mouth daily.   Yes [provider]  dicyclomine (BENTYL) 20 MG tablet Take 1 tablet (20 mg total) by mouth 2 (two) times daily. 10/03/20  Yes Margarette Canada, NP  glipiZIDE (GLUCOTROL) 10 MG tablet TAKE 1 TABLET BY MOUTH TWICE DAILY BEFORE A MEAL FOR DIABETES 08/26/20  Yes Pleas Koch, NP  levonorgestrel (MIRENA) 20 MCG/24HR IUD 1 each by Intrauterine route once. 01/16/14  Yes Hassell Done, FNP  metoprolol tartrate (LOPRESSOR) 25 MG  tablet Take 1 tablet by mouth twice daily 04/15/20  Yes Pleas Koch, NP  pantoprazole (PROTONIX) 40 MG tablet Take 1 tablet by mouth once daily 07/04/20  Yes Pleas Koch, NP  promethazine-dextromethorphan (PROMETHAZINE-DM) 6.25-15 MG/5ML syrup Take 5 mLs by mouth 4 (four) times daily as needed. 10/03/20  Yes Margarette Canada, NP  venlafaxine (EFFEXOR) 75 MG tablet Take 150 mg by mouth daily.   Yes [provider]  atorvastatin (LIPITOR) 80 MG tablet Take 1 tablet by mouth every evening for cholesterol. 03/26/19 08/06/20  Pleas Koch, NP  diphenhydrAMINE (BENADRYL) 25 mg capsule Take 2 capsules (50 mg total) by mouth every 6 (six) hours as needed. 04/07/20 08/06/20  Carrie Mew, MD  metoCLOPramide (REGLAN) 10 MG tablet Take 1 tablet (10 mg total) by mouth every 6 (six) hours as needed. 04/07/20 08/06/20  Carrie Mew, MD  potassium chloride SA (K-DUR) 20 MEQ tablet Take 1 tablet by mouth once daily 04/12/19 08/06/20  Pleas Koch, NP  QUEtiapine (SEROQUEL XR) 50 MG TB24 24 hr tablet Take 1 tablet (50 mg total) by mouth at bedtime. 07/09/19 08/06/20  Tonia Ghent, MD    Family History Family History  Problem Relation Age of Onset  . Asthma Mother   . Hyperthyroidism Mother   . Rheum arthritis Mother   . Graves' disease Mother   . Sjogren's syndrome Mother   . Migraines Mother   . Asthma Daughter   . Migraines Daughter   . Colon cancer Neg Hx     Social History Social History   Tobacco Use  . Smoking status: Former Smoker    Packs/day: 0.25    Years: 2.00    Pack years: 0.50    Types: Cigarettes    Quit date: 2017    Years since quitting: 5.0  . Smokeless tobacco: Never Used  Vaping Use  . Vaping Use: Never used  Substance Use Topics  . Alcohol use: No  . Drug use: No     Allergies   Codeine, Morphine, and Neurontin [gabapentin]   Review of Systems Review of Systems  Constitutional: Positive for appetite change. Negative for activity  change and fever.  HENT: Positive for congestion, dental problem and rhinorrhea. Negative for sinus pressure.   Respiratory: Positive for cough. Negative for shortness of breath and wheezing.   Cardiovascular: Negative for chest pain.  Gastrointestinal: Positive for abdominal pain and nausea. Negative for blood in stool, diarrhea and vomiting.  Musculoskeletal: Positive for arthralgias.  Skin: Negative for rash.  Neurological: Positive for headaches.  Hematological: Negative.   Psychiatric/Behavioral: Negative.      Physical Exam Triage Vital Signs ED Triage Vitals  Enc Vitals Group     BP 10/03/20 0918 (!) 137/100     Pulse Rate  10/03/20 0918 (!) 110     Resp 10/03/20 0918 18     Temp 10/03/20 0918 98.6 F (37 C)     Temp Source 10/03/20 0918 Oral     SpO2 10/03/20 0918 99 %     Weight 10/03/20 0858 165 lb (74.8 kg)     Height 10/03/20 0858 5\' 5"  (1.651 m)     Head Circumference --      Peak Flow --      Pain Score 10/03/20 0858 6     Pain Loc --      Pain Edu? --      Excl. in [place]? --    No data found.  Updated Vital Signs BP (!) 137/100 (BP Location: Left Arm)   Pulse (!) 110   Temp 98.6 F (37 C) (Oral)   Resp 18   Ht 5\' 5"  (1.651 m)   Wt 165 lb (74.8 kg)   SpO2 99%   BMI 27.46 kg/m   Visual Acuity Right Eye Distance:   Left Eye Distance:   Bilateral Distance:    Right Eye Near:   Left Eye Near:    Bilateral Near:     Physical Exam Vitals and nursing note reviewed.  Constitutional:      General: She is in acute distress.     Appearance: She is ill-appearing.     Comments: Patient appears to be in pain and not feel well.  HENT:     Head: Normocephalic and atraumatic.     Right Ear: Tympanic membrane, ear canal and external ear normal.     Left Ear: Tympanic membrane, ear canal and external ear normal.     Nose: Congestion and rhinorrhea present.     Comments: Mucosa is mildly erythematous and edematous with scant clear nasal discharge.     Mouth/Throat:     Mouth: Mucous membranes are moist.     Pharynx: Oropharynx is clear. Posterior oropharyngeal erythema present. No oropharyngeal exudate.     Comments: The left lower gum tissue is erythematous, mildly edematous, and tender to touch.  Patient has multiple broken teeth that are all tender to percussion.  There is no distinct abscess appreciated on exam. Cardiovascular:     Rate and Rhythm: Normal rate and regular rhythm.     Pulses: Normal pulses.     Heart sounds: Normal heart sounds. No murmur heard. No gallop.   Pulmonary:     Effort: Pulmonary effort is normal.     Breath sounds: Normal breath sounds. No wheezing or rales.  Abdominal:     General: Bowel sounds are normal.     Palpations: Abdomen is soft.     Tenderness: There is abdominal tenderness. There is no guarding or rebound.  Musculoskeletal:     Cervical back: Normal range of motion and neck supple.  Lymphadenopathy:     Cervical: No cervical adenopathy.  Skin:    General: Skin is warm and dry.     Capillary Refill: Capillary refill takes less than 2 seconds.     Findings: No erythema.  Neurological:     General: No focal deficit present.     Mental Status: She is alert and oriented to person, place, and time.  Psychiatric:        Mood and Affect: Mood normal.        Behavior: Behavior normal.        Thought Content: Thought content normal.        Judgment: Judgment  normal.      UC Treatments / Results  Labs (all labs ordered are listed, but only abnormal results are displayed) Labs Reviewed  CBC WITH DIFFERENTIAL/PLATELET - Abnormal; Notable for the following components:      Result Value   WBC 10.7 (*)    All other components within normal limits  COMPREHENSIVE METABOLIC PANEL - Abnormal; Notable for the following components:   Chloride 95 (*)    Glucose, Bld 235 (*)    All other components within normal limits  RESP PANEL BY RT-PCR (FLU A&B, COVID) ARPGX2  LIPASE, BLOOD     EKG   Radiology DG Chest 2 View  Result Date: 10/03/2020 CLINICAL DATA:  Patient states that she has been having a cough, abdominal pain, body aches and headaches x 2 days. States that she did take an at home covid test yesterday and was negative but unsure if she did it correctly EXAM: CHEST - 2 VIEW COMPARISON:  07/09/2016 FINDINGS: The heart size and mediastinal contours are within normal limits. Both lungs are clear. No pleural effusion or pneumothorax. The visualized skeletal structures are unremarkable. IMPRESSION: No active cardiopulmonary disease. Electronically Signed   By: Lajean Manes M.D.   On: 10/03/2020 10:20    Procedures Procedures (including critical care time)  Medications Ordered in UC Medications - No data to display  Initial Impression / Assessment and Plan / UC Course  I have reviewed the triage vital signs and the nursing notes.  Pertinent labs & imaging results that were available during my care of the patient were reviewed by me and considered in my medical decision making (see chart for details).   Patient is here for evaluation of multiple complaints most significant which appears to be her abdominal pain that started 2 days ago.  Patient is also reporting a productive cough for the past week.  Lungs are clear to auscultation but will obtain chest x-ray due to purulent sputum production and shortness of breath.  Patient's abdominal pain described as sharp and started in her epigastric area.  Physical exam reveals a protuberant, soft abdomen with positive bowel sounds in all 4 quadrants.  Patient has marked tenderness in the epigastric and right upper quadrant.  Less on the left upper quadrant.  No guarding appreciated on exam.  No rebound.  Patient did become tearful after palpation of her abdomen.  Patient does not have her gallbladder.  Will obtain CBC, CMP, and lipase.  Chest x-ray independently evaluated by me, interpretation: Normal chest x-ray.  Upon  reassessment patient evaluated and aspect any urinary complaints.  Patient denies dysuria, frequency, urgency, or hematuria.  Patient reports that the pain is sharp and radiates through to her back.  Patient denies any alcohol consumption.  Patient reports that she has been dealing with some constipation recently but she thought it was related to taking the Zofran for nausea.  CBC shows a white count of 10.7 otherwise unremarkable.  Respiratory panel is negative for Covid or flu.  Discussed findings with patient and the need for possible imaging of her abdomen.  Patient would prefer to go home with supportive care and then go to the ER if her symptoms worsen.  Will give patient Tessalon Perles and Promethazine DM for her cough, Bentyl for her abdominal pain, and will treat her with Augmentin 875 twice daily x10 days for a possible developing dental infection.  Final Clinical Impressions(s) / UC Diagnoses   Final diagnoses:  Viral URI with cough  Pain  of upper abdomen  Dental infection     Discharge Instructions     For your cough:  Use the Tessalon Perles during the day for cough.  Take them with a small sip of water.  They may give you some numbness to the base of your tongue or metallic taste in her mouth, this is normal.  Use the Promethazine DM cough syrup as needed for cough at bedtime.  For abdominal pain:  Take the Bentyl every 6 hours as needed for abdominal spasm.  If your abdominal pain gets worse, you develop nausea and vomiting or you cannot keep fluids or medications down, or you start running fevers you to go to the ER for evaluation.  For your dental infection.  Take the Augmentin twice daily with food for 10 days.  You need to make a follow-up appoint with your dentist to take care of your dental issues.    ED Prescriptions    Medication Sig Dispense Auth. Provider   amoxicillin-clavulanate (AUGMENTIN) 875-125 MG tablet Take 1 tablet by mouth every 12 (twelve)  hours for 10 days. 20 tablet Becky Augusta, NP   benzonatate (TESSALON) 100 MG capsule Take 2 capsules (200 mg total) by mouth every 8 (eight) hours. 21 capsule Becky Augusta, NP   dicyclomine (BENTYL) 20 MG tablet Take 1 tablet (20 mg total) by mouth 2 (two) times daily. 20 tablet Becky Augusta, NP   promethazine-dextromethorphan (PROMETHAZINE-DM) 6.25-15 MG/5ML syrup Take 5 mLs by mouth 4 (four) times daily as needed. 118 mL Becky Augusta, NP     PDMP not reviewed this encounter.   Becky Augusta, NP 10/03/20 1046

## 2020-10-06 ENCOUNTER — Telehealth: Payer: Self-pay | Admitting: Primary Care

## 2020-10-06 ENCOUNTER — Other Ambulatory Visit: Payer: Self-pay

## 2020-10-06 DIAGNOSIS — R000 Tachycardia, unspecified: Secondary | ICD-10-CM

## 2020-10-06 DIAGNOSIS — I10 Essential (primary) hypertension: Secondary | ICD-10-CM

## 2020-10-06 MED ORDER — METOPROLOL TARTRATE 25 MG PO TABS: 25.0000 mg | ORAL_TABLET | Freq: Two times a day (BID) | ORAL | 0 refills | Status: DC

## 2020-10-06 NOTE — Telephone Encounter (Signed)
Patient is needing a refill on the following: Metoprolol tartrate  Pharmacy: [facility]- [address]

## 2020-10-06 NOTE — Telephone Encounter (Signed)
Called patient made appointment for 10/20/2020. Informed that we will call in one month refill. Patient must keep appointment to get any further refills.

## 2020-10-07 NOTE — Telephone Encounter (Signed)
Noted and agree. 

## 2020-10-20 ENCOUNTER — Ambulatory Visit: Payer: Medicaid Other | Admitting: Primary Care

## 2020-10-28 ENCOUNTER — Other Ambulatory Visit: Payer: Self-pay

## 2020-10-28 ENCOUNTER — Encounter: Payer: Self-pay | Admitting: Primary Care

## 2020-10-28 ENCOUNTER — Ambulatory Visit (INDEPENDENT_AMBULATORY_CARE_PROVIDER_SITE_OTHER): Payer: Commercial Managed Care - HMO | Admitting: Primary Care

## 2020-10-28 VITALS — BP 120/78 | HR 97 | Temp 97.6°F | Ht 65.0 in | Wt 165.0 lb

## 2020-10-28 DIAGNOSIS — R000 Tachycardia, unspecified: Secondary | ICD-10-CM | POA: Diagnosis not present

## 2020-10-28 DIAGNOSIS — Z1231 Encounter for screening mammogram for malignant neoplasm of breast: Secondary | ICD-10-CM

## 2020-10-28 DIAGNOSIS — E119 Type 2 diabetes mellitus without complications: Secondary | ICD-10-CM | POA: Diagnosis not present

## 2020-10-28 DIAGNOSIS — E785 Hyperlipidemia, unspecified: Secondary | ICD-10-CM | POA: Diagnosis not present

## 2020-10-28 DIAGNOSIS — K219 Gastro-esophageal reflux disease without esophagitis: Secondary | ICD-10-CM

## 2020-10-28 DIAGNOSIS — N879 Dysplasia of cervix uteri, unspecified: Secondary | ICD-10-CM

## 2020-10-28 DIAGNOSIS — F332 Major depressive disorder, recurrent severe without psychotic features: Secondary | ICD-10-CM

## 2020-10-28 DIAGNOSIS — I10 Essential (primary) hypertension: Secondary | ICD-10-CM

## 2020-10-28 LAB — LDL CHOLESTEROL, DIRECT: Direct LDL: 148 mg/dL

## 2020-10-28 LAB — POCT GLYCOSYLATED HEMOGLOBIN (HGB A1C): Hemoglobin A1C: 10.2 % — AB (ref 4.0–5.6)

## 2020-10-28 LAB — COMPREHENSIVE METABOLIC PANEL
ALT: 12 U/L (ref 0–35)
AST: 12 U/L (ref 0–37)
Albumin: 4.4 g/dL (ref 3.5–5.2)
Alkaline Phosphatase: 86 U/L (ref 39–117)
BUN: 10 mg/dL (ref 6–23)
CO2: 29 mEq/L (ref 19–32)
Calcium: 9.7 mg/dL (ref 8.4–10.5)
Chloride: 98 mEq/L (ref 96–112)
Creatinine, Ser: 0.88 mg/dL (ref 0.40–1.20)
GFR: 79.38 mL/min (ref >60.00)
Glucose, Bld: 259 mg/dL — ABNORMAL HIGH (ref 70–99)
Potassium: 3.4 mEq/L — ABNORMAL LOW (ref 3.5–5.1)
Sodium: 138 mEq/L (ref 135–145)
Total Bilirubin: 0.5 mg/dL (ref 0.2–1.2)
Total Protein: 7.1 g/dL (ref 6.0–8.3)

## 2020-10-28 LAB — CBC
HCT: 41.8 % (ref 36.0–46.0)
Hemoglobin: 13.8 g/dL (ref 12.0–15.0)
MCHC: 33 g/dL (ref 30.0–36.0)
MCV: 81.9 fl (ref 78.0–100.0)
Platelets: 287 10*3/uL (ref 150.0–400.0)
RBC: 5.1 Mil/uL (ref 3.87–5.11)
RDW: 15.2 % (ref 11.5–15.5)
WBC: 7.9 10*3/uL (ref 4.0–10.5)

## 2020-10-28 LAB — LIPID PANEL
Cholesterol: 310 mg/dL — ABNORMAL HIGH (ref 0–200)
HDL: 36.9 mg/dL — ABNORMAL LOW (ref >39.00)
Total CHOL/HDL Ratio: 8
Triglycerides: 871 mg/dL — ABNORMAL HIGH (ref 0.0–149.0)

## 2020-10-28 LAB — MICROALBUMIN / CREATININE URINE RATIO
Creatinine,U: 140.5 mg/dL
Microalb Creat Ratio: 0.9 mg/g (ref 0.0–30.0)
Microalb, Ur: 1.3 mg/dL (ref 0.0–1.9)

## 2020-10-28 MED ORDER — BASAGLAR KWIKPEN 100 UNIT/ML ~~LOC~~ SOPN: 10.0000 [IU] | PEN_INJECTOR | Freq: Every day | SUBCUTANEOUS | 2 refills | Status: DC

## 2020-10-28 MED ORDER — PANTOPRAZOLE SODIUM 40 MG PO TBEC: 40.0000 mg | DELAYED_RELEASE_TABLET | Freq: Every day | ORAL | 0 refills | Status: DC

## 2020-10-28 MED ORDER — BLOOD GLUCOSE MONITOR KIT: PACK | 0 refills | Status: AC

## 2020-10-28 MED ORDER — METOPROLOL TARTRATE 25 MG PO TABS: 25.0000 mg | ORAL_TABLET | Freq: Two times a day (BID) | ORAL | 3 refills | Status: DC

## 2020-10-28 MED ORDER — GLIPIZIDE 10 MG PO TABS: ORAL_TABLET | ORAL | 3 refills | Status: DC

## 2020-10-28 MED ORDER — METFORMIN HCL ER 500 MG PO TB24
500.0000 mg | ORAL_TABLET | Freq: Every day | ORAL | 1 refills | Status: DC
Start: 2020-10-28 — End: 2021-06-23

## 2020-10-28 MED ORDER — PEN NEEDLES 31G X 6 MM MISC
2 refills | Status: DC
Start: 2020-10-28 — End: 2021-11-02

## 2020-10-28 NOTE — Patient Instructions (Addendum)
Stop by the lab prior to leaving today. I will notify you of your results once received.   [facility] to schedule your [facility].   Please Schedule your eye exam.   Start Basaglar insulin. Inject 10 units into the skin at bedtime.You may increase your insulin to 15 units after one week if glucose readings are consistently at or above 150.   Start checking your blood sugar levels.  Appropriate times to check your blood sugar levels are:  -Before any meal (breakfast, lunch, dinner) -Two hours after any meal (breakfast, lunch, dinner) -Bedtime  You will be contacted regarding your referral to GI for the colonoscopy.  Please let us know if you have not been contacted within two weeks.   Please schedule a follow up appointment in 3 months for diabetes check.  It was a pleasure to see you today!

## 2020-10-28 NOTE — Assessment & Plan Note (Signed)
Uncontrolled with A1C today of 10.2. Continue Glipizide 10 mg BID, metformin XR 500 mg daily.   Add Basaglar 10 units HS, increase to 15 units if glucose readings remain at or above 150 consistently. Rx for new glucometer sent to pharmacy. Discussed appropriate times to check glucose.  She will schedule eye exam. Foot exam next visit. Pneumonia vaccine UTD. Urine micro due and pending.  Follow up in 3 months.

## 2020-10-28 NOTE — Assessment & Plan Note (Signed)
Compliant to high dose PPI daily, still with symptoms, also with new symptoms of epigastric pain. Lipase reviewed from one month ago, negative.  Referral placed to GI. Continue pantoprazole 40 mg daily.

## 2020-10-28 NOTE — Assessment & Plan Note (Signed)
Pap from 2020 negative, recommended to set up with GYN given history and also given IUD that needs to be removed.

## 2020-10-28 NOTE — Assessment & Plan Note (Signed)
Doing well on metoprolol tartrate 25 mg BID, continue same.

## 2020-10-28 NOTE — Assessment & Plan Note (Signed)
Following with psychiatry, feels well managed. Continue Abilify 10 mg daily and venlafaxine 100 mg BID.

## 2020-10-28 NOTE — Assessment & Plan Note (Signed)
Well controlled in the office today. Continue metoprolol tartrate 25 mg BID. Refills sent to pharmacy.

## 2020-10-28 NOTE — Progress Notes (Signed)
Subjective:    Patient ID: Christine Cook, female    DOB: [birth year], 45 y.o.   MRN: [id number]  HPI  This visit occurred during the SARS-CoV-2 public health emergency.  Safety protocols were in place, including screening questions prior to the visit, additional usage of staff PPE, and extensive cleaning of exam room while observing appropriate contact time as indicated for disinfecting solutions.   Christine Cook is a 45 year old female with a history of hypertension, OSA, type 2 diabetes, MDD, hyperlipidemia who presents today for follow up.  1) Type 2 Diabetes:  Current medications include: Glipizide 10 mg BID, Metformin XR 500 mg once daily for which she resumed about 2 months ago, had old bottles at home. She is reticent to start Trulicity due to GI side effects. Would like to start Lantus. Would like a new glucometer.  She is checking her blood glucose 1-2  times weekly and is getting readings of: 200-300's   Last A1C: 8.0 in December 2020, no follow up since.  Last Eye Exam: No recent exam Last Foot Exam: Due Pneumonia Vaccination: 2019 ACE/ARB: None. Urine micro due Statin: None. Previously on atorvastatin 80 mg.   2) Essential Hypertension: Currently managed on metoprolol tartrate 25 mg BID. She denies chest pain, dizziness.   3) GERD: Currently managed on pantoprazole 40 mg for which she must take daily.  She has noticed intermittent epigastric pain for the last several weeks. Denies nausea, vomiting. Pain occurs sporadically with and without eating. She will take Tums or Pepcid with improvement. She was evaluated at urgent care on 10/03/20 for the same pain, negative lipase and work up. She has not seen GI.   4) MDD: Following with psychiatry, managed on venlafaxine 100 mg BID, Abilify 10 mg. Overall feels well managed. She is currently not following with therapy, plans on getting back in since she has insurance.   BP Readings from Last 3 Encounters:  10/28/20 120/78  10/03/20  (!) 137/100  08/06/20 (!) 155/102   Wt Readings from Last 3 Encounters:  10/28/20 165 lb (74.8 kg)  10/03/20 165 lb (74.8 kg)  08/06/20 170 lb (77.1 kg)     Review of Systems  Respiratory: Negative for shortness of breath.   Cardiovascular: Negative for chest pain.  Gastrointestinal: Positive for abdominal pain.       See HPI  Neurological: Negative for dizziness.  Psychiatric/Behavioral:       Follows with psychiatry, feels well managed       Past Medical History:  Diagnosis Date  . Anxiety   . Benzodiazepine overdose   . Depression   . Diabetes ([place])   . Fatty liver disease, nonalcoholic   . Heart murmur   . Heart palpitations   . High cholesterol   . 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 Hypertension   . Interstitial cystitis   . Irritable bowel syndrome with diarrhea   . 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 Migraine   . 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 Sleep apnea with hypersomnolence    CPAP study 11-28-09,  10-02-2009 AHI was 16.5, pressure to   . Tachycardia      Social History   Socioeconomic History  . Marital status: Single    Spouse name: Not on file  . Number of children: 1  . Years of education: College  . Highest education level: Not on file  Occupational History  . Occupation: Health visitor  Employer: [employer]  Tobacco Use  . Smoking status: Former Smoker    Packs/day: 0.25    Years: 2.00    Pack years: 0.50    Types: Cigarettes    Quit date: 2017    Years since quitting: 5.0  . Smokeless tobacco: Never Used  Vaping Use  . Vaping Use: Never used  Substance and Sexual Activity  . Alcohol use: No  . Drug use: No  . Sexual activity: Yes    Birth control/protection: I.U.D.  Other Topics Concern  . Not on file  Social History Narrative   Single.   One daughter.   Works at [employer].   Enjoys relaxing, spending time with family.   Social Determinants of Health   Financial Resource  Strain: Not on file  Food Insecurity: Not on file  Transportation Needs: Not on file  Physical Activity: Not on file  Stress: Not on file  Social Connections: Not on file  Intimate Partner Violence: Not on file    Past Surgical History:  Procedure Laterality Date  . ADENOIDECTOMY    . BLADDER SURGERY    . CHOLECYSTECTOMY    . KNEE SURGERY     Left knee x 2   . LEEP  10/2016   CIN 1  . TONSILLECTOMY      Family History  Problem Relation Age of Onset  . Asthma Mother   . Hyperthyroidism Mother   . Rheum arthritis Mother   . Graves' disease Mother   . Sjogren's syndrome Mother   . Migraines Mother   . Asthma Daughter   . Migraines Daughter   . Colon cancer Neg Hx     Allergies  Allergen Reactions  . Codeine Hives and Nausea And Vomiting  . Morphine Hives  . Neurontin [Gabapentin] Other (See Comments)    syncope    Current Outpatient Medications on File Prior to Visit  Medication Sig Dispense Refill  . ARIPiprazole (ABILIFY) 10 MG tablet Take 10 mg by mouth daily.    . Cholecalciferol (VITAMIN D3) 2000 units TABS Take by mouth.    . Cyanocobalamin (B-12 PO) Take 1,000 mcg by mouth daily.    Marland Kitchen levonorgestrel (MIRENA) 20 MCG/24HR IUD 1 each by Intrauterine route once.    . venlafaxine (EFFEXOR) 100 MG tablet Take 300 mg by mouth daily.    . [DISCONTINUED] atorvastatin (LIPITOR) 80 MG tablet Take 1 tablet by mouth every evening for cholesterol. 90 tablet 2  . [DISCONTINUED] diphenhydrAMINE (BENADRYL) 25 mg capsule Take 2 capsules (50 mg total) by mouth every 6 (six) hours as needed. 60 capsule 0  . [DISCONTINUED] metoCLOPramide (REGLAN) 10 MG tablet Take 1 tablet (10 mg total) by mouth every 6 (six) hours as needed. 30 tablet 0  . [DISCONTINUED] potassium chloride SA (K-DUR) 20 MEQ tablet Take 1 tablet by mouth once daily 90 tablet 1  . [DISCONTINUED] QUEtiapine (SEROQUEL XR) 50 MG TB24 24 hr tablet Take 1 tablet (50 mg total) by mouth at bedtime.     No current  facility-administered medications on file prior to visit.    BP 120/78   Pulse 97   Temp 97.6 F (36.4 C) (Temporal)   Ht 5\' 5"  (1.651 m)   Wt 165 lb (74.8 kg)   SpO2 98%   BMI 27.46 kg/m    Objective:   Physical Exam Constitutional:      Appearance: She is well-nourished.  Cardiovascular:     Rate and Rhythm: Normal rate and regular  rhythm.  Pulmonary:     Effort: Pulmonary effort is normal.     Breath sounds: Normal breath sounds.  Musculoskeletal:     Cervical back: Neck supple.  Skin:    General: Skin is warm and dry.  Psychiatric:        Mood and Affect: Mood and affect normal.            Assessment & Plan:

## 2020-10-28 NOTE — Assessment & Plan Note (Signed)
No longer on atorvastatin, will likely resume. Lipid panel pending.

## 2020-10-29 MED ORDER — ROSUVASTATIN CALCIUM 20 MG PO TABS: 20.0000 mg | ORAL_TABLET | Freq: Every day | ORAL | 3 refills | Status: DC

## 2020-11-06 ENCOUNTER — Other Ambulatory Visit: Payer: Self-pay | Admitting: Primary Care

## 2020-11-06 DIAGNOSIS — K219 Gastro-esophageal reflux disease without esophagitis: Secondary | ICD-10-CM

## 2020-11-06 DIAGNOSIS — Z1211 Encounter for screening for malignant neoplasm of colon: Secondary | ICD-10-CM

## 2020-12-19 ENCOUNTER — Ambulatory Visit: Payer: Self-pay

## 2020-12-22 ENCOUNTER — Encounter: Payer: Self-pay | Admitting: Primary Care

## 2020-12-22 LAB — HM DIABETES EYE EXAM

## 2020-12-23 ENCOUNTER — Other Ambulatory Visit: Payer: Self-pay

## 2020-12-23 ENCOUNTER — Ambulatory Visit
Admission: RE | Admit: 2020-12-23 | Discharge: 2020-12-23 | Disposition: A | Discharge status: Home / Self Care | Payer: Commercial Managed Care - HMO | Source: Ambulatory Visit | Attending: Emergency Medicine | Admitting: Emergency Medicine

## 2020-12-23 VITALS — BP 120/86 | HR 86 | Temp 97.8°F | Resp 18 | Ht 65.0 in | Wt 170.0 lb

## 2020-12-23 DIAGNOSIS — M549 Dorsalgia, unspecified: Secondary | ICD-10-CM

## 2020-12-23 DIAGNOSIS — K529 Noninfective gastroenteritis and colitis, unspecified: Secondary | ICD-10-CM | POA: Diagnosis not present

## 2020-12-23 DIAGNOSIS — R109 Unspecified abdominal pain: Secondary | ICD-10-CM

## 2020-12-23 LAB — URINALYSIS, COMPLETE (UACMP) WITH MICROSCOPIC
Bilirubin Urine: NEGATIVE
Glucose, UA: >1000 mg/dL — AB
Ketones, ur: NEGATIVE mg/dL
Leukocytes,Ua: NEGATIVE
Nitrite: NEGATIVE
Protein, ur: NEGATIVE mg/dL
Specific Gravity, Urine: 1.015 (ref 1.005–1.030)
pH: 5.5 (ref 5.0–8.0)

## 2020-12-23 LAB — CBC WITH DIFFERENTIAL/PLATELET
Abs Immature Granulocytes: 0.03 10*3/uL (ref 0.00–0.07)
Basophils Absolute: 0 10*3/uL (ref 0.0–0.1)
Basophils Relative: 0 %
Eosinophils Absolute: 0.4 10*3/uL (ref 0.0–0.5)
Eosinophils Relative: 4 %
HCT: 43.8 % (ref 36.0–46.0)
Hemoglobin: 14.4 g/dL (ref 12.0–15.0)
Immature Granulocytes: 0 %
Lymphocytes Relative: 20 %
Lymphs Abs: 1.9 10*3/uL (ref 0.7–4.0)
MCH: 26.9 pg (ref 26.0–34.0)
MCHC: 32.9 g/dL (ref 30.0–36.0)
MCV: 81.9 fL (ref 80.0–100.0)
Monocytes Absolute: 0.7 10*3/uL (ref 0.1–1.0)
Monocytes Relative: 7 %
Neutro Abs: 6.3 10*3/uL (ref 1.7–7.7)
Neutrophils Relative %: 69 %
Platelets: 285 10*3/uL (ref 150–400)
RBC: 5.35 MIL/uL — ABNORMAL HIGH (ref 3.87–5.11)
RDW: 14.3 % (ref 11.5–15.5)
WBC: 9.3 10*3/uL (ref 4.0–10.5)
nRBC: 0 % (ref 0.0–0.2)

## 2020-12-23 LAB — COMPREHENSIVE METABOLIC PANEL
ALT: 18 U/L (ref 0–44)
AST: 21 U/L (ref 15–41)
Albumin: 3.9 g/dL (ref 3.5–5.0)
Alkaline Phosphatase: 85 U/L (ref 38–126)
Anion gap: 12 (ref 5–15)
BUN: 9 mg/dL (ref 6–20)
CO2: 23 mmol/L (ref 22–32)
Calcium: 9.3 mg/dL (ref 8.9–10.3)
Chloride: 98 mmol/L (ref 98–111)
Creatinine, Ser: 0.79 mg/dL (ref 0.44–1.00)
GFR, Estimated: >60 mL/min (ref >60)
Glucose, Bld: 439 mg/dL — ABNORMAL HIGH (ref 70–99)
Potassium: 3.4 mmol/L — ABNORMAL LOW (ref 3.5–5.1)
Sodium: 133 mmol/L — ABNORMAL LOW (ref 135–145)
Total Bilirubin: 0.5 mg/dL (ref 0.3–1.2)
Total Protein: 7.7 g/dL (ref 6.5–8.1)

## 2020-12-23 LAB — LIPASE, BLOOD: Lipase: 45 U/L (ref 11–51)

## 2020-12-23 LAB — GLUCOSE, CAPILLARY: Glucose-Capillary: 413 mg/dL — ABNORMAL HIGH (ref 70–99)

## 2020-12-23 MED ORDER — ONDANSETRON 8 MG PO TBDP: 8.0000 mg | ORAL_TABLET | Freq: Three times a day (TID) | ORAL | 0 refills | Status: DC | PRN

## 2020-12-23 MED ORDER — SODIUM CHLORIDE 0.9 % IV BOLUS
1000.0000 mL | Freq: Once | INTRAVENOUS | Status: AC
  Administered 2020-12-23: 1000 mL via INTRAVENOUS

## 2020-12-23 NOTE — ED Triage Notes (Signed)
Patient c/o mid upper abdominal pain and middle back pain that started on Friday. She does report loose stools and nausea, but denies vomiting. She is able to eat and drink normally.

## 2020-12-23 NOTE — Discharge Instructions (Addendum)
Take the Zofran every 8 hours as needed for nausea.  Continue to work on pushing oral fluids to maintain hydration and keep your bowel some rest.  I would recommend following a clear liquid diet for the next 12 to 24 hours.  Clear liquids consist of things like broth, Jell-O, ginger ale, water, and Pedialyte.  After 12 to 24 hours you can slowly start adding bland foods to your diet such as bananas, rice, applesauce, and toast.  If you develop any fever, increase in your abdominal pain, your abdomen starts to swell, or you start passing gas or stool you need to go to the ER for evaluation.

## 2020-12-23 NOTE — ED Provider Notes (Signed)
[facility]    CSN: [id number] Arrival date & time: 12/23/20  1259      History   Chief Complaint Chief Complaint  Patient presents with  . Abdominal Pain  . Back Pain    HPI Christine Cook is a 45 y.o. female.   HPI   45 year old female here for evaluation of upper abdominal pain and mid back pain.  Patient reports that she has been experiencing epigastric abdominal pain that radiates through to her back for the last 4 days.  This has been associated with nausea and diarrhea but no vomiting.  Patient is also had increased urinary frequency and urgency.  Patient states she has had some sweating episodes and her sugars have been running in the 300s.  Patient denies fever, vomiting, or pain with urination.  Patient does report that her epigastric pain increases when she eats.  Past Medical History:  Diagnosis Date  . Anxiety   . Benzodiazepine overdose   . Depression   . Diabetes ([place])   . Fatty liver disease, nonalcoholic   . Heart murmur   . Heart palpitations   . High cholesterol   . 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 Hypertension   . Interstitial cystitis   . Irritable bowel syndrome with diarrhea   . 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 Migraine   . 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 Sleep apnea with hypersomnolence    CPAP study 11-28-09,  10-02-2009 AHI was 16.5, pressure to   . Tachycardia     Patient Active Problem List   Diagnosis Date Noted  . Screening for cervical cancer 09/11/2019  . Arthralgia of right temporomandibular joint 02/15/2019  . Plantar fasciitis 01/02/2018  . Back pain 01/02/2018  . Hypokalemia 11/29/2017  . Cervical dysplasia 12/01/2016  . Hyperlipidemia 08/05/2016  . Tachycardia 08/05/2016  . OSA (obstructive sleep apnea) 07/12/2016  . 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 Vitamin B12 deficiency 07/12/2016  . Vitamin D deficiency 07/12/2016  . Diabetes mellitus without complication ([place])  19/41/7408  . 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 Impulse control disorder (gambling) 07/11/2016  . Essential hypertension 07/09/2016  . GERD 01/21/2009    Past Surgical History:  Procedure Laterality Date  . ADENOIDECTOMY    . BLADDER SURGERY    . CHOLECYSTECTOMY    . KNEE SURGERY     Left knee x 2   . LEEP  10/2016   CIN 1  . TONSILLECTOMY      OB History   No obstetric history on file.      Home Medications    Prior to Admission medications   Medication Sig Start Date End Date Taking? Authorizing Provider  ARIPiprazole (ABILIFY) 10 MG tablet Take 10 mg by mouth daily.   Yes [provider]  blood glucose meter kit and supplies KIT Dispense based on patient and insurance preference. Use up to four times daily as directed. (FOR ICD-9 250.00, 250.01). 10/28/20  Yes Pleas Koch, NP  Cholecalciferol (VITAMIN D3) 2000 units TABS Take by mouth.   Yes [provider]  Cyanocobalamin (B-12 PO) Take 1,000 mcg by mouth daily.   Yes [provider]  glipiZIDE (GLUCOTROL) 10 MG tablet TAKE 1 TABLET BY MOUTH TWICE DAILY BEFORE A MEAL FOR DIABETES. 10/28/20  Yes Pleas Koch, NP  Insulin Glargine (BASAGLAR KWIKPEN) 100 UNIT/ML Inject 10 Units into the skin at bedtime. For diabetes. 10/28/20  Yes Pleas Koch,  NP  Insulin Pen Needle (PEN NEEDLES) 31G X 6 MM MISC Use nightly with insulin. 10/28/20  Yes Pleas Koch, NP  levonorgestrel (MIRENA) 20 MCG/24HR IUD 1 each by Intrauterine route once. 01/16/14  Yes Hassell Done, FNP  metFORMIN (GLUCOPHAGE-XR) 500 MG 24 hr tablet Take 1 tablet (500 mg total) by mouth daily with breakfast. For diabetes. 10/28/20  Yes Pleas Koch, NP  metoprolol tartrate (LOPRESSOR) 25 MG tablet Take 1 tablet (25 mg total) by mouth 2 (two) times daily. For heart rate. 10/28/20  Yes Pleas Koch, NP  ondansetron (ZOFRAN ODT) 8 MG disintegrating tablet Take 1 tablet (8 mg total) by mouth every 8  (eight) hours as needed for nausea or vomiting. 12/23/20  Yes Margarette Canada, NP  pantoprazole (PROTONIX) 40 MG tablet Take 1 tablet (40 mg total) by mouth daily. For heartburn 10/28/20  Yes Pleas Koch, NP  rosuvastatin (CRESTOR) 20 MG tablet Take 1 tablet (20 mg total) by mouth daily. For cholesterol. 10/29/20  Yes Pleas Koch, NP  venlafaxine (EFFEXOR) 100 MG tablet Take 300 mg by mouth daily.   Yes [provider]  atorvastatin (LIPITOR) 80 MG tablet Take 1 tablet by mouth every evening for cholesterol. 03/26/19 08/06/20  Pleas Koch, NP  diphenhydrAMINE (BENADRYL) 25 mg capsule Take 2 capsules (50 mg total) by mouth every 6 (six) hours as needed. 04/07/20 08/06/20  Carrie Mew, MD  metoCLOPramide (REGLAN) 10 MG tablet Take 1 tablet (10 mg total) by mouth every 6 (six) hours as needed. 04/07/20 08/06/20  Carrie Mew, MD  potassium chloride SA (K-DUR) 20 MEQ tablet Take 1 tablet by mouth once daily 04/12/19 08/06/20  Pleas Koch, NP  QUEtiapine (SEROQUEL XR) 50 MG TB24 24 hr tablet Take 1 tablet (50 mg total) by mouth at bedtime. 07/09/19 08/06/20  Tonia Ghent, MD    Family History Family History  Problem Relation Age of Onset  . Asthma Mother   . Hyperthyroidism Mother   . Rheum arthritis Mother   . Graves' disease Mother   . Sjogren's syndrome Mother   . Migraines Mother   . Asthma Daughter   . Migraines Daughter   . Colon cancer Neg Hx     Social History Social History   Tobacco Use  . Smoking status: Former Smoker    Packs/day: 0.25    Years: 2.00    Pack years: 0.50    Types: Cigarettes    Quit date: 2017    Years since quitting: 5.2  . Smokeless tobacco: Never Used  Vaping Use  . Vaping Use: Never used  Substance Use Topics  . Alcohol use: No  . Drug use: No     Allergies   Codeine, Morphine, and Neurontin [gabapentin]   Review of Systems Review of Systems  Constitutional: Positive for appetite change and diaphoresis.  Negative for activity change and fever.  Gastrointestinal: Positive for abdominal pain, diarrhea and nausea. Negative for blood in stool and vomiting.  Genitourinary: Positive for frequency and urgency. Negative for dysuria and hematuria.  Musculoskeletal: Positive for back pain.  Skin: Negative for rash.  Hematological: Negative.   Psychiatric/Behavioral: Negative.      Physical Exam Triage Vital Signs ED Triage Vitals  Enc Vitals Group     BP 12/23/20 1344 (!) 125/96     Pulse Rate 12/23/20 1344 98     Resp 12/23/20 1344 18     Temp 12/23/20 1344 97.8 F (36.6 C)  Temp Source 12/23/20 1344 Oral     SpO2 12/23/20 1344 98 %     Weight 12/23/20 1341 170 lb (77.1 kg)     Height 12/23/20 1341 '5\' 5"'  (1.651 m)     Head Circumference --      Peak Flow --      Pain Score 12/23/20 1341 6     Pain Loc --      Pain Edu? --      Excl. in [place]? --    No data found.  Updated Vital Signs BP (!) 125/96 (BP Location: Right Arm)   Pulse 98   Temp 97.8 F (36.6 C) (Oral)   Resp 18   Ht '5\' 5"'  (1.651 m)   Wt 170 lb (77.1 kg)   SpO2 98%   BMI 28.29 kg/m   Visual Acuity Right Eye Distance:   Left Eye Distance:   Bilateral Distance:    Right Eye Near:   Left Eye Near:    Bilateral Near:     Physical Exam Vitals and nursing note reviewed.  Constitutional:      General: She is not in acute distress.    Appearance: She is well-developed and normal weight. She is not ill-appearing.  HENT:     Head: Normocephalic and atraumatic.  Cardiovascular:     Rate and Rhythm: Normal rate and regular rhythm.     Heart sounds: Normal heart sounds. No murmur heard. No friction rub.  Pulmonary:     Effort: Pulmonary effort is normal.     Breath sounds: Normal breath sounds. No wheezing, rhonchi or rales.  Abdominal:     General: Abdomen is protuberant. Bowel sounds are normal. There is no distension.     Palpations: Abdomen is soft. There is no hepatomegaly or splenomegaly.      Tenderness: There is abdominal tenderness in the right upper quadrant, epigastric area and left upper quadrant. There is guarding. There is no right CVA tenderness, left CVA tenderness or rebound. Positive signs include Murphy's sign. Negative signs include McBurney's sign.     Hernia: No hernia is present.  Skin:    General: Skin is warm and dry.     Capillary Refill: Capillary refill takes less than 2 seconds.     Findings: No erythema or rash.  Neurological:     General: No focal deficit present.     Mental Status: She is alert and oriented to person, place, and time.  Psychiatric:        Mood and Affect: Mood normal.        Behavior: Behavior normal.      UC Treatments / Results  Labs (all labs ordered are listed, but only abnormal results are displayed) Labs Reviewed  URINALYSIS, COMPLETE (UACMP) WITH MICROSCOPIC - Abnormal; Notable for the following components:      Result Value   APPearance HAZY (*)    Glucose, UA >1,000 (*)    Hgb urine dipstick SMALL (*)    Bacteria, UA FEW (*)    All other components within normal limits  GLUCOSE, CAPILLARY - Abnormal; Notable for the following components:   Glucose-Capillary 413 (*)    All other components within normal limits  CBC WITH DIFFERENTIAL/PLATELET - Abnormal; Notable for the following components:   RBC 5.35 (*)    All other components within normal limits  COMPREHENSIVE METABOLIC PANEL - Abnormal; Notable for the following components:   Sodium 133 (*)    Potassium 3.4 (*)  Glucose, Bld 439 (*)    All other components within normal limits  LIPASE, BLOOD  CBG MONITORING, ED    EKG   Radiology No results found.  Procedures Procedures (including critical care time)  Medications Ordered in UC Medications  sodium chloride 0.9 % bolus 1,000 mL (1,000 mLs Intravenous New Bag/Given 12/23/20 1452)    Initial Impression / Assessment and Plan / UC Course  I have reviewed the triage vital signs and the nursing  notes.  Pertinent labs & imaging results that were available during my care of the patient were reviewed by me and considered in my medical decision making (see chart for details).   Patient is an ill-appearing 45 year old female here for evaluation of epigastric abdominal pain that radiates through to the back and has been associated with nausea and loose stool.  Patient's physical exam reveals a protuberant but soft abdomen with positive bowel sounds in all 4 quadrants.  Patient has tenderness in her right upper quadrant, epigastric and medial aspect of her left upper quadrant to palpation.  Patient has some guarding with palpation of the left upper quadrant.  Urine collected at triage shows greater than 1000 glucose with a small amount of blood and few bacteria.  No leukocyte esterase or nitrates were present and there ais no protein or ketones present.  Fingerstick glucose is 413.  Differential diagnosis include gallbladder disease versus gastroenteritis.  Will check CBC, CMP, and lipase.  We will also give patient 1 L normal saline IV to correct her blood sugar.  CBC shows a very mildly elevated RBC count of 5.35 but is otherwise unremarkable.  CMP shows mild hyponatremia and hypokalemia at 133 and 3.4 respectively.  Renal function is normal with a BUN of 9 and creatinine of 0.79.  Transaminases are normal and anion gap is 12.  Lipase is 45.  Without evidence of overt infection or gallbladder disease on blood work it is reasonable to assume that her GI issues are secondary to a viral process.  We will discharge patient home with Zofran for nausea and a diagnosis of gastroenteritis.  ER precautions reviewed with patient.   Final Clinical Impressions(s) / UC Diagnoses   Final diagnoses:  Noninfectious gastroenteritis, unspecified type     Discharge Instructions     Take the Zofran every 8 hours as needed for nausea.  Continue to work on pushing oral fluids to maintain hydration and keep  your bowel some rest.  I would recommend following a clear liquid diet for the next 12 to 24 hours.  Clear liquids consist of things like broth, Jell-O, ginger ale, water, and Pedialyte.  After 12 to 24 hours you can slowly start adding bland foods to your diet such as bananas, rice, applesauce, and toast.  If you develop any fever, increase in your abdominal pain, your abdomen starts to swell, or you start passing gas or stool you need to go to the ER for evaluation.    ED Prescriptions    Medication Sig Dispense Auth. Provider   ondansetron (ZOFRAN ODT) 8 MG disintegrating tablet Take 1 tablet (8 mg total) by mouth every 8 (eight) hours as needed for nausea or vomiting. 20 tablet Margarette Canada, NP     PDMP not reviewed this encounter.   Margarette Canada, NP 12/23/20 1526

## 2021-01-08 ENCOUNTER — Other Ambulatory Visit: Payer: Self-pay

## 2021-01-08 ENCOUNTER — Telehealth: Payer: Self-pay | Admitting: Primary Care

## 2021-01-08 DIAGNOSIS — E119 Type 2 diabetes mellitus without complications: Secondary | ICD-10-CM

## 2021-01-08 MED ORDER — BASAGLAR KWIKPEN 100 UNIT/ML ~~LOC~~ SOPN: 35.0000 [IU] | PEN_INJECTOR | Freq: Every day | SUBCUTANEOUS | 2 refills | Status: DC

## 2021-01-08 NOTE — Telephone Encounter (Signed)
Patient called in stating that her insurance company will not authorize the following prescription unless the script was written for 35 units instead of 10 units. Patient is out of the medication and needs it refilled asap. Please advise. EM   Medication insulin glargine (basaglar [employer])  Pharmacy [place], [place]

## 2021-01-08 NOTE — Telephone Encounter (Signed)
Called patient let know that has been refilled. She will call if any questions.

## 2021-01-09 ENCOUNTER — Other Ambulatory Visit: Payer: Self-pay | Admitting: Primary Care

## 2021-01-09 DIAGNOSIS — E119 Type 2 diabetes mellitus without complications: Secondary | ICD-10-CM

## 2021-01-09 MED ORDER — BASAGLAR KWIKPEN 100 UNIT/ML ~~LOC~~ SOPN: 40.0000 [IU] | PEN_INJECTOR | Freq: Every day | SUBCUTANEOUS | 1 refills | Status: DC

## 2021-01-12 ENCOUNTER — Other Ambulatory Visit: Payer: Self-pay

## 2021-01-12 ENCOUNTER — Ambulatory Visit: Payer: Commercial Managed Care - HMO | Admitting: Internal Medicine

## 2021-01-12 ENCOUNTER — Ambulatory Visit (INDEPENDENT_AMBULATORY_CARE_PROVIDER_SITE_OTHER): Payer: Commercial Managed Care - HMO | Admitting: Internal Medicine

## 2021-01-12 ENCOUNTER — Encounter: Payer: Self-pay | Admitting: Internal Medicine

## 2021-01-12 VITALS — BP 120/78 | HR 87 | Temp 97.9°F | Wt 164.0 lb

## 2021-01-12 DIAGNOSIS — R1012 Left upper quadrant pain: Secondary | ICD-10-CM

## 2021-01-12 DIAGNOSIS — K582 Mixed irritable bowel syndrome: Secondary | ICD-10-CM

## 2021-01-12 DIAGNOSIS — K2100 Gastro-esophageal reflux disease with esophagitis, without bleeding: Secondary | ICD-10-CM

## 2021-01-12 DIAGNOSIS — R1013 Epigastric pain: Secondary | ICD-10-CM | POA: Diagnosis not present

## 2021-01-12 DIAGNOSIS — K219 Gastro-esophageal reflux disease without esophagitis: Secondary | ICD-10-CM

## 2021-01-12 DIAGNOSIS — R109 Unspecified abdominal pain: Secondary | ICD-10-CM

## 2021-01-12 DIAGNOSIS — R140 Abdominal distension (gaseous): Secondary | ICD-10-CM | POA: Diagnosis not present

## 2021-01-12 DIAGNOSIS — E1165 Type 2 diabetes mellitus with hyperglycemia: Secondary | ICD-10-CM

## 2021-01-12 DIAGNOSIS — R110 Nausea: Secondary | ICD-10-CM

## 2021-01-12 DIAGNOSIS — Z794 Long term (current) use of insulin: Secondary | ICD-10-CM

## 2021-01-12 MED ORDER — PANTOPRAZOLE SODIUM 40 MG PO TBEC: 40.0000 mg | DELAYED_RELEASE_TABLET | Freq: Two times a day (BID) | ORAL | 0 refills | Status: DC

## 2021-01-12 NOTE — Progress Notes (Signed)
 Subjective:    Patient ID: Christine Cook, female    DOB: 04/07/1976, 44 y.o.   MRN: [id number]  HPI  Pt presents to the clinic today with c/o multiple complaints.  She reports LUQ pain. She reports this started 3 days ago. She reports this is the 3rd episode since October. She describes the pain as gnawing, and has been more constant. The pain radiates into the left side of her back. She reports associated abdominal bloating, increased thirst and urinary frequency. She is unsure if this pain is related to eating or not. She has a history GERD, taking Pantoprazole as prescribed. She has a history of IBS with alternating constipation and diarrhea. She has had an upper GI in 2005. She has had 2-3 UC visits for the same. She reports she had to leave work on Friday because of the abdominal pain, and would like a work note to return tomorrow.   She also reports hyperglycemia. She reports her fasting BS this am was 189, repeat was 404 after eating oatmeal. Her last A1C was 10.2, 10/2020. She is taking Basaglar, Metformin and Glipizide as prescribed. She reports today is the first day her fasting sugar has been < 200, since we increased her Basaglar to 40 units. She reports that her blood sugars are 350-400 after meals and she does not know why. She has been trying to do better with her diet and has lost about 6 lbs.   Review of Systems      Past Medical History:  Diagnosis Date  . Anxiety   . Benzodiazepine overdose   . Depression   . Diabetes (HCC)   . Fatty liver disease, nonalcoholic   . Heart murmur   . Heart palpitations   . High cholesterol   . 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 Hypertension   . Interstitial cystitis   . Irritable bowel syndrome with diarrhea   . 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 Migraine   . 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 Sleep apnea with hypersomnolence    CPAP study 11-28-09,  10-02-2009 AHI was 16.5, pressure to   .  Tachycardia     Current Outpatient Medications  Medication Sig Dispense Refill  . ARIPiprazole (ABILIFY) 10 MG tablet Take 10 mg by mouth daily.    . blood glucose meter kit and supplies KIT Dispense based on patient and insurance preference. Use up to four times daily as directed. (FOR ICD-9 250.00, 250.01). 1 each 0  . Cholecalciferol (VITAMIN D3) 2000 units TABS Take by mouth.    . Cyanocobalamin (B-12 PO) Take 1,000 mcg by mouth daily.    . glipiZIDE (GLUCOTROL) 10 MG tablet TAKE 1 TABLET BY MOUTH TWICE DAILY BEFORE A MEAL FOR DIABETES. 180 tablet 3  . Insulin Glargine (BASAGLAR KWIKPEN) 100 UNIT/ML Inject 40 Units into the skin at bedtime. For diabetes. 30 mL 1  . Insulin Pen Needle (PEN NEEDLES) 31G X 6 MM MISC Use nightly with insulin. 100 each 2  . levonorgestrel (MIRENA) 20 MCG/24HR IUD 1 each by Intrauterine route once.    . metFORMIN (GLUCOPHAGE-XR) 500 MG 24 hr tablet Take 1 tablet (500 mg total) by mouth daily with breakfast. For diabetes. 90 tablet 1  . metoprolol tartrate (LOPRESSOR) 25 MG tablet Take 1 tablet (25 mg total) by mouth 2 (two) times daily. For heart rate. 180 tablet 3  . ondansetron (ZOFRAN ODT) 8 MG disintegrating tablet Take 1 tablet (8   mg total) by mouth every 8 (eight) hours as needed for nausea or vomiting. 20 tablet 0  . pantoprazole (PROTONIX) 40 MG tablet Take 1 tablet (40 mg total) by mouth daily. For heartburn 90 tablet 0  . rosuvastatin (CRESTOR) 20 MG tablet Take 1 tablet (20 mg total) by mouth daily. For cholesterol. 90 tablet 3  . venlafaxine (EFFEXOR) 100 MG tablet Take 300 mg by mouth daily.     No current facility-administered medications for this visit.    Allergies  Allergen Reactions  . Codeine Hives and Nausea And Vomiting  . Morphine Hives  . Neurontin [Gabapentin] Other (See Comments)    syncope    Family History  Problem Relation Age of Onset  . Asthma Mother   . Hyperthyroidism Mother   . Rheum arthritis Mother   . Graves'  disease Mother   . Sjogren's syndrome Mother   . Migraines Mother   . Asthma Daughter   . Migraines Daughter   . Colon cancer Neg Hx     Social History   Socioeconomic History  . Marital status: Single    Spouse name: Not on file  . Number of children: 1  . Years of education: College  . Highest education level: Not on file  Occupational History  . Occupation: Support specialist    Employer: [employer]  Tobacco Use  . Smoking status: Former Smoker    Packs/day: 0.25    Years: 2.00    Pack years: 0.50    Types: Cigarettes    Quit date: 2017    Years since quitting: 5.2  . Smokeless tobacco: Never Used  Vaping Use  . Vaping Use: Never used  Substance and Sexual Activity  . Alcohol use: No  . Drug use: No  . Sexual activity: Yes    Birth control/protection: I.U.D.  Other Topics Concern  . Not on file  Social History Narrative   Single.   One daughter.   Works at [employer].   Enjoys relaxing, spending time with family.   Social Determinants of Health   Financial Resource Strain: Not on file  Food Insecurity: Not on file  Transportation Needs: Not on file  Physical Activity: Not on file  Stress: Not on file  Social Connections: Not on file  Intimate Partner Violence: Not on file     Constitutional: Denies fever, malaise, fatigue, headache or abrupt weight changes.  HEENT: Denies eye pain, eye redness, ear pain, ringing in the ears, wax buildup, runny nose, nasal congestion, bloody nose, or sore throat. Respiratory: Denies difficulty breathing, shortness of breath, cough or sputum production.   Cardiovascular: Denies chest pain, chest tightness, palpitations or swelling in the hands or feet.  Gastrointestinal: Pt reports increased thirst, epigastric pain, LUQ pain, left flank pain, bloating, alternating constipation and diarrhea.. Denies blood in the stool.  GU: Pt reports urinary frequency. Denies urgency, frequency, pain with urination, burning sensation,  blood in urine, odor or discharge.  No other specific complaints in a complete review of systems (except as listed in HPI above).  Objective:   Physical Exam  BP 120/78   Pulse 87   Temp 97.9 F (36.6 C) (Temporal)   Wt 164 lb (74.4 kg)   SpO2 98%   BMI 27.29 kg/m   Wt Readings from Last 3 Encounters:  12/23/20 170 lb (77.1 kg)  10/28/20 165 lb (74.8 kg)  10/03/20 165 lb (74.8 kg)    General: Appears her stated age, well developed, well nourished   in NAD. Skin: Warm, dry and intact. No rashes noted. Neck:  Neck supple, trachea midline. No masses, lumps or thyromegaly present.  Cardiovascular: Normal rate and rhythm. S1,S2 noted.  No murmur, rubs or gallops noted.  Pulmonary/Chest: Normal effort and positive vesicular breath sounds. No respiratory distress. No wheezes, rales or ronchi noted.  Abdomen: Soft and tender in the epigastric, LUQ and RLQ. Normal bowel sounds. No CVA tenderness noted. Neurological: Alert and oriented.    BMET    Component Value Date/Time   NA 133 (L) 12/23/2020 1445   NA 143 05/11/2017 0940   K 3.4 (L) 12/23/2020 1445   CL 98 12/23/2020 1445   CO2 23 12/23/2020 1445   GLUCOSE 439 (H) 12/23/2020 1445   BUN 9 12/23/2020 1445   BUN 6 05/11/2017 0940   CREATININE 0.79 12/23/2020 1445   CALCIUM 9.3 12/23/2020 1445   GFRNONAA >60 12/23/2020 1445   GFRAA >60 05/27/2020 1503    Lipid Panel     Component Value Date/Time   CHOL 310 (H) 10/28/2020 0758   CHOL 186 02/09/2017 1016   TRIG (H) 10/28/2020 0758    871.0 Triglyceride is over 400; calculations on Lipids are invalid.   HDL 36.90 (L) 10/28/2020 0758   HDL 30 (L) 02/09/2017 1016   CHOLHDL 8 10/28/2020 0758   VLDL 65.6 (H) 01/29/2019 0819   LDLCALC 83 02/09/2017 1016    CBC    Component Value Date/Time   WBC 9.3 12/23/2020 1445   RBC 5.35 (H) 12/23/2020 1445   HGB 14.4 12/23/2020 1445   HGB 12.3 02/09/2017 1016   HCT 43.8 12/23/2020 1445   HCT 37.5 02/09/2017 1016   PLT 285  12/23/2020 1445   PLT 285 11/25/2016 1856   MCV 81.9 12/23/2020 1445   MCV 82 02/09/2017 1016   MCH 26.9 12/23/2020 1445   MCHC 32.9 12/23/2020 1445   RDW 14.3 12/23/2020 1445   RDW 15.4 02/09/2017 1016   LYMPHSABS 1.9 12/23/2020 1445   LYMPHSABS 2.0 02/09/2017 1016   MONOABS 0.7 12/23/2020 1445   EOSABS 0.4 12/23/2020 1445   EOSABS 0.3 02/09/2017 1016   BASOSABS 0.0 12/23/2020 1445   BASOSABS 0.0 02/09/2017 1016    Hgb A1C Lab Results  Component Value Date   HGBA1C 10.2 (A) 10/28/2020            Assessment & Plan:   Epigastric Pain, LUQ Pain, Left Flank Pain, Nausea, GERD, IBS with Alternating Constipation and Diarrhea:  Will check CMET, Amylase, Lipase and H Pylori Increase Pantoprazole to 40 mg BID, RX sent to pharmacy Referral to GI for possible upper endoscopy Consider d/w psych about holding/discontinuing psych as this is known to cause GI issues  DM 2, Uncontrolled on Insulin:  Discussed low carb diet and exercising for weight loss She could benefit from seeing a nutritionist She is too early for A1C, not due until 4/26 Will check fructosamine, free and total insulin levels Abilify contributing to elevated glucose readings  Will follow up after labs, return precautions discussed   , NP This visit occurred during the SARS-CoV-2 public health emergency.  Safety protocols were in place, including screening questions prior to the visit, additional usage of staff PPE, and extensive cleaning of exam room while observing appropriate contact time as indicated for disinfecting solutions.    

## 2021-01-13 DIAGNOSIS — K859 Acute pancreatitis without necrosis or infection, unspecified: Secondary | ICD-10-CM | POA: Insufficient documentation

## 2021-01-13 DIAGNOSIS — K861 Other chronic pancreatitis: Secondary | ICD-10-CM | POA: Insufficient documentation

## 2021-01-13 LAB — LIPASE: Lipase: 359 U/L — ABNORMAL HIGH (ref 11.0–59.0)

## 2021-01-13 LAB — COMPREHENSIVE METABOLIC PANEL
ALT: 15 U/L (ref 0–35)
AST: 20 U/L (ref 0–37)
Albumin: 4 g/dL (ref 3.5–5.2)
Alkaline Phosphatase: 116 U/L (ref 39–117)
BUN: 9 mg/dL (ref 6–23)
CO2: 27 mEq/L (ref 19–32)
Calcium: 9.2 mg/dL (ref 8.4–10.5)
Chloride: 99 mEq/L (ref 96–112)
Creatinine, Ser: 1.03 mg/dL (ref 0.40–1.20)
GFR: 65.62 mL/min (ref >60.00)
Glucose, Bld: 299 mg/dL — ABNORMAL HIGH (ref 70–99)
Potassium: 3.4 mEq/L — ABNORMAL LOW (ref 3.5–5.1)
Sodium: 139 mEq/L (ref 135–145)
Total Bilirubin: 0.3 mg/dL (ref 0.2–1.2)
Total Protein: 6.6 g/dL (ref 6.0–8.3)

## 2021-01-13 LAB — AMYLASE: Amylase: 36 U/L (ref 27–131)

## 2021-01-15 LAB — FRUCTOSAMINE: Fructosamine: 347 umol/L — ABNORMAL HIGH (ref 205–285)

## 2021-01-19 ENCOUNTER — Telehealth: Payer: Self-pay | Admitting: *Deleted

## 2021-01-19 NOTE — Telephone Encounter (Signed)
Please call patient:  I think it's okay for her to wait until Thursday, I reviewed her last set of labs from the hospital and they appeared improved. Abdominal pain with pancreatitis will remain and gradually improve.. As long as her pain isn't worse then she should be fine.   Have her monitor glucose levels, bring in log sheet for me to review during her visit.

## 2021-01-19 NOTE — Telephone Encounter (Signed)
Pt called into the schedule line requesting a hospital f/u with PCP. 1st available with PCP was Thursday 01/22/21. I did schedule that appt but pt thinks she needs to be seen sooner. Pt wanted to see someone else sooner, I didn't know if it was appropriate to schedule with another provider given the limited appts we have this week. So will route to PCP to see if pt is okay to wait till Thursday or if PCP can work her in sooner, or if she should see another provider sooner. I did ask if pt had any major concerns that she need to see PCP sooner. Pt said "not really" her only concern is she is still having abd pain, but didn't know if that's normal and will just take time to resolve.  I advise pt I would send message to PCP and her assistant will f/u with her.

## 2021-01-20 NOTE — Telephone Encounter (Signed)
Pt aware. Will keep appt for 01/22/21

## 2021-01-20 NOTE — Telephone Encounter (Signed)
Left message to return call to our office.  

## 2021-01-22 ENCOUNTER — Other Ambulatory Visit: Payer: Self-pay

## 2021-01-22 ENCOUNTER — Encounter: Payer: Self-pay | Admitting: Primary Care

## 2021-01-22 ENCOUNTER — Ambulatory Visit (INDEPENDENT_AMBULATORY_CARE_PROVIDER_SITE_OTHER): Payer: Commercial Managed Care - HMO | Admitting: Primary Care

## 2021-01-22 VITALS — BP 116/78 | HR 102 | Temp 98.2°F | Ht 65.0 in | Wt 162.5 lb

## 2021-01-22 DIAGNOSIS — E1165 Type 2 diabetes mellitus with hyperglycemia: Secondary | ICD-10-CM

## 2021-01-22 DIAGNOSIS — Z794 Long term (current) use of insulin: Secondary | ICD-10-CM | POA: Diagnosis not present

## 2021-01-22 DIAGNOSIS — K859 Acute pancreatitis without necrosis or infection, unspecified: Secondary | ICD-10-CM | POA: Diagnosis not present

## 2021-01-22 DIAGNOSIS — E119 Type 2 diabetes mellitus without complications: Secondary | ICD-10-CM

## 2021-01-22 DIAGNOSIS — E785 Hyperlipidemia, unspecified: Secondary | ICD-10-CM | POA: Diagnosis not present

## 2021-01-22 LAB — HEMOGLOBIN A1C: Hgb A1c MFr Bld: 10.7 % — ABNORMAL HIGH (ref 4.6–6.5)

## 2021-01-22 LAB — HEPATIC FUNCTION PANEL
ALT: 293 U/L — ABNORMAL HIGH (ref 0–35)
AST: 67 U/L — ABNORMAL HIGH (ref 0–37)
Albumin: 3.8 g/dL (ref 3.5–5.2)
Alkaline Phosphatase: 504 U/L — ABNORMAL HIGH (ref 39–117)
Bilirubin, Direct: 0.4 mg/dL — ABNORMAL HIGH (ref 0.0–0.3)
Total Bilirubin: 1 mg/dL (ref 0.2–1.2)
Total Protein: 7.2 g/dL (ref 6.0–8.3)

## 2021-01-22 LAB — LIPID PANEL
Cholesterol: 152 mg/dL (ref 0–200)
HDL: 21 mg/dL — ABNORMAL LOW (ref >39.00)
NonHDL: 130.73
Total CHOL/HDL Ratio: 7
Triglycerides: 308 mg/dL — ABNORMAL HIGH (ref 0.0–149.0)
VLDL: 61.6 mg/dL — ABNORMAL HIGH (ref 0.0–40.0)

## 2021-01-22 LAB — LIPASE: Lipase: 35 U/L (ref 11.0–59.0)

## 2021-01-22 LAB — LDL CHOLESTEROL, DIRECT: Direct LDL: 102 mg/dL

## 2021-01-22 MED ORDER — TRAMADOL HCL 50 MG PO TABS: 50.0000 mg | ORAL_TABLET | Freq: Three times a day (TID) | ORAL | 0 refills | Status: AC | PRN

## 2021-01-22 NOTE — Assessment & Plan Note (Signed)
Likely remains uncontrolled given ongoing hyperglycemia and recent pancreatitis. Repeat A1C pending.  Increase basaglar insulin to 42 units. Continue Metformin XR 500 mg daily and Glipizide 10 mg BID.   She will monitor glucose readings and update.

## 2021-01-22 NOTE — Assessment & Plan Note (Signed)
Compliant to Crestor 20 mg daily, continue same. Repeat lipid panel pending.  

## 2021-01-22 NOTE — Progress Notes (Signed)
Subjective:    Patient ID: Christine Cook, female    DOB: [birth year], 45 y.o.   MRN: [id number]  HPI  Christine Cook is a very pleasant 45 y.o. female with a history of uncontrolled type 2 diabetes, hypertension, OSA, hyperlipidemia, acute pancreatitis who presents today for hospital follow up.  Patient returned for follow up with me in January 2022 after nearly 2 years of no follow up or treatment. During this visit diabetes was noted to be uncontrolled so treatment was resumed. She had been sending my chart messages with report of continued hyperglycemia so we were tweaking the dose of her insulin.  She was evaluated by Webb Silversmith on 01/12/21 with reports of epigastric pain and hyperglycemia. Work up completed which revealed acute pancreatitis.   She presented to [facility] ED on 01/13/21 for continued epigastric pain. 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 She was admitted for treatment.  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 She underwent ERCP x 2, second attempt successful for stone removal, stent placed. Symptoms improved so she was discharged home on 01/16/21 with recommendation for GI follow up on 02/14/21 for repeat ERCP and stent removal. She was initiated on Eliquis 5 mg BID for portal vein blood clot.   Since her discharge home she's feeling some better. Her blood sugars are ranging in the 170's-180's. She is injecting 40 units of Insulin. She is mostly eating a bland and clear liquid diet due to continued epigastric abdominal pain. She denies fevers. She does experience cold sweats.    She continues to experience intermittent pain flares that occur with eating. She's tried taking Tylenol at home without improvement. She has been out of work since her hospital stay. She attempted to return to work on April 19th,  last a few hours before she experienced a significant amount of pain and fatigue. She is needing a work note excuse for April 8th through April 27th with a return to work date of April 28th.    Review of Systems  Constitutional: Negative for fever.  Gastrointestinal: Positive for abdominal pain, constipation and nausea. Negative for blood in stool, diarrhea and vomiting.         Past Medical History:  Diagnosis Date  . Anxiety   . Benzodiazepine overdose   . Depression   . Diabetes ([place])   . Fatty liver disease, nonalcoholic   . Heart murmur   . Heart palpitations   . High cholesterol   . 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 Hypertension   . Interstitial cystitis   . Irritable bowel syndrome with diarrhea   . 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 Migraine   . 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 Sleep apnea with hypersomnolence    CPAP study 11-28-09,  10-02-2009 AHI was 16.5, pressure to   . Tachycardia     Social History   Socioeconomic History  . Marital status: Single    Spouse name: Not on file  . Number of children: 1  . Years of education: College  . Highest education level: Not on file  Occupational History  . 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 Smoking status: Former Smoker    Packs/day: 0.25    Years: 2.00    Pack years: 0.50    Types:  Cigarettes    Quit date: 2017    Years since quitting: 5.3  . Smokeless tobacco: Never Used  Vaping Use  . Vaping Use: Never used  Substance and Sexual Activity  . Alcohol use: No  . Drug use: No  . Sexual activity: Yes    Birth control/protection: I.U.D.  Other Topics Concern  . Not on file  Social History Narrative   Single.   One daughter.   Works at [employer].   Enjoys relaxing, spending time with family.   Social Determinants of Health   Financial Resource Strain: Not on file  Food Insecurity: Not on file  Transportation Needs: Not on  file  Physical Activity: Not on file  Stress: Not on file  Social Connections: Not on file  Intimate Partner Violence: Not on file    Past Surgical History:  Procedure Laterality Date  . ADENOIDECTOMY    . BLADDER SURGERY    . CHOLECYSTECTOMY    . KNEE SURGERY     Left knee x 2   . LEEP  10/2016   CIN 1  . TONSILLECTOMY      Family History  Problem Relation Age of Onset  . Asthma Mother   . Hyperthyroidism Mother   . Rheum arthritis Mother   . Graves' disease Mother   . Sjogren's syndrome Mother   . Migraines Mother   . Asthma Daughter   . Migraines Daughter   . Colon cancer Neg Hx     Allergies  Allergen Reactions  . Codeine Hives and Nausea And Vomiting  . Morphine Hives  . Neurontin [Gabapentin] Other (See Comments)    syncope    Current Outpatient Medications on File Prior to Visit  Medication Sig Dispense Refill  . acetaminophen (TYLENOL) 500 MG tablet Take by mouth.    Marland Kitchen apixaban (ELIQUIS) 5 MG TABS tablet Take 2 tablets (41m) by mouth twice daily for 7 days, followed by 1 tablet (517m by mouth twice daily for a total of 3 months    . ARIPiprazole (ABILIFY) 10 MG tablet Take 10 mg by mouth daily.    . blood glucose meter kit and supplies KIT Dispense based on patient and insurance preference. Use up to four times daily as directed. (FOR ICD-9 250.00, 250.01). 1 each 0  . Cholecalciferol (VITAMIN D3) 2000 units TABS Take by mouth.    . Cyanocobalamin (B-12 PO) Take 1,000 mcg by mouth daily.    . Marland KitchenlipiZIDE (GLUCOTROL) 10 MG tablet TAKE 1 TABLET BY MOUTH TWICE DAILY BEFORE A MEAL FOR DIABETES. 180 tablet 3  . Insulin Glargine (BASAGLAR KWIKPEN) 100 UNIT/ML Inject 40 Units into the skin at bedtime. For diabetes. 30 mL 1  . Insulin Pen Needle (PEN NEEDLES) 31G X 6 MM MISC Use nightly with insulin. 100 each 2  . Lancets (ONETOUCH DELICA PLUS [id number] SMARTSIG:Topical 1 to 4 Times Daily    . levonorgestrel (MIRENA) 20 MCG/24HR IUD 1 each by Intrauterine  route once.    . metFORMIN (GLUCOPHAGE-XR) 500 MG 24 hr tablet Take 1 tablet (500 mg total) by mouth daily with breakfast. For diabetes. 90 tablet 1  . metoprolol tartrate (LOPRESSOR) 25 MG tablet Take 1 tablet (25 mg total) by mouth 2 (two) times daily. For heart rate. 180 tablet 3  . ondansetron (ZOFRAN ODT) 8 MG disintegrating tablet Take 1 tablet (8 mg total) by mouth every 8 (eight) hours as needed for nausea or vomiting. 20 tablet 0  . ONETOUCH ULTRA test strip  SMARTSIG:Via Meter 1 to 4 Times Daily    . pantoprazole (PROTONIX) 40 MG tablet Take 1 tablet (40 mg total) by mouth 2 (two) times daily. For heartburn 180 tablet 0  . rosuvastatin (CRESTOR) 20 MG tablet Take 1 tablet (20 mg total) by mouth daily. For cholesterol. 90 tablet 3  . venlafaxine (EFFEXOR) 100 MG tablet Take 300 mg by mouth daily.    . [DISCONTINUED] atorvastatin (LIPITOR) 80 MG tablet Take 1 tablet by mouth every evening for cholesterol. 90 tablet 2  . [DISCONTINUED] diphenhydrAMINE (BENADRYL) 25 mg capsule Take 2 capsules (50 mg total) by mouth every 6 (six) hours as needed. 60 capsule 0  . [DISCONTINUED] metoCLOPramide (REGLAN) 10 MG tablet Take 1 tablet (10 mg total) by mouth every 6 (six) hours as needed. 30 tablet 0  . [DISCONTINUED] potassium chloride SA (K-DUR) 20 MEQ tablet Take 1 tablet by mouth once daily 90 tablet 1  . [DISCONTINUED] QUEtiapine (SEROQUEL XR) 50 MG TB24 24 hr tablet Take 1 tablet (50 mg total) by mouth at bedtime.     No current facility-administered medications on file prior to visit.    BP 116/78   Pulse (!) 102   Temp 98.2 F (36.8 C) (Oral)   Ht '5\' 5"'  (1.651 m)   Wt 162 lb 8 oz (73.7 kg)   SpO2 98%   BMI 27.04 kg/m  Objective:   Physical Exam Cardiovascular:     Rate and Rhythm: Normal rate and regular rhythm.  Pulmonary:     Effort: Pulmonary effort is normal.     Breath sounds: Normal breath sounds.  Abdominal:     Tenderness: There is abdominal tenderness in the epigastric  area and left upper quadrant. There is no guarding.  Musculoskeletal:     Cervical back: Neck supple.  Skin:    General: Skin is warm and dry.           Assessment & Plan:      This visit occurred during the SARS-CoV-2 public health emergency.  Safety protocols were in place, including screening questions prior to the visit, additional usage of staff PPE, and extensive cleaning of exam room while observing appropriate contact time as indicated for disinfecting solutions.

## 2021-01-22 NOTE — Assessment & Plan Note (Signed)
Recent hospital admission to [facility], notes/labs/imaging reviewed.  She will follow up with GI for stent removal. Rx for Tramadol provided to use PRN.  Continue bland diet. Advance as tolerated.  Work note provided for return date of April 28th. Repeat lipase pending.

## 2021-01-22 NOTE — Patient Instructions (Addendum)
Stop by the lab prior to leaving today. I will notify you of your results once received.   Increase your Lantus to 42 units daily.  Monitor your blood sugars and update me as discussed.  Follow up with GI when scheduled.   You may take the Tramadol every 8 hours as needed for severe pain.  It was a pleasure to see you today!

## 2021-01-27 ENCOUNTER — Ambulatory Visit: Payer: Commercial Managed Care - HMO | Admitting: Primary Care

## 2021-02-04 ENCOUNTER — Other Ambulatory Visit: Payer: Self-pay | Admitting: Primary Care

## 2021-02-21 ENCOUNTER — Other Ambulatory Visit: Payer: Self-pay | Admitting: Primary Care

## 2021-02-21 DIAGNOSIS — K219 Gastro-esophageal reflux disease without esophagitis: Secondary | ICD-10-CM

## 2021-03-24 DIAGNOSIS — E119 Type 2 diabetes mellitus without complications: Secondary | ICD-10-CM

## 2021-03-26 ENCOUNTER — Ambulatory Visit
Admission: RE | Admit: 2021-03-26 | Discharge: 2021-03-26 | Disposition: A | Discharge status: Home / Self Care | Payer: Commercial Managed Care - HMO | Source: Ambulatory Visit | Attending: Emergency Medicine | Admitting: Emergency Medicine

## 2021-03-26 ENCOUNTER — Other Ambulatory Visit: Payer: Self-pay

## 2021-03-26 VITALS — BP 139/99 | HR 110 | Temp 98.4°F | Resp 18 | Ht 65.0 in | Wt 168.0 lb

## 2021-03-26 DIAGNOSIS — R220 Localized swelling, mass and lump, head: Secondary | ICD-10-CM | POA: Diagnosis not present

## 2021-03-26 MED ORDER — DOXYCYCLINE HYCLATE 100 MG PO CAPS: 100.0000 mg | ORAL_CAPSULE | Freq: Two times a day (BID) | ORAL | 0 refills | Status: DC

## 2021-03-26 NOTE — ED Triage Notes (Signed)
Pt reports having R sided facial swelling x1 week. Sts she think she have an abscess. Have been using amoxicillin and it has not helped.

## 2021-03-26 NOTE — ED Provider Notes (Signed)
Chief Complaint   Chief Complaint  Patient presents with   Facial Swelling     Subjective, HPI  Christine Cook is a very pleasant 45 y.o. female who presents with right sided facial swelling which started about a week ago.  Patient states that she has taken an old amoxicillin prescription over the last week which has not seemed to help.  Patient reports no pain to her teeth, painful chewing, changes with dentition such as broken teeth.  She also does not report any drainage to her face or inner aspect of her right cheek.  She denies any fever, chills or visual changes.  History obtained from patient.   Patient's problem list, past medical and social history, medications, and allergies were reviewed by me and updated in Epic.    ROS  See HPI.  Objective   Vitals:   03/26/21 1348  BP: (!) 139/99  Pulse: (!) 110  Resp: 18  Temp: 98.4 F (36.9 C)  SpO2: 99%    Vital signs and nursing note reviewed.  General: Appears well-developed and well-nourished. No acute distress.  HEENT: Normocephalic, atraumatic, hearing grossly intact. EOMI, no drainage. No rhinorrhea. Moist mucous membranes.  Neck: Normal range of motion, neck is supple.  Cardiovascular: Normal rate.  Pulm/Chest: No respiratory distress.   Musculoskeletal: No joint deformity, normal range of motion.  Skin: Moderate swelling noted to the right cheek about the maxillary sinus region.  Patient endorses mild TTP over maxillary sinus.  No abscess noted.  Data  No results found for any visits on 03/26/21.    Assessment & Plan  Right facial swelling  Meds ordered this encounter  Medications   doxycycline (VIBRAMYCIN) 100 MG capsule    Sig: Take 1 capsule (100 mg total) by mouth 2 (two) times daily for 10 days.    Dispense:  20 capsule    Refill:  0    Order Specific Question:   Supervising Provider    Answer:   Chase Picket [[id number]]      45 y.o. female presents with right sided facial swelling which  started about a week ago.  Patient states that she has taken an old amoxicillin prescription over the last week which has not seemed to help.  Patient reports no pain to her teeth, painful chewing, changes with dentition such as broken teeth.  She also does not report any drainage to her face or inner aspect of her right cheek.  She denies any fever, chills or visual changes.  Chart review completed.  Given symptoms along with assessment findings, potential for abscess versus infection of right maxillary sinus.  Rx'd doxycycline to the patient's preferred pharmacy and advised about home treatment and care as outlined in her AVS.  Patient stable.  Advised to follow-up with PCP within the next 1 week for recheck if symptoms do not improve.  Patient verbalized understanding and agreed with plan.  Plan:   Discharge Instructions      Take antibiotic with food as prescribed. Apply warm compresses to promote drainage. You may take 600 mg of ibuprofen every 6-8 hours as needed to help with inflammation as long as this medication is not contraindicated to any current health conditions. Expect it to gradually improve over 7 - 10 days.  Watch for signs of worsening infection: increased redness, swelling, red streaking, fever, or worsening pain. If these symptoms are present, or if you have new or concerning symptoms, return to clinic immediately for a recheck.  Serafina Royals, FNP-C 03/26/21   This note was partially made with the aid of speech-to-text dictation; typographical errors are not intentional.    Serafina Royals, [place] 03/26/21 1421

## 2021-03-26 NOTE — Discharge Instructions (Addendum)
Take antibiotic with food as prescribed. Apply warm compresses to promote drainage. You may take 600 mg of ibuprofen every 6-8 hours as needed to help with inflammation as long as this medication is not contraindicated to any current health conditions. Expect it to gradually improve over 7 - 10 days.  Watch for signs of worsening infection: increased redness, swelling, red streaking, fever, or worsening pain. If these symptoms are present, or if you have new or concerning symptoms, return to clinic immediately for a recheck.

## 2021-03-30 ENCOUNTER — Ambulatory Visit
Admission: RE | Admit: 2021-03-30 | Discharge: 2021-03-30 | Disposition: A | Discharge status: Home / Self Care | Payer: Commercial Managed Care - HMO | Source: Ambulatory Visit | Attending: Family Medicine | Admitting: Family Medicine

## 2021-03-30 ENCOUNTER — Other Ambulatory Visit: Payer: Self-pay

## 2021-03-30 VITALS — BP 148/103 | HR 99 | Temp 98.3°F | Resp 16

## 2021-03-30 DIAGNOSIS — K047 Periapical abscess without sinus: Secondary | ICD-10-CM

## 2021-03-30 MED ORDER — CLINDAMYCIN HCL 150 MG PO CAPS: 450.0000 mg | ORAL_CAPSULE | Freq: Three times a day (TID) | ORAL | 0 refills | Status: AC

## 2021-03-30 NOTE — ED Triage Notes (Signed)
Patient presents to Urgent Care for a follow-up from 06/23 visit. She states no improvement with doxy antibiotic,   Denies fever.

## 2021-03-30 NOTE — Discharge Instructions (Addendum)
If no improvement in 48 hours go to the ER.  Call a local dentist. You need to be see as soon as possible.  Antibiotic as prescribed.  Take care  Dr. Lacinda Axon

## 2021-03-31 NOTE — ED Provider Notes (Signed)
[facility]    CSN: [id number] Arrival date & time: 03/30/21  1147  History   Chief Complaint Chief Complaint  Patient presents with   Appointment    Appt 12044    HPI  45 year old female presents with continued facial swelling.   Recently seen on 6/23. Treated with Doxy. Continues to have right sided facial swelling. No fever. No relieving factors. No current dental pain. No respiratory symptoms. No other complaints.    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   Sleep apnea with hypersomnolence    CPAP study 11-28-09,  10-02-2009 AHI was 16.5, pressure to    Tachycardia     Patient Active Problem List   Diagnosis Date Noted   Acute pancreatitis 01/22/2021   Type 2 diabetes mellitus with hyperglycemia, with long-term current use of insulin ([place]) 01/22/2021   Screening for cervical cancer 09/11/2019   Arthralgia of right temporomandibular joint 02/15/2019   Plantar fasciitis 01/02/2018   Back pain 01/02/2018   Hypokalemia 11/29/2017   Cervical dysplasia 12/01/2016   Hyperlipidemia 08/05/2016   Tachycardia 08/05/2016   OSA (obstructive sleep apnea) 07/12/2016   Narcolepsy 07/12/2016   Vitamin B12 deficiency 07/12/2016   Vitamin D deficiency 07/12/2016   MDD (major depressive disorder) 07/11/2016   Impulse control disorder (gambling) 07/11/2016   Essential hypertension 07/09/2016   GERD 01/21/2009    Past Surgical History:  Procedure Laterality Date   ADENOIDECTOMY     BLADDER SURGERY     CHOLECYSTECTOMY     KNEE SURGERY     Left knee x 2    LEEP   10/2016   CIN 1   TONSILLECTOMY      OB History   No obstetric history on file.      Home Medications    Prior to Admission medications   Medication Sig Start Date End Date Taking? Authorizing Provider  clindamycin (CLEOCIN) 150 MG capsule Take 3 capsules (450 mg total) by mouth 3 (three) times daily for 7 days. 03/30/21 04/06/21 Yes Heraclio Seidman G, DO  acetaminophen (TYLENOL) 500 MG tablet Take by mouth. 01/16/21   [provider]  apixaban (ELIQUIS) 5 MG TABS tablet Take 2 tablets (95m) by mouth twice daily for 7 days, followed by 1 tablet (555m by mouth twice daily for a total of 3 months 01/16/21   [provider]  ARIPiprazole (ABILIFY) 10 MG tablet Take 10 mg by mouth daily.    [provider]  blood glucose meter kit and supplies KIT Dispense based on patient and insurance preference. Use up to four times daily as directed. (FOR ICD-9 250.00, 250.01). 10/28/20   ClPleas KochNP  Cholecalciferol (VITAMIN D3) 2000 units TABS Take by mouth.    [provider]  Cyanocobalamin (B-12 PO) Take 1,000 mcg by mouth daily.    [provider]  glipiZIDE (GLUCOTROL) 10 MG tablet TAKE 1 TABLET BY MOUTH TWICE DAILY BEFORE A MEAL FOR DIABETES. 10/28/20   ClAlma Friendly  K, NP  Insulin Glargine (BASAGLAR KWIKPEN) 100 UNIT/ML Inject 40 Units into the skin at bedtime. For diabetes. 01/09/21   Pleas Koch, NP  Insulin Pen Needle (PEN NEEDLES) 31G X 6 MM MISC Use nightly with insulin. 10/28/20   Pleas Koch, NP  Lancets [facility] DELICA PLUS [id number]) [place] SMARTSIG:Topical 1 to 4 Times Daily 10/29/20   [provider]  levonorgestrel (MIRENA) 20 MCG/24HR IUD 1 each by Intrauterine route once. 01/16/14   Hassell Done, FNP  metFORMIN (GLUCOPHAGE-XR) 500 MG 24 hr tablet Take 1 tablet (500 mg total) by mouth daily with breakfast. For diabetes. 10/28/20   Pleas Koch, NP  metoprolol tartrate (LOPRESSOR) 25 MG tablet Take 1 tablet (25  mg total) by mouth 2 (two) times daily. For heart rate. 10/28/20   Pleas Koch, NP  ondansetron (ZOFRAN ODT) 8 MG disintegrating tablet Take 1 tablet (8 mg total) by mouth every 8 (eight) hours as needed for nausea or vomiting. 12/23/20   Margarette Canada, NP  [facility] ULTRA test strip USE TO CHECK BLOOD SUGAR UP TO FOUR TIMES DAILY AS DIRECTED 02/05/21   Pleas Koch, NP  pantoprazole (PROTONIX) 40 MG tablet TAKE 1 TABLET BY MOUTH ONCE DAILY FOR HEARTBURN 02/24/21   Pleas Koch, NP  rosuvastatin (CRESTOR) 20 MG tablet Take 1 tablet (20 mg total) by mouth daily. For cholesterol. 10/29/20   Pleas Koch, NP  venlafaxine (EFFEXOR) 100 MG tablet Take 300 mg by mouth daily.    [provider]  atorvastatin (LIPITOR) 80 MG tablet Take 1 tablet by mouth every evening for cholesterol. 03/26/19 08/06/20  Pleas Koch, NP  diphenhydrAMINE (BENADRYL) 25 mg capsule Take 2 capsules (50 mg total) by mouth every 6 (six) hours as needed. 04/07/20 08/06/20  Carrie Mew, MD  metoCLOPramide (REGLAN) 10 MG tablet Take 1 tablet (10 mg total) by mouth every 6 (six) hours as needed. 04/07/20 08/06/20  Carrie Mew, MD  potassium chloride SA (K-DUR) 20 MEQ tablet Take 1 tablet by mouth once daily 04/12/19 08/06/20  Pleas Koch, NP  QUEtiapine (SEROQUEL XR) 50 MG TB24 24 hr tablet Take 1 tablet (50 mg total) by mouth at bedtime. 07/09/19 08/06/20  Tonia Ghent, MD    Family History Family History  Problem Relation Age of Onset   Asthma Mother    Hyperthyroidism Mother    Rheum arthritis Mother    Graves' disease Mother    Sjogren's syndrome Mother    Migraines Mother    Asthma Daughter    Migraines Daughter    Colon cancer Neg Hx     Social History Social History   Tobacco Use   Smoking status: Former    Packs/day: 0.25    Years: 2.00    Pack years: 0.50    Types: Cigarettes    Quit date: 2017    Years since quitting: 5.4   Smokeless tobacco: Never  Vaping Use    Vaping Use: Never used  Substance Use Topics   Alcohol use: No   Drug use: No     Allergies   Codeine, Morphine, and Neurontin [gabapentin]   Review of Systems Review of Systems  Constitutional:  Negative for fever.  HENT:  Positive for facial swelling.     Physical Exam Triage Vital Signs ED Triage Vitals  Enc Vitals Group     BP 03/30/21 1230 (!) 148/103     Pulse Rate 03/30/21 1230 99     Resp  03/30/21 1230 16     Temp 03/30/21 1230 98.3 F (36.8 C)     Temp Source 03/30/21 1230 Oral     SpO2 03/30/21 1230 100 %     Weight --      Height --      Head Circumference --      Peak Flow --      Pain Score 03/30/21 1330 0     Pain Loc --      Pain Edu? --      Excl. in [place]? --    No data found.  Updated Vital Signs BP (!) 148/103 (BP Location: Left Arm)   Pulse 99   Temp 98.3 F (36.8 C) (Oral)   Resp 16   SpO2 100%   Visual Acuity Right Eye Distance:   Left Eye Distance:   Bilateral Distance:    Right Eye Near:   Left Eye Near:    Bilateral Near:     Physical Exam Vitals and nursing note reviewed.  Constitutional:      General: She is not in acute distress. HENT:     Head: Normocephalic and atraumatic.     Nose:     Comments: Right sided facial swelling noted.     Mouth/Throat:     Comments: Very poor dentition throughout with multiple broken, cracked, and decayed teeth.  Cardiovascular:     Rate and Rhythm: Normal rate and regular rhythm.  Pulmonary:     Effort: Pulmonary effort is normal.     Breath sounds: Normal breath sounds.  Neurological:     Mental Status: She is alert.     UC Treatments / Results  Labs (all labs ordered are listed, but only abnormal results are displayed) Labs Reviewed - No data to display  EKG   Radiology No results found.  Procedures Procedures (including critical care time)  Medications Ordered in UC Medications - No data to display  Initial Impression / Assessment and Plan / UC Course  I have  reviewed the triage vital signs and the nursing notes.  Pertinent labs & imaging results that were available during my care of the patient were reviewed by me and considered in my medical decision making (see chart for details).    45 year old female presents with right sided facial swelling. She has very poor dentition throughout. I suspect that this is a dental abscess. Treating with Clindamycin.  Final Clinical Impressions(s) / UC Diagnoses   Final diagnoses:  Dental abscess     Discharge Instructions      If no improvement in 48 hours go to the ER.  Call a local dentist. You need to be see as soon as possible.  Antibiotic as prescribed.  Take care  Dr. Lacinda Axon    ED Prescriptions     Medication Sig Dispense Auth. Provider   clindamycin (CLEOCIN) 150 MG capsule Take 3 capsules (450 mg total) by mouth 3 (three) times daily for 7 days. 63 capsule Thersa Salt G, DO      PDMP not reviewed this encounter.   Coral Spikes, [place] 03/31/21 2240

## 2021-04-01 MED ORDER — BASAGLAR KWIKPEN 100 UNIT/ML ~~LOC~~ SOPN: PEN_INJECTOR | SUBCUTANEOUS | 2 refills | Status: DC

## 2021-04-20 DIAGNOSIS — E1165 Type 2 diabetes mellitus with hyperglycemia: Secondary | ICD-10-CM

## 2021-04-20 DIAGNOSIS — Z794 Long term (current) use of insulin: Secondary | ICD-10-CM

## 2021-04-21 ENCOUNTER — Ambulatory Visit: Payer: Commercial Managed Care - HMO

## 2021-04-21 NOTE — Telephone Encounter (Signed)
Also see where patient was seen at urgent care for abscess tooth in the last month

## 2021-04-22 ENCOUNTER — Ambulatory Visit: Payer: Commercial Managed Care - HMO

## 2021-04-24 DIAGNOSIS — K8689 Other specified diseases of pancreas: Secondary | ICD-10-CM | POA: Insufficient documentation

## 2021-04-24 HISTORY — DX: Other specified diseases of pancreas: K86.89

## 2021-04-30 ENCOUNTER — Telehealth: Payer: Self-pay | Admitting: *Deleted

## 2021-04-30 NOTE — Telephone Encounter (Signed)
Other than some OTC saline drops or Visine drops I can't recommend anything until she's evaluated.

## 2021-04-30 NOTE — Telephone Encounter (Signed)
Pt called in asking for an eye drop for an eye issue she is having. Pt said she was recently in the hospital and has a hospital f/u with PCP on 05/05/21 but wanted to see if PCP can send her in some eye drops before that appt. Pt said her eye issues started when she was in the hospital but she had major issues they were worried about so eye problem was never addressed. Pt said her left eye is "crusty" in the morning and it keeps watering all day. Pt said that it's a little red but not to bad. Pt said it doesn't itch and it's not to painful it's just really "irritating/ bothersome". No earlier appts with PCP so that's why pt is asking if PCP can send her in med now.  [facility] [address]

## 2021-04-30 NOTE — Telephone Encounter (Signed)
Left message to return call to our office.  

## 2021-05-04 NOTE — Telephone Encounter (Signed)
Called patient l/m to call office back. Have not been able to reach. Patent does have appointment with you tomorrow.

## 2021-05-05 ENCOUNTER — Encounter: Payer: Self-pay | Admitting: Primary Care

## 2021-05-05 ENCOUNTER — Ambulatory Visit (INDEPENDENT_AMBULATORY_CARE_PROVIDER_SITE_OTHER): Payer: Commercial Managed Care - HMO | Admitting: Primary Care

## 2021-05-05 ENCOUNTER — Other Ambulatory Visit: Payer: Self-pay

## 2021-05-05 VITALS — BP 118/76 | HR 76 | Temp 97.6°F | Ht 65.0 in | Wt 162.0 lb

## 2021-05-05 DIAGNOSIS — K859 Acute pancreatitis without necrosis or infection, unspecified: Secondary | ICD-10-CM | POA: Diagnosis not present

## 2021-05-05 DIAGNOSIS — E785 Hyperlipidemia, unspecified: Secondary | ICD-10-CM

## 2021-05-05 DIAGNOSIS — Z1159 Encounter for screening for other viral diseases: Secondary | ICD-10-CM | POA: Diagnosis not present

## 2021-05-05 DIAGNOSIS — E1165 Type 2 diabetes mellitus with hyperglycemia: Secondary | ICD-10-CM | POA: Diagnosis not present

## 2021-05-05 DIAGNOSIS — Z794 Long term (current) use of insulin: Secondary | ICD-10-CM

## 2021-05-05 DIAGNOSIS — IMO0002 Reserved for concepts with insufficient information to code with codable children: Secondary | ICD-10-CM | POA: Insufficient documentation

## 2021-05-05 DIAGNOSIS — K8689 Other specified diseases of pancreas: Secondary | ICD-10-CM

## 2021-05-05 LAB — COMPREHENSIVE METABOLIC PANEL
ALT: 26 U/L (ref 0–35)
AST: 18 U/L (ref 0–37)
Albumin: 4.4 g/dL (ref 3.5–5.2)
Alkaline Phosphatase: 117 U/L (ref 39–117)
BUN: 9 mg/dL (ref 6–23)
CO2: 24 mEq/L (ref 19–32)
Calcium: 9.7 mg/dL (ref 8.4–10.5)
Chloride: 99 mEq/L (ref 96–112)
Creatinine, Ser: 0.83 mg/dL (ref 0.40–1.20)
GFR: 84.84 mL/min (ref >60.00)
Glucose, Bld: 361 mg/dL — ABNORMAL HIGH (ref 70–99)
Potassium: 3.7 mEq/L (ref 3.5–5.1)
Sodium: 136 mEq/L (ref 135–145)
Total Bilirubin: 0.6 mg/dL (ref 0.2–1.2)
Total Protein: 7.5 g/dL (ref 6.0–8.3)

## 2021-05-05 LAB — LIPID PANEL
Cholesterol: 223 mg/dL — ABNORMAL HIGH (ref 0–200)
HDL: 33.3 mg/dL — ABNORMAL LOW (ref >39.00)
Total CHOL/HDL Ratio: 7
Triglycerides: 437 mg/dL — ABNORMAL HIGH (ref 0.0–149.0)

## 2021-05-05 LAB — LDL CHOLESTEROL, DIRECT: Direct LDL: 135 mg/dL

## 2021-05-05 LAB — LIPASE: Lipase: 192 U/L — ABNORMAL HIGH (ref 11.0–59.0)

## 2021-05-05 LAB — HEMOGLOBIN A1C: Hgb A1c MFr Bld: 10.7 % — ABNORMAL HIGH (ref 4.6–6.5)

## 2021-05-05 NOTE — Patient Instructions (Signed)
Stop by the lab prior to leaving today. I will notify you of your results once received.   Resume your Glipizide 10 mg twice daily for diabetes.  Resume your Basaglar 20 units in the morning and 42 units in the evening for diabetes.  Continue Metformin XR 500 mg daily.  Please update me regarding your blood sugars in 1 week via MyChart.  It was a pleasure to see you today!

## 2021-05-05 NOTE — Assessment & Plan Note (Signed)
Compliant to Crestor 20 mg, continue same. Continue Fish Oil 2000 mg BID.  Repeat lipids pending.

## 2021-05-05 NOTE — Assessment & Plan Note (Signed)
Recurrent episode with recent hospital admission. She has an appointment with [facility] for repeat ERCP and stent removal.  Repeat labs pending.  Appears improved today.  Hospital notes, labs, imaging reviewed.

## 2021-05-05 NOTE — Progress Notes (Signed)
Subjective:    Patient ID: Christine Cook, female    DOB: Jan 29, 1976, 45 y.o.   MRN: [id number]  HPI  Christine Cook is a very pleasant 45 y.o. female with a history of uncontrolled type 2 diabetes, portal vein thrombosis, acute recurrent pancreatitis, hypertension, OSA, hyperlipidemia who presents today for Cook follow up and follow up of diabetes.  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 She underwent ERCP with endoscopy on 04/24/21, successful removal of one 4 mm pancreatic stone. History of same in April 2022, underwent ERCP with stent placement and removal. Prior to discharge lipase increased from 57 (prior to ERCP) to 217 (post ERCP). She was treated with IV fluids and pain medication. GI recommended repeat ERCP with [facility]. She was discharged home on 04/28/21.  Today she's doing better. She has an appointment with [facility] on 05/20/21 for repeat ERCP. She returned to work last Thursday and Friday, and today. She is will feel tired and still has abdominal pain from her pancreatitis. She works as a Research scientist (physical sciences). She is compliant to her Fish Oil 2000 mg BID. Appetite is slowly improving, is eating smaller meals.   2) Type 2 Diabetes:  Current medications include: Basaglar 20 units in AM and 42 units in PM, Glipizide 10 mg BID, metformin XR 500 mg daily.   She is actually been injecting only 40 units HS of Basaglar as her insulin was reduced and held during her Cook stay. Glipizide was held, she has not resumed.  Glucose readings are consistently in the 400 range throughout the day, 180's fasting in AM.   Last A1C: 10.7 in April 2022 Last Eye Exam: UTD Last Foot Exam: Due today Pneumonia Vaccination: UTD Urine Microalbumin: UTD Statin: Crestor   Dietary changes since last visit: Cutting back carbs, fried food, reducing portion sizes.    Exercise: Walking.     Review of  Systems  Constitutional:  Negative for fever.  Gastrointestinal:  Positive for abdominal pain and nausea. Negative for vomiting.  Neurological:  Negative for numbness.        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   Sleep apnea with hypersomnolence    CPAP study 11-28-09,  10-02-2009 AHI was 16.5, pressure to    Tachycardia     Social History   Socioeconomic History   Marital status: Single    Spouse name: Not on file   Number of children: 1   Years of education: College   Highest education level: Not on file  Occupational History   Occupation: Support specialist    Employer: [employer]  Tobacco Use   Smoking status: Former    Packs/day: 0.25    Years: 2.00    Pack years: 0.50    Types: Cigarettes    Quit date: 2017    Years since quitting: 5.5   Smokeless tobacco: Never  Vaping Use   Vaping Use: Never used  Substance and Sexual Activity   Alcohol use: No   Drug use: No   Sexual activity: Yes    Birth control/protection: I.U.D.  Other Topics Concern   Not on file  Social History Narrative   Single.   One daughter.   Works at [employer].   Enjoys relaxing, spending time with family.   Social Determinants of Health   Financial Resource Strain: Not on file  Food Insecurity: Not on file  Transportation Needs: Not on file  Physical Activity: Not on file  Stress: Not on file  Social Connections: Not on file  Intimate Partner Violence: Not on file    Past Surgical History:  Procedure Laterality Date   ADENOIDECTOMY     BLADDER SURGERY     CHOLECYSTECTOMY      KNEE SURGERY     Left knee x 2    LEEP  10/2016   CIN 1   TONSILLECTOMY      Family History  Problem Relation Age of Onset   Asthma Mother    Hyperthyroidism Mother    Rheum arthritis Mother    Graves' disease Mother    Sjogren's syndrome Mother    Migraines Mother    Asthma Daughter    Migraines Daughter    Colon cancer Neg Hx     Allergies  Allergen Reactions   Codeine Hives and Nausea And Vomiting   Morphine Hives   Neurontin [Gabapentin] Other (See Comments)    syncope   Other Itching    Current Outpatient Medications on File Prior to Visit  Medication Sig Dispense Refill   acetaminophen (TYLENOL) 500 MG tablet Take by mouth.     ARIPiprazole (ABILIFY) 10 MG tablet Take 10 mg by mouth daily.     blood glucose meter kit and supplies KIT Dispense based on patient and insurance preference. Use up to four times daily as directed. (FOR ICD-9 250.00, 250.01). 1 each 0   Insulin Glargine (BASAGLAR KWIKPEN) 100 UNIT/ML Inject 20 units in the morning and 42 units in the evening for diabetes. (Patient taking differently: 40 units in the evening for diabetes.) 30 mL 2   Insulin Pen Needle (PEN NEEDLES) 31G X 6 MM MISC Use nightly with insulin. 100 each 2   Lancets (ONETOUCH DELICA PLUS [id number]) MISC SMARTSIG:Topical 1 to 4 Times Daily     levonorgestrel (MIRENA) 20 MCG/24HR IUD 1 each by Intrauterine route once.     metFORMIN (GLUCOPHAGE-XR) 500 MG 24 hr tablet Take 1 tablet (500 mg total) by mouth daily with breakfast. For diabetes. 90 tablet 1   metoprolol tartrate (LOPRESSOR) 25 MG tablet Take 1 tablet (25 mg total) by mouth 2 (two) times daily. For heart rate. 180 tablet 3   Omega-3 Fatty Acids (FISH OIL) 1000 MG CAPS Take by mouth.     ondansetron (ZOFRAN ODT) 8 MG disintegrating tablet Take 1 tablet (8 mg total) by mouth every 8 (eight) hours as needed for nausea or vomiting. 20 tablet 0   ONETOUCH ULTRA test strip USE TO CHECK BLOOD SUGAR UP TO FOUR TIMES DAILY AS  DIRECTED 100 each 0   pantoprazole (PROTONIX) 40 MG tablet TAKE 1 TABLET BY MOUTH ONCE DAILY FOR HEARTBURN 90 tablet 0   venlafaxine (EFFEXOR) 100 MG tablet Take 300 mg by mouth daily.     apixaban (ELIQUIS) 5 MG TABS tablet Take 2 tablets (33m) by mouth twice daily for 7 days, followed by 1 tablet (511m by mouth twice daily for a total of  3 months (Patient not taking: Reported on 05/05/2021)     Cholecalciferol (VITAMIN D3) 2000 units TABS Take by mouth. (Patient not taking: Reported on 05/05/2021)     Cyanocobalamin (B-12 PO) Take 1,000 mcg by mouth daily. (Patient not taking: Reported on 05/05/2021)     glipiZIDE (GLUCOTROL) 10 MG tablet TAKE 1 TABLET BY MOUTH TWICE DAILY BEFORE A MEAL FOR DIABETES. (Patient not taking: Reported on 05/05/2021) 180 tablet 3   rosuvastatin (CRESTOR) 20 MG tablet Take 1 tablet (20 mg total) by mouth daily. For cholesterol. (Patient not taking: Reported on 05/05/2021) 90 tablet 3   [DISCONTINUED] atorvastatin (LIPITOR) 80 MG tablet Take 1 tablet by mouth every evening for cholesterol. 90 tablet 2   [DISCONTINUED] diphenhydrAMINE (BENADRYL) 25 mg capsule Take 2 capsules (50 mg total) by mouth every 6 (six) hours as needed. 60 capsule 0   [DISCONTINUED] metoCLOPramide (REGLAN) 10 MG tablet Take 1 tablet (10 mg total) by mouth every 6 (six) hours as needed. 30 tablet 0   [DISCONTINUED] potassium chloride SA (K-DUR) 20 MEQ tablet Take 1 tablet by mouth once daily 90 tablet 1   [DISCONTINUED] QUEtiapine (SEROQUEL XR) 50 MG TB24 24 hr tablet Take 1 tablet (50 mg total) by mouth at bedtime.     No current facility-administered medications on file prior to visit.    BP 118/76   Pulse 76   Temp 97.6 F (36.4 C) (Temporal)   Ht '5\' 5"'  (1.651 m)   Wt 162 lb (73.5 kg)   SpO2 98%   BMI 26.96 kg/m  Objective:   Physical Exam Cardiovascular:     Rate and Rhythm: Normal rate and regular rhythm.  Pulmonary:     Effort: Pulmonary effort is normal.     Breath sounds: Normal breath  sounds.  Abdominal:     General: Abdomen is flat.     Palpations: Abdomen is soft.     Tenderness: There is no abdominal tenderness.  Musculoskeletal:     Cervical back: Neck supple.  Skin:    General: Skin is warm and dry.  Psychiatric:        Mood and Affect: Mood normal.          Assessment & Plan:      This visit occurred during the SARS-CoV-2 public health emergency.  Safety protocols were in place, including screening questions prior to the visit, additional usage of staff PPE, and extensive cleaning of exam room while observing appropriate contact time as indicated for disinfecting solutions.

## 2021-05-05 NOTE — Assessment & Plan Note (Signed)
Uncontrolled.  Was gaining control until hospital admission.  Resume Glipizide 10 mg BID. Resume basaglar 20 units in AM and 42 units in PM. Continue Metformin XR 500 mg.  Consider addition of Trulicity [id number] mg week but will wait for update from patient via My Chart in 1 week after she has resumed insulin and glipizide.

## 2021-05-06 LAB — HEPATITIS C ANTIBODY
Hepatitis C Ab: NONREACTIVE
SIGNAL TO CUT-OFF: 0.02 (ref <1.00)

## 2021-05-28 MED ORDER — NOVOLOG FLEXPEN 100 UNIT/ML ~~LOC~~ SOPN: 10.0000 [IU] | PEN_INJECTOR | Freq: Three times a day (TID) | SUBCUTANEOUS | 2 refills | Status: DC

## 2021-06-09 ENCOUNTER — Telehealth: Payer: Self-pay

## 2021-06-11 NOTE — Telephone Encounter (Signed)
My chart sent to patient to let know. No further action needed.

## 2021-06-21 ENCOUNTER — Ambulatory Visit: Payer: Commercial Managed Care - HMO

## 2021-06-23 ENCOUNTER — Other Ambulatory Visit: Payer: Self-pay

## 2021-06-23 DIAGNOSIS — K219 Gastro-esophageal reflux disease without esophagitis: Secondary | ICD-10-CM

## 2021-06-23 DIAGNOSIS — Z794 Long term (current) use of insulin: Secondary | ICD-10-CM

## 2021-06-23 DIAGNOSIS — I10 Essential (primary) hypertension: Secondary | ICD-10-CM

## 2021-06-23 DIAGNOSIS — E785 Hyperlipidemia, unspecified: Secondary | ICD-10-CM

## 2021-06-23 DIAGNOSIS — R000 Tachycardia, unspecified: Secondary | ICD-10-CM

## 2021-06-23 DIAGNOSIS — E119 Type 2 diabetes mellitus without complications: Secondary | ICD-10-CM

## 2021-06-23 DIAGNOSIS — E1165 Type 2 diabetes mellitus with hyperglycemia: Secondary | ICD-10-CM

## 2021-06-23 NOTE — Telephone Encounter (Signed)
Fax received for all medications pending.

## 2021-06-24 NOTE — Telephone Encounter (Signed)
Please call patient and complete a full medication reconciliation.  Then send this back to me.  She also needs an appt for early October, schedule please.  How are her blood sugars running? Is she injecting the Basaglar and Novolog insulin?

## 2021-06-25 ENCOUNTER — Ambulatory Visit
Admission: EM | Admit: 2021-06-25 | Discharge: 2021-06-25 | Disposition: A | Discharge status: Home / Self Care | Payer: Commercial Managed Care - HMO | Attending: Emergency Medicine | Admitting: Emergency Medicine

## 2021-06-25 ENCOUNTER — Telehealth: Payer: Self-pay | Admitting: Primary Care

## 2021-06-25 ENCOUNTER — Other Ambulatory Visit: Payer: Self-pay

## 2021-06-25 ENCOUNTER — Encounter: Payer: Self-pay | Admitting: Emergency Medicine

## 2021-06-25 DIAGNOSIS — J019 Acute sinusitis, unspecified: Secondary | ICD-10-CM

## 2021-06-25 DIAGNOSIS — H109 Unspecified conjunctivitis: Secondary | ICD-10-CM | POA: Diagnosis not present

## 2021-06-25 DIAGNOSIS — B9689 Other specified bacterial agents as the cause of diseases classified elsewhere: Secondary | ICD-10-CM

## 2021-06-25 MED ORDER — IBUPROFEN 800 MG PO TABS: 800.0000 mg | ORAL_TABLET | Freq: Three times a day (TID) | ORAL | 0 refills | Status: DC

## 2021-06-25 MED ORDER — AMOXICILLIN-POT CLAVULANATE 875-125 MG PO TABS: 1.0000 | ORAL_TABLET | Freq: Two times a day (BID) | ORAL | 0 refills | Status: DC

## 2021-06-25 MED ORDER — PROMETHAZINE-DM 6.25-15 MG/5ML PO SYRP: 5.0000 mL | ORAL_SOLUTION | Freq: Four times a day (QID) | ORAL | 0 refills | Status: DC | PRN

## 2021-06-25 MED ORDER — POLYMYXIN B-TRIMETHOPRIM 10000-0.1 UNIT/ML-% OP SOLN: 1.0000 [drp] | OPHTHALMIC | 0 refills | Status: DC

## 2021-06-25 NOTE — Telephone Encounter (Signed)
Left message to return call to our office.  

## 2021-06-25 NOTE — ED Provider Notes (Signed)
[facility]    CSN: [id number] Arrival date & time: 06/25/21  0810      History   Chief Complaint Chief Complaint  Patient presents with  . Nasal Congestion  . Sore Throat  . Facial Pain  . Eye Drainage    HPI Christine Cook is a 45 y.o. female.   Patient presents with nasal congestion, rhinorrhea, bilateral eye redness and swelling with drainage, sore throat, hacking productive cough, intermittent generalized headache, facial pain beginning 10 days ago.  No known sick contact however family member in household has similar symptoms.  Vaccinated.  Has attempted use of over-the-counter medications with no relief.  Home COVID test negative.  Denies fever, chills, body aches, abdominal pain, nausea, vomiting, diarrhea, chest pain or tightness, shortness of breath, wheezing.  History of anxiety, diabetes, heart murmur, hypertension, IBS, migraines, sleep apnea.  Past Medical History:  Diagnosis Date  . Anxiety   . Benzodiazepine overdose   . Depression   . Diabetes ([place])   . Fatty liver disease, nonalcoholic   . Heart murmur   . Heart palpitations   . High cholesterol   . 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 Hypertension   . Interstitial cystitis   . Irritable bowel syndrome with diarrhea   . 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 Migraine   . 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 Sleep apnea with hypersomnolence    CPAP study 11-28-09,  10-02-2009 AHI was 16.5, pressure to   . Tachycardia     Patient Active Problem List   Diagnosis Date Noted  . Uncontrolled type 2 diabetes mellitus ([place]) 05/05/2021  . 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 Type 2 diabetes mellitus with hyperglycemia, with long-term current use of insulin ([place]) 01/22/2021  . 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 Screening for cervical cancer 09/11/2019  . Arthralgia of right temporomandibular joint 02/15/2019  . Plantar fasciitis  01/02/2018  . Back pain 01/02/2018  . Hypokalemia 11/29/2017  . Cervical dysplasia 12/01/2016  . Hyperlipidemia 08/05/2016  . Tachycardia 08/05/2016  . OSA (obstructive sleep apnea) 07/12/2016  . 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 Vitamin B12 deficiency 07/12/2016  . Vitamin D deficiency 07/12/2016  . Depression 07/11/2016  . Impulse control disorder (gambling) 07/11/2016  . HTN (hypertension) 07/09/2016  . GERD (gastroesophageal reflux disease) 01/21/2009    Past Surgical History:  Procedure Laterality Date  . ADENOIDECTOMY    . BLADDER SURGERY    . CHOLECYSTECTOMY    . KNEE SURGERY     Left knee x 2   . LEEP  10/2016   CIN 1  . TONSILLECTOMY      OB History   No obstetric history on file.      Home Medications    Prior to Admission medications   Medication Sig Start Date End Date Taking? Authorizing Provider  acetaminophen (TYLENOL) 500 MG tablet Take by mouth. 01/16/21  Yes [provider]  amoxicillin-clavulanate (AUGMENTIN) 875-125 MG tablet Take 1 tablet by mouth every 12 (twelve) hours. 06/25/21  Yes Nahom Carfagno R, NP  ibuprofen (ADVIL) 800 MG tablet Take 1 tablet (800 mg total) by mouth 3 (three) times daily. 06/25/21  Yes Qunicy Higinbotham, Leitha Schuller, NP  promethazine-dextromethorphan (PROMETHAZINE-DM) 6.25-15 MG/5ML syrup Take 5 mLs by mouth 4 (four) times daily as needed for cough. 06/25/21  Yes Caral Whan, Leitha Schuller, NP  trimethoprim-polymyxin b (POLYTRIM) ophthalmic solution Place 1 drop into both eyes every  4 (four) hours. 06/25/21  Yes Sharnee Douglass, Leitha Schuller, NP  apixaban (ELIQUIS) 5 MG TABS tablet Take 2 tablets (87m) by mouth twice daily for 7 days, followed by 1 tablet (559m by mouth twice daily for a total of 3 months Patient not taking: No sig reported 01/16/21   [provider]  ARIPiprazole (ABILIFY) 10 MG tablet Take 10 mg by mouth daily.    [provider]  blood glucose meter kit and supplies KIT Dispense based on patient and insurance preference.  Use up to four times daily as directed. (FOR ICD-9 250.00, 250.01). 10/28/20   ClPleas KochNP  Cholecalciferol (VITAMIN D3) 2000 units TABS Take by mouth. Patient not taking: Reported on 05/05/2021    [provider]  Cyanocobalamin (B-12 PO) Take 1,000 mcg by mouth daily. Patient not taking: Reported on 05/05/2021    [provider]  glipiZIDE (GLUCOTROL) 10 MG tablet TAKE 1 TABLET BY MOUTH TWICE DAILY BEFORE A MEAL FOR DIABETES. Patient not taking: Reported on 05/05/2021 10/28/20   ClPleas KochNP  insulin aspart (NOVOLOG FLEXPEN) 100 UNIT/ML FlexPen Inject 10 Units into the skin 3 (three) times daily with meals. 05/28/21   ClPleas KochNP  Insulin Glargine (BChildren'S Hospital At Mission100 UNIT/ML Inject 20 units in the morning and 42 units in the evening for diabetes. Patient taking differently: 40 units in the evening for diabetes. 04/01/21   ClPleas KochNP  Insulin Pen Needle (PEN NEEDLES) 31G X 6 MM MISC Use nightly with insulin. 10/28/20   ClPleas KochNP  Lancets (OBaptist Emergency Hospital - Westover HillsELICA PLUS [id number] FurnaceMARTSIG:Topical 1 to 4 Times Daily 10/29/20   [provider]  levonorgestrel (MIRENA) 20 MCG/24HR IUD 1 each by Intrauterine route once. 01/16/14   LaHassell DoneFNP  metFORMIN (GLUCOPHAGE-XR) 500 MG 24 hr tablet Take 1 tablet (500 mg total) by mouth daily with breakfast. For diabetes. 10/28/20   ClPleas KochNP  metoprolol tartrate (LOPRESSOR) 25 MG tablet Take 1 tablet (25 mg total) by mouth 2 (two) times daily. For heart rate. 10/28/20   ClPleas KochNP  Omega-3 Fatty Acids (FISH OIL) 1000 MG CAPS Take by mouth. 04/28/21   [provider]  ondansetron (ZOFRAN ODT) 8 MG disintegrating tablet Take 1 tablet (8 mg total) by mouth every 8 (eight) hours as needed for nausea or vomiting. 12/23/20   RyMargarette CanadaNP  ONLone Peak HospitalLTRA test strip USE TO CHECK BLOOD SUGAR UP TO FOUR TIMES DAILY AS DIRECTED 02/05/21   ClPleas KochNP   pantoprazole (PROTONIX) 40 MG tablet TAKE 1 TABLET BY MOUTH ONCE DAILY FOR HEARTBURN 02/24/21   ClPleas KochNP  rosuvastatin (CRESTOR) 20 MG tablet Take 1 tablet (20 mg total) by mouth daily. For cholesterol. Patient not taking: Reported on 05/05/2021 10/29/20   ClPleas KochNP  venlafaxine (EEndoscopy Of Plano LP100 MG tablet Take 300 mg by mouth daily.    [provider]  atorvastatin (LIPITOR) 80 MG tablet Take 1 tablet by mouth every evening for cholesterol. 03/26/19 08/06/20  ClPleas KochNP  diphenhydrAMINE (BENADRYL) 25 mg capsule Take 2 capsules (50 mg total) by mouth every 6 (six) hours as needed. 04/07/20 08/06/20  StCarrie MewMD  metoCLOPramide (REGLAN) 10 MG tablet Take 1 tablet (10 mg total) by mouth every 6 (six) hours as needed. 04/07/20 08/06/20  StCarrie MewMD  potassium chloride SA (K-DUR) 20 MEQ tablet Take 1 tablet by mouth once daily 04/12/19 08/06/20  Pleas Koch, NP  QUEtiapine (SEROQUEL XR) 50 MG TB24 24 hr tablet Take 1 tablet (50 mg total) by mouth at bedtime. 07/09/19 08/06/20  Tonia Ghent, MD    Family History Family History  Problem Relation Age of Onset  . Asthma Mother   . Hyperthyroidism Mother   . Rheum arthritis Mother   . Graves' disease Mother   . Sjogren's syndrome Mother   . Migraines Mother   . Asthma Daughter   . Migraines Daughter   . Colon cancer Neg Hx     Social History Social History   Tobacco Use  . Smoking status: Former    Packs/day: 0.25    Years: 2.00    Pack years: 0.50    Types: Cigarettes    Quit date: 2017    Years since quitting: 5.7  . Smokeless tobacco: Never  Vaping Use  . Vaping Use: Never used  Substance Use Topics  . Alcohol use: No  . Drug use: No     Allergies   Codeine, Morphine, Neurontin [gabapentin], and Other   Review of Systems Review of Systems Defer to HPI    Physical Exam Triage Vital Signs ED Triage Vitals  Enc Vitals Group     BP 06/25/21 0827 (!) 147/93      Pulse Rate 06/25/21 0827 91     Resp --      Temp 06/25/21 0827 98.3 F (36.8 C)     Temp Source 06/25/21 0827 Oral     SpO2 06/25/21 0827 97 %     Weight --      Height --      Head Circumference --      Peak Flow --      Pain Score 06/25/21 0824 5     Pain Loc --      Pain Edu? --      Excl. in [place]? --    No data found.  Updated Vital Signs BP (!) 147/93 (BP Location: Left Arm)   Pulse 91   Temp 98.3 F (36.8 C) (Oral)   SpO2 97%   Visual Acuity Right Eye Distance:   Left Eye Distance:   Bilateral Distance:    Right Eye Near:   Left Eye Near:    Bilateral Near:     Physical Exam Constitutional:      Appearance: Normal appearance. She is normal weight.  HENT:     Head: Normocephalic.     Right Ear: Tympanic membrane, ear canal and external ear normal.     Left Ear: Tympanic membrane, ear canal and external ear normal.     Nose: Congestion and rhinorrhea present.     Right Sinus: Maxillary sinus tenderness present. No frontal sinus tenderness.     Left Sinus: Maxillary sinus tenderness present. No frontal sinus tenderness.     Mouth/Throat:     Mouth: Mucous membranes are moist.     Pharynx: Posterior oropharyngeal erythema present.  Eyes:     Extraocular Movements: Extraocular movements intact.     Comments: Redness present in the left conjunctiva, bilateral periorbital swelling, mild, no discharge present but crusting within the left corner of the left eye.  Cardiovascular:     Rate and Rhythm: Normal rate and regular rhythm.     Pulses: Normal pulses.     Heart sounds: Normal heart sounds.  Pulmonary:     Effort: Pulmonary effort is normal.     Breath sounds: Normal breath sounds.  Abdominal:  General: Abdomen is flat. Bowel sounds are normal.     Palpations: Abdomen is soft.  Musculoskeletal:     Cervical back: Normal range of motion.  Lymphadenopathy:     Cervical: Cervical adenopathy present.  Skin:    General: Skin is warm and dry.   Neurological:     Mental Status: She is alert and oriented to person, place, and time. Mental status is at baseline.  Psychiatric:        Mood and Affect: Mood normal.        Behavior: Behavior normal.     UC Treatments / Results  Labs (all labs ordered are listed, but only abnormal results are displayed) Labs Reviewed - No data to display  EKG   Radiology No results found.  Procedures Procedures (including critical care time)  Medications Ordered in UC Medications - No data to display  Initial Impression / Assessment and Plan / UC Course  I have reviewed the triage vital signs and the nursing notes.  Pertinent labs & imaging results that were available during my care of the patient were reviewed by me and considered in my medical decision making (see chart for details).  Acute bacterial sinusitis.  Conjunctivitis of both eyes  Based on the timeline and symptoms with associated sinus pressure we will move forward with antibiotic treatment, discussed with patient, will defer viral testing at this time as patient is out of quarantine guidelines as well as past the timeline for use of antiviral treatment  1.  Augmentin 875-125 mg twice daily for 7 days 2.  Polytrim 1 drop each eye every 4 hours for 7 days 3.  Promethazine DM 6.25-15 mg / 5 mL every 6 hours prn 4.  Ibuprofen 800 mg every 8 hours as needed 5.  Follow-up at urgent care with primary care doctor as needed Final Clinical Impressions(s) / UC Diagnoses   Final diagnoses:  Acute bacterial sinusitis  Bacterial conjunctivitis of both eyes     Discharge Instructions      Based on the timeline of your symptoms we can attempt use of an antibiotic this time to see if it will give you relief  Based on the timeline of your symptoms we will not test for COVID or flu today, you are out of the required quarantine guidelines and that you are out of the timeline to get antiviral treatment therefore it would not change  anything that we do as ar as treatment today   You may return to urgent care after completion of medication if symptoms persist or worsen  Take Augmentin twice a day for the next 7 days  For your cough you may attempt use of Promethazine DM every 6 hours as needed   for your headaches you may attempt use of ibuprofen every 8 hours for comfort  For your eyes place 1 drop of solution into each eye every 4 hours while awake for the next 7 days, please  make sure that the tip of the dropper does not touch her eyes    For cough: honey 1/2 to 1 teaspoon (you can dilute the honey in water or another fluid).  You can also use guaifenesin and dextromethorphan for cough. You can use a humidifier for chest congestion and cough.  If you don't have a humidifier, you can sit in the bathroom with the hot shower running.      For sore throat: try warm salt water gargles, cepacol lozenges, throat spray, warm tea or water  with lemon/honey, popsicles or ice, or OTC cold relief medicine for throat discomfort.   For congestion: take a daily anti-histamine like Zyrtec, Claritin, and a oral decongestant, such as pseudoephedrine.  You can also use Flonase 1-2 sprays in each nostril daily.   It is important to stay hydrated: drink plenty of fluids (water, gatorade/powerade/pedialyte, juices, or teas) to keep your throat moisturized and help further relieve irritation/discomfort.     ED Prescriptions     Medication Sig Dispense Auth. Provider   amoxicillin-clavulanate (AUGMENTIN) 875-125 MG tablet Take 1 tablet by mouth every 12 (twelve) hours. 14 tablet Daleon Willinger R, NP   promethazine-dextromethorphan (PROMETHAZINE-DM) 6.25-15 MG/5ML syrup Take 5 mLs by mouth 4 (four) times daily as needed for cough. 118 mL Kirstie Larsen R, NP   ibuprofen (ADVIL) 800 MG tablet Take 1 tablet (800 mg total) by mouth 3 (three) times daily. 21 tablet Danie Hannig R, NP   trimethoprim-polymyxin b (POLYTRIM) ophthalmic  solution Place 1 drop into both eyes every 4 (four) hours. 10 mL Hans Eden, NP      PDMP not reviewed this encounter.   Hans Eden, [place] 06/25/21 [phone]

## 2021-06-25 NOTE — Discharge Instructions (Signed)
Based on the timeline of your symptoms we can attempt use of an antibiotic this time to see if it will give you relief  Based on the timeline of your symptoms we will not test for COVID or flu today, you are out of the required quarantine guidelines and that you are out of the timeline to get antiviral treatment therefore it would not change anything that we do as ar as treatment today   You may return to urgent care after completion of medication if symptoms persist or worsen  Take Augmentin twice a day for the next 7 days  For your cough you may attempt use of Promethazine DM every 6 hours as needed   for your headaches you may attempt use of ibuprofen every 8 hours for comfort  For your eyes place 1 drop of solution into each eye every 4 hours while awake for the next 7 days, please  make sure that the tip of the dropper does not touch her eyes    For cough: honey 1/2 to 1 teaspoon (you can dilute the honey in water or another fluid).  You can also use guaifenesin and dextromethorphan for cough. You can use a humidifier for chest congestion and cough.  If you don't have a humidifier, you can sit in the bathroom with the hot shower running.      For sore throat: try warm salt water gargles, cepacol lozenges, throat spray, warm tea or water with lemon/honey, popsicles or ice, or OTC cold relief medicine for throat discomfort.   For congestion: take a daily anti-histamine like Zyrtec, Claritin, and a oral decongestant, such as pseudoephedrine.  You can also use Flonase 1-2 sprays in each nostril daily.   It is important to stay hydrated: drink plenty of fluids (water, gatorade/powerade/pedialyte, juices, or teas) to keep your throat moisturized and help further relieve irritation/discomfort.

## 2021-06-25 NOTE — Telephone Encounter (Signed)
Pt returning your call

## 2021-06-25 NOTE — ED Triage Notes (Addendum)
Pt presents today with c/o of nasal congestion, left eye redness/swelling, sore throat and productive cough (green) that began 1.5 wks ago.

## 2021-06-26 NOTE — Telephone Encounter (Signed)
Duplicate message see open message for documentation.

## 2021-07-01 MED ORDER — PANTOPRAZOLE SODIUM 40 MG PO TBEC: DELAYED_RELEASE_TABLET | ORAL | 0 refills | Status: DC

## 2021-07-01 MED ORDER — BD PEN NEEDLE MINI U/F 31G X 5 MM MISC: 0 refills | Status: DC

## 2021-07-01 MED ORDER — METFORMIN HCL ER 500 MG PO TB24: 500.0000 mg | ORAL_TABLET | Freq: Every day | ORAL | 0 refills | Status: DC

## 2021-07-01 MED ORDER — ONETOUCH ULTRA VI STRP: ORAL_STRIP | 0 refills | Status: DC

## 2021-07-01 MED ORDER — FISH OIL 1000 MG PO CAPS: 2000.0000 mg | ORAL_CAPSULE | Freq: Two times a day (BID) | ORAL | 0 refills | Status: DC

## 2021-07-01 NOTE — Telephone Encounter (Signed)
Called patient medications reviewed. Taking updated list in chart. She has not started Novolog will start over the weekend. No change in blood sugar readings and I have made office visit for 07/07/2021.

## 2021-07-01 NOTE — Telephone Encounter (Signed)
Prescription sent to pharmacy that need of refills.  Some prescriptions did not need refills.

## 2021-07-07 ENCOUNTER — Ambulatory Visit: Payer: Commercial Managed Care - HMO | Admitting: Primary Care

## 2021-07-11 ENCOUNTER — Ambulatory Visit: Payer: Self-pay

## 2021-08-11 ENCOUNTER — Ambulatory Visit: Payer: Commercial Managed Care - HMO | Admitting: Primary Care

## 2021-08-11 DIAGNOSIS — F411 Generalized anxiety disorder: Secondary | ICD-10-CM

## 2021-08-11 DIAGNOSIS — F330 Major depressive disorder, recurrent, mild: Secondary | ICD-10-CM

## 2021-08-11 DIAGNOSIS — E119 Type 2 diabetes mellitus without complications: Secondary | ICD-10-CM

## 2021-08-11 MED ORDER — BASAGLAR KWIKPEN 100 UNIT/ML ~~LOC~~ SOPN: PEN_INJECTOR | SUBCUTANEOUS | 0 refills | Status: DC

## 2021-08-17 ENCOUNTER — Telehealth: Payer: Self-pay | Admitting: Primary Care

## 2021-08-17 NOTE — Telephone Encounter (Signed)
Do you have another office you would like to send patient referral to?

## 2021-08-17 NOTE — Telephone Encounter (Signed)
Nevin Bloodgood with [facility] called stating that they don't take pt secondary insurance, so they want be able to take pt.

## 2021-08-18 NOTE — Telephone Encounter (Signed)
Reaching out to the patient regarding information for a Psychiatry office that accepts [facility] insurance   [facility] [place] [address], [place], [place] [address] Phone: [phone] Fax: [phone]  Key Services: Psychiatric evaluation Medication management Therapy/counseling

## 2021-08-18 NOTE — Telephone Encounter (Signed)
Anywhere is fine, wherever we can get her in the soonest

## 2021-08-18 NOTE — Addendum Note (Signed)
Addended by: Pleas Koch on: 08/18/2021 06:42 AM   Modules accepted: Orders

## 2021-08-18 NOTE — Telephone Encounter (Signed)
Do you need new referral or can you send to new office from this one?

## 2021-08-19 NOTE — Telephone Encounter (Signed)
Thank you :)

## 2021-10-07 ENCOUNTER — Ambulatory Visit: Payer: Commercial Managed Care - HMO | Admitting: Primary Care

## 2021-10-07 ENCOUNTER — Other Ambulatory Visit: Payer: Self-pay | Admitting: Primary Care

## 2021-10-07 DIAGNOSIS — E119 Type 2 diabetes mellitus without complications: Secondary | ICD-10-CM

## 2021-10-08 NOTE — Telephone Encounter (Signed)
Patient is significantly overdue for diabetes follow up and is scheduled for March.  Needs virtual visit and [place] [facility] labs asap.

## 2021-10-14 NOTE — Telephone Encounter (Signed)
Left message to return call to our office.  

## 2021-10-19 NOTE — Telephone Encounter (Signed)
Please call to set up appointment as requested by Anda Kraft

## 2021-10-26 ENCOUNTER — Other Ambulatory Visit: Payer: Self-pay | Admitting: Primary Care

## 2021-10-26 DIAGNOSIS — E1165 Type 2 diabetes mellitus with hyperglycemia: Secondary | ICD-10-CM

## 2021-11-02 ENCOUNTER — Ambulatory Visit (INDEPENDENT_AMBULATORY_CARE_PROVIDER_SITE_OTHER): Payer: PRIVATE HEALTH INSURANCE | Admitting: Primary Care

## 2021-11-02 ENCOUNTER — Encounter: Payer: Self-pay | Admitting: Primary Care

## 2021-11-02 ENCOUNTER — Other Ambulatory Visit: Payer: Self-pay

## 2021-11-02 VITALS — BP 136/78 | HR 86 | Temp 97.6°F | Ht 65.0 in | Wt 161.0 lb

## 2021-11-02 DIAGNOSIS — E1165 Type 2 diabetes mellitus with hyperglycemia: Secondary | ICD-10-CM

## 2021-11-02 DIAGNOSIS — Z23 Encounter for immunization: Secondary | ICD-10-CM | POA: Diagnosis not present

## 2021-11-02 DIAGNOSIS — E119 Type 2 diabetes mellitus without complications: Secondary | ICD-10-CM

## 2021-11-02 DIAGNOSIS — E785 Hyperlipidemia, unspecified: Secondary | ICD-10-CM

## 2021-11-02 DIAGNOSIS — Z794 Long term (current) use of insulin: Secondary | ICD-10-CM | POA: Diagnosis not present

## 2021-11-02 DIAGNOSIS — R110 Nausea: Secondary | ICD-10-CM

## 2021-11-02 LAB — POCT GLYCOSYLATED HEMOGLOBIN (HGB A1C): Hemoglobin A1C: 10.3 % — AB (ref 4.0–5.6)

## 2021-11-02 MED ORDER — BASAGLAR KWIKPEN 100 UNIT/ML ~~LOC~~ SOPN: PEN_INJECTOR | SUBCUTANEOUS | 1 refills | Status: DC

## 2021-11-02 MED ORDER — ONDANSETRON 8 MG PO TBDP: 8.0000 mg | ORAL_TABLET | Freq: Three times a day (TID) | ORAL | 0 refills | Status: DC | PRN

## 2021-11-02 MED ORDER — NOVOLOG FLEXPEN 100 UNIT/ML ~~LOC~~ SOPN: 12.0000 [IU] | PEN_INJECTOR | Freq: Three times a day (TID) | SUBCUTANEOUS | 1 refills | Status: DC

## 2021-11-02 MED ORDER — PEN NEEDLES 31G X 6 MM MISC: 3 refills | Status: DC

## 2021-11-02 MED ORDER — METFORMIN HCL ER 500 MG PO TB24: 1000.0000 mg | ORAL_TABLET | Freq: Every day | ORAL | 1 refills | Status: DC

## 2021-11-02 NOTE — Patient Instructions (Signed)
Here are the changes we made today:  Increased metformin XR 500 mg to 2 tablets once daily.  Increased your Basaglar (long acting insulin) to 25 units in the morning for 3-5 days, then to 30 units in the morning thereafter. Continue injecting 42 units in the evening.  Increased your Novlog to 12-15 units three times daily with meals.  Continue Glipizide 10 mg twice daily as is.  Stop by the lab prior to leaving today. I will notify you of your results once received.   It was a pleasure to see you today!

## 2021-11-02 NOTE — Progress Notes (Signed)
Subjective:    Patient ID: Christine Cook, female    DOB: Apr 24, 1976, 46 y.o.   MRN: [id number]  HPI  Christine Cook is a very pleasant 46 y.o. female with a history of hypertension, OSA, GERD, pancreatitis, uncontrolled type 2 diabetes, depression presents today to follow up for diabetes.  Current medications include: Glipizide 10 mg BID, metformin XR 500 mg daily, Basaglar 20 units in AM and 42 units in PM, Novolog 10 units TID.   She continues to experience intermittent nausea since her pancreatitis episodes last year. She is requesting a refill of her Zofran.   She is checking her blood glucose 2 times daily and is getting readings of:  AM fasting: 160's Before lunch: high 300's and high 400's 2 hours after dinner: high 300's  Last A1C: 10.7 in August 2022, 10.3 today Last Eye Exam: UTD Last Foot Exam: UTD Pneumonia Vaccination: 2019 Urine Microalbumin: Due Statin: Crestor   Dietary changes since last visit: She is drinking Sprite, has tried to cut back and drink more water. Mostly home cooked meals, vegetables, lean protein, soups.    Exercise: Active at work. Is going to the gym 2 days weekly.       Review of Systems  Constitutional:  Positive for fatigue.  Eyes:  Positive for visual disturbance.  Respiratory:  Negative for shortness of breath.   Cardiovascular:  Negative for chest pain.  Endocrine: Positive for polydipsia, polyphagia and polyuria.  Neurological:  Negative for dizziness.        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   Sleep apnea with  hypersomnolence    CPAP study 11-28-09,  10-02-2009 AHI was 16.5, pressure to    Tachycardia     Social History   Socioeconomic History   Marital status: Single    Spouse name: Not on file   Number of children: 1   Years of education: College   Highest education level: Not on file  Occupational History   Occupation: Support specialist    Employer: [employer]  Tobacco Use   Smoking status: Former    Packs/day: 0.25    Years: 2.00    Pack years: 0.50    Types: Cigarettes    Quit date: 2017    Years since quitting: 6.0   Smokeless tobacco: Never  Vaping Use   Vaping Use: Never used  Substance and Sexual Activity   Alcohol use: No   Drug use: No   Sexual activity: Yes    Birth control/protection: I.U.D.  Other Topics Concern   Not on file  Social History Narrative   Single.   One daughter.   Works at [employer].   Enjoys relaxing, spending time with family.   Social Determinants of Health   Financial Resource Strain: Not on file  Food Insecurity: Not on file  Transportation Needs: Not on file  Physical Activity: Not on file  Stress: Not on file  Social Connections: Not on file  Intimate Partner Violence:  Not on file    Past Surgical History:  Procedure Laterality Date   ADENOIDECTOMY     BLADDER SURGERY     CHOLECYSTECTOMY     KNEE SURGERY     Left knee x 2    LEEP  10/2016   CIN 1   TONSILLECTOMY      Family History  Problem Relation Age of Onset   Asthma Mother    Hyperthyroidism Mother    Rheum arthritis Mother    Graves' disease Mother    Sjogren's syndrome Mother    Migraines Mother    Asthma Daughter    Migraines Daughter    Colon cancer Neg Hx     Allergies  Allergen Reactions   Codeine Hives and Nausea And Vomiting   Morphine Hives   Neurontin [Gabapentin] Other (See Comments)    syncope   Other Itching    Current Outpatient Medications on File Prior to Visit  Medication Sig Dispense Refill   acetaminophen (TYLENOL) 500 MG  tablet Take by mouth.     ARIPiprazole (ABILIFY) 10 MG tablet Take 10 mg by mouth daily.     blood glucose meter kit and supplies KIT Dispense based on patient and insurance preference. Use up to four times daily as directed. (FOR ICD-9 250.00, 250.01). 1 each 0   glipiZIDE (GLUCOTROL) 10 MG tablet TAKE 1 TABLET BY MOUTH TWICE DAILY BEFORE A MEAL FOR DIABETES. (Patient taking differently: TAKE 1 TABLET BY MOUTH TWICE DAILY BEFORE A MEAL FOR DIABETES. **only taking once daily) 180 tablet 3   glucose blood (ONETOUCH ULTRA) test strip USE TO CHECK BLOOD SUGAR UP TO FOUR TIMES DAILY AS DIRECTED 400 each 0   insulin aspart (NOVOLOG FLEXPEN) 100 UNIT/ML FlexPen Inject 10 Units into the skin 3 (three) times daily with meals. 15 mL 2   Insulin Glargine (BASAGLAR KWIKPEN) 100 UNIT/ML INJECT 20 UNITS SUBCUTANEOUSLY IN THE MORNING AND 42 IN THE EVENING FOR DIABETES. Office visit required for further refills. 30 mL 0   Insulin Pen Needle (B-D UF III MINI PEN NEEDLES) 31G X 5 MM MISC Use with Novolog and Basaglar insulin as prescribed. 360 each 0   Insulin Pen Needle (PEN NEEDLES) 31G X 6 MM MISC Use nightly with insulin. 100 each 2   Lancets (ONETOUCH DELICA PLUS [id number]) MISC SMARTSIG:Topical 1 to 4 Times Daily     levonorgestrel (MIRENA) 20 MCG/24HR IUD 1 each by Intrauterine route once.     metFORMIN (GLUCOPHAGE-XR) 500 MG 24 hr tablet Take 1 tablet (500 mg total) by mouth daily with breakfast. For diabetes. Office visit required for further refills. 90 tablet 0   metoprolol tartrate (LOPRESSOR) 25 MG tablet Take 1 tablet (25 mg total) by mouth 2 (two) times daily. For heart rate. 180 tablet 3   Omega-3 Fatty Acids (FISH OIL) 1000 MG CAPS Take 2 capsules (2,000 mg total) by mouth 2 (two) times daily. 360 capsule 0   ondansetron (ZOFRAN ODT) 8 MG disintegrating tablet Take 1 tablet (8 mg total) by mouth every 8 (eight) hours as needed for nausea or vomiting. 20 tablet 0   pantoprazole (PROTONIX) 40 MG tablet  TAKE 1 TABLET BY MOUTH ONCE DAILY FOR HEARTBURN 90 tablet 0   venlafaxine (EFFEXOR) 100 MG tablet Take 300 mg by mouth daily.     Cholecalciferol (VITAMIN D3) 2000 units TABS Take by mouth. (Patient not taking: Reported on 11/02/2021)     Cyanocobalamin (B-12 PO) Take 1,000 mcg by mouth daily. (Patient  not taking: Reported on 05/05/2021)     rosuvastatin (CRESTOR) 20 MG tablet Take 1 tablet (20 mg total) by mouth daily. For cholesterol. (Patient not taking: Reported on 11/02/2021) 90 tablet 3   [DISCONTINUED] atorvastatin (LIPITOR) 80 MG tablet Take 1 tablet by mouth every evening for cholesterol. 90 tablet 2   [DISCONTINUED] diphenhydrAMINE (BENADRYL) 25 mg capsule Take 2 capsules (50 mg total) by mouth every 6 (six) hours as needed. 60 capsule 0   [DISCONTINUED] metoCLOPramide (REGLAN) 10 MG tablet Take 1 tablet (10 mg total) by mouth every 6 (six) hours as needed. 30 tablet 0   [DISCONTINUED] potassium chloride SA (K-DUR) 20 MEQ tablet Take 1 tablet by mouth once daily 90 tablet 1   [DISCONTINUED] QUEtiapine (SEROQUEL XR) 50 MG TB24 24 hr tablet Take 1 tablet (50 mg total) by mouth at bedtime.     No current facility-administered medications on file prior to visit.    BP 136/78    Pulse 86    Temp 97.6 F (36.4 C) (Temporal)    Ht '5\' 5"'  (1.651 m)    Wt 161 lb (73 kg)    SpO2 96%    BMI 26.79 kg/m  Objective:   Physical Exam Cardiovascular:     Rate and Rhythm: Normal rate and regular rhythm.  Pulmonary:     Effort: Pulmonary effort is normal.     Breath sounds: Normal breath sounds.  Musculoskeletal:     Cervical back: Neck supple.  Skin:    General: Skin is warm and dry.          Assessment & Plan:      This visit occurred during the SARS-CoV-2 public health emergency.  Safety protocols were in place, including screening questions prior to the visit, additional usage of staff PPE, and extensive cleaning of exam room while observing appropriate contact time as indicated for  disinfecting solutions.

## 2021-11-02 NOTE — Assessment & Plan Note (Signed)
Uncontrolled today with A1C of 10.3.  Commended her on diet changes and exercise, encouraged to continue.  Increase Basaglar to 25 units in AM for 3-5 days, then increase to 30 units thereafter. Continue 42 units in PM.  Increase Novolog to 12-15 units TID with meals.   Increase metformin XR to 1000 mg daily.  Continue Glipizide 10 mg BID.  Urine microalbumin due and pending today.  Discussed to resume statin. Foot and eye exams UTD.  Follow up in March 2023

## 2021-11-02 NOTE — Assessment & Plan Note (Signed)
She discontinued rosuvastatin, discussed to resume for prevention of hypertriglyceridemia contributing to pancreatitis and for CVD prevention.  Resume rosuvastatin 20 mg.  Check lipids at upcoming visit.

## 2021-11-02 NOTE — Assessment & Plan Note (Signed)
Intermittent since pancreatitis episodes.  Agree to refill Zofran 8 mg ODT for PRN use. She will use sparingly.

## 2021-11-03 LAB — MICROALBUMIN / CREATININE URINE RATIO
Creatinine,U: 117 mg/dL
Microalb Creat Ratio: 0.7 mg/g (ref 0.0–30.0)
Microalb, Ur: 0.8 mg/dL (ref 0.0–1.9)

## 2021-12-04 ENCOUNTER — Other Ambulatory Visit: Payer: Self-pay | Admitting: Internal Medicine

## 2021-12-04 DIAGNOSIS — K219 Gastro-esophageal reflux disease without esophagitis: Secondary | ICD-10-CM

## 2021-12-11 ENCOUNTER — Other Ambulatory Visit: Payer: Self-pay

## 2021-12-11 ENCOUNTER — Ambulatory Visit (INDEPENDENT_AMBULATORY_CARE_PROVIDER_SITE_OTHER): Payer: PRIVATE HEALTH INSURANCE | Admitting: Primary Care

## 2021-12-11 ENCOUNTER — Other Ambulatory Visit: Payer: Self-pay | Admitting: Internal Medicine

## 2021-12-11 ENCOUNTER — Encounter: Payer: Self-pay | Admitting: Primary Care

## 2021-12-11 VITALS — BP 130/82 | HR 110 | Temp 98.5°F | Resp 16 | Ht 65.0 in | Wt 164.4 lb

## 2021-12-11 DIAGNOSIS — K861 Other chronic pancreatitis: Secondary | ICD-10-CM

## 2021-12-11 DIAGNOSIS — Z1231 Encounter for screening mammogram for malignant neoplasm of breast: Secondary | ICD-10-CM

## 2021-12-11 DIAGNOSIS — E785 Hyperlipidemia, unspecified: Secondary | ICD-10-CM

## 2021-12-11 DIAGNOSIS — K219 Gastro-esophageal reflux disease without esophagitis: Secondary | ICD-10-CM

## 2021-12-11 DIAGNOSIS — I10 Essential (primary) hypertension: Secondary | ICD-10-CM

## 2021-12-11 DIAGNOSIS — Z794 Long term (current) use of insulin: Secondary | ICD-10-CM

## 2021-12-11 DIAGNOSIS — Z0001 Encounter for general adult medical examination with abnormal findings: Secondary | ICD-10-CM | POA: Insufficient documentation

## 2021-12-11 DIAGNOSIS — Z0000 Encounter for general adult medical examination without abnormal findings: Secondary | ICD-10-CM

## 2021-12-11 DIAGNOSIS — G4733 Obstructive sleep apnea (adult) (pediatric): Secondary | ICD-10-CM

## 2021-12-11 DIAGNOSIS — E119 Type 2 diabetes mellitus without complications: Secondary | ICD-10-CM

## 2021-12-11 DIAGNOSIS — E538 Deficiency of other specified B group vitamins: Secondary | ICD-10-CM

## 2021-12-11 DIAGNOSIS — F331 Major depressive disorder, recurrent, moderate: Secondary | ICD-10-CM

## 2021-12-11 DIAGNOSIS — E1165 Type 2 diabetes mellitus with hyperglycemia: Secondary | ICD-10-CM

## 2021-12-11 DIAGNOSIS — R000 Tachycardia, unspecified: Secondary | ICD-10-CM

## 2021-12-11 DIAGNOSIS — R5382 Chronic fatigue, unspecified: Secondary | ICD-10-CM

## 2021-12-11 LAB — COMPREHENSIVE METABOLIC PANEL
ALT: 16 U/L (ref 0–35)
AST: 15 U/L (ref 0–37)
Albumin: 4.4 g/dL (ref 3.5–5.2)
Alkaline Phosphatase: 91 U/L (ref 39–117)
BUN: 9 mg/dL (ref 6–23)
CO2: 29 mEq/L (ref 19–32)
Calcium: 9.6 mg/dL (ref 8.4–10.5)
Chloride: 99 mEq/L (ref 96–112)
Creatinine, Ser: 0.77 mg/dL (ref 0.40–1.20)
GFR: 92.44 mL/min (ref >60.00)
Glucose, Bld: 232 mg/dL — ABNORMAL HIGH (ref 70–99)
Potassium: 3.9 mEq/L (ref 3.5–5.1)
Sodium: 139 mEq/L (ref 135–145)
Total Bilirubin: 0.6 mg/dL (ref 0.2–1.2)
Total Protein: 6.8 g/dL (ref 6.0–8.3)

## 2021-12-11 LAB — CBC
HCT: 39.5 % (ref 36.0–46.0)
Hemoglobin: 12.5 g/dL (ref 12.0–15.0)
MCHC: 31.7 g/dL (ref 30.0–36.0)
MCV: 73.7 fl — ABNORMAL LOW (ref 78.0–100.0)
Platelets: 331 10*3/uL (ref 150.0–400.0)
RBC: 5.35 Mil/uL — ABNORMAL HIGH (ref 3.87–5.11)
RDW: 16.7 % — ABNORMAL HIGH (ref 11.5–15.5)
WBC: 9.5 10*3/uL (ref 4.0–10.5)

## 2021-12-11 LAB — LIPID PANEL
Cholesterol: 299 mg/dL — ABNORMAL HIGH (ref 0–200)
HDL: 40.2 mg/dL (ref >39.00)
Total CHOL/HDL Ratio: 7
Triglycerides: 594 mg/dL — ABNORMAL HIGH (ref 0.0–149.0)

## 2021-12-11 LAB — VITAMIN B12: Vitamin B-12: 181 pg/mL — ABNORMAL LOW (ref 211–911)

## 2021-12-11 LAB — TSH: TSH: 2.09 u[IU]/mL (ref 0.35–5.50)

## 2021-12-11 LAB — LDL CHOLESTEROL, DIRECT: Direct LDL: 186 mg/dL

## 2021-12-11 MED ORDER — NOVOLOG FLEXPEN 100 UNIT/ML ~~LOC~~ SOPN: 20.0000 [IU] | PEN_INJECTOR | Freq: Three times a day (TID) | SUBCUTANEOUS | 1 refills | Status: DC

## 2021-12-11 MED ORDER — PANTOPRAZOLE SODIUM 40 MG PO TBEC: 40.0000 mg | DELAYED_RELEASE_TABLET | Freq: Two times a day (BID) | ORAL | 0 refills | Status: DC

## 2021-12-11 MED ORDER — BASAGLAR KWIKPEN 100 UNIT/ML ~~LOC~~ SOPN: PEN_INJECTOR | SUBCUTANEOUS | 1 refills | Status: DC

## 2021-12-11 MED ORDER — PRAVASTATIN SODIUM 40 MG PO TABS: 40.0000 mg | ORAL_TABLET | Freq: Every day | ORAL | 3 refills | Status: DC

## 2021-12-11 NOTE — Assessment & Plan Note (Signed)
Following with GI through [facility]. ? ?Reviewed ERCP from February 2023 through [facility].  ? ? ?

## 2021-12-11 NOTE — Assessment & Plan Note (Signed)
Continue pantoprazole 40 mg BID. ?Follows with GI. ?

## 2021-12-11 NOTE — Assessment & Plan Note (Signed)
Immunizations up-to-date. ?Pap smear up-to-date, follows with GYN. ?Mammogram due, ordered and pending today. ? ?Colonoscopy due, patient declines. ? ?Discussed the importance of a healthy diet and regular exercise in order for weight loss, and to reduce the risk of further co-morbidity. ? ?Exam today stable. ?Labs reviewed and pending ?

## 2021-12-11 NOTE — Assessment & Plan Note (Signed)
No use of CPAP machine as it is broken. ?Last sleep study was years ago. ? ?Referral placed for pulmonology.  ?

## 2021-12-11 NOTE — Assessment & Plan Note (Signed)
Likely a combination of uncontrolled diabetes, untreated sleep apnea, chronic and recurrent pancreatitis ? ?Checking labs today including TSH, CBC, CMP, B12. ?

## 2021-12-11 NOTE — Assessment & Plan Note (Signed)
Following with [facility]. ?Feels well managed.  ? ?Continue Abilify 10 mg daily, venlafaxine ER 300 mg daily. ?

## 2021-12-11 NOTE — Assessment & Plan Note (Signed)
No longer taking rosuvastatin due to myalgias.  ?Also intolerant to atorvastatin. ? ?Rx for pravastatin 40 mg sent to pharmacy. ?She will update if this causes myalgias. ?

## 2021-12-11 NOTE — Assessment & Plan Note (Signed)
Controlled. ? ?Continue metoprolol tartrate 25 mg twice daily. ?

## 2021-12-11 NOTE — Progress Notes (Signed)
Subjective:    Patient ID: Christine Cook, female    DOB: 09/12/1976, 46 y.o.   MRN: [id number]  HPI  Christine Cook is a very pleasant 46 y.o. female who presents today for complete physical and follow up of chronic conditions.  She is compliant to her prescribed diabetes regimen. A1C of 10.3 in January 2023.  She has been uncontrolled for over 1 year.  She has never been tested for type 1 diabetes.  She continues to struggle with recurrent chronic pancreatitis, follow with GI.  She is checking glucose readings 2-3 times daily and is getting readings of:  2 hours after lunch: 280's-330's Before dinner: 230's  Immunizations: -Tetanus: 2019 -Influenza: Completed this season  -Covid-19: 3 vaccines   Diet: Fair diet.  Exercise: No regular exercise. Active.   Eye exam: Completes annually  Dental exam: Completes semi-annually   Pap Smear: Completed in December 2020, follows with GYN Mammogram: Completed years ago Colonoscopy: Never completed, declines today  BP Readings from Last 3 Encounters:  12/11/21 130/82  11/02/21 136/78  06/25/21 (!) 147/93       Review of Systems  Constitutional:  Negative for unexpected weight change.  HENT:  Negative for rhinorrhea.   Eyes:  Negative for visual disturbance.  Respiratory:  Negative for cough and shortness of breath.   Cardiovascular:  Negative for chest pain.  Gastrointestinal:  Negative for constipation and diarrhea.  Genitourinary:  Negative for difficulty urinating.  Musculoskeletal:  Negative for arthralgias and myalgias.  Skin:  Negative for rash.  Allergic/Immunologic: Negative for environmental allergies.  Neurological:  Positive for headaches. Negative for dizziness.  Psychiatric/Behavioral:  The patient is not nervous/anxious.         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   Sleep apnea with hypersomnolence    CPAP study 11-28-09,  10-02-2009 AHI was 16.5, pressure to    Tachycardia     Social History   Socioeconomic History   Marital status: Single    Spouse name: Not on file   Number of children: 1   Years of education: College   Highest education level: Not on file  Occupational History   Occupation: Support specialist    Employer: [employer]  Tobacco Use   Smoking status: Former    Packs/day: 0.25    Years: 2.00    Pack years: 0.50    Types: Cigarettes    Quit date: 2017    Years since quitting: 6.1   Smokeless tobacco: Never  Vaping Use   Vaping Use: Never used  Substance and Sexual Activity   Alcohol use: No   Drug use: No   Sexual activity: Yes    Birth control/protection: I.U.D.  Other Topics Concern   Not on file  Social History Narrative   Single.   One daughter.   Works at [employer].   Enjoys relaxing, spending time with family.   Social Determinants of Health   Financial Resource Strain: Not on file  Food Insecurity: Not on file  Transportation Needs: Not on file  Physical Activity: Not on file  Stress: Not on file  Social Connections: Not on file  Intimate Partner Violence: Not on file    Past Surgical History:  Procedure Laterality Date   ADENOIDECTOMY     BLADDER SURGERY     CHOLECYSTECTOMY     KNEE SURGERY     Left knee x 2    LEEP  10/2016   CIN 1   TONSILLECTOMY      Family History  Problem Relation Age of Onset   Asthma Mother    Hyperthyroidism Mother    Rheum arthritis Mother    Graves' disease Mother    Sjogren's syndrome Mother    Migraines Mother    Asthma Daughter    Migraines Daughter    Colon cancer Neg Hx      Allergies  Allergen Reactions   Codeine Hives and Nausea And Vomiting   Morphine Hives   Neurontin [Gabapentin] Other (See Comments)    syncope   Other Itching    Current Outpatient Medications on File Prior to Visit  Medication Sig Dispense Refill   acetaminophen (TYLENOL) 500 MG tablet Take by mouth.     ARIPiprazole (ABILIFY) 10 MG tablet Take 10 mg by mouth daily.     blood glucose meter kit and supplies KIT Dispense based on patient and insurance preference. Use up to four times daily as directed. (FOR ICD-9 250.00, 250.01). 1 each 0   glipiZIDE (GLUCOTROL) 10 MG tablet TAKE 1 TABLET BY MOUTH TWICE DAILY BEFORE A MEAL FOR DIABETES. (Patient taking differently: TAKE 1 TABLET BY MOUTH TWICE DAILY BEFORE A MEAL FOR DIABETES. **only taking once daily) 180 tablet 3   glucose blood (ONETOUCH ULTRA) test strip USE TO CHECK BLOOD SUGAR UP TO FOUR TIMES DAILY AS DIRECTED 400 each 0   insulin aspart (NOVOLOG FLEXPEN) 100 UNIT/ML FlexPen Inject 12-15 Units into the skin 3 (three) times daily with meals. For diabetes. 30 mL 1   Insulin Glargine (BASAGLAR KWIKPEN) 100 UNIT/ML INJECT 30 UNITS SUBCUTANEOUSLY IN THE MORNING AND 42 IN THE EVENING FOR DIABETES. 45 mL 1   Insulin Pen Needle (PEN NEEDLES) 31G X 6 MM MISC Use five times daily with insulin pens. 500 each 3   Lancets (ONETOUCH DELICA PLUS [id number]) MISC SMARTSIG:Topical 1 to 4 Times Daily     levonorgestrel (MIRENA) 20 MCG/24HR IUD 1 each by Intrauterine route once.     metFORMIN (GLUCOPHAGE-XR) 500 MG 24 hr tablet Take 2 tablets (1,000 mg total) by mouth daily with breakfast. For diabetes. 180 tablet 1   metoprolol tartrate (LOPRESSOR) 25 MG tablet Take 1 tablet (25 mg total) by mouth 2 (two) times daily. For heart rate. 180 tablet 3   Omega-3 Fatty Acids (FISH OIL) 1000 MG CAPS Take 2 capsules (2,000 mg total) by mouth 2 (two) times daily. 360 capsule 0   ondansetron (ZOFRAN ODT) 8 MG disintegrating tablet Take 1 tablet (8 mg total)  by mouth every 8 (eight) hours as needed for nausea or vomiting. 20 tablet 0   pantoprazole (PROTONIX) 40 MG tablet TAKE 1 TABLET BY MOUTH ONCE DAILY FOR HEARTBURN 90 tablet 0   venlafaxine (EFFEXOR) 100 MG tablet Take 300 mg by mouth daily.     Cholecalciferol (VITAMIN D3) 2000 units TABS Take by mouth. (Patient not taking: Reported on 11/02/2021)     Cyanocobalamin (B-12 PO) Take 1,000 mcg by mouth daily. (Patient not taking:  Reported on 05/05/2021)     rosuvastatin (CRESTOR) 20 MG tablet Take 1 tablet (20 mg total) by mouth daily. For cholesterol. (Patient not taking: Reported on 11/02/2021) 90 tablet 3   [DISCONTINUED] atorvastatin (LIPITOR) 80 MG tablet Take 1 tablet by mouth every evening for cholesterol. 90 tablet 2   [DISCONTINUED] diphenhydrAMINE (BENADRYL) 25 mg capsule Take 2 capsules (50 mg total) by mouth every 6 (six) hours as needed. 60 capsule 0   [DISCONTINUED] metoCLOPramide (REGLAN) 10 MG tablet Take 1 tablet (10 mg total) by mouth every 6 (six) hours as needed. 30 tablet 0   [DISCONTINUED] potassium chloride SA (K-DUR) 20 MEQ tablet Take 1 tablet by mouth once daily 90 tablet 1   [DISCONTINUED] QUEtiapine (SEROQUEL XR) 50 MG TB24 24 hr tablet Take 1 tablet (50 mg total) by mouth at bedtime.     No current facility-administered medications on file prior to visit.    Resp 16    Ht '5\' 5"'  (1.651 m)    Wt 164 lb 6.4 oz (74.6 kg)    BMI 27.36 kg/m  Objective:   Physical Exam HENT:     Right Ear: Tympanic membrane and ear canal normal.     Left Ear: Tympanic membrane and ear canal normal.     Nose: Nose normal.  Eyes:     Conjunctiva/sclera: Conjunctivae normal.     Pupils: Pupils are equal, round, and reactive to light.  Neck:     Thyroid: No thyromegaly.  Cardiovascular:     Rate and Rhythm: Normal rate and regular rhythm.     Heart sounds: No murmur heard. Pulmonary:     Effort: Pulmonary effort is normal.     Breath sounds: Normal breath sounds. No rales.  Abdominal:      General: Bowel sounds are normal.     Palpations: Abdomen is soft.     Tenderness: There is no abdominal tenderness.  Musculoskeletal:        General: Normal range of motion.     Cervical back: Neck supple.  Lymphadenopathy:     Cervical: No cervical adenopathy.  Skin:    General: Skin is warm and dry.     Findings: No rash.  Neurological:     Mental Status: She is alert and oriented to person, place, and time.     Cranial Nerves: No cranial nerve deficit.     Deep Tendon Reflexes: Reflexes are normal and symmetric.  Psychiatric:        Mood and Affect: Mood normal.          Assessment & Plan:      This visit occurred during the SARS-CoV-2 public health emergency.  Safety protocols were in place, including screening questions prior to the visit, additional usage of staff PPE, and extensive cleaning of exam room while observing appropriate contact time as indicated for disinfecting solutions.

## 2021-12-11 NOTE — Patient Instructions (Signed)
Stop by the lab prior to leaving today. I will notify you of your results once received.  ? ?We increased the dose of your Basaglar insulin to 35 units in the morning, continue 42 units at night. ? ?We increase the dose of your NovoLog mealtime insulin to 20 units 3 times daily before meals. ? ?Send me some updated glucose readings in 2 weeks via MyChart. ? ?Call the [facility] to schedule your mammogram.  ? ?Schedule a follow-up visit for diabetes in late April/early May. ? ?It was a pleasure to see you today! ? ?Preventive Care 56-50 Years Old, Female ?Preventive care refers to lifestyle choices and visits with your health care provider that can promote health and wellness. Preventive care visits are also called wellness exams. ?What can I expect for my preventive care visit? ?Counseling ?Your health care provider may ask you questions about your: ?Medical history, including: ?Past medical problems. ?Family medical history. ?Pregnancy history. ?Current health, including: ?Menstrual cycle. ?Method of birth control. ?Emotional well-being. ?Home life and relationship well-being. ?Sexual activity and sexual health. ?Lifestyle, including: ?Alcohol, nicotine or tobacco, and drug use. ?Access to firearms. ?Diet, exercise, and sleep habits. ?Work and work Statistician. ?Sunscreen use. ?Safety issues such as seatbelt and bike helmet use. ?Physical exam ?Your health care provider will check your: ?Height and weight. These may be used to calculate your BMI (body mass index). BMI is a measurement that tells if you are at a healthy weight. ?Waist circumference. This measures the distance around your waistline. This measurement also tells if you are at a healthy weight and may help predict your risk of certain diseases, such as type 2 diabetes and high blood pressure. ?Heart rate and blood pressure. ?Body temperature. ?Skin for abnormal spots. ?What immunizations do I need? ?Vaccines are usually given at various ages,  according to a schedule. Your health care provider will recommend vaccines for you based on your age, medical history, and lifestyle or other factors, such as travel or where you work. ?What tests do I need? ?Screening ?Your health care provider may recommend screening tests for certain conditions. This may include: ?Lipid and cholesterol levels. ?Diabetes screening. This is done by checking your blood sugar (glucose) after you have not eaten for a while (fasting). ?Pelvic exam and Pap test. ?Hepatitis B test. ?Hepatitis C test. ?HIV (human immunodeficiency virus) test. ?STI (sexually transmitted infection) testing, if you are at risk. ?Lung cancer screening. ?Colorectal cancer screening. ?Mammogram. Talk with your health care provider about when you should start having regular mammograms. This may depend on whether you have a family history of breast cancer. ?BRCA-related cancer screening. This may be done if you have a family history of breast, ovarian, tubal, or peritoneal cancers. ?Bone density scan. This is done to screen for osteoporosis. ?Talk with your health care provider about your test results, treatment options, and if necessary, the need for more tests. ?Follow these instructions at home: ?Eating and drinking ? ?Eat a diet that includes fresh fruits and vegetables, whole grains, lean protein, and low-fat dairy products. ?Take vitamin and mineral supplements as recommended by your health care provider. ?Do not drink alcohol if: ?Your health care provider tells you not to drink. ?You are pregnant, may be pregnant, or are planning to become pregnant. ?If you drink alcohol: ?Limit how much you have to 0-1 drink a day. ?Know how much alcohol is in your drink. In the [place] one drink equals one 12 oz bottle of beer (355 mL), one  5 oz glass of wine (148 mL), or one 1? oz glass of hard liquor (44 mL). ?Lifestyle ?Brush your teeth every morning and night with fluoride toothpaste. Floss one time each  day. ?Exercise for at least 30 minutes 5 or more days each week. ?Do not use any products that contain nicotine or tobacco. These products include cigarettes, chewing tobacco, and vaping devices, such as e-cigarettes. If you need help quitting, ask your health care provider. ?Do not use drugs. ?If you are sexually active, practice safe sex. Use a condom or other form of protection to prevent STIs. ?If you do not wish to become pregnant, use a form of birth control. If you plan to become pregnant, see your health care provider for a prepregnancy visit. ?Take aspirin only as told by your health care provider. Make sure that you understand how much to take and what form to take. Work with your health care provider to find out whether it is safe and beneficial for you to take aspirin daily. ?Find healthy ways to manage stress, such as: ?Meditation, yoga, or listening to music. ?Journaling. ?Talking to a trusted person. ?Spending time with friends and family. ?Minimize exposure to UV radiation to reduce your risk of skin cancer. ?Safety ?Always wear your seat belt while driving or riding in a vehicle. ?Do not drive: ?If you have been drinking alcohol. Do not ride with someone who has been drinking. ?When you are tired or distracted. ?While texting. ?If you have been using any mind-altering substances or drugs. ?Wear a helmet and other protective equipment during sports activities. ?If you have firearms in your house, make sure you follow all gun safety procedures. ?Seek help if you have been physically or sexually abused. ?What's next? ?Visit your health care provider once a year for an annual wellness visit. ?Ask your health care provider how often you should have your eyes and teeth checked. ?Stay up to date on all vaccines. ?This information is not intended to replace advice given to you by your health care provider. Make sure you discuss any questions you have with your health care provider. ?Document Revised:  03/18/2021 Document Reviewed: 03/18/2021 ?Elsevier Patient Education ? [place]. ? ?

## 2021-12-11 NOTE — Assessment & Plan Note (Signed)
Uncontrolled with recurrent bouts of pancreatitis. ? ?We will check labs today for late adult onset type 1 diabetes. ?A1c of 10.3 6 weeks ago, has been uncontrolled for over 1 year despite compliance to insulin regimen. ? ?Continue metformin XR 1000 mg a.m., continue glipizide 10 mg twice daily, increase Basaglar insulin to 35 units in the morning and continue 42 units at night.  Increase NovoLog to 20 units 3 times daily before meals. ? ?She will update via MyChart in 2 weeks regarding glucose readings. ? ?Follow-up in late April for repeat A1c and diabetes check. ?

## 2021-12-11 NOTE — Assessment & Plan Note (Signed)
Not currently on supplementation. ?Repeat B12 level pending. ?

## 2021-12-15 LAB — C-PEPTIDE: C-Peptide: 2.29 ng/mL (ref 0.80–3.85)

## 2021-12-15 LAB — GLUTAMIC ACID DECARBOXYLASE AUTO ABS: Glutamic Acid Decarb Ab: <5 IU/mL (ref <5)

## 2021-12-21 DIAGNOSIS — K219 Gastro-esophageal reflux disease without esophagitis: Secondary | ICD-10-CM

## 2021-12-21 MED ORDER — PANTOPRAZOLE SODIUM 40 MG PO TBEC: 40.0000 mg | DELAYED_RELEASE_TABLET | Freq: Two times a day (BID) | ORAL | 0 refills | Status: DC

## 2022-01-09 ENCOUNTER — Telehealth: Payer: Self-pay

## 2022-01-28 ENCOUNTER — Telehealth: Payer: Self-pay

## 2022-01-28 NOTE — Telephone Encounter (Signed)
Lm for patient to ask if she has had previous sleep study or cpap. ? ?

## 2022-01-29 NOTE — Telephone Encounter (Signed)
Lm x2 for patient.  Will close encounter per office protocol.   

## 2022-02-01 ENCOUNTER — Institutional Professional Consult (permissible substitution): Payer: PRIVATE HEALTH INSURANCE | Admitting: Primary Care

## 2022-02-02 ENCOUNTER — Ambulatory Visit: Payer: PRIVATE HEALTH INSURANCE | Admitting: Primary Care

## 2022-02-11 ENCOUNTER — Ambulatory Visit: Payer: PRIVATE HEALTH INSURANCE

## 2022-02-11 ENCOUNTER — Ambulatory Visit: Payer: Commercial Managed Care - HMO

## 2022-02-12 ENCOUNTER — Other Ambulatory Visit: Payer: Self-pay

## 2022-02-12 ENCOUNTER — Ambulatory Visit
Admission: EM | Admit: 2022-02-12 | Discharge: 2022-02-12 | Disposition: A | Discharge status: Home / Self Care | Payer: Commercial Managed Care - HMO | Attending: Physician Assistant | Admitting: Physician Assistant

## 2022-02-12 DIAGNOSIS — J069 Acute upper respiratory infection, unspecified: Secondary | ICD-10-CM

## 2022-02-12 DIAGNOSIS — R051 Acute cough: Secondary | ICD-10-CM

## 2022-02-12 DIAGNOSIS — R0981 Nasal congestion: Secondary | ICD-10-CM

## 2022-02-12 MED ORDER — IPRATROPIUM BROMIDE 0.06 % NA SOLN: 2.0000 | Freq: Four times a day (QID) | NASAL | 0 refills | Status: DC

## 2022-02-12 MED ORDER — BENZONATATE 200 MG PO CAPS: 200.0000 mg | ORAL_CAPSULE | Freq: Three times a day (TID) | ORAL | 0 refills | Status: DC | PRN

## 2022-02-12 MED ORDER — PROMETHAZINE-DM 6.25-15 MG/5ML PO SYRP: 5.0000 mL | ORAL_SOLUTION | Freq: Four times a day (QID) | ORAL | 0 refills | Status: DC | PRN

## 2022-02-12 NOTE — Discharge Instructions (Signed)
URI/COLD SYMPTOMS: Your exam today is consistent with a viral illness. Antibiotics are not indicated at this time. Use medications as directed, including cough syrup, nasal saline, and decongestants. Your symptoms should improve over the next few days and resolve within another 7-10 days. Increase rest and fluids. F/u if symptoms worsen or predominate such as sore throat, ear pain, productive cough, shortness of breath, or if you develop high fevers or worsening fatigue over the next several days.   

## 2022-02-12 NOTE — ED Provider Notes (Signed)
?[facility] ? ? ? ?CSN: [id number] ?Arrival date & time: 02/12/22  1113 ? ? ?  ? ?History   ?Chief Complaint ?Chief Complaint  ?Patient presents with  ? Cough  ? ? ?HPI ?Christine Cook is a 45 y.o. female presenting for 4-day history of nasal congestion and productive cough.  Cough is productive of greenish-brown mucus.  Patient also reports fatigue, sore and scratchy throat.  No associated fevers, chest pain, breathing difficulty, vomiting or diarrhea reported.  Has been taking over-the-counter Mucinex DM.  No sick contacts and no known exposure to influenza or COVID-19.  Would like to hold off on testing for COVID-19.  Medical history significant for diabetes, anxiety, depression, hypertension, hyperlipidemia, narcolepsy, sleep apnea, chronic fatigue and chronic pancreatitis. ? ?HPI ? ?Past Medical History:  ?Diagnosis Date  ? Anxiety   ? Benzodiazepine overdose   ? Depression   ? Diabetes ([place])   ? Fatty liver disease, nonalcoholic   ? Heart murmur   ? Heart palpitations   ? High cholesterol   ? 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 Hypertension   ? Interstitial cystitis   ? Irritable bowel syndrome with diarrhea   ? 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 Migraine   ? Narcolepsy 03/2014  ? 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 Sleep apnea with hypersomnolence   ? CPAP study 11-28-09,  10-02-2009 AHI was 16.5, pressure to   ? Tachycardia   ? ? ?Patient Active Problem List  ? Diagnosis Date Noted  ? Chronic fatigue 12/11/2021  ? Preventative health care 12/11/2021  ? Uncontrolled type 2 diabetes mellitus 05/05/2021  ? 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 Type 2 diabetes mellitus with hyperglycemia, with long-term current use of insulin ([place]) 01/22/2021  ? Chronic pancreatitis ([place]) 01/13/2021  ? Screening for cervical cancer 09/11/2019  ? Arthralgia of right temporomandibular joint 02/15/2019  ? Plantar fasciitis 01/02/2018  ? Back pain 01/02/2018  ? Hypokalemia 11/29/2017  ? Nausea 12/09/2016   ? Cervical dysplasia 12/01/2016  ? Hyperlipidemia 08/05/2016  ? Tachycardia 08/05/2016  ? OSA (obstructive sleep apnea) 07/12/2016  ? Narcolepsy 07/12/2016  ? Vitamin B12 deficiency 07/12/2016  ? Vitamin D deficiency 07/12/2016  ? Depression 07/11/2016  ? Impulse control disorder (gambling) 07/11/2016  ? HTN (hypertension) 07/09/2016  ? GERD (gastroesophageal reflux disease) 01/21/2009  ? ? ?Past Surgical History:  ?Procedure Laterality Date  ? ADENOIDECTOMY    ? BLADDER SURGERY    ? CHOLECYSTECTOMY    ? KNEE SURGERY    ? Left knee x 2   ? LEEP  10/2016  ? CIN 1  ? TONSILLECTOMY    ? ? ?OB History   ?No obstetric history on file. ?  ? ? ? ?Home Medications   ? ?Prior to Admission medications   ?Medication Sig Start Date End Date Taking? Authorizing Provider  ?acetaminophen (TYLENOL) 500 MG tablet Take by mouth. 01/16/21  Yes [provider]  ?ARIPiprazole (ABILIFY) 10 MG tablet Take 10 mg by mouth daily.   Yes [provider]  ?benzonatate (TESSALON) 200 MG capsule Take 1 capsule (200 mg total) by mouth 3 (three) times daily as needed for cough. 02/12/22  Yes Laurene Footman B, PA-C  ?blood glucose meter kit and supplies KIT Dispense based on patient and insurance preference. Use up to four times daily as directed. (FOR ICD-9 250.00, 250.01). 10/28/20  Yes Pleas Koch, NP  ?Cholecalciferol (VITAMIN D3) 2000 units TABS Take by mouth.  Yes [provider]  ?Cyanocobalamin (B-12 PO) Take 1,000 mcg by mouth daily.   Yes [provider]  ?glipiZIDE (GLUCOTROL) 10 MG tablet TAKE 1 TABLET BY MOUTH TWICE DAILY BEFORE A MEAL FOR DIABETES. ?Patient taking differently: TAKE 1 TABLET BY MOUTH TWICE DAILY BEFORE A MEAL FOR DIABETES. **only taking once daily 10/28/20  Yes Pleas Koch, NP  ?glucose blood (ONETOUCH ULTRA) test strip USE TO CHECK BLOOD SUGAR UP TO FOUR TIMES DAILY AS DIRECTED 07/01/21  Yes Pleas Koch, NP  ?insulin aspart (NOVOLOG FLEXPEN) 100 UNIT/ML FlexPen  Inject 20 Units into the skin 3 (three) times daily with meals. For diabetes. 12/11/21  Yes Pleas Koch, NP  ?Insulin Glargine (BASAGLAR KWIKPEN) 100 UNIT/ML INJECT 35 UNITS SUBCUTANEOUSLY IN THE MORNING AND 42 IN THE EVENING FOR DIABETES. 12/11/21  Yes Pleas Koch, NP  ?Insulin Pen Needle (PEN NEEDLES) 31G X 6 MM MISC Use five times daily with insulin pens. 11/02/21  Yes Pleas Koch, NP  ?ipratropium (ATROVENT) 0.06 % nasal spray Place 2 sprays into both nostrils 4 (four) times daily. 02/12/22  Yes Danton Clap, PA-C  ?Lancets (ONETOUCH DELICA PLUS [id number]) MISC SMARTSIG:Topical 1 to 4 Times Daily 10/29/20  Yes [provider]  ?levonorgestrel (MIRENA) 20 MCG/24HR IUD 1 each by Intrauterine route once. 01/16/14  Yes Hassell Done, FNP  ?metFORMIN (GLUCOPHAGE-XR) 500 MG 24 hr tablet Take 2 tablets (1,000 mg total) by mouth daily with breakfast. For diabetes. 11/02/21  Yes Pleas Koch, NP  ?metoprolol tartrate (LOPRESSOR) 25 MG tablet Take 1 tablet (25 mg total) by mouth 2 (two) times daily. For heart rate. 10/28/20  Yes Pleas Koch, NP  ?Omega-3 Fatty Acids (FISH OIL) 1000 MG CAPS Take 2 capsules (2,000 mg total) by mouth 2 (two) times daily. 07/01/21  Yes Pleas Koch, NP  ?ondansetron (ZOFRAN ODT) 8 MG disintegrating tablet Take 1 tablet (8 mg total) by mouth every 8 (eight) hours as needed for nausea or vomiting. 11/02/21  Yes Pleas Koch, NP  ?pantoprazole (PROTONIX) 40 MG tablet Take 1 tablet (40 mg total) by mouth 2 (two) times daily before a meal. For heartburn 12/21/21  Yes Pleas Koch, NP  ?pravastatin (PRAVACHOL) 40 MG tablet Take 1 tablet (40 mg total) by mouth daily. for cholesterol. 12/11/21  Yes Pleas Koch, NP  ?promethazine-dextromethorphan (PROMETHAZINE-DM) 6.25-15 MG/5ML syrup Take 5 mLs by mouth 4 (four) times daily as needed. 02/12/22  Yes Danton Clap, PA-C  ?venlafaxine (EFFEXOR) 100 MG tablet Take 300 mg by mouth daily.   Yes  [provider]  ?atorvastatin (LIPITOR) 80 MG tablet Take 1 tablet by mouth every evening for cholesterol. 03/26/19 08/06/20  Pleas Koch, NP  ?diphenhydrAMINE (BENADRYL) 25 mg capsule Take 2 capsules (50 mg total) by mouth every 6 (six) hours as needed. 04/07/20 08/06/20  Carrie Mew, MD  ?metoCLOPramide (REGLAN) 10 MG tablet Take 1 tablet (10 mg total) by mouth every 6 (six) hours as needed. 04/07/20 08/06/20  Carrie Mew, MD  ?potassium chloride SA (K-DUR) 20 MEQ tablet Take 1 tablet by mouth once daily 04/12/19 08/06/20  Pleas Koch, NP  ?QUEtiapine (SEROQUEL XR) 50 MG TB24 24 hr tablet Take 1 tablet (50 mg total) by mouth at bedtime. 07/09/19 08/06/20  Tonia Ghent, MD  ? ? ?Family History ?Family History  ?Problem Relation Age of Onset  ? Asthma Mother   ? Hyperthyroidism Mother   ? Rheum arthritis Mother   ?  Graves' disease Mother   ? Sjogren's syndrome Mother   ? Migraines Mother   ? Asthma Daughter   ? Migraines Daughter   ? Colon cancer Neg Hx   ? ? ?Social History ?Social History  ? ?Tobacco Use  ? Smoking status: Former  ?  Packs/day: 0.25  ?  Years: 2.00  ?  Pack years: 0.50  ?  Types: Cigarettes  ?  Quit date: 2017  ?  Years since quitting: 6.3  ? Smokeless tobacco: Never  ?Vaping Use  ? Vaping Use: Never used  ?Substance Use Topics  ? Alcohol use: No  ? Drug use: No  ? ? ? ?Allergies   ?Codeine, Morphine, Neurontin [gabapentin], and Other ? ? ?Review of Systems ?Review of Systems  ?Constitutional:  Positive for fatigue. Negative for chills, diaphoresis and fever.  ?HENT:  Positive for congestion, rhinorrhea and sinus pressure. Negative for ear pain, sinus pain and sore throat.   ?Respiratory:  Positive for cough. Negative for shortness of breath.   ?Gastrointestinal:  Negative for abdominal pain, nausea and vomiting.  ?Musculoskeletal:  Negative for arthralgias and myalgias.  ?Skin:  Negative for rash.  ?Neurological:  Negative for weakness and headaches.  ? ? ?Physical  Exam ?Triage Vital Signs ?ED Triage Vitals  ?Enc Vitals Group  ?   BP   ?   Pulse   ?   Resp   ?   Temp   ?   Temp src   ?   SpO2   ?   Weight   ?   Height   ?   Head Circumference   ?   Pe[place]   ?   Pain [place]

## 2022-02-12 NOTE — ED Triage Notes (Signed)
Pt c/o cough with brown/green mucus, nasal congestion, x5days ?

## 2022-04-03 ENCOUNTER — Other Ambulatory Visit: Payer: Self-pay | Admitting: Primary Care

## 2022-04-03 DIAGNOSIS — E119 Type 2 diabetes mellitus without complications: Secondary | ICD-10-CM

## 2022-04-04 NOTE — Telephone Encounter (Signed)
Patient was supposed to be evaluated in April, she did not make an appointment.  She is to be seen ASAP for follow-up for diabetes.  Please schedule.  Let me know once this is done.

## 2022-04-08 NOTE — Telephone Encounter (Signed)
Looks like patient called back and scheduled app for 7/27

## 2022-04-14 ENCOUNTER — Ambulatory Visit
Admission: RE | Admit: 2022-04-14 | Discharge: 2022-04-14 | Disposition: A | Discharge status: Home / Self Care | Payer: Commercial Managed Care - HMO | Source: Ambulatory Visit | Attending: Emergency Medicine | Admitting: Emergency Medicine

## 2022-04-14 VITALS — BP 125/85 | HR 93 | Temp 98.8°F | Resp 16

## 2022-04-14 DIAGNOSIS — H109 Unspecified conjunctivitis: Secondary | ICD-10-CM | POA: Diagnosis not present

## 2022-04-14 MED ORDER — MOXIFLOXACIN HCL 0.5 % OP SOLN: 1.0000 [drp] | Freq: Three times a day (TID) | OPHTHALMIC | 0 refills | Status: DC

## 2022-04-14 NOTE — ED Triage Notes (Addendum)
Pt reports right eye redness, burning and itching beginning yesterday. Also reports eye drainage when she woke up. Has tried visine and a medicated eye drop for relief.

## 2022-04-14 NOTE — ED Provider Notes (Signed)
[facility]    CSN: [id number] Arrival date & time: 04/14/22  1151      History   Chief Complaint Chief Complaint  Patient presents with   Eye Problem    Entered by patient    HPI Christine Cook is a 46 y.o. female.   Patient presents with right eye pain, mild blurred vision, pruritus, erythema and discharge and drainage beginning 1 day ago.  Attempted use of Polytrim and Visine without any signs of improvement.  No known sick contacts.  Does not use contacts, wears glasses intermittently.  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   Sleep apnea with hypersomnolence    CPAP study 11-28-09,  10-02-2009 AHI was 16.5, pressure to    Tachycardia     Patient Active Problem List   Diagnosis Date Noted   Chronic fatigue 12/11/2021   Preventative health care 12/11/2021   Uncontrolled type 2 diabetes mellitus 05/05/2021   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   Type 2 diabetes mellitus with hyperglycemia, with long-term current use of insulin ([place]) 01/22/2021   Chronic pancreatitis ([place] ) 01/13/2021   Screening for cervical cancer 09/11/2019   Arthralgia of right temporomandibular joint 02/15/2019   Plantar fasciitis 01/02/2018   Back pain 01/02/2018   Hypokalemia 11/29/2017   Nausea 12/09/2016   Cervical dysplasia 12/01/2016   Hyperlipidemia 08/05/2016   Tachycardia 08/05/2016   OSA (obstructive sleep apnea) 07/12/2016   Narcolepsy 07/12/2016   Vitamin B12 deficiency 07/12/2016   Vitamin D deficiency 07/12/2016   Depression 07/11/2016   Impulse control  disorder (gambling) 07/11/2016   HTN (hypertension) 07/09/2016   GERD (gastroesophageal reflux disease) 01/21/2009    Past Surgical History:  Procedure Laterality Date   ADENOIDECTOMY     BLADDER SURGERY     CHOLECYSTECTOMY     KNEE SURGERY     Left knee x 2    LEEP  10/2016   CIN 1   TONSILLECTOMY      OB History   No obstetric history on file.      Home Medications    Prior to Admission medications   Medication Sig Start Date End Date Taking? Authorizing Provider  acetaminophen (TYLENOL) 500 MG tablet Take by mouth. 01/16/21   [provider]  ARIPiprazole (ABILIFY) 10 MG tablet Take 10 mg by mouth daily.    [provider]  benzonatate (TESSALON) 200 MG capsule Take 1 capsule (200 mg total) by mouth 3 (three) times daily as needed for cough. 02/12/22   Laurene Footman B, PA-C  blood glucose meter kit and supplies KIT Dispense based on patient and insurance preference. Use up to four times daily as directed. (FOR ICD-9 250.00, 250.01). 10/28/20   Pleas Koch, NP  Cholecalciferol (VITAMIN D3) 2000 units TABS Take by mouth.    [provider]  Cyanocobalamin (B-12 PO) Take 1,000 mcg by mouth daily.    [provider]  glipiZIDE (GLUCOTROL) 10 MG tablet TAKE 1 TABLET BY MOUTH TWICE DAILY  BEFORE A MEAL FOR DIABETES. Office visit required for further refills. 04/09/22   Pleas Koch, NP  glucose blood (ONETOUCH ULTRA) test strip USE TO CHECK BLOOD SUGAR UP TO FOUR TIMES DAILY AS DIRECTED 07/01/21   Pleas Koch, NP  insulin aspart (NOVOLOG FLEXPEN) 100 UNIT/ML FlexPen Inject 20 Units into the skin 3 (three) times daily with meals. For diabetes. 12/11/21   Pleas Koch, NP  Insulin Glargine (BASAGLAR KWIKPEN) 100 UNIT/ML INJECT 35 UNITS SUBCUTANEOUSLY IN THE MORNING AND 42 IN THE EVENING FOR DIABETES. 12/11/21   Pleas Koch, NP  Insulin Pen Needle (PEN NEEDLES) 31G X 6 MM MISC Use five times daily with insulin pens. 11/02/21    Pleas Koch, NP  ipratropium (ATROVENT) 0.06 % nasal spray Place 2 sprays into both nostrils 4 (four) times daily. 02/12/22   Laurene Footman B, PA-C  Lancets (ONETOUCH DELICA PLUS [id number]) MISC SMARTSIG:Topical 1 to 4 Times Daily 10/29/20   [provider]  levonorgestrel (MIRENA) 20 MCG/24HR IUD 1 each by Intrauterine route once. 01/16/14   Hassell Done, FNP  metFORMIN (GLUCOPHAGE-XR) 500 MG 24 hr tablet Take 2 tablets (1,000 mg total) by mouth daily with breakfast. For diabetes. 11/02/21   Pleas Koch, NP  metoprolol tartrate (LOPRESSOR) 25 MG tablet Take 1 tablet (25 mg total) by mouth 2 (two) times daily. For heart rate. 10/28/20   Pleas Koch, NP  Omega-3 Fatty Acids (FISH OIL) 1000 MG CAPS Take 2 capsules (2,000 mg total) by mouth 2 (two) times daily. 07/01/21   Pleas Koch, NP  ondansetron (ZOFRAN ODT) 8 MG disintegrating tablet Take 1 tablet (8 mg total) by mouth every 8 (eight) hours as needed for nausea or vomiting. 11/02/21   Pleas Koch, NP  pantoprazole (PROTONIX) 40 MG tablet Take 1 tablet (40 mg total) by mouth 2 (two) times daily before a meal. For heartburn 12/21/21   Pleas Koch, NP  pravastatin (PRAVACHOL) 40 MG tablet Take 1 tablet (40 mg total) by mouth daily. for cholesterol. 12/11/21   Pleas Koch, NP  promethazine-dextromethorphan (PROMETHAZINE-DM) 6.25-15 MG/5ML syrup Take 5 mLs by mouth 4 (four) times daily as needed. 02/12/22   Danton Clap, PA-C  venlafaxine (EFFEXOR) 100 MG tablet Take 300 mg by mouth daily.    [provider]  atorvastatin (LIPITOR) 80 MG tablet Take 1 tablet by mouth every evening for cholesterol. 03/26/19 08/06/20  Pleas Koch, NP  diphenhydrAMINE (BENADRYL) 25 mg capsule Take 2 capsules (50 mg total) by mouth every 6 (six) hours as needed. 04/07/20 08/06/20  Carrie Mew, MD  metoCLOPramide (REGLAN) 10 MG tablet Take 1 tablet (10 mg total) by mouth every 6 (six) hours as needed.  04/07/20 08/06/20  Carrie Mew, MD  potassium chloride SA (K-DUR) 20 MEQ tablet Take 1 tablet by mouth once daily 04/12/19 08/06/20  Pleas Koch, NP  QUEtiapine (SEROQUEL XR) 50 MG TB24 24 hr tablet Take 1 tablet (50 mg total) by mouth at bedtime. 07/09/19 08/06/20  Tonia Ghent, MD    Family History Family History  Problem Relation Age of Onset   Asthma Mother    Hyperthyroidism Mother    Rheum arthritis Mother    Graves' disease Mother    Sjogren's syndrome Mother    Migraines Mother    Asthma Daughter    Migraines Daughter    Colon cancer Neg Hx     Social History Social History   Tobacco Use  Smoking status: Former    Packs/day: 0.25    Years: 2.00    Total pack years: 0.50    Types: Cigarettes    Quit date: 2017    Years since quitting: 6.5   Smokeless tobacco: Never  Vaping Use   Vaping Use: Never used  Substance Use Topics   Alcohol use: No   Drug use: No     Allergies   Codeine, Morphine, Neurontin [gabapentin], and Other   Review of Systems Review of Systems  Constitutional: Negative.   Eyes:  Positive for pain, discharge, redness and itching. Negative for photophobia and visual disturbance.  Respiratory: Negative.    Cardiovascular: Negative.   Skin: Negative.   Neurological: Negative.      Physical Exam Triage Vital Signs ED Triage Vitals [04/14/22 1219]  Enc Vitals Group     BP 125/85     Pulse Rate 93     Resp 16     Temp 98.8 F (37.1 C)     Temp Source Oral     SpO2 98 %     Weight      Height      Head Circumference      Peak Flow      Pain Score 3     Pain Loc      Pain Edu?      Excl. in [place]?    No data found.  Updated Vital Signs BP 125/85   Pulse 93   Temp 98.8 F (37.1 C) (Oral)   Resp 16   SpO2 98%   Visual Acuity Right Eye Distance:   Left Eye Distance:   Bilateral Distance:    Right Eye Near:   Left Eye Near:    Bilateral Near:     Physical Exam Constitutional:      Appearance: Normal  appearance.  Eyes:     Comments: Mild erythema to the right conjunctiva, scant clear drainage noted to the lower lash line, no signs of swelling present, vision is grossly intact, extraocular movements intact  Pulmonary:     Effort: Pulmonary effort is normal.  Neurological:     Mental Status: She is alert and oriented to person, place, and time. Mental status is at baseline.      UC Treatments / Results  Labs (all labs ordered are listed, but only abnormal results are displayed) Labs Reviewed - No data to display  EKG   Radiology No results found.  Procedures Procedures (including critical care time)  Medications Ordered in UC Medications - No data to display  Initial Impression / Assessment and Plan / UC Course  I have reviewed the triage vital signs and the nursing notes.  Pertinent labs & imaging results that were available during my care of the patient were reviewed by me and considered in my medical decision making (see chart for details).  Bacterial conjunctivitis of right eye  Moxifloxacin prescribed, discussed administration, recommended oral antihistamines for management of pruritus and over-the-counter analgesics for management of discomfort, may use cool compresses in addition, advised against against any eye touching or rubbing to prevent further contamination and spread, may follow-up with his urgent care as needed if symptoms persist or worsen, given walker referral to ophthalmology as well Final Clinical Impressions(s) / UC Diagnoses   Final diagnoses:  None   Discharge Instructions   None    ED Prescriptions   None    PDMP not reviewed this encounter.   Hans Eden, NP 04/14/22  [place]

## 2022-04-14 NOTE — Discharge Instructions (Addendum)
Today you being treated for bacterial conjunctivitis.   Place one drop of moxifloxacin into the right eye 3 times daily (every 8 hours) for 7 days, may use drops in the left eye if symptoms begin to occur, do not allow dropper to touch the eye   Do not rub eyes, this may cause more irritation.  May use Claritin, Zyrtec or benadryl as needed to help if itching present.  Please avoid use of eye makeup until symptoms clear.  If symptoms persist after use of medication, please follow up at Urgent Care or with ophthalmologist (eye doctor)

## 2022-04-29 ENCOUNTER — Encounter: Payer: Self-pay | Admitting: Primary Care

## 2022-04-29 ENCOUNTER — Ambulatory Visit (INDEPENDENT_AMBULATORY_CARE_PROVIDER_SITE_OTHER): Payer: Commercial Managed Care - HMO | Admitting: Primary Care

## 2022-04-29 VITALS — BP 120/74 | HR 71 | Temp 98.5°F | Ht 65.0 in | Wt 168.0 lb

## 2022-04-29 DIAGNOSIS — R000 Tachycardia, unspecified: Secondary | ICD-10-CM

## 2022-04-29 DIAGNOSIS — E538 Deficiency of other specified B group vitamins: Secondary | ICD-10-CM

## 2022-04-29 DIAGNOSIS — E785 Hyperlipidemia, unspecified: Secondary | ICD-10-CM

## 2022-04-29 DIAGNOSIS — E1165 Type 2 diabetes mellitus with hyperglycemia: Secondary | ICD-10-CM | POA: Diagnosis not present

## 2022-04-29 DIAGNOSIS — I10 Essential (primary) hypertension: Secondary | ICD-10-CM

## 2022-04-29 DIAGNOSIS — Z794 Long term (current) use of insulin: Secondary | ICD-10-CM | POA: Diagnosis not present

## 2022-04-29 LAB — POCT GLYCOSYLATED HEMOGLOBIN (HGB A1C): Hemoglobin A1C: 9.5 % — AB (ref 4.0–5.6)

## 2022-04-29 LAB — LIPID PANEL
Cholesterol: 239 mg/dL — ABNORMAL HIGH (ref 0–200)
HDL: 34.7 mg/dL — ABNORMAL LOW (ref >39.00)
Total CHOL/HDL Ratio: 7
Triglycerides: 473 mg/dL — ABNORMAL HIGH (ref 0.0–149.0)

## 2022-04-29 LAB — VITAMIN B12: Vitamin B-12: 1050 pg/mL — ABNORMAL HIGH (ref 211–911)

## 2022-04-29 LAB — LDL CHOLESTEROL, DIRECT: Direct LDL: 143 mg/dL

## 2022-04-29 MED ORDER — GLIPIZIDE ER 10 MG PO TB24: 10.0000 mg | ORAL_TABLET | Freq: Every day | ORAL | 1 refills | Status: DC

## 2022-04-29 MED ORDER — NOVOLOG FLEXPEN 100 UNIT/ML ~~LOC~~ SOPN: 25.0000 [IU] | PEN_INJECTOR | Freq: Three times a day (TID) | SUBCUTANEOUS | 1 refills | Status: DC

## 2022-04-29 MED ORDER — METOPROLOL TARTRATE 25 MG PO TABS: 25.0000 mg | ORAL_TABLET | Freq: Two times a day (BID) | ORAL | 1 refills | Status: DC

## 2022-04-29 MED ORDER — TRESIBA FLEXTOUCH 200 UNIT/ML ~~LOC~~ SOPN: PEN_INJECTOR | SUBCUTANEOUS | 1 refills | Status: DC

## 2022-04-29 NOTE — Assessment & Plan Note (Signed)
Tolerating pravastatin 40 mg daily. Continue pravastatin 40 mg daily.  Repeat lipid panel pending.

## 2022-04-29 NOTE — Assessment & Plan Note (Addendum)
Uncontrolled with A1C today of 9.5.  We will change to higher concentration of long-acting insulin as she is already injecting large quantities twice daily.  Stop Basaglar insulin. Start Tresiba 200 units/mL, 45 units in the morning and 40 units in the evening. Increase NovoLog to 25 to 30 units 3 times daily with meals. We changed her glipizide to XL 10 mg once daily as she often forgets the second dose. Continue metformin XR 1000 mg in the morning.  Continue to work on diet. Foot exam today.  Follow-up in 6 weeks.

## 2022-04-29 NOTE — Patient Instructions (Addendum)
We changed your insulin regimen from [place] to [place].  This is a higher concentration of insulin.  Inject 45 units in the morning and 40 units in the evening for diabetes.  We increased your NovoLog (mealtime insulin) to 25 to 30 units 3 times daily with meals.  We changed your glipizide to glipizide XL 10 mg once daily. Continue metformin as prescribed.  Continue to watch your blood sugar levels as discussed.  Stop by the lab prior to leaving today. I will notify you of your results once received.   Please schedule a follow up visit for 6 weeks for follow up for diabetes.  It was a pleasure to see you today!

## 2022-04-29 NOTE — Progress Notes (Signed)
Subjective:    Patient ID: Christine Cook, female    DOB: 11-03-1975, 46 y.o.   MRN: [id number]  Diabetes Hypoglycemia symptoms include dizziness. Associated symptoms include polydipsia, polyphagia and polyuria. Pertinent negatives for diabetes include no chest pain.    Christine Cook is a very pleasant 46 y.o. female with a history of hypertension, OSA, chronic pancreatitis, type 2 diabetes, depression, hyperlipidemia who presents today for follow-up of diabetes.  She is due for repeat lipid panel and vitamin B12 check.  She is needing a refill of her metoprolol.  Current medications include: Glipizide 10 mg twice daily, metformin XR 1000 mg daily, Basaglar 40 units in a.m. and 40 units in p.m., NovoLog 20 units 3 times daily with meals.  She misses her second dose of Glipizide often as she forgets.   She is checking her blood glucose 2-3 times daily and is getting readings of:  AM fasting: high 100's Mid day: mid 200's Before dinner: high 200's to mid 300's.  After dinner: mid 200's to 400's.   Last A1C: 10.3 in January 2023, 9.5 today Last Eye Exam: Due Last Foot Exam: Due next month Pneumonia Vaccination: 2019 Urine Microalbumin: UTD Statin: Pravastatin  Dietary changes since last visit: Increased salads, protein shakes, peanut butter, protein, occasional frozen dinner. She began this regimen a few months ago.    Exercise: No regular exercise.   Wt Readings from Last 3 Encounters:  04/29/22 168 lb (76.2 kg)  02/12/22 165 lb (74.8 kg)  12/11/21 164 lb 6.4 oz (74.6 kg)       Review of Systems  Respiratory:  Negative for shortness of breath.   Cardiovascular:  Negative for chest pain.  Endocrine: Positive for polydipsia, polyphagia and polyuria.  Neurological:  Positive for dizziness. Negative for numbness.         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   Sleep apnea with hypersomnolence    CPAP study 11-28-09,  10-02-2009 AHI was 16.5, pressure to    Tachycardia     Social History   Socioeconomic History   Marital status: Single    Spouse name: Not on file   Number of children: 1   Years of education: College   Highest education level: Not on file  Occupational History   Occupation: Support Firefighter: [employer]  Tobacco Use   Smoking status: Former    Packs/day: 0.25    Years: 2.00    Total pack years: 0.50    Types: Cigarettes    Quit date: 2017    Years since quitting: 6.5   Smokeless tobacco: Never  Vaping Use   Vaping Use: Never used  Substance and Sexual Activity   Alcohol use: No   Drug use: No   Sexual activity: Yes    Birth control/protection: I.U.D.  Other Topics Concern   Not on file  Social History Narrative   Single.   One daughter.   Works at [employer].  Enjoys relaxing, spending time with family.   Social Determinants of Health   Financial Resource Strain: Not on file  Food Insecurity: Not on file  Transportation Needs: Not on file  Physical Activity: Not on file  Stress: Not on file  Social Connections: Not on file  Intimate Partner Violence: Not on file    Past Surgical History:  Procedure Laterality Date   ADENOIDECTOMY     BLADDER SURGERY     CHOLECYSTECTOMY     KNEE SURGERY     Left knee x 2    LEEP  10/2016   CIN 1   TONSILLECTOMY      Family History  Problem Relation Age of Onset   Asthma Mother    Hyperthyroidism Mother    Rheum arthritis Mother    Graves' disease Mother    Sjogren's syndrome Mother    Migraines Mother    Asthma Daughter     Migraines Daughter    Colon cancer Neg Hx     Allergies  Allergen Reactions   Codeine Hives and Nausea And Vomiting   Morphine Hives   Neurontin [Gabapentin] Other (See Comments)    syncope   Other Itching    Current Outpatient Medications on File Prior to Visit  Medication Sig Dispense Refill   acetaminophen (TYLENOL) 500 MG tablet Take by mouth.     ARIPiprazole (ABILIFY) 10 MG tablet Take 10 mg by mouth daily.     blood glucose meter kit and supplies KIT Dispense based on patient and insurance preference. Use up to four times daily as directed. (FOR ICD-9 250.00, 250.01). 1 each 0   Cyanocobalamin (B-12 PO) Take 1,000 mcg by mouth daily.     glucose blood (ONETOUCH ULTRA) test strip USE TO CHECK BLOOD SUGAR UP TO FOUR TIMES DAILY AS DIRECTED 400 each 0   Insulin Pen Needle (PEN NEEDLES) 31G X 6 MM MISC Use five times daily with insulin pens. 500 each 3   Lancets (ONETOUCH DELICA PLUS [id number]) MISC SMARTSIG:Topical 1 to 4 Times Daily     Omega-3 Fatty Acids (FISH OIL) 1000 MG CAPS Take 2 capsules (2,000 mg total) by mouth 2 (two) times daily. 360 capsule 0   ondansetron (ZOFRAN ODT) 8 MG disintegrating tablet Take 1 tablet (8 mg total) by mouth every 8 (eight) hours as needed for nausea or vomiting. 20 tablet 0   pantoprazole (PROTONIX) 40 MG tablet Take 1 tablet (40 mg total) by mouth 2 (two) times daily before a meal. For heartburn 180 tablet 0   pravastatin (PRAVACHOL) 40 MG tablet Take 1 tablet (40 mg total) by mouth daily. for cholesterol. 90 tablet 3   venlafaxine (EFFEXOR) 100 MG tablet Take 300 mg by mouth daily.     levonorgestrel (MIRENA) 20 MCG/24HR IUD 1 each by Intrauterine route once.     metFORMIN (GLUCOPHAGE-XR) 500 MG 24 hr tablet Take 2 tablets (1,000 mg total) by mouth daily with breakfast. For diabetes. 180 tablet 1   [DISCONTINUED] atorvastatin (LIPITOR) 80 MG tablet Take 1 tablet by mouth every evening for cholesterol. 90 tablet 2   [DISCONTINUED]  diphenhydrAMINE (BENADRYL) 25 mg capsule Take 2 capsules (50 mg total) by mouth every 6 (six) hours as needed. 60 capsule 0   [DISCONTINUED] metoCLOPramide (REGLAN) 10 MG tablet Take 1 tablet (10 mg total) by mouth every 6 (six) hours as needed. 30 tablet 0   [DISCONTINUED] potassium chloride SA (K-DUR) 20 MEQ tablet Take 1 tablet by mouth once daily 90  tablet 1   [DISCONTINUED] QUEtiapine (SEROQUEL XR) 50 MG TB24 24 hr tablet Take 1 tablet (50 mg total) by mouth at bedtime.     No current facility-administered medications on file prior to visit.    BP 120/74   Pulse 71   Temp 98.5 F (36.9 C) (Oral)   Ht '5\' 5"'  (1.651 m)   Wt 168 lb (76.2 kg)   SpO2 98%   BMI 27.96 kg/m  Objective:   Physical Exam Cardiovascular:     Rate and Rhythm: Normal rate and regular rhythm.  Pulmonary:     Effort: Pulmonary effort is normal.     Breath sounds: Normal breath sounds.  Musculoskeletal:     Cervical back: Neck supple.  Skin:    General: Skin is warm and dry.  Psychiatric:        Mood and Affect: Mood normal.           Assessment & Plan:   Problem List Items Addressed This Visit       Endocrine   Type 2 diabetes mellitus with hyperglycemia, with long-term current use of insulin ([place]) - Primary    Uncontrolled with A1C today of 9.5.  We will change to higher concentration of long-acting insulin as she is already injecting large quantities twice daily.  Stop Basaglar insulin. Start Tresiba 200 units/mL, 45 units in the morning and 40 units in the evening. Increase NovoLog to 25 to 30 units 3 times daily with meals. We changed her glipizide to XL 10 mg once daily as she often forgets the second dose. Continue metformin XR 1000 mg in the morning.  Continue to work on diet. Foot exam today.  Follow-up in 6 weeks.      Relevant Medications   glipiZIDE (GLUCOTROL XL) 10 MG 24 hr tablet   insulin degludec (TRESIBA FLEXTOUCH) 200 UNIT/ML FlexTouch Pen   insulin aspart (NOVOLOG  FLEXPEN) 100 UNIT/ML FlexPen   Other Relevant Orders   POCT glycosylated hemoglobin (Hb A1C) (Completed)     Other   Vitamin B12 deficiency    Continue vitamin B12 1000 mcg daily. Repeat vitamin B12 level pending.      Relevant Orders   Vitamin B12   Hyperlipidemia    Tolerating pravastatin 40 mg daily. Continue pravastatin 40 mg daily.  Repeat lipid panel pending.      Relevant Medications   metoprolol tartrate (LOPRESSOR) 25 MG tablet   Other Relevant Orders   Lipid panel   Tachycardia   Relevant Medications   metoprolol tartrate (LOPRESSOR) 25 MG tablet   Other Visit Diagnoses     Essential hypertension       Relevant Medications   metoprolol tartrate (LOPRESSOR) 25 MG tablet          Pleas Koch, NP

## 2022-04-29 NOTE — Assessment & Plan Note (Signed)
Continue vitamin B12 1000 mcg daily. Repeat vitamin B12 level pending.

## 2022-04-30 DIAGNOSIS — E785 Hyperlipidemia, unspecified: Secondary | ICD-10-CM

## 2022-05-05 MED ORDER — PRAVASTATIN SODIUM 80 MG PO TABS: 80.0000 mg | ORAL_TABLET | Freq: Every day | ORAL | 3 refills | Status: DC

## 2022-05-31 ENCOUNTER — Other Ambulatory Visit: Payer: Self-pay | Admitting: Primary Care

## 2022-05-31 DIAGNOSIS — K219 Gastro-esophageal reflux disease without esophagitis: Secondary | ICD-10-CM

## 2022-06-15 ENCOUNTER — Ambulatory Visit: Payer: Commercial Managed Care - HMO | Admitting: Primary Care

## 2022-06-22 ENCOUNTER — Ambulatory Visit: Payer: Commercial Managed Care - HMO | Admitting: Primary Care

## 2022-06-30 ENCOUNTER — Other Ambulatory Visit: Payer: Self-pay | Admitting: Primary Care

## 2022-06-30 DIAGNOSIS — E119 Type 2 diabetes mellitus without complications: Secondary | ICD-10-CM

## 2022-07-06 DIAGNOSIS — E1165 Type 2 diabetes mellitus with hyperglycemia: Secondary | ICD-10-CM

## 2022-07-07 NOTE — Telephone Encounter (Signed)
Mendel Ryder,  Complex diabetes patient with history of multiple episodes of pancreatitis (not GLP1 candidate). Already on metformin and glipizide. I sent Rx for Tresiba at 200 unit/ml (in late July- see her message) as she is on high doses of Basaglar and Novolog. I have already ruled out type 1 diabetes.   Can you take a look at her Basaglar and Novolog dosing? Can you also see what occurred with her Tyler Aas and provide your opinion on whether this is a better option?   I will see her soon.  Thanks! Allie Bossier, NP-C

## 2022-07-07 NOTE — Telephone Encounter (Signed)
Basaglar dose is somewhat high - 82 units/day =1.07 un/kg, typically basal doses above 0.5-1 un/kg do not help achieve fasting glucose goals. I do not see any utility in reducing her dose right now, as long as she isn't having low sugars. Since she is splitting the dose there should not be any absorption issues, but this may improve some with switching to [place] 200 un/ml - with the concentrated [place] she could even take all ~82 units at once since it would be 1/2 the volume, if that would help with adherence (still waiting on PA for Tresiba, will update soon)  Novolog dose is also reasonable at 75-90 units/day (if she is taking it 3x daily) since it is approximately 1:1 with basal dose.   Without knowing her sugar readings it is difficult to know where to make adjustments - I would guess she is probably having high post-prandial sugars and we may need to increase Novolog doses. CGM would be very helpful here - have you tried that already?

## 2022-07-07 NOTE — Telephone Encounter (Signed)
Submitted PA for Tresiba 200 unit/ml. Will await determination Key: [facility]

## 2022-07-08 NOTE — Telephone Encounter (Signed)
Thanks, Mendel Ryder. I want to say we've been down the [facility] route, can't remember what happened there. I will follow up on that.  Is she on your list of [facility] patients?

## 2022-07-09 MED ORDER — FREESTYLE LIBRE 3 SENSOR MISC: 1 refills | Status: DC

## 2022-07-12 NOTE — Telephone Encounter (Signed)
Forde Radon - I tried to call [facility] to get some more info but the line just kept ringing. I imagine if she asked to cut these Rx's down to 63-monthsupply it would be more reasonable. TTyler Aaslooks like it is the $35/month for insulin that most plans have adopted (I would be surprised if she was paying much less than this for Basaglar?) Freestyle LElenor Legatois capped at the $75/month.

## 2022-07-22 ENCOUNTER — Ambulatory Visit: Payer: Commercial Managed Care - HMO | Admitting: Primary Care

## 2022-08-05 ENCOUNTER — Ambulatory Visit (INDEPENDENT_AMBULATORY_CARE_PROVIDER_SITE_OTHER): Payer: BC Managed Care – PPO | Admitting: Primary Care

## 2022-08-05 ENCOUNTER — Encounter: Payer: Self-pay | Admitting: Primary Care

## 2022-08-05 VITALS — BP 128/80 | HR 80 | Temp 97.3°F | Ht 65.0 in | Wt 175.0 lb

## 2022-08-05 DIAGNOSIS — Z794 Long term (current) use of insulin: Secondary | ICD-10-CM | POA: Diagnosis not present

## 2022-08-05 DIAGNOSIS — E1165 Type 2 diabetes mellitus with hyperglycemia: Secondary | ICD-10-CM | POA: Diagnosis not present

## 2022-08-05 DIAGNOSIS — E1140 Type 2 diabetes mellitus with diabetic neuropathy, unspecified: Secondary | ICD-10-CM

## 2022-08-05 LAB — POCT GLYCOSYLATED HEMOGLOBIN (HGB A1C): Hemoglobin A1C: 8.8 % — AB (ref 4.0–5.6)

## 2022-08-05 NOTE — Assessment & Plan Note (Addendum)
Symptoms suggestive of diabetic neuropathy.  Unremarkable foot exam today.   Recommend using good fitted shoes, insoles. Should start get better once her blood sugars are controlled.   I evaluated patient, was consulted regarding treatment, and agree with assessment and plan per Tinnie Gens, RN, DNP student.   Allie Bossier, NP-C

## 2022-08-05 NOTE — Assessment & Plan Note (Addendum)
Uncontrolled with A1C today of 8.8.   Due to her weight and the amount of insulin she is currently on, will consult with pharmacist before increasing insulin or consider adding SGLT2.   Due to intermittent diarrhea, will not increase metformin at this time.   Continue Tresiba 200 units/ml 45 units in the am and 40 units in the evening; Metformin XR 1000 mg daily and Glipizide 10 mg daily.   Recommended decreasing her soda intake, decreasing carbs and start exercising.   Follow up in three months.  I evaluated patient, was consulted regarding treatment, and agree with assessment and plan per Tinnie Gens, RN, DNP student.   Allie Bossier, NP-C

## 2022-08-05 NOTE — Progress Notes (Signed)
Subjective:    Patient ID: Christine Cook, female    DOB: [birth year], 46 y.o.   MRN: [id number]  HPI  Christine Cook is a very pleasant 46 y.o. female 2 diabetes, hyperlipidemia, hypertension, OSA who presents today for follow-up of diabetes.  Current medications include: Glipizide XL 10 mg daily, metformin XR 1000 mg daily, Tresiba 200 units/mL 45 units in a.m. and 40 units in p.m., NovoLog 25 to 30 units 3 times daily with meals.   She is checking her blood glucose 2-3 times daily and is getting readings in the high 100's in the morning fasting and mid to high 200's in the afternoon.   She began her [place] about 2-3 weeks ago due to insurance issues. She is actually injecting Tresiba 44 units in AM as the pen does not dial to 45 units. She is injecting 40 units in PM.  She has noticed tingling and burning sensation to her bilateral plantar feet. She wears tennis shoes at work, sandals at home.   Last A1C: 9.5 in July 2023, 8.8 today Last Eye Exam: Due Last Foot Exam: Up-to-date Pneumonia Vaccination: 2019 Urine Microalbumin: Up-to-date Statin: Pravastatin  Dietary changes since last visit: Drinking soda during the day. Also not doing great on reducing carbs.   Exercise: No regular exercise.   BP Readings from Last 3 Encounters:  08/05/22 128/80  04/29/22 120/74  04/14/22 125/85      Review of Systems  Respiratory:  Negative for shortness of breath.   Cardiovascular:  Negative for chest pain.  Endocrine: Positive for polydipsia, polyphagia and polyuria.  Neurological:  Positive for numbness.         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   Sleep apnea with hypersomnolence    CPAP study 11-28-09,  10-02-2009 AHI was 16.5, pressure to    Tachycardia     Social History   Socioeconomic History   Marital status: Single    Spouse name: Not on file   Number of children: 1   Years of education: College   Highest education level: Not on file  Occupational History   Occupation: Support Firefighter: [employer]  Tobacco Use   Smoking status: Former    Packs/day: 0.25    Years: 2.00    Total pack years: 0.50    Types: Cigarettes    Quit date: 2017    Years since quitting: 6.8   Smokeless tobacco: Never  Vaping Use   Vaping Use: Never used  Substance and Sexual Activity   Alcohol use: No   Drug use: No   Sexual activity: Yes    Birth control/protection: I.U.D.  Other Topics Concern   Not on file  Social History Narrative   Single.   One daughter.   Works at [employer].   Enjoys relaxing, spending time with family.   Social Determinants of Health   Financial Resource Strain: Not on file  Food Insecurity: Not on file  Transportation  Needs: Not on file  Physical Activity: Not on file  Stress: Not on file  Social Connections: Not on file  Intimate Partner Violence: Not on file    Past Surgical History:  Procedure Laterality Date   ADENOIDECTOMY     BLADDER SURGERY     CHOLECYSTECTOMY     KNEE SURGERY     Left knee x 2    LEEP  10/2016   CIN 1   TONSILLECTOMY      Family History  Problem Relation Age of Onset   Asthma Mother    Hyperthyroidism Mother    Rheum arthritis Mother    Graves' disease Mother    Sjogren's syndrome Mother    Migraines Mother    Asthma Daughter    Migraines Daughter    Colon cancer Neg Hx     Allergies  Allergen Reactions   Codeine Hives and Nausea And Vomiting   Morphine Hives   Neurontin [Gabapentin] Other (See Comments)    syncope   Other Itching    Current Outpatient  Medications on File Prior to Visit  Medication Sig Dispense Refill   acetaminophen (TYLENOL) 500 MG tablet Take by mouth.     ARIPiprazole (ABILIFY) 10 MG tablet Take 10 mg by mouth daily.     blood glucose meter kit and supplies KIT Dispense based on patient and insurance preference. Use up to four times daily as directed. (FOR ICD-9 250.00, 250.01). 1 each 0   glipiZIDE (GLUCOTROL XL) 10 MG 24 hr tablet Take 1 tablet (10 mg total) by mouth daily with breakfast. for diabetes. 90 tablet 1   glucose blood (ONETOUCH ULTRA) test strip USE TO CHECK BLOOD SUGAR UP TO FOUR TIMES DAILY AS DIRECTED 400 each 0   insulin aspart (NOVOLOG FLEXPEN) 100 UNIT/ML FlexPen Inject 25-30 Units into the skin 3 (three) times daily with meals. For diabetes. 45 mL 1   insulin degludec (TRESIBA FLEXTOUCH) 200 UNIT/ML FlexTouch Pen Inject 45 units every morning and 40 units every evening for diabetes. 45 mL 1   Insulin Pen Needle (PEN NEEDLES) 31G X 6 MM MISC Use five times daily with insulin pens. 500 each 3   Lancets (ONETOUCH DELICA PLUS [id number]) MISC SMARTSIG:Topical 1 to 4 Times Daily     levonorgestrel (MIRENA) 20 MCG/24HR IUD 1 each by Intrauterine route once.     metFORMIN (GLUCOPHAGE-XR) 500 MG 24 hr tablet Take 2 tablets (1,000 mg total) by mouth daily with breakfast. For diabetes. 180 tablet 1   metoprolol tartrate (LOPRESSOR) 25 MG tablet Take 1 tablet (25 mg total) by mouth 2 (two) times daily. For heart rate. 180 tablet 1   Omega-3 Fatty Acids (FISH OIL) 1000 MG CAPS Take 2 capsules (2,000 mg total) by mouth 2 (two) times daily. 360 capsule 0   ondansetron (ZOFRAN ODT) 8 MG disintegrating tablet Take 1 tablet (8 mg total) by mouth every 8 (eight) hours as needed for nausea or vomiting. 20 tablet 0   pantoprazole (PROTONIX) 40 MG tablet TAKE 1 TABLET BY MOUTH TWICE DAILY BEFORE A MEAL FOR HEARTBURN 180 tablet 1   pravastatin (PRAVACHOL) 80 MG tablet Take 1 tablet (80 mg total) by mouth daily. for cholesterol.  90 tablet 3   venlafaxine (EFFEXOR) 100 MG tablet Take 300 mg by mouth daily.     Continuous Blood Gluc Sensor (FREESTYLE LIBRE 3 SENSOR) MISC Place 1 sensor on the skin every 14 days. Use to check glucose continuously (Patient not taking: Reported on 08/05/2022)  6 each 1   Cyanocobalamin (B-12 PO) Take 1,000 mcg by mouth daily. (Patient not taking: Reported on 08/05/2022)     [DISCONTINUED] atorvastatin (LIPITOR) 80 MG tablet Take 1 tablet by mouth every evening for cholesterol. 90 tablet 2   [DISCONTINUED] diphenhydrAMINE (BENADRYL) 25 mg capsule Take 2 capsules (50 mg total) by mouth every 6 (six) hours as needed. 60 capsule 0   [DISCONTINUED] metoCLOPramide (REGLAN) 10 MG tablet Take 1 tablet (10 mg total) by mouth every 6 (six) hours as needed. 30 tablet 0   [DISCONTINUED] potassium chloride SA (K-DUR) 20 MEQ tablet Take 1 tablet by mouth once daily 90 tablet 1   [DISCONTINUED] QUEtiapine (SEROQUEL XR) 50 MG TB24 24 hr tablet Take 1 tablet (50 mg total) by mouth at bedtime.     No current facility-administered medications on file prior to visit.    BP 128/80   Pulse 80   Temp (!) 97.3 F (36.3 C) (Temporal)   Ht _0  (1.651 m)   Wt 175 lb (79.4 kg)   SpO2 98%   BMI 29.12 kg/m  Objective:   Physical Exam Cardiovascular:     Rate and Rhythm: Normal rate and regular rhythm.  Pulmonary:     Effort: Pulmonary effort is normal.     Breath sounds: Normal breath sounds. No wheezing or rales.  Musculoskeletal:     Cervical back: Neck supple.  Lymphadenopathy:     Cervical: No cervical adenopathy.  Skin:    General: Skin is warm and dry.  Neurological:     Mental Status: She is alert and oriented to person, place, and time.           Assessment & Plan:   Problem List Items Addressed This Visit       Endocrine   Type 2 diabetes mellitus with hyperglycemia, with long-term current use of insulin ([place]) - Primary    Uncontrolled with A1C today of 8.8.   Due to her weight and  the amount of insulin she is currently on, will consult with pharmacist before increasing insulin or consider adding SGLT2.   Due to intermittent diarrhea, will not increase metformin at this time.   Continue Tresiba 200 units/ml 45 units in the am and 40 units in the evening; Metformin XR 1000 mg daily and Glipizide 10 mg daily.   Recommended decreasing her soda intake, decreasing carbs and start exercising.   Follow up in three months.  I evaluated patient, was consulted regarding treatment, and agree with assessment and plan per Tinnie Gens, RN, DNP student.   Allie Bossier, NP-C       Relevant Orders   POCT glycosylated hemoglobin (Hb A1C) (Completed)   Type 2 diabetes mellitus with diabetic neuropathy (HCC)    Symptoms suggestive of diabetic neuropathy.  Unremarkable foot exam today.   Recommend using good fitted shoes, insoles. Should start get better once her blood sugars are controlled.   I evaluated patient, was consulted regarding treatment, and agree with assessment and plan per Tinnie Gens, RN, DNP student.   Allie Bossier, NP-C         25 min spent with patient, reviewing chart, and consulting with pharmacy regarding treatment regimen.    Pleas Koch, NP

## 2022-08-05 NOTE — Progress Notes (Signed)
Established Patient Office Visit  Subjective   Patient ID: Christine Cook, female    DOB: 1976/04/27  Age: 46 y.o. MRN: [id number]  Chief Complaint  Patient presents with   Follow-up    DM    HPI  Christine Cook is a 46 year old female with past medical history of hypertension, OSA, GERD, chronic pancreatitis, type 2 diabetes, depression, hyperlipidemia, chronic fatigue who presents today for a follow up on diabetes.   Current medications include: Glipizide 10 mg once daily, Tresiba 200 units/ ml, 45 units in the morning and 40 units at night. Metformin XR 1000 mg daily. Novolog 25-30 units with meals.  She just started the [place] 2-3 weeks ago 44 units in the morning and 40 at night.   She is checking her blood glucose couple times daily and is getting readings of 260-280s. Fasting blood sugar readings are 180-190s.  Last A1C: 9.5 in July, 2023 Last Eye Exam: Due. Last Foot Exam: UTD Pneumonia Vaccination: 2019 Urine Microalbumin: UTD Statin: Pravastatin  Dietary changes since last visit: her diet includes baked potato. Still drinks 1-2 sprites daily and still eating carbohydrates.    Exercise: No regular exercise.   She feels that she is hungry all the time, excess thirst and increased urination. She has noticed some tingling, numbness and burning in both of her feet. It is worse in her left foot. She denies any injury or trauma.     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   Back pain 01/02/2018   Hypokalemia 11/29/2017   Nausea 12/09/2016   Cervical dysplasia 12/01/2016   Hyperlipidemia 08/05/2016   Tachycardia 08/05/2016   OSA (obstructive sleep apnea) 07/12/2016   Narcolepsy 07/12/2016   Vitamin B12 deficiency 07/12/2016   Vitamin D deficiency 07/12/2016   Depression 07/11/2016   Impulse control disorder (gambling) 07/11/2016   HTN (hypertension) 07/09/2016   GERD (gastroesophageal reflux disease) 01/21/2009   Past Medical History:  Diagnosis Date   Anxiety    Benzodiazepine overdose    Depression    Diabetes ([place])    Fatty liver disease, nonalcoholic    Heart murmur    Heart palpitations    High cholesterol    Hypersomnia, persistent 02/20/2014   Hypertension    Interstitial cystitis    Irritable bowel syndrome with diarrhea    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   Migraine    Narcolepsy 03/2014   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   Sleep apnea with hypersomnolence    CPAP study 11-28-09,  10-02-2009 AHI was 16.5, pressure to    Tachycardia    Past Surgical History:  Procedure Laterality Date   ADENOIDECTOMY     BLADDER SURGERY     CHOLECYSTECTOMY     KNEE SURGERY     Left knee x 2    LEEP  10/2016   CIN 1   TONSILLECTOMY     Social History   Tobacco Use   Smoking status: Former    Packs/day: 0.25  Years: 2.00    Total pack years: 0.50    Types: Cigarettes    Quit date: 2017    Years since quitting: 6.8   Smokeless tobacco: Never  Vaping Use   Vaping Use: Never used  Substance Use Topics   Alcohol use: No   Drug use: No   Family History  Problem Relation Age of Onset   Asthma Mother    Hyperthyroidism Mother    Rheum arthritis Mother    Graves' disease Mother    Sjogren's syndrome Mother    Migraines Mother    Asthma Daughter    Migraines Daughter    Colon cancer Neg Hx    Allergies  Allergen Reactions   Codeine Hives and Nausea And Vomiting   Morphine Hives   Neurontin  [Gabapentin] Other (See Comments)    syncope   Other Itching      Review of Systems  Eyes:  Negative for blurred vision.  Respiratory:  Negative for shortness of breath.   Cardiovascular:  Negative for chest pain.  Gastrointestinal:  Positive for diarrhea. Negative for nausea.  Genitourinary:  Positive for frequency.  Musculoskeletal:  Negative for joint pain.  Neurological:  Negative for dizziness and tingling.  Endo/Heme/Allergies:  Positive for polydipsia.      Objective:     BP 128/80   Pulse 80   Temp (!) 97.3 F (36.3 C) (Temporal)   Ht '5\' 5"'$  (1.651 m)   Wt 175 lb (79.4 kg)   SpO2 98%   BMI 29.12 kg/m  BP Readings from Last 3 Encounters:  08/05/22 128/80  04/29/22 120/74  04/14/22 125/85   Wt Readings from Last 3 Encounters:  08/05/22 175 lb (79.4 kg)  04/29/22 168 lb (76.2 kg)  02/12/22 165 lb (74.8 kg)      Physical Exam Vitals reviewed.  Cardiovascular:     Rate and Rhythm: Normal rate and regular rhythm.     Heart sounds: Normal heart sounds.  Pulmonary:     Effort: Pulmonary effort is normal.     Breath sounds: Normal breath sounds.  Neurological:     General: No focal deficit present.     Mental Status: She is oriented to person, place, and time.     Sensory: No sensory deficit.     Motor: No weakness.  Psychiatric:        Mood and Affect: Mood normal.        Behavior: Behavior normal.      Results for orders placed or performed in visit on 08/05/22  POCT glycosylated hemoglobin (Hb A1C)  Result Value Ref Range   Hemoglobin A1C 8.8 (A) 4.0 - 5.6 %   HbA1c POC (<> result, manual entry)     HbA1c, POC (prediabetic range)     HbA1c, POC (controlled diabetic range)      Last hemoglobin A1c Lab Results  Component Value Date   HGBA1C 8.8 (A) 08/05/2022      The 10-year ASCVD risk score (Arnett DK, et al., 2019) is: 6.3%    Assessment & Plan:   Problem List Items Addressed This Visit       Endocrine   Type 2 diabetes mellitus  with hyperglycemia, with long-term current use of insulin ([place]) - Primary    Uncontrolled with A1C today of 8.8.   Due to her weight and the amount of insulin she is currently on, will consult with pharmacist before increasing insulin or consider adding SGLT2.   Due to intermittent diarrhea,  will not increase metformin at this time.   Continue Tresiba 200 units/ml 45 units in the am and 40 units in the evening; Metformin XR 1000 mg daily and Glipizide 10 mg daily.   Recommended decreasing her soda intake, decreasing carbs and start exercising.   Follow up in three months.      Relevant Orders   POCT glycosylated hemoglobin (Hb A1C) (Completed)   Type 2 diabetes mellitus with diabetic neuropathy ([place])    Suspect this is due to elevated blood sugars.  Unremarkable foot exam today.   Recommend using good fitted shoes, insoles. Should start get better once her blood sugars are controlled.          No follow-ups on file.    Tinnie Gens, BSN-RN, DNP STUDENT

## 2022-08-05 NOTE — Patient Instructions (Signed)
We will be in touch with you.

## 2022-08-12 DIAGNOSIS — F331 Major depressive disorder, recurrent, moderate: Secondary | ICD-10-CM

## 2022-08-12 DIAGNOSIS — F411 Generalized anxiety disorder: Secondary | ICD-10-CM

## 2022-08-13 MED ORDER — ARIPIPRAZOLE 5 MG PO TABS: 5.0000 mg | ORAL_TABLET | Freq: Every day | ORAL | 0 refills | Status: DC

## 2022-08-13 MED ORDER — VENLAFAXINE HCL ER 150 MG PO CP24: 300.0000 mg | ORAL_CAPSULE | Freq: Every day | ORAL | 0 refills | Status: AC

## 2022-08-16 ENCOUNTER — Encounter: Payer: Self-pay | Admitting: *Deleted

## 2022-10-04 ENCOUNTER — Other Ambulatory Visit: Payer: Self-pay | Admitting: Primary Care

## 2022-10-04 DIAGNOSIS — E1165 Type 2 diabetes mellitus with hyperglycemia: Secondary | ICD-10-CM

## 2022-10-05 NOTE — Telephone Encounter (Signed)
Rx sent this morning.

## 2022-10-05 NOTE — Telephone Encounter (Signed)
Received notification from [place] for clarification on

## 2022-10-08 ENCOUNTER — Telehealth: Payer: Self-pay | Admitting: Primary Care

## 2022-10-08 DIAGNOSIS — E1165 Type 2 diabetes mellitus with hyperglycemia: Secondary | ICD-10-CM

## 2022-10-08 MED ORDER — NOVOLOG FLEXPEN RELION 100 UNIT/ML ~~LOC~~ SOPN: PEN_INJECTOR | SUBCUTANEOUS | 0 refills | Status: DC

## 2022-10-08 NOTE — Telephone Encounter (Signed)
Please have patient call her insurance company to find out which version they will cover.

## 2022-10-08 NOTE — Telephone Encounter (Signed)
Noted, prescription for brand NovoLog sent to pharmacy.

## 2022-10-08 NOTE — Telephone Encounter (Signed)
Called patient and she stated she needs the brand name (Novolog flex pen) sent in to the pharmacy so her insurance will cover it.

## 2022-10-08 NOTE — Telephone Encounter (Signed)
Patient called in stating that otc version of insulin aspart (NOVOLOG FLEXPEN) 100 UNIT/ML FlexPen was called in and her insurance will not cover it.

## 2022-10-25 ENCOUNTER — Telehealth: Payer: Self-pay

## 2022-10-25 NOTE — Telephone Encounter (Signed)
Transition Care Management Unsuccessful Follow-up Telephone Call  Date of discharge and from where:  [facility] 1.19.24  Attempts:  1st Attempt  Reason for unsuccessful TCM follow-up call:  Left voice message

## 2022-10-26 NOTE — Telephone Encounter (Signed)
Transition Care Management Unsuccessful Follow-up Telephone Call  Date of discharge and from where:  [facility] 1.19.24  Attempts:  2nd Attempt  Reason for unsuccessful TCM follow-up call:  Left voice message

## 2022-10-27 NOTE — Telephone Encounter (Signed)
Transition Care Management Unsuccessful Follow-up Telephone Call  Date of discharge and from where:  [facility] 1.19.24  Attempts:  3rd Attempt  Reason for unsuccessful TCM follow-up call:  Left voice message

## 2022-11-04 ENCOUNTER — Telehealth: Payer: Self-pay

## 2022-11-04 NOTE — Telephone Encounter (Signed)
Transition Care Management Unsuccessful Follow-up Telephone Call  Date of discharge and from where:  [facility] 11-03-22 Dx: RUQ pain   Attempts:  1st Attempt  Reason for unsuccessful TCM follow-up call:  Left voice message   Juanda Crumble LPN [place] Direct Dial [phone]

## 2022-11-04 NOTE — Telephone Encounter (Signed)
Transition Care Management Unsuccessful Follow-up Telephone Call  Date of discharge and from where:   [facility] 11-03-22 Dx: RUQ pain    Attempts:  2nd Attempt  Reason for unsuccessful TCM follow-up call:  Left voice message Pt has fu appt with PCP 11-05-22 at [place] Direct Dial [phone]

## 2022-11-05 ENCOUNTER — Ambulatory Visit: Payer: No Typology Code available for payment source | Admitting: Primary Care

## 2022-11-05 ENCOUNTER — Encounter: Payer: Self-pay | Admitting: *Deleted

## 2022-11-05 ENCOUNTER — Encounter: Payer: Self-pay | Admitting: Primary Care

## 2022-11-05 VITALS — BP 118/76 | HR 85 | Temp 97.3°F | Ht 65.0 in | Wt 171.0 lb

## 2022-11-05 DIAGNOSIS — F331 Major depressive disorder, recurrent, moderate: Secondary | ICD-10-CM

## 2022-11-05 DIAGNOSIS — Z794 Long term (current) use of insulin: Secondary | ICD-10-CM

## 2022-11-05 DIAGNOSIS — F411 Generalized anxiety disorder: Secondary | ICD-10-CM | POA: Diagnosis not present

## 2022-11-05 DIAGNOSIS — E1165 Type 2 diabetes mellitus with hyperglycemia: Secondary | ICD-10-CM

## 2022-11-05 DIAGNOSIS — R110 Nausea: Secondary | ICD-10-CM | POA: Diagnosis not present

## 2022-11-05 DIAGNOSIS — K861 Other chronic pancreatitis: Secondary | ICD-10-CM

## 2022-11-05 DIAGNOSIS — G4733 Obstructive sleep apnea (adult) (pediatric): Secondary | ICD-10-CM

## 2022-11-05 DIAGNOSIS — R0683 Snoring: Secondary | ICD-10-CM

## 2022-11-05 LAB — POCT GLYCOSYLATED HEMOGLOBIN (HGB A1C): Hemoglobin A1C: 9.5 % — AB (ref 4.0–5.6)

## 2022-11-05 MED ORDER — METFORMIN HCL ER 500 MG PO TB24: 500.0000 mg | ORAL_TABLET | Freq: Every day | ORAL | 1 refills | Status: DC

## 2022-11-05 MED ORDER — ARIPIPRAZOLE 5 MG PO TABS: 5.0000 mg | ORAL_TABLET | Freq: Every day | ORAL | 0 refills | Status: DC

## 2022-11-05 MED ORDER — FREESTYLE LIBRE 3 SENSOR MISC: 1 refills | Status: DC

## 2022-11-05 MED ORDER — ONDANSETRON 8 MG PO TBDP: 8.0000 mg | ORAL_TABLET | Freq: Three times a day (TID) | ORAL | 0 refills | Status: DC | PRN

## 2022-11-05 NOTE — Assessment & Plan Note (Signed)
Recurrent flare with recent hospital admission and ED visit.  Reviewed ED notes/labs/imaging from January 2024.  Follow-up with GI as scheduled. Refills provided for Zofran to use as needed.

## 2022-11-05 NOTE — Assessment & Plan Note (Signed)
Scheduled with psychiatry later this month.  Refill provided for Abilify 5 mg daily.

## 2022-11-05 NOTE — Assessment & Plan Note (Signed)
Uncontrolled with A1c of 9.5 today.  Given her chronic hyperglycemia, coupled with recurrent pancreatitis, we will refer to endocrinology for further treatment.  Will also refer her to diabetes nutrition management.  Resume Tresiba at 45 units in a.m. and 40 units in p.m.  Discussed that she could inject all of this at once given her Tresiba dose of 200 units/ml. Continue metformin XR 500 mg daily.  She cannot tolerate higher doses due to diarrhea. Resume glipizide XL 10 mg daily.  Not a candidate for GLP-1 agonist given recurrent pancreatitis.  I have asked her to send glucose readings in 2 weeks via MyChart.

## 2022-11-05 NOTE — Assessment & Plan Note (Signed)
No CPAP machine.  Referral placed again to pulmonology for evaluation.

## 2022-11-05 NOTE — Addendum Note (Signed)
Addended by: Ellamae Sia on: 11/05/2022 11:46 AM   Modules accepted: Orders

## 2022-11-05 NOTE — Progress Notes (Signed)
Subjective:    Patient ID: Christine Cook, female    DOB: July 10, 1976, 47 y.o.   MRN: [id number]  HPI  Christine Cook is a very pleasant 47 y.o. female with a history of type 2 diabetes, OSA, hypertension, chronic pancreatitis, chronic fatigue, depression who presents today for follow up of diabetes and  1) Type 2 Diabetes:  Current medications include: metformin XR 1000 mg daily, glipizide XL 10 mg daily, Tresiba 200 unit/ml 45 units in AM and 40 units in PM, Novolog inuslin 25-30 units TID with meals.   She is only taking 500 mg of metformin due to diarrhea. She is actually injecting 30 units BID of Tresiba. She is actually injecting 20 units TID of Novolog. Her Glipizide was discontinued during her last hospital stay.  She is checking her blood glucose 2-3 times daily and is getting readings of:  AM fasting: low to mid 200's.  Afternoon: high 300's. Sometimes 400's  Last A1C: 8.8 in November 2023, 9.5 today Last Eye Exam: Due Last Foot Exam: UTD Pneumonia Vaccination: 2019 Urine Microalbumin: Due Statin: pravastatin   Dietary changes since last visit: Decreased appetite since recent pancreatitis.  Working to hydrate well.   Exercise: None.   2) Abdominal Pain: Evaluated at [facility] ED on 11/03/22 for epigastric abdominal pain in the setting of recent gallstone pancreatitis. During her stay in the ED lipase was 76, improved from prior lipase a few days ago which was in the 1000's. CBC without leukocytosis. She underwent CT abdomen/pelvis and RUQ ultrasound which was without acute process. Pancreatic stent in place. She was discharged home later that day.   Since her ED visit she's feeling somewhat better. She continues to experience epigastric pain, working to keep hydrated with water and protein shakes. She's eating very low fat and bland.   She is schedule in April with GI for removal of the stone and replacement of the stent.  She is requesting a refill of her Zofran.  3)  Snoring: Chronic for years. During her hospitalization she was noted to have desaturations while sleeping.  She lives with her parents who report chronic snoring.  She was referred to pulmonary services by me in March 2023 for further evaluation of sleep apnea as her prior CPAP machine broke years ago.  She did not follow through with the referral at that time.  Review of Systems  Constitutional:  Positive for appetite change.  Respiratory:  Negative for shortness of breath.   Cardiovascular:  Negative for chest pain.  Gastrointestinal:  Positive for abdominal pain and nausea.  Neurological:  Positive for numbness.         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   Sleep apnea with hypersomnolence    CPAP study 11-28-09,  10-02-2009 AHI was 16.5, pressure to    Tachycardia     Social History   Socioeconomic History   Marital status: Single    Spouse name: Not on file   Number of children: 1  Years of education: College   Highest education level: Not on file  Occupational History   Occupation: Support specialist    Employer: [employer]  Tobacco Use   Smoking status: Former    Packs/day: 0.25    Years: 2.00    Total pack years: 0.50    Types: Cigarettes    Quit date: 2017    Years since quitting: 7.0   Smokeless tobacco: Never  Vaping Use   Vaping Use: Never used  Substance and Sexual Activity   Alcohol use: No   Drug use: No   Sexual activity: Yes    Birth control/protection: I.U.D.  Other Topics Concern   Not on file  Social History Narrative   Single.   One  daughter.   Works at [employer].   Enjoys relaxing, spending time with family.   Social Determinants of Health   Financial Resource Strain: Not on file  Food Insecurity: Not on file  Transportation Needs: Not on file  Physical Activity: Not on file  Stress: Not on file  Social Connections: Not on file  Intimate Partner Violence: Not on file    Past Surgical History:  Procedure Laterality Date   ADENOIDECTOMY     BLADDER SURGERY     CHOLECYSTECTOMY     KNEE SURGERY     Left knee x 2    LEEP  10/2016   CIN 1   TONSILLECTOMY      Family History  Problem Relation Age of Onset   Asthma Mother    Hyperthyroidism Mother    Rheum arthritis Mother    Graves' disease Mother    Sjogren's syndrome Mother    Migraines Mother    Asthma Daughter    Migraines Daughter    Colon cancer Neg Hx     Allergies  Allergen Reactions   Codeine Hives and Nausea And Vomiting   Morphine Hives   Neurontin [Gabapentin] Other (See Comments)    syncope   Other Itching    Current Outpatient Medications on File Prior to Visit  Medication Sig Dispense Refill   acetaminophen (TYLENOL) 500 MG tablet Take by mouth.     blood glucose meter kit and supplies KIT Dispense based on patient and insurance preference. Use up to four times daily as directed. (FOR ICD-9 250.00, 250.01). 1 each 0   glipiZIDE (GLUCOTROL XL) 10 MG 24 hr tablet Take 1 tablet (10 mg total) by mouth daily with breakfast. for diabetes. 90 tablet 1   glucose blood (ONETOUCH ULTRA) test strip USE TO CHECK BLOOD SUGAR UP TO FOUR TIMES DAILY AS DIRECTED 400 each 0   insulin degludec (TRESIBA FLEXTOUCH) 200 UNIT/ML FlexTouch Pen Inject 45 units every morning and 40 units every evening for diabetes. 45 mL 1   Insulin Pen Needle (PEN NEEDLES) 31G X 6 MM MISC Use five times daily with insulin pens. 500 each 3   Lancets (ONETOUCH DELICA PLUS [id number]) MISC SMARTSIG:Topical 1 to 4 Times Daily     levonorgestrel (MIRENA) 20 MCG/24HR IUD 1 each  by Intrauterine route once.     metoprolol tartrate (LOPRESSOR) 25 MG tablet Take 1 tablet (25 mg total) by mouth 2 (two) times daily. For heart rate. 180 tablet 1   NOVOLOG FLEXPEN 100 UNIT/ML FlexPen Inject 25-30 units three times daily with meals for diabetes. 45 mL 0   pantoprazole (PROTONIX) 40 MG tablet TAKE 1 TABLET BY MOUTH TWICE DAILY BEFORE A MEAL FOR HEARTBURN 180 tablet 1   pravastatin (  PRAVACHOL) 80 MG tablet Take 1 tablet (80 mg total) by mouth daily. for cholesterol. 90 tablet 3   venlafaxine XR (EFFEXOR-XR) 150 MG 24 hr capsule Take 2 capsules (300 mg total) by mouth daily with breakfast. 180 capsule 0   Cyanocobalamin (B-12 PO) Take 1,000 mcg by mouth daily. (Patient not taking: Reported on 08/05/2022)     Omega-3 Fatty Acids (FISH OIL) 1000 MG CAPS Take 2 capsules (2,000 mg total) by mouth 2 (two) times daily. (Patient not taking: Reported on 11/05/2022) 360 capsule 0   [DISCONTINUED] atorvastatin (LIPITOR) 80 MG tablet Take 1 tablet by mouth every evening for cholesterol. 90 tablet 2   [DISCONTINUED] diphenhydrAMINE (BENADRYL) 25 mg capsule Take 2 capsules (50 mg total) by mouth every 6 (six) hours as needed. 60 capsule 0   [DISCONTINUED] metoCLOPramide (REGLAN) 10 MG tablet Take 1 tablet (10 mg total) by mouth every 6 (six) hours as needed. 30 tablet 0   [DISCONTINUED] potassium chloride SA (K-DUR) 20 MEQ tablet Take 1 tablet by mouth once daily 90 tablet 1   [DISCONTINUED] QUEtiapine (SEROQUEL XR) 50 MG TB24 24 hr tablet Take 1 tablet (50 mg total) by mouth at bedtime.     No current facility-administered medications on file prior to visit.    BP 118/76   Pulse 85   Temp (!) 97.3 F (36.3 C) (Temporal)   Ht '5\' 5"'$  (1.651 m)   Wt 171 lb (77.6 kg)   SpO2 99%   BMI 28.46 kg/m  Objective:   Physical Exam Constitutional:      General: She is not in acute distress. Cardiovascular:     Rate and Rhythm: Normal rate and regular rhythm.  Pulmonary:     Effort: Pulmonary effort  is normal.     Breath sounds: Normal breath sounds.  Abdominal:     Tenderness: There is abdominal tenderness in the right upper quadrant, epigastric area, periumbilical area and left upper quadrant. There is no guarding.  Musculoskeletal:     Cervical back: Neck supple.  Skin:    General: Skin is warm and dry.           Assessment & Plan:  Type 2 diabetes mellitus with hyperglycemia, with long-term current use of insulin (HCC) Assessment & Plan: Uncontrolled with A1c of 9.5 today.  Given her chronic hyperglycemia, coupled with recurrent pancreatitis, we will refer to endocrinology for further treatment.  Will also refer her to diabetes nutrition management.  Resume Tresiba at 45 units in a.m. and 40 units in p.m.  Discussed that she could inject all of this at once given her Tresiba dose of 200 units/ml. Continue metformin XR 500 mg daily.  She cannot tolerate higher doses due to diarrhea. Resume glipizide XL 10 mg daily.  Not a candidate for GLP-1 agonist given recurrent pancreatitis.  I have asked her to send glucose readings in 2 weeks via MyChart.  Orders: -     POCT glycosylated hemoglobin (Hb A1C) -     Microalbumin / creatinine urine ratio -     Ambulatory referral to Endocrinology -     Referral to Nutrition and Diabetes Services -     metFORMIN HCl ER; Take 1 tablet (500 mg total) by mouth daily with breakfast. For diabetes.  Dispense: 90 tablet; Refill: 1 -     FreeStyle Libre 3 Sensor; Place 1 sensor on the skin every 14 days. Use to check glucose continuously  Dispense: 6 each; Refill: 1  Nausea -  Ondansetron; Take 1 tablet (8 mg total) by mouth every 8 (eight) hours as needed for nausea or vomiting.  Dispense: 20 tablet; Refill: 0  GAD (generalized anxiety disorder) Assessment & Plan: Scheduled with psychiatry later this month.  Refill provided for Abilify 5 mg daily.  Orders: -     ARIPiprazole; Take 1 tablet (5 mg total) by mouth daily.  Dispense:  90 tablet; Refill: 0  Moderate episode of recurrent major depressive disorder (HCC) -     ARIPiprazole; Take 1 tablet (5 mg total) by mouth daily.  Dispense: 90 tablet; Refill: 0  Snoring -     Ambulatory referral to Pulmonology  OSA (obstructive sleep apnea) Assessment & Plan: No CPAP machine.  Referral placed again to pulmonology for evaluation.   Chronic pancreatitis, unspecified pancreatitis type [facility]) Assessment & Plan: Recurrent flare with recent hospital admission and ED visit.  Reviewed ED notes/labs/imaging from January 2024.  Follow-up with GI as scheduled. Refills provided for Zofran to use as needed.         Pleas Koch, NP

## 2022-11-05 NOTE — Patient Instructions (Signed)
Increase your Tyler Aas back up to 45 units in the morning and 40 units in the evening.  Increased your NovoLog insulin to 30 units 3 times daily with meals.  Resume your glipizide XL 10 mg daily.  You will either be contacted via phone regarding your referral to endocrinology, pulmonology, and for diabetes nutrition management, or you may receive a letter on your MyChart portal from our referral team with instructions for scheduling an appointment. Please let us know if you have not been contacted by anyone within two weeks.  Blood sugar readings via MyChart in 2 weeks.  It was a pleasure to see you today!

## 2022-11-09 NOTE — Telephone Encounter (Signed)
Noted  Referral updated

## 2022-11-21 ENCOUNTER — Other Ambulatory Visit: Payer: Self-pay | Admitting: Primary Care

## 2022-11-21 DIAGNOSIS — E1165 Type 2 diabetes mellitus with hyperglycemia: Secondary | ICD-10-CM

## 2022-11-23 ENCOUNTER — Ambulatory Visit: Payer: No Typology Code available for payment source | Admitting: *Deleted

## 2022-11-23 DIAGNOSIS — E1165 Type 2 diabetes mellitus with hyperglycemia: Secondary | ICD-10-CM

## 2022-11-23 DIAGNOSIS — Z794 Long term (current) use of insulin: Secondary | ICD-10-CM

## 2022-11-23 MED ORDER — DAPAGLIFLOZIN PROPANEDIOL 5 MG PO TABS: 5.0000 mg | ORAL_TABLET | Freq: Every day | ORAL | 0 refills | Status: DC

## 2022-12-03 ENCOUNTER — Institutional Professional Consult (permissible substitution): Payer: No Typology Code available for payment source | Admitting: Primary Care

## 2022-12-06 MED ORDER — EMPAGLIFLOZIN 10 MG PO TABS: 10.0000 mg | ORAL_TABLET | Freq: Every day | ORAL | 0 refills | Status: DC

## 2022-12-06 NOTE — Telephone Encounter (Signed)
Submitted PA for Farxiga '5mg'$  tablets. Awaiting determination.   Key: [id number]

## 2022-12-06 NOTE — Telephone Encounter (Signed)
Kelli, can you check on the PA for this? It really needs to be done soon.

## 2022-12-06 NOTE — Telephone Encounter (Signed)
PA for Farxiga '5mg'$  tab has been denied. See below for denial reasons  The policy states that this medication may be approved when: -The member has a clinical condition or needs a specific dosage form for which there is no alternative on the formulary OR -The listed formulary alternatives are not recommended based on published guidelines or clinical literature OR -The formulary alternatives will likely be ineffective or less effective for the member OR -The formulary alternatives will likely cause an adverse effect OR -The member is unable to take the required number of formulary alternatives for the given diagnosis due to a trial and inadequate treatment response or contraindication OR -The member has tried and failed the required number of formulary alternatives. Based on the policy and the information we have, your request is denied. We did not receive any documentation that you meet any of the criteria outlined above. Formulary alternative(s) are Jardiance. Requirement: 3 in a class with 3 or more alternatives, 2 in a class with 2 alternatives, or 1 in a class with only 1 alternative. Please refer to your plan documents for a complete list of alternatives.

## 2022-12-07 NOTE — Telephone Encounter (Signed)
Received request for PA for Jardiance.  Submitted PA key: [id number].  Awaiting determination.

## 2022-12-08 NOTE — Telephone Encounter (Signed)
Received approval of PA for Jardiance '10mg'$  tablets.   Effective dates: 12/07/22- 12/07/2023

## 2022-12-15 MED ORDER — ONETOUCH ULTRA VI STRP: ORAL_STRIP | 0 refills | Status: AC

## 2022-12-19 NOTE — Telephone Encounter (Signed)
Mendel Ryder,  Have you ever encountered the scenario below? Do you know of patient assistance for glucose strips? She has uncontrolled diabetes and is on insulin. They don't cover CGM either.   Allie Bossier, NP-C

## 2022-12-20 ENCOUNTER — Other Ambulatory Visit (HOSPITAL_COMMUNITY): Payer: Self-pay

## 2022-12-20 NOTE — Telephone Encounter (Signed)
Christine Cook, can you be watching for this fax from [facility] for the Dexcom meter and glucometer?

## 2022-12-20 NOTE — Telephone Encounter (Signed)
I called Christine Cook and spoke with the pharmacist, he requested that a generic prescription for "glucometer, glucometer strips, and lancets" be sent it and they will sub whatever insurance covers for the pt.

## 2022-12-20 NOTE — Telephone Encounter (Signed)
Please see below - can send generic order for pharmacist to substitute based on coverage.

## 2022-12-20 NOTE — Telephone Encounter (Signed)
This does seem excessive - I'm sure there is a brand of test strips that is preferred. Routing to the Prior auth team to see if they can do a test claim and give [place] some more information.  RX Prior auth team: please process a test claim for the One touch ultra test strips - is there a rejection that tells [place] which brand may be preferred?

## 2022-12-23 NOTE — Telephone Encounter (Signed)
Received request for Dexcom meter and glucometer. Form is in your box

## 2022-12-23 NOTE — Telephone Encounter (Signed)
Completed and placed in Kelli's inbox. 

## 2023-01-20 ENCOUNTER — Encounter: Payer: Self-pay | Admitting: Primary Care

## 2023-01-20 ENCOUNTER — Institutional Professional Consult (permissible substitution): Payer: No Typology Code available for payment source | Admitting: Internal Medicine

## 2023-01-26 ENCOUNTER — Other Ambulatory Visit: Payer: Self-pay | Admitting: Primary Care

## 2023-01-26 DIAGNOSIS — E1165 Type 2 diabetes mellitus with hyperglycemia: Secondary | ICD-10-CM

## 2023-02-02 ENCOUNTER — Other Ambulatory Visit: Payer: Self-pay | Admitting: Primary Care

## 2023-02-02 DIAGNOSIS — E1165 Type 2 diabetes mellitus with hyperglycemia: Secondary | ICD-10-CM

## 2023-02-24 ENCOUNTER — Institutional Professional Consult (permissible substitution): Payer: No Typology Code available for payment source | Admitting: Adult Health

## 2023-03-04 ENCOUNTER — Telehealth: Payer: Self-pay

## 2023-03-04 NOTE — Telephone Encounter (Signed)
Pt. Called back she is not on cpap and he's had a sleep study over 10 years ago

## 2023-03-04 NOTE — Telephone Encounter (Signed)
Patient has an appointment on Monday. I called patient to see if she is currently using a CPAP machine. LVM for the patient to return my call.

## 2023-03-04 NOTE — Telephone Encounter (Signed)
Noted.  Will close encounter.  

## 2023-03-07 ENCOUNTER — Encounter: Payer: Self-pay | Admitting: Primary Care

## 2023-03-07 ENCOUNTER — Ambulatory Visit: Payer: No Typology Code available for payment source | Admitting: Primary Care

## 2023-03-07 VITALS — BP 120/80 | HR 79 | Temp 98.2°F | Ht 65.0 in | Wt 177.0 lb

## 2023-03-07 DIAGNOSIS — G4733 Obstructive sleep apnea (adult) (pediatric): Secondary | ICD-10-CM

## 2023-03-07 NOTE — Progress Notes (Signed)
@Patient  ID: Christine Cook, female    DOB: 1976/05/21, 48 y.o.   MRN: [id number]  Chief Complaint  Patient presents with   Consult    Sleep study over 10 years ago. In the hospital in Jan and was told she had Sleep Apnea and needed to be set back up with a CPAP. Loud snoring and excessive tiredness.    Referring provider: Doreene Nest, NP  HPI: 47 year old female, former smoker quit in 2017.  Past medical history significant for hypertension, OSA, GERD, chronic pancreatitis, type 2 diabetes, chronic fatigue, narcolepsy, vitamin B12 deficiency, vitamin D deficiency.  03/07/2023 Patient presents today for sleep consult. She was admitted in January at [facility] for right upper quadrant pain, patient underwent ERCP on 10/19/2022 for chronic pancreatitis. She was placed on CPAP while in the hospital and told to return to sleep provider. She has hx sleep apnea. PSG 10/02/2009 showed moderate OSA, AHI 16.5/hour. She had split night sleep study on 04/02/2014 with overall AHI 38.5/hour, CPAP @ 10cm h20 was effective. She did not do well initially on CPAP, she had a difficult times tolerating. She no longer uses or has CPAP machine. She saw no clinical benefit from use and took CPAP mask off in her sleep often.  She is not likely candidate for oral appliance. She is interested in inspire device or re-trial cpap. She denies sudden sleep attacks, feels past narcolepsy symtpoms were stress/job related.   Sleep questionnaire Symptoms-, disrupted sleep and daytime sleepiness Previous sleep study-10/02/2009>> moderate OSA; 04/02/2014>> severe OSA Bedtime-9 to 10 PM Time to fall asleep-15 minutes Nocturnal awakenings-twice Start of day-8:30 AM Do you operate heavy machinery- no Weight changes-up 30 pounds Do currently wear CPAP- no Do you wear oxygen- no Epworth score-7   Allergies  Allergen Reactions   Codeine Hives and Nausea And Vomiting   Morphine Hives   Neurontin [Gabapentin] Other (See  Comments)    syncope   Other Itching    Immunization History  Administered Date(s) Administered   Influenza,inj,Quad PF,6+ Mos 07/12/2016, 07/13/2017, 08/17/2018, 11/02/2021   Influenza-Unspecified 07/14/2017, 07/23/2020   PFIZER(Purple Top)SARS-COV-2 Vaccination 02/02/2020, 02/26/2020, 09/17/2020   Pneumococcal Polysaccharide-23 04/13/2018   Tdap 01/02/2018    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     Asenapine Maleate 10 MG SUBL Place 1 tablet under the tongue daily.  BD PEN NEEDLE MICRO U/F 32G X 6 MM MISC USE AS DIRECTED 5 TIMES DAILY WITH  INSULIN  PENS 400 each 0   blood glucose meter kit and supplies KIT Dispense based on patient and insurance preference. Use up to four times daily as directed. (FOR ICD-9 250.00, 250.01). 1 each 0   citalopram (CELEXA) 40 MG tablet Take 40 mg by mouth daily.     Continuous Blood Gluc Sensor (FREESTYLE LIBRE 3 SENSOR) MISC Place 1 sensor on the skin every 14 days. Use to check glucose continuously  6 each 1   diphenhydramine-acetaminophen (TYLENOL PM) 25-500 MG TABS tablet Take 2 tablets by mouth at bedtime.     empagliflozin (JARDIANCE) 10 MG TABS tablet Take 1 tablet (10 mg total) by mouth daily before breakfast. for diabetes. 90 tablet 0   insulin degludec (TRESIBA FLEXTOUCH) 200 UNIT/ML FlexTouch Pen Inject 45 units every morning and 40 units every evening for diabetes. 45 mL 1   levonorgestrel (MIRENA) 20 MCG/24HR IUD 1 each by Intrauterine route once.     metFORMIN (GLUCOPHAGE-XR) 500 MG 24 hr tablet Take 1 tablet (500 mg total) by mouth daily with breakfast. For diabetes. 90 tablet 1   metoprolol tartrate (LOPRESSOR) 25 MG tablet Take 1 tablet (25 mg total) by mouth 2 (two) times daily. For heart rate. 180 tablet 1   NOVOLOG FLEXPEN 100 UNIT/ML FlexPen Inject 25-30 units three times daily with meals for diabetes. 45 mL 0   ondansetron (ZOFRAN ODT) 8 MG disintegrating tablet Take 1 tablet (8 mg total) by mouth every 8 (eight) hours as needed for nausea or vomiting. 20 tablet 0   pantoprazole (PROTONIX) 40 MG tablet TAKE 1 TABLET BY MOUTH TWICE DAILY BEFORE A MEAL FOR HEARTBURN 180 tablet 1   venlafaxine XR (EFFEXOR-XR) 150 MG 24 hr capsule Take 2 capsules (300 mg total) by mouth daily with breakfast. 180 capsule 0   ARIPiprazole (ABILIFY) 5 MG tablet Take 1 tablet (5 mg total) by mouth daily. (Patient not taking: Reported on 03/07/2023) 90 tablet 0   Cyanocobalamin (B-12 PO) Take 1,000 mcg by mouth daily. (Patient not taking: Reported on 08/05/2022)     glipiZIDE (GLUCOTROL XL) 10 MG 24 hr tablet TAKE 1 TABLET BY MOUTH DAILY WITH BREAKFAST FOR DIABETES (Patient not taking: Reported on 03/07/2023) 90 tablet 0   glucose blood (ONETOUCH ULTRA) test strip USE TO CHECK BLOOD SUGAR UP TO FOUR TIMES DAILY AS DIRECTED (Patient not taking: Reported on 03/07/2023) 400 each 0   Lancets (ONETOUCH DELICA PLUS LANCET33G) MISC SMARTSIG:Topical 1 to 4 Times Daily (Patient not taking: Reported on 03/07/2023)      Omega-3 Fatty Acids (FISH OIL) 1000 MG CAPS Take 2 capsules (2,000 mg total) by mouth 2 (two) times daily. (Patient not taking: Reported on 11/05/2022) 360 capsule 0   pravastatin (PRAVACHOL) 80 MG tablet Take 1 tablet (80 mg total) by mouth daily. for cholesterol. (Patient not taking: Reported on 03/07/2023) 90 tablet 3   No facility-administered medications prior to visit.   Review of Systems  Review of Systems  Constitutional:  Positive for fatigue.  HENT: Negative.    Respiratory: Negative.    Psychiatric/Behavioral:  Positive for sleep disturbance.     Physical Exam  BP 120/80 (BP Location: Left Arm, Cuff Size: Normal)   Pulse 79   Temp 98.2 F (36.8 C)   Ht 5\' 5"  (1.651 m)   Wt 177 lb (80.3 kg)   SpO2 97%   BMI 29.45  kg/m  Physical Exam Constitutional:      General: She is not in acute distress.    Appearance: Normal appearance. She is not ill-appearing.  HENT:     Head: Normocephalic and atraumatic.     Mouth/Throat:     Mouth: Mucous membranes are moist.     Pharynx: Oropharynx is clear.  Cardiovascular:     Rate and Rhythm: Normal rate and regular rhythm.  Pulmonary:     Effort: Pulmonary effort is normal.     Breath sounds: Normal breath sounds.  Neurological:     General: No focal deficit present.     Mental Status: She is alert and oriented to person, place, and time. Mental status is at baseline.  Psychiatric:        Mood and Affect: Mood normal.        Behavior: Behavior normal.        Thought Content: Thought content normal.        Judgment: Judgment normal.      Lab Results:  CBC    Component Value Date/Time   WBC 9.5 12/11/2021 0910   RBC 5.35 (H) 12/11/2021 0910   HGB 12.5 12/11/2021 0910   HGB 12.3 02/09/2017 1016   HCT 39.5 12/11/2021 0910   HCT 37.5 02/09/2017 1016   PLT 331.0 12/11/2021 0910   PLT 285 11/25/2016 1856   MCV 73.7 (L) 12/11/2021 0910   MCV 82 02/09/2017 1016   MCH 26.9 12/23/2020 1445   MCHC 31.7 12/11/2021 0910   RDW  16.7 (H) 12/11/2021 0910   RDW 15.4 02/09/2017 1016   LYMPHSABS 1.9 12/23/2020 1445   LYMPHSABS 2.0 02/09/2017 1016   MONOABS 0.7 12/23/2020 1445   EOSABS 0.4 12/23/2020 1445   EOSABS 0.3 02/09/2017 1016   BASOSABS 0.0 12/23/2020 1445   BASOSABS 0.0 02/09/2017 1016    BMET    Component Value Date/Time   NA 139 12/11/2021 0910   NA 143 05/11/2017 0940   K 3.9 12/11/2021 0910   CL 99 12/11/2021 0910   CO2 29 12/11/2021 0910   GLUCOSE 232 (H) 12/11/2021 0910   BUN 9 12/11/2021 0910   BUN 6 05/11/2017 0940   CREATININE 0.77 12/11/2021 0910   CALCIUM 9.6 12/11/2021 0910   GFRNONAA >60 12/23/2020 1445   GFRAA >60 05/27/2020 1503    BNP No results found for: "BNP"  ProBNP No results found for: "PROBNP"  Imaging: No results found.   Assessment & Plan:   OSA (obstructive sleep apnea) - Severe OSA; 10/02/2009>> moderate OSA; 04/02/2014>> severe OSA with overall AHI 38.5/hour, CPAP @ 10cm h20 was effective  - Reports difficulty tolerating CPAP, not currently using - Continues to have snoring symptoms, disrupted sleep and daytime sleepiness - Needs home sleep study to re-evaluate OSA  - Reviewed risks of untreated sleep apnea including cardiac arrhythmias, pulmonary hypertension, diabetes and stroke.  Discussed alternative treatment options, patient not likely or a candidate due to dentures.  She is interested in inspire device or retrying CPAP - Encourage weight loss and side sleeping position.  Advised against driving if experiencing excessive daytime sleepiness fatigue.  Patient does not drink alcohol.  Follow-up 1 to 2 weeks after sleep study review results and treatment options.  Likely will recommend referral to ENT for inspire consideration   Glenford Bayley, NP 03/07/2023

## 2023-03-07 NOTE — Progress Notes (Signed)
Reviewed and agree with assessment/plan.   Coralyn Helling, MD [facility] Pulmonary/Critical Care 03/07/2023, 12:48 PM Pager:  [phone]

## 2023-03-07 NOTE — Patient Instructions (Addendum)
Orders: Home sleep study  Follow-up Call or send mychart message 1-2 weeks after sleep study for results, our plan would be to refer you to ENT for Inspire consideration or re-try CPAP if needed first   Sleep Apnea Sleep apnea is a condition in which breathing pauses or becomes shallow during sleep. People with sleep apnea usually snore loudly. They may have times when they gasp and stop breathing for 10 seconds or more during sleep. This may happen many times during the night. Sleep apnea disrupts your sleep and keeps your body from getting the rest that it needs. This condition can increase your risk of certain health problems, including: Heart attack. Stroke. Obesity. Type 2 diabetes. Heart failure. Irregular heartbeat. High blood pressure. The goal of treatment is to help you breathe normally again. What are the causes?  The most common cause of sleep apnea is a collapsed or blocked airway. There are three kinds of sleep apnea: Obstructive sleep apnea. This kind is caused by a blocked or collapsed airway. Central sleep apnea. This kind happens when the part of the brain that controls breathing does not send the correct signals to the muscles that control breathing. Mixed sleep apnea. This is a combination of obstructive and central sleep apnea. What increases the risk? You are more likely to develop this condition if you: Are overweight. Smoke. Have a smaller than normal airway. Are older. Are female. Drink alcohol. Take sedatives or tranquilizers. Have a family history of sleep apnea. Have a tongue or tonsils that are larger than normal. What are the signs or symptoms? Symptoms of this condition include: Trouble staying asleep. Loud snoring. Morning headaches. Waking up gasping. Dry mouth or sore throat in the morning. Daytime sleepiness and tiredness. If you have daytime fatigue because of sleep apnea, you may be more likely to have: Trouble  concentrating. Forgetfulness. Irritability or mood swings. Personality changes. Feelings of depression. Sexual dysfunction. This may include loss of interest if you are female, or erectile dysfunction if you are female. How is this diagnosed? This condition may be diagnosed with: A medical history. A physical exam. A series of tests that are done while you are sleeping (sleep study). These tests are usually done in a sleep lab, but they may also be done at home. How is this treated? Treatment for this condition aims to restore normal breathing and to ease symptoms during sleep. It may involve managing health issues that can affect breathing, such as high blood pressure or obesity. Treatment may include: Sleeping on your side. Using a decongestant if you have nasal congestion. Avoiding the use of depressants, including alcohol, sedatives, and narcotics. Losing weight if you are overweight. Making changes to your diet. Quitting smoking. Using a device to open your airway while you sleep, such as: An oral appliance. This is a custom-made mouthpiece that shifts your lower jaw forward. A continuous positive airway pressure (CPAP) device. This device blows air through a mask when you breathe out (exhale). A nasal expiratory positive airway pressure (EPAP) device. This device has valves that you put into each nostril. A bi-level positive airway pressure (BIPAP) device. This device blows air through a mask when you breathe in (inhale) and breathe out (exhale). Having surgery if other treatments do not work. During surgery, excess tissue is removed to create a wider airway. Follow these instructions at home: Lifestyle Make any lifestyle changes that your health care provider recommends. Eat a healthy, well-balanced diet. Take steps to lose weight if you  are overweight. Avoid using depressants, including alcohol, sedatives, and narcotics. Do not use any products that contain nicotine or tobacco.  These products include cigarettes, chewing tobacco, and vaping devices, such as e-cigarettes. If you need help quitting, ask your health care provider. General instructions Take over-the-counter and prescription medicines only as told by your health care provider. If you were given a device to open your airway while you sleep, use it only as told by your health care provider. If you are having surgery, make sure to tell your health care provider you have sleep apnea. You may need to bring your device with you. Keep all follow-up visits. This is important. Contact a health care provider if: The device that you received to open your airway during sleep is uncomfortable or does not seem to be working. Your symptoms do not improve. Your symptoms get worse. Get help right away if: You develop: Chest pain. Shortness of breath. Discomfort in your back, arms, or stomach. You have: Trouble speaking. Weakness on one side of your body. Drooping in your face. These symptoms may represent a serious problem that is an emergency. Do not wait to see if the symptoms will go away. Get medical help right away. Call your local emergency services (911 in the [place] Do not drive yourself to the hospital. Summary Sleep apnea is a condition in which breathing pauses or becomes shallow during sleep. The most common cause is a collapsed or blocked airway. The goal of treatment is to restore normal breathing and to ease symptoms during sleep. This information is not intended to replace advice given to you by your health care provider. Make sure you discuss any questions you have with your health care provider. Document Revised: 04/29/2021 Document Reviewed: 08/29/2020 Elsevier Patient Education  2024 [employer].

## 2023-03-07 NOTE — Assessment & Plan Note (Addendum)
-   Severe OSA; 10/02/2009>> moderate OSA; 04/02/2014>> severe OSA with overall AHI 38.5/hour, CPAP @ 10cm h20 was effective  - Reports difficulty tolerating CPAP, not currently using - Continues to have snoring symptoms, disrupted sleep and daytime sleepiness - Needs home sleep study to re-evaluate OSA  - Reviewed risks of untreated sleep apnea including cardiac arrhythmias, pulmonary hypertension, diabetes and stroke.  Discussed alternative treatment options, patient not likely or a candidate due to dentures.  She is interested in inspire device or retrying CPAP - Encourage weight loss and side sleeping position.  Advised against driving if experiencing excessive daytime sleepiness fatigue.  Patient does not drink alcohol.  Follow-up 1 to 2 weeks after sleep study review results and treatment options.  Likely will recommend referral to ENT for inspire consideration

## 2023-03-08 ENCOUNTER — Other Ambulatory Visit: Payer: Self-pay | Admitting: Primary Care

## 2023-03-08 DIAGNOSIS — K219 Gastro-esophageal reflux disease without esophagitis: Secondary | ICD-10-CM

## 2023-03-14 DIAGNOSIS — G4733 Obstructive sleep apnea (adult) (pediatric): Secondary | ICD-10-CM

## 2023-04-05 ENCOUNTER — Ambulatory Visit (INDEPENDENT_AMBULATORY_CARE_PROVIDER_SITE_OTHER): Payer: No Typology Code available for payment source | Admitting: Primary Care

## 2023-04-05 ENCOUNTER — Encounter: Payer: Self-pay | Admitting: Primary Care

## 2023-04-05 VITALS — BP 118/78 | HR 80 | Temp 97.3°F | Ht 65.0 in | Wt 174.0 lb

## 2023-04-05 DIAGNOSIS — G4733 Obstructive sleep apnea (adult) (pediatric): Secondary | ICD-10-CM

## 2023-04-05 DIAGNOSIS — E785 Hyperlipidemia, unspecified: Secondary | ICD-10-CM

## 2023-04-05 DIAGNOSIS — E1165 Type 2 diabetes mellitus with hyperglycemia: Secondary | ICD-10-CM

## 2023-04-05 DIAGNOSIS — F411 Generalized anxiety disorder: Secondary | ICD-10-CM

## 2023-04-05 DIAGNOSIS — R197 Diarrhea, unspecified: Secondary | ICD-10-CM | POA: Diagnosis not present

## 2023-04-05 DIAGNOSIS — K219 Gastro-esophageal reflux disease without esophagitis: Secondary | ICD-10-CM

## 2023-04-05 DIAGNOSIS — Z1231 Encounter for screening mammogram for malignant neoplasm of breast: Secondary | ICD-10-CM

## 2023-04-05 DIAGNOSIS — Z0001 Encounter for general adult medical examination with abnormal findings: Secondary | ICD-10-CM | POA: Diagnosis not present

## 2023-04-05 DIAGNOSIS — Z794 Long term (current) use of insulin: Secondary | ICD-10-CM

## 2023-04-05 DIAGNOSIS — K861 Other chronic pancreatitis: Secondary | ICD-10-CM | POA: Diagnosis not present

## 2023-04-05 DIAGNOSIS — F331 Major depressive disorder, recurrent, moderate: Secondary | ICD-10-CM

## 2023-04-05 DIAGNOSIS — I10 Essential (primary) hypertension: Secondary | ICD-10-CM | POA: Diagnosis not present

## 2023-04-05 DIAGNOSIS — R000 Tachycardia, unspecified: Secondary | ICD-10-CM

## 2023-04-05 LAB — CBC WITH DIFFERENTIAL/PLATELET
Basophils Absolute: 0.1 10*3/uL (ref 0.0–0.1)
Basophils Relative: 0.7 % (ref 0.0–3.0)
Eosinophils Absolute: 0.3 10*3/uL (ref 0.0–0.7)
Eosinophils Relative: 2.6 % (ref 0.0–5.0)
HCT: 42.7 % (ref 36.0–46.0)
Hemoglobin: 13.6 g/dL (ref 12.0–15.0)
Lymphocytes Relative: 19.4 % (ref 12.0–46.0)
Lymphs Abs: 2 10*3/uL (ref 0.7–4.0)
MCHC: 31.8 g/dL (ref 30.0–36.0)
MCV: 79.1 fl (ref 78.0–100.0)
Monocytes Absolute: 0.7 10*3/uL (ref 0.1–1.0)
Monocytes Relative: 6.5 % (ref 3.0–12.0)
Neutro Abs: 7.2 10*3/uL (ref 1.4–7.7)
Neutrophils Relative %: 70.8 % (ref 43.0–77.0)
Platelets: 347 10*3/uL (ref 150.0–400.0)
RBC: 5.4 Mil/uL — ABNORMAL HIGH (ref 3.87–5.11)
RDW: 18.2 % — ABNORMAL HIGH (ref 11.5–15.5)
WBC: 10.2 10*3/uL (ref 4.0–10.5)

## 2023-04-05 LAB — LDL CHOLESTEROL, DIRECT: Direct LDL: 178 mg/dL

## 2023-04-05 LAB — BASIC METABOLIC PANEL
BUN: 12 mg/dL (ref 6–23)
CO2: 27 mEq/L (ref 19–32)
Calcium: 9.9 mg/dL (ref 8.4–10.5)
Chloride: 103 mEq/L (ref 96–112)
Creatinine, Ser: 0.81 mg/dL (ref 0.40–1.20)
GFR: 86.19 mL/min (ref >60.00)
Glucose, Bld: 129 mg/dL — ABNORMAL HIGH (ref 70–99)
Potassium: 3.6 mEq/L (ref 3.5–5.1)
Sodium: 141 mEq/L (ref 135–145)

## 2023-04-05 LAB — LIPID PANEL
Cholesterol: 267 mg/dL — ABNORMAL HIGH (ref 0–200)
HDL: 36.5 mg/dL — ABNORMAL LOW (ref >39.00)
Total CHOL/HDL Ratio: 7
Triglycerides: 541 mg/dL — ABNORMAL HIGH (ref 0.0–149.0)

## 2023-04-05 LAB — LIPASE: Lipase: 19 U/L (ref 11.0–59.0)

## 2023-04-05 NOTE — Assessment & Plan Note (Signed)
Controlled.  Continue metoprolol tartrate 25 BID.

## 2023-04-05 NOTE — Assessment & Plan Note (Signed)
Following with GI.   ERCP due in October 2024.

## 2023-04-05 NOTE — Assessment & Plan Note (Signed)
Controlled. ? ?Continue metoprolol tartrate 25 mg twice daily. ?

## 2023-04-05 NOTE — Assessment & Plan Note (Signed)
Myalgias with pravastatin.  Has failed other statin therapy.  Given her history of uncontrolled diabetes, coupled with her family history, she is at high risk for CAD. We discussed to try taking pravastatin 3 times weekly.  She will do so and update.

## 2023-04-05 NOTE — Patient Instructions (Signed)
Stop by the lab prior to leaving today. I will notify you of your results once received.   Return the stool studies as soon as possible.  Try taking the pravastatin cholesterol medication 3 days weekly.  [facility].   It was a pleasure to see you today!

## 2023-04-05 NOTE — Assessment & Plan Note (Signed)
Following with pulmonology. Is waiting on results from recent at home sleep study.

## 2023-04-05 NOTE — Addendum Note (Signed)
Addended by: Alvina Chou on: 04/05/2023 11:25 AM   Modules accepted: Orders

## 2023-04-05 NOTE — Assessment & Plan Note (Signed)
Following with psychiatry.  Continue asenapine 10 mg daily, Celexa 40 mg daily, venlafaxine ER 300 mg daily. 

## 2023-04-05 NOTE — Assessment & Plan Note (Signed)
Overall controlled. Following with GI.  Continue pantoprazole 40 mg twice daily.

## 2023-04-05 NOTE — Assessment & Plan Note (Signed)
Following with psychiatry.  Continue asenapine 10 mg daily, Celexa 40 mg daily, venlafaxine ER 300 mg daily.

## 2023-04-05 NOTE — Assessment & Plan Note (Signed)
Following with endocrinology. Reviewed A1c from April 2024 through care everywhere.  Continue metformin XR 500 mg daily, Tresiba 45 units in a.m. and 40 units in the evening, NovoLog sliding scale.

## 2023-04-05 NOTE — Assessment & Plan Note (Signed)
Immunizations UTD. Pap smear UTD, follows with GYN Mammogram due, orders placed. Colonoscopy due, she declines today.  Discussed the importance of a healthy diet and regular exercise in order for weight loss, and to reduce the risk of further co-morbidity.  Exam stable. Labs pending.  Follow up in 1 year for repeat physical.

## 2023-04-05 NOTE — Progress Notes (Signed)
Subjective:    Patient ID: Christine Cook, female    DOB: October 04, 1976, 47 y.o.   MRN: [id number]  HPI  Christine Cook is a very pleasant 47 y.o. female who presents today for complete physical and follow up of chronic conditions.  She would also like to discuss acute diarrhea. Symptom onset four days ago with recurrent episodes of watery, liquid diarrhea, occurring 7-8 times daily. So far today, she's had 3 episodes of watery diarrhea. Also with generalized mid abdominal pain that feels different than her typical pancreatitis pain. She denies blood stools, recent antibiotic use, vomiting. Symptoms mostly occur within about 30 minutes after eating. She took one dose of Imodium four days ago which helped temporarily.  Her mother had the same symptoms, but her symptoms resolved. They both had [place] prior to symptom onset. She is working to hydrate some with water.   Immunizations: -Tetanus: Completed in 2019 -Pneumonia: Completed 2019  Diet: Fair diet.  Exercise: No regular exercise.  Eye exam: Completed 2 years ago. Dental exam: Completed several years ago. Has dentures.  Pap Smear: Follows with GYN. Mammogram: Completed years ago, declines.  Colonoscopy: Never completed, declined last year, declines today.   BP Readings from Last 3 Encounters:  04/05/23 118/78  03/07/23 120/80  11/05/22 118/76      Review of Systems  Constitutional:  Negative for unexpected weight change.  HENT:  Negative for rhinorrhea.   Respiratory:  Negative for cough and shortness of breath.   Cardiovascular:  Negative for chest pain.  Gastrointestinal:  Positive for abdominal pain and diarrhea. Negative for blood in stool, constipation and vomiting.  Genitourinary:  Negative for difficulty urinating.  Musculoskeletal:  Negative for arthralgias and myalgias.  Skin:  Negative for rash.  Allergic/Immunologic: Negative for environmental allergies.  Neurological:  Negative for dizziness,  numbness and headaches.         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   Sleep apnea with hypersomnolence    CPAP study 11-28-09,  10-02-2009 AHI was 16.5, pressure to    Tachycardia     Social History   Socioeconomic History   Marital status: Single    Spouse name: Not on file   Number of children: 1   Years of education: College   Highest education level: Not on file  Occupational History   Occupation: Support Chief Executive Officer: [employer]  Tobacco Use   Smoking status: Former    Packs/day: 0.25    Years: 2.00    Additional pack years: 0.00    Total pack years: 0.50    Types: Cigarettes    Quit date: 2017    Years since quitting: 7.5   Smokeless tobacco: Never  Vaping Use   Vaping Use: Never used  Substance and Sexual Activity   Alcohol use: No   Drug use: No   Sexual activity: Yes    Birth control/protection: I.U.D.  Other Topics Concern   Not on file  Social  History Narrative   Single.   One daughter.   Works at [employer].   Enjoys relaxing, spending time with family.   Social Determinants of Health   Financial Resource Strain: Not on file  Food Insecurity: Not on file  Transportation Needs: Not on file  Physical Activity: Not on file  Stress: Not on file  Social Connections: Not on file  Intimate Partner Violence: Not on file    Past Surgical History:  Procedure Laterality Date   ADENOIDECTOMY     BLADDER SURGERY     CHOLECYSTECTOMY     KNEE SURGERY     Left knee x 2    LEEP  10/2016   CIN 1   TONSILLECTOMY      Family History   Problem Relation Age of Onset   Asthma Mother    Hyperthyroidism Mother    Rheum arthritis Mother    Graves' disease Mother    Sjogren's syndrome Mother    Migraines Mother    Asthma Daughter    Migraines Daughter    Colon cancer Neg Hx     Allergies  Allergen Reactions   Codeine Hives and Nausea And Vomiting   Morphine Hives   Neurontin [Gabapentin] Other (See Comments)    syncope   Other Itching    Current Outpatient Medications on File Prior to Visit  Medication Sig Dispense Refill   Asenapine Maleate 10 MG SUBL Place 1 tablet under the tongue daily.     BD PEN NEEDLE MICRO U/F 32G X 6 MM MISC USE AS DIRECTED 5 TIMES DAILY WITH  INSULIN  PENS 400 each 0   blood glucose meter kit and supplies KIT Dispense based on patient and insurance preference. Use up to four times daily as directed. (FOR ICD-9 250.00, 250.01). 1 each 0   citalopram (CELEXA) 40 MG tablet Take 40 mg by mouth daily.     Continuous Blood Gluc Sensor (FREESTYLE LIBRE 3 SENSOR) MISC Place 1 sensor on the skin every 14 days. Use to check glucose continuously 6 each 1   Cyanocobalamin (B-12 PO) Take 1,000 mcg by mouth daily.     diphenhydramine-acetaminophen (TYLENOL PM) 25-500 MG TABS tablet Take 2 tablets by mouth at bedtime.     empagliflozin (JARDIANCE) 10 MG TABS tablet Take 1 tablet (10 mg total) by mouth daily before breakfast. for diabetes. 90 tablet 0   glucose blood (ONETOUCH ULTRA) test strip USE TO CHECK BLOOD SUGAR UP TO FOUR TIMES DAILY AS DIRECTED 400 each 0   insulin degludec (TRESIBA FLEXTOUCH) 200 UNIT/ML FlexTouch Pen Inject 45 units every morning and 40 units every evening for diabetes. 45 mL 1   Lancets (ONETOUCH DELICA PLUS LANCET33G) MISC      levonorgestrel (MIRENA) 20 MCG/24HR IUD 1 each by Intrauterine route once.     metFORMIN (GLUCOPHAGE-XR) 500 MG 24 hr tablet Take 1 tablet (500 mg total) by mouth daily with breakfast. For diabetes. 90 tablet 1   NOVOLOG FLEXPEN 100 UNIT/ML FlexPen  Inject 25-30 units three times daily with meals for diabetes. 45 mL 0   ondansetron (ZOFRAN ODT) 8 MG disintegrating tablet Take 1 tablet (8 mg total) by mouth every 8 (eight) hours as needed for nausea or vomiting. 20 tablet 0   pantoprazole (PROTONIX) 40 MG tablet TAKE 1 TABLET BY MOUTH TWICE DAILY BEFORE A MEAL FOR HEARTBURN 180 tablet 0   venlafaxine XR (EFFEXOR-XR) 150 MG 24 hr capsule Take 2 capsules (300 mg total) by mouth daily with  breakfast. 180 capsule 0   acetaminophen (TYLENOL) 500 MG tablet Take 500 mg by mouth as needed. (Patient not taking: Reported on 04/05/2023)     metoprolol tartrate (LOPRESSOR) 25 MG tablet Take 1 tablet (25 mg total) by mouth 2 (two) times daily. For heart rate. (Patient not taking: Reported on 04/05/2023) 180 tablet 1   Omega-3 Fatty Acids (FISH OIL) 1000 MG CAPS Take 2 capsules (2,000 mg total) by mouth 2 (two) times daily. (Patient not taking: Reported on 11/05/2022) 360 capsule 0   pravastatin (PRAVACHOL) 80 MG tablet Take 1 tablet (80 mg total) by mouth daily. for cholesterol. (Patient not taking: Reported on 03/07/2023) 90 tablet 3   [DISCONTINUED] atorvastatin (LIPITOR) 80 MG tablet Take 1 tablet by mouth every evening for cholesterol. 90 tablet 2   [DISCONTINUED] diphenhydrAMINE (BENADRYL) 25 mg capsule Take 2 capsules (50 mg total) by mouth every 6 (six) hours as needed. 60 capsule 0   [DISCONTINUED] metoCLOPramide (REGLAN) 10 MG tablet Take 1 tablet (10 mg total) by mouth every 6 (six) hours as needed. 30 tablet 0   [DISCONTINUED] potassium chloride SA (K-DUR) 20 MEQ tablet Take 1 tablet by mouth once daily 90 tablet 1   [DISCONTINUED] QUEtiapine (SEROQUEL XR) 50 MG TB24 24 hr tablet Take 1 tablet (50 mg total) by mouth at bedtime.     No current facility-administered medications on file prior to visit.    BP 118/78   Pulse 80   Temp (!) 97.3 F (36.3 C) (Temporal)   Ht 5\' 5"  (1.651 m)   Wt 174 lb (78.9 kg)   SpO2 98%   BMI 28.96 kg/m  Objective:    Physical Exam HENT:     Right Ear: Tympanic membrane and ear canal normal.     Left Ear: Tympanic membrane and ear canal normal.     Nose: Nose normal.  Eyes:     Conjunctiva/sclera: Conjunctivae normal.     Pupils: Pupils are equal, round, and reactive to light.  Neck:     Thyroid: No thyromegaly.  Cardiovascular:     Rate and Rhythm: Normal rate and regular rhythm.     Heart sounds: No murmur heard. Pulmonary:     Effort: Pulmonary effort is normal.     Breath sounds: Normal breath sounds. No rales.  Abdominal:     General: Bowel sounds are normal.     Palpations: Abdomen is soft.     Tenderness: There is abdominal tenderness in the periumbilical area.  Musculoskeletal:        General: Normal range of motion.     Cervical back: Neck supple.  Lymphadenopathy:     Cervical: No cervical adenopathy.  Skin:    General: Skin is warm and dry.     Findings: No rash.  Neurological:     Mental Status: She is alert and oriented to person, place, and time.     Cranial Nerves: No cranial nerve deficit.     Deep Tendon Reflexes: Reflexes are normal and symmetric.           Assessment & Plan:  Encounter for annual general medical examination with abnormal findings in adult Assessment & Plan: Immunizations UTD. Pap smear UTD, follows with GYN Mammogram due, orders placed. Colonoscopy due, she declines today.  Discussed the importance of a healthy diet and regular exercise in order for weight loss, and to reduce the risk of further co-morbidity.  Exam stable. Labs pending.  Follow up in 1 year for repeat  physical.    Screening mammogram for breast cancer -     3D Screening Mammogram, Left and Right; Future  Primary hypertension Assessment & Plan: Controlled.  Continue metoprolol tartrate 25 BID.   OSA (obstructive sleep apnea) Assessment & Plan: Following with pulmonology. Is waiting on results from recent at home sleep study.   Chronic pancreatitis,  unspecified pancreatitis type [facility]) Assessment & Plan: Following with GI.   ERCP due in October 2024.   Gastroesophageal reflux disease without esophagitis Assessment & Plan: Overall controlled. Following with GI.  Continue pantoprazole 40 mg twice daily.   Type 2 diabetes mellitus with hyperglycemia, with long-term current use of insulin [facility]) Assessment & Plan: Following with endocrinology. Reviewed A1c from April 2024 through care everywhere.  Continue metformin XR 500 mg daily, Tresiba 45 units in a.m. and 40 units in the evening, NovoLog sliding scale.   Moderate episode of recurrent major depressive disorder [facility]) Assessment & Plan: Following with psychiatry.  Continue asenapine 10 mg daily, Celexa 40 mg daily, venlafaxine ER 300 mg daily.   GAD (generalized anxiety disorder) Assessment & Plan: Following with psychiatry.  Continue asenapine 10 mg daily, Celexa 40 mg daily, venlafaxine ER 300 mg daily.   Hyperlipidemia, unspecified hyperlipidemia type Assessment & Plan: Myalgias with pravastatin.  Has failed other statin therapy.  Given her history of uncontrolled diabetes, coupled with her family history, she is at high risk for CAD. We discussed to try taking pravastatin 3 times weekly.  She will do so and update.  Orders: -     Lipid panel  Tachycardia Assessment & Plan: Controlled.  Continue metoprolol tartrate 25 mg twice daily.   Acute diarrhea Assessment & Plan: Presumed infectious given her ongoing symptoms.  Checking CBC with differential, BMP, lipase today. Checking stool studies for which she will return.  Discussed the importance of proper hydration with water and electrolytes. Await results.  Orders: -     Giardia antigen -     Gastrointestinal Pathogen Pnl RT, PCR -     C. difficile GDH and Toxin A/B -     CBC with Differential/Platelet -     Lipase -     Basic metabolic panel        Doreene Nest, NP

## 2023-04-05 NOTE — Assessment & Plan Note (Signed)
Presumed infectious given her ongoing symptoms.  Checking CBC with differential, BMP, lipase today. Checking stool studies for which she will return.  Discussed the importance of proper hydration with water and electrolytes. Await results.

## 2023-04-06 LAB — C. DIFFICILE GDH AND TOXIN A/B
GDH ANTIGEN: DETECTED
SPECIMEN QUALITY:: ADEQUATE

## 2023-04-06 LAB — TIQ-NTM

## 2023-04-06 MED ORDER — FISH OIL 1000 MG PO CAPS
1000.0000 mg | ORAL_CAPSULE | Freq: Two times a day (BID) | ORAL | 3 refills | Status: DC
Start: 2023-04-06 — End: 2023-11-04

## 2023-04-06 NOTE — Telephone Encounter (Signed)
Results are not in. Can you see if we have received them yet? Thank You!

## 2023-04-07 LAB — GASTROINTESTINAL PATHOGEN PNL
CampyloBacter Group: NOT DETECTED
Shiga Toxin 1: NOT DETECTED
Vibrio Group: NOT DETECTED
Yersinia enterocolitica: NOT DETECTED

## 2023-04-07 LAB — C. DIFFICILE GDH AND TOXIN A/B
MICRO NUMBER:: 15153109
TOXIN A AND B: NOT DETECTED

## 2023-04-07 LAB — CLOSTRIDIUM DIFFICILE TOXIN B, QUALITATIVE, REAL-TIME PCR: Toxigenic C. Difficile by PCR: NOT DETECTED

## 2023-04-11 ENCOUNTER — Other Ambulatory Visit: Payer: Self-pay | Admitting: Primary Care

## 2023-04-11 DIAGNOSIS — Z794 Long term (current) use of insulin: Secondary | ICD-10-CM

## 2023-04-12 ENCOUNTER — Ambulatory Visit
Admission: EM | Admit: 2023-04-12 | Discharge: 2023-04-12 | Disposition: A | Discharge status: Home / Self Care | Payer: No Typology Code available for payment source | Attending: Emergency Medicine | Admitting: Emergency Medicine

## 2023-04-12 ENCOUNTER — Ambulatory Visit (INDEPENDENT_AMBULATORY_CARE_PROVIDER_SITE_OTHER): Payer: No Typology Code available for payment source | Admitting: Primary Care

## 2023-04-12 DIAGNOSIS — T07XXXA Unspecified multiple injuries, initial encounter: Secondary | ICD-10-CM

## 2023-04-12 DIAGNOSIS — Z23 Encounter for immunization: Secondary | ICD-10-CM

## 2023-04-12 DIAGNOSIS — G4733 Obstructive sleep apnea (adult) (pediatric): Secondary | ICD-10-CM

## 2023-04-12 DIAGNOSIS — S61031A Puncture wound without foreign body of right thumb without damage to nail, initial encounter: Secondary | ICD-10-CM | POA: Diagnosis not present

## 2023-04-12 DIAGNOSIS — W5501XA Bitten by cat, initial encounter: Secondary | ICD-10-CM | POA: Diagnosis not present

## 2023-04-12 MED ORDER — AMOXICILLIN-POT CLAVULANATE 875-125 MG PO TABS: 1.0000 | ORAL_TABLET | Freq: Two times a day (BID) | ORAL | 0 refills | Status: DC

## 2023-04-12 MED ORDER — TETANUS-DIPHTH-ACELL PERTUSSIS 5-2.5-18.5 LF-MCG/0.5 IM SUSY
0.5000 mL | PREFILLED_SYRINGE | Freq: Once | INTRAMUSCULAR | Status: AC
  Administered 2023-04-12: 0.5 mL via INTRAMUSCULAR

## 2023-04-12 NOTE — ED Provider Notes (Signed)
HPI  SUBJECTIVE:  Christine Cook is a right-handed 47 y.o. female who presents with multiple cat bites to her distal right thumb, hand and left forearm after trying to rescue a feral kitten from being stuck under a truck.  She estimates that the kitten was no more than 32 to 64 weeks old.  The kitten ran off after biting her.  She reports bruising at several of the bite sites, mild thumb pain.  No hand or thumb, forearm swelling, distal numbness or tingling, limitation of motion of her fingers.  She washed it out copiously with soap, water, and applied an antiseptic spray to this.  No aggravating or alleviating factors.  She has a past medical history of diabetes, hypertension, chronic gallstone pancreatitis.  Last tetanus is unknown..     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   Sleep apnea with hypersomnolence    CPAP study 11-28-09,  10-02-2009 AHI was 16.5, pressure to    Tachycardia     Past Surgical History:  Procedure Laterality Date   ADENOIDECTOMY     BLADDER SURGERY     CHOLECYSTECTOMY     KNEE SURGERY     Left knee x 2    LEEP  10/2016   CIN 1   TONSILLECTOMY      Family History  Problem Relation Age of Onset   Asthma Mother    Hyperthyroidism Mother    Rheum arthritis Mother    Graves' disease Mother    Sjogren's syndrome Mother    Migraines Mother    Asthma Daughter    Migraines Daughter    Colon cancer Neg Hx     Social History   Tobacco Use   Smoking status: Former    Packs/day: 0.25    Years: 2.00    Additional pack years: 0.00    Total pack years: 0.50     Types: Cigarettes    Quit date: 2017    Years since quitting: 7.5   Smokeless tobacco: Never  Vaping Use   Vaping Use: Never used  Substance Use Topics   Alcohol use: No   Drug use: No    No current facility-administered medications for this encounter.  Current Outpatient Medications:    amoxicillin-clavulanate (AUGMENTIN) 875-125 MG tablet, Take 1 tablet by mouth every 12 (twelve) hours., Disp: 14 tablet, Rfl: 0   acetaminophen (TYLENOL) 500 MG tablet, Take 500 mg by mouth as needed. (Patient not taking: Reported on 04/05/2023), Disp: , Rfl:    Asenapine Maleate 10 MG SUBL, Place 1 tablet under the tongue daily., Disp: , Rfl:    BD PEN NEEDLE MICRO U/F 32G X 6 MM MISC, USE AS DIRECTED FIVE TIMES DAILY WITH  INSULIN  PENS, Disp: 400 each, Rfl: 0   blood glucose meter kit and supplies KIT, Dispense based on patient and insurance preference. Use up to four times daily as directed. (FOR ICD-9 250.00, 250.01)., Disp: 1 each, Rfl: 0   citalopram (CELEXA) 40 MG tablet, Take  40 mg by mouth daily., Disp: , Rfl:    Continuous Blood Gluc Sensor (FREESTYLE LIBRE 3 SENSOR) MISC, Place 1 sensor on the skin every 14 days. Use to check glucose continuously, Disp: 6 each, Rfl: 1   Cyanocobalamin (B-12 PO), Take 1,000 mcg by mouth daily., Disp: , Rfl:    diphenhydramine-acetaminophen (TYLENOL PM) 25-500 MG TABS tablet, Take 2 tablets by mouth at bedtime., Disp: , Rfl:    empagliflozin (JARDIANCE) 10 MG TABS tablet, Take 1 tablet (10 mg total) by mouth daily before breakfast. for diabetes., Disp: 90 tablet, Rfl: 0   glucose blood (ONETOUCH ULTRA) test strip, USE TO CHECK BLOOD SUGAR UP TO FOUR TIMES DAILY AS DIRECTED, Disp: 400 each, Rfl: 0   insulin degludec (TRESIBA FLEXTOUCH) 200 UNIT/ML FlexTouch Pen, Inject 45 units every morning and 40 units every evening for diabetes., Disp: 45 mL, Rfl: 1   Lancets (ONETOUCH DELICA PLUS LANCET33G) MISC, , Disp: , Rfl:    levonorgestrel (MIRENA) 20 MCG/24HR IUD, 1  each by Intrauterine route once., Disp: , Rfl:    metFORMIN (GLUCOPHAGE-XR) 500 MG 24 hr tablet, Take 1 tablet (500 mg total) by mouth daily with breakfast. For diabetes., Disp: 90 tablet, Rfl: 1   metoprolol tartrate (LOPRESSOR) 25 MG tablet, Take 1 tablet (25 mg total) by mouth 2 (two) times daily. For heart rate. (Patient not taking: Reported on 04/05/2023), Disp: 180 tablet, Rfl: 1   NOVOLOG FLEXPEN 100 UNIT/ML FlexPen, Inject 25-30 units three times daily with meals for diabetes., Disp: 45 mL, Rfl: 0   Omega-3 Fatty Acids (FISH OIL) 1000 MG CAPS, Take 1 capsule (1,000 mg total) by mouth 2 (two) times daily. With a meal for high triglycerides, Disp: 180 capsule, Rfl: 3   ondansetron (ZOFRAN ODT) 8 MG disintegrating tablet, Take 1 tablet (8 mg total) by mouth every 8 (eight) hours as needed for nausea or vomiting., Disp: 20 tablet, Rfl: 0   pantoprazole (PROTONIX) 40 MG tablet, TAKE 1 TABLET BY MOUTH TWICE DAILY BEFORE A MEAL FOR HEARTBURN, Disp: 180 tablet, Rfl: 0   pravastatin (PRAVACHOL) 80 MG tablet, Take 1 tablet (80 mg total) by mouth daily. for cholesterol. (Patient not taking: Reported on 03/07/2023), Disp: 90 tablet, Rfl: 3   venlafaxine XR (EFFEXOR-XR) 150 MG 24 hr capsule, Take 2 capsules (300 mg total) by mouth daily with breakfast., Disp: 180 capsule, Rfl: 0  Allergies  Allergen Reactions   Codeine Hives and Nausea And Vomiting   Morphine Hives   Neurontin [Gabapentin] Other (See Comments)    syncope   Other Itching     ROS  As noted in HPI.   Physical Exam  BP 123/83 (BP Location: Right Arm)   Pulse 88   Temp 98.1 F (36.7 C) (Oral)   Resp 15   SpO2 98%   Constitutional: Well developed, well nourished, no acute distress Eyes:  EOMI, conjunctiva normal bilaterally HENT: Normocephalic, atraumatic,mucus membranes moist Respiratory: Normal inspiratory effort Cardiovascular: Normal rate GI: nondistended skin: see MSK exam Musculoskeletal:  R Hand: Mildly tender  puncture wound with bruising inferior to the right MP joint.   Multiple puncture wounds thumb.  Cap refill less than 2 seconds.  Sensation grossly intact.  Patient able to move thumb through full range of motion without any problem.  No expressible purulent drainage      Left forearm: 2 mildly tender puncture wounds.  No expressible purulent drainage.    Neurologic: Alert & oriented x 3, no focal neuro deficits  Psychiatric: Speech and behavior appropriate   ED Course   Medications  Tdap (BOOSTRIX) injection 0.5 mL (0.5 mLs Intramuscular Given 04/12/23 1227)    No orders of the defined types were placed in this encounter.   No results found for this or any previous visit (from the past 24 hour(s)). No results found.  ED Clinical Impression  1. Cat bite, initial encounter   2. Puncture wound of right thumb, initial encounter   3. Multiple puncture wounds      ED Assessment/Plan     Patient presents with multiple cat bites to both upper extremities.  Updating tetanus.  Patient declined rabies series although discussed with the that it is a uniformly fatal disease and they do not have the cat in  quarantine.  She declined x-rays to rule out foreign body..  Will send home with Augmentin for 1 week, may take Tylenol and ibuprofen together as needed.  Discussed the unpredictable nature of animal bites to the hand.  She is to go to the ER for any signs of infection.  Discussed  MDM, treatment plan, and plan for follow-up with patient. Discussed sn/sx that should prompt return to the ED. patient agrees with plan.   Meds ordered this encounter  Medications   Tdap (BOOSTRIX) injection 0.5 mL   amoxicillin-clavulanate (AUGMENTIN) 875-125 MG tablet    Sig: Take 1 tablet by mouth every 12 (twelve) hours.    Dispense:  14 tablet    Refill:  0      *This clinic note was created using Scientist, clinical (histocompatibility and immunogenetics). Therefore, there may be occasional mistakes despite careful  proofreading.  ?    Domenick Gong, MD 04/12/23 1332

## 2023-04-12 NOTE — ED Triage Notes (Signed)
Pt c/o kitten bite to the right thumb and arm.  This morning along with her father.

## 2023-04-12 NOTE — Discharge Instructions (Signed)
Continue keeping this clean with soap and water.  Finish the Augmentin.  You may take Tylenol and ibuprofen together 3 times a day as needed for pain.  Go to the ER for any signs of infection.

## 2023-04-13 LAB — GIARDIA ANTIGEN
MICRO NUMBER:: 15158746
RESULT:: NOT DETECTED
SPECIMEN QUALITY:: ADEQUATE

## 2023-04-13 LAB — GASTROINTESTINAL PATHOGEN PNL
Norovirus GI/GII: NOT DETECTED
Rotavirus A: NOT DETECTED
Salmonella species: NOT DETECTED
Shiga Toxin 2: NOT DETECTED
Shigella Species: NOT DETECTED

## 2023-04-13 NOTE — Progress Notes (Signed)
Let patient know sleep study showed mild obstructive sleep apnea, she had average 8.9 apneic events an hour.  This has significantly improved from previous sleep study that showed severe sleep apnea.  She is not a candidate for inspire device or oral appliance.  She does not need CPAP unless very symptomatic but we can order if she would like to re-try.  Otherwise continue to work on weight loss efforts, focus on side sleeping position, avoid alcohol/sedatives before bedtime.

## 2023-05-21 ENCOUNTER — Other Ambulatory Visit: Payer: Self-pay | Admitting: Primary Care

## 2023-05-21 DIAGNOSIS — I10 Essential (primary) hypertension: Secondary | ICD-10-CM

## 2023-05-21 DIAGNOSIS — R000 Tachycardia, unspecified: Secondary | ICD-10-CM

## 2023-06-01 DIAGNOSIS — K1370 Unspecified lesions of oral mucosa: Secondary | ICD-10-CM

## 2023-06-02 NOTE — Telephone Encounter (Signed)
This can wait for Jae Dire to see or she can schedule an office visit with one of [place]

## 2023-06-02 NOTE — Telephone Encounter (Signed)
Called and advised patient, she would like to wait on a response from [facility]. She is aware their may be a delay.

## 2023-06-03 MED ORDER — NYSTATIN-TRIAMCINOLONE 100000-0.1 UNIT/GM-% EX CREA: 1.0000 | TOPICAL_CREAM | Freq: Two times a day (BID) | CUTANEOUS | 0 refills | Status: DC | PRN

## 2023-06-12 ENCOUNTER — Other Ambulatory Visit: Payer: Self-pay | Admitting: Primary Care

## 2023-06-12 DIAGNOSIS — K219 Gastro-esophageal reflux disease without esophagitis: Secondary | ICD-10-CM

## 2023-06-18 ENCOUNTER — Other Ambulatory Visit: Payer: Self-pay | Admitting: Primary Care

## 2023-06-18 DIAGNOSIS — E785 Hyperlipidemia, unspecified: Secondary | ICD-10-CM

## 2023-06-25 ENCOUNTER — Other Ambulatory Visit: Payer: Self-pay | Admitting: Primary Care

## 2023-06-25 DIAGNOSIS — Z794 Long term (current) use of insulin: Secondary | ICD-10-CM

## 2023-07-01 ENCOUNTER — Ambulatory Visit
Admission: RE | Admit: 2023-07-01 | Discharge: 2023-07-01 | Disposition: A | Discharge status: Home / Self Care | Payer: No Typology Code available for payment source | Source: Ambulatory Visit | Attending: Family Medicine | Admitting: Family Medicine

## 2023-07-01 VITALS — BP 151/88 | HR 90 | Temp 98.3°F | Resp 17 | Wt 180.0 lb

## 2023-07-01 DIAGNOSIS — H109 Unspecified conjunctivitis: Secondary | ICD-10-CM | POA: Diagnosis not present

## 2023-07-01 DIAGNOSIS — B9689 Other specified bacterial agents as the cause of diseases classified elsewhere: Secondary | ICD-10-CM

## 2023-07-01 DIAGNOSIS — J0100 Acute maxillary sinusitis, unspecified: Secondary | ICD-10-CM

## 2023-07-01 MED ORDER — POLYMYXIN B-TRIMETHOPRIM 10000-0.1 UNIT/ML-% OP SOLN: 2.0000 [drp] | OPHTHALMIC | 0 refills | Status: DC

## 2023-07-01 MED ORDER — AMOXICILLIN-POT CLAVULANATE 500-125 MG PO TABS: 1.0000 | ORAL_TABLET | Freq: Three times a day (TID) | ORAL | 0 refills | Status: DC

## 2023-07-01 NOTE — Discharge Instructions (Addendum)
May be contagious for first 24 to 48 hours.  Follow as needed.  Regular monitoring of blood pressure recommended.

## 2023-07-01 NOTE — ED Triage Notes (Signed)
Woke up with both eyes crusty. Left eye is worse.

## 2023-07-01 NOTE — ED Provider Notes (Signed)
[facility]    CSN: [id number] Arrival date & time: 07/01/23  1201      History   Chief Complaint Chief Complaint  Patient presents with   Conjunctivitis    Sinus infection - Entered by patient    HPI Christine Cook is a 47 y.o. female.   Patient reports that for last few days she has been battling with sinus pressure sinus pain and drainage.  Denies any fever chills or cough.  Denies any ear pain.  This morning she woke up with bilateral eye pain and purulent drainage from the left eye.  No visual disturbance.  No known exposure.  No foreign body exposure.  Patient does not wear contact lenses.  Denies any visual disturbance.  Denies any fever or chills.   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   Sleep apnea with hypersomnolence    CPAP study 11-28-09,  10-02-2009 AHI was 16.5, pressure to    Tachycardia     Patient Active Problem List   Diagnosis Date Noted   Acute diarrhea 04/05/2023   GAD (generalized anxiety disorder) 11/05/2022   Type 2 diabetes mellitus with diabetic neuropathy (HCC) 08/05/2022   Chronic fatigue 12/11/2021   Encounter for annual general medical examination with abnormal findings in adult 12/11/2021   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   Type 2 diabetes mellitus with hyperglycemia, with long-term current use of insulin (HCC) 01/22/2021   Chronic pancreatitis (HCC) 01/13/2021   Screening for cervical cancer 09/11/2019   Arthralgia of right temporomandibular joint 02/15/2019   Plantar fasciitis 01/02/2018    Hypokalemia 11/29/2017   Cervical dysplasia 12/01/2016   Hyperlipidemia 08/05/2016   Tachycardia 08/05/2016   OSA (obstructive sleep apnea) 07/12/2016   Narcolepsy 07/12/2016   Vitamin B12 deficiency 07/12/2016   Vitamin D deficiency 07/12/2016   Depression 07/11/2016   Impulse control disorder (gambling) 07/11/2016   HTN (hypertension) 07/09/2016   GERD (gastroesophageal reflux disease) 01/21/2009    Past Surgical History:  Procedure Laterality Date   ADENOIDECTOMY     BLADDER SURGERY     CHOLECYSTECTOMY     KNEE SURGERY     Left knee x 2    LEEP  10/2016   CIN 1   TONSILLECTOMY      OB History   No obstetric history on file.      Home Medications    Prior to Admission medications   Medication Sig Start Date End Date Taking? Authorizing Provider  amoxicillin-clavulanate (AUGMENTIN) 500-125 MG tablet Take 1 tablet by mouth every 8 (eight) hours. 07/01/23  Yes Lura Em, MD  citalopram (CELEXA) 40 MG tablet Take 40 mg by mouth daily. 02/08/23  Yes [provider]  empagliflozin (JARDIANCE) 10 MG TABS tablet Take 1 tablet (10 mg total) by mouth daily before breakfast. for diabetes. 12/06/22  Yes Doreene Nest, NP  glucose blood (ONETOUCH ULTRA) test strip USE TO CHECK BLOOD SUGAR UP TO FOUR TIMES DAILY AS DIRECTED 12/15/22  Yes Doreene Nest, NP  insulin degludec (TRESIBA FLEXTOUCH) 200 UNIT/ML FlexTouch Pen Inject 45 units every morning and 40 units every evening for diabetes. 04/29/22  Yes Doreene Nest, NP  Lancets [facility] DELICA PLUS [place]) MISC  10/29/20  Yes [provider]  levonorgestrel (MIRENA) 20 MCG/24HR IUD 1 each by Intrauterine route once. 01/16/14  Yes Larene Pickett, FNP  metFORMIN (GLUCOPHAGE-XR) 500 MG 24 hr tablet Take 1 tablet (500 mg total) by mouth daily with breakfast. For diabetes. 11/05/22  Yes Doreene Nest, NP  metoprolol tartrate (LOPRESSOR) 25 MG tablet TAKE 1 TABLET BY MOUTH TWICE DAILY FOR HEART  RATE. 05/22/23  Yes Doreene Nest, NP  NOVOLOG FLEXPEN 100 UNIT/ML FlexPen Inject 25-30 units three times daily with meals for diabetes. 10/08/22  Yes Doreene Nest, NP  pantoprazole (PROTONIX) 40 MG tablet TAKE 1 TABLET BY MOUTH TWICE DAILY BEFORE A MEAL FOR HEARTBURN 06/12/23  Yes Doreene Nest, NP  pravastatin (PRAVACHOL) 80 MG tablet TAKE 1 TABLET BY MOUTH ONCE DAILY FOR CHOLESTEROL 06/19/23  Yes Doreene Nest, NP  trimethoprim-polymyxin b (POLYTRIM) ophthalmic solution Place 2 drops into both eyes every 4 (four) hours. 07/01/23  Yes Lura Em, MD  venlafaxine XR (EFFEXOR-XR) 150 MG 24 hr capsule Take 2 capsules (300 mg total) by mouth daily with breakfast. 08/13/22  Yes Doreene Nest, NP  acetaminophen (TYLENOL) 500 MG tablet Take 500 mg by mouth as needed. Patient not taking: Reported on 04/05/2023 01/16/21   [provider]  Asenapine Maleate 10 MG SUBL Place 1 tablet under the tongue daily. 03/06/23   [provider]  BD PEN NEEDLE MICRO U/F 32G X 6 MM MISC USE AS DIRECTED 5 TIMES DAILY WITH INSULIN PEN 06/26/23   Doreene Nest, NP  blood glucose meter kit and supplies KIT Dispense based on patient and insurance preference. Use up to four times daily as directed. (FOR ICD-9 250.00, 250.01). 10/28/20   Doreene Nest, NP  Continuous Blood Gluc Sensor (FREESTYLE LIBRE 3 SENSOR) MISC Place 1 sensor on the skin every 14 days. Use to check glucose continuously 11/05/22   Doreene Nest, NP  Cyanocobalamin (B-12 PO) Take 1,000 mcg by mouth daily.    [provider]  diphenhydramine-acetaminophen (TYLENOL PM) 25-500 MG TABS tablet Take 2 tablets by mouth at bedtime.    [provider]  nystatin-triamcinolone (MYCOLOG II) cream Apply 1 Application topically 2 (two) times daily as needed. 06/03/23   Doreene Nest, NP  Omega-3 Fatty Acids (FISH OIL) 1000 MG CAPS Take 1 capsule (1,000 mg total) by mouth 2 (two) times daily. With a  meal for high triglycerides 04/06/23   Doreene Nest, NP  ondansetron (ZOFRAN ODT) 8 MG disintegrating tablet Take 1 tablet (8 mg total) by mouth every 8 (eight) hours as needed for nausea or vomiting. 11/05/22   Doreene Nest, NP  atorvastatin (LIPITOR) 80 MG tablet Take 1 tablet by mouth every evening for cholesterol. 03/26/19 08/06/20  Doreene Nest, NP  diphenhydrAMINE (BENADRYL) 25 mg capsule Take 2 capsules (50 mg total) by mouth every 6 (six) hours as needed. 04/07/20 08/06/20  Sharman Cheek, MD  metoCLOPramide (REGLAN) 10 MG tablet Take 1 tablet (10 mg total) by mouth every 6 (six) hours as needed. 04/07/20 08/06/20  Sharman Cheek, MD  potassium chloride SA (K-DUR) 20 MEQ tablet Take 1 tablet by mouth once daily 04/12/19 08/06/20  Doreene Nest, NP  QUEtiapine (SEROQUEL XR)  50 MG TB24 24 hr tablet Take 1 tablet (50 mg total) by mouth at bedtime. 07/09/19 08/06/20  Joaquim Nam, MD    Family History Family History  Problem Relation Age of Onset   Asthma Mother    Hyperthyroidism Mother    Rheum arthritis Mother    Graves' disease Mother    Sjogren's syndrome Mother    Migraines Mother    Asthma Daughter    Migraines Daughter    Colon cancer Neg Hx     Social History Social History   Tobacco Use   Smoking status: Former    Current packs/day: 0.00    Average packs/day: 0.3 packs/day for 2.0 years (0.5 ttl pk-yrs)    Types: Cigarettes    Start date: 2015    Quit date: 2017    Years since quitting: 7.7   Smokeless tobacco: Never  Vaping Use   Vaping status: Never Used  Substance Use Topics   Alcohol use: No   Drug use: No     Allergies   Codeine, Morphine, Neurontin [gabapentin], and Other   Review of Systems Review of Systems Negative other than mentioned in HPI.  Physical Exam Triage Vital Signs ED Triage Vitals  Encounter Vitals Group     BP 07/01/23 1211 (!) 151/88     Systolic BP Percentile --      Diastolic BP Percentile --      Pulse  Rate 07/01/23 1211 90     Resp 07/01/23 1211 17     Temp 07/01/23 1211 98.3 F (36.8 C)     Temp Source 07/01/23 1211 Oral     SpO2 07/01/23 1211 94 %     Weight 07/01/23 1209 180 lb (81.6 kg)     Height --      Head Circumference --      Peak Flow --      Pain Score 07/01/23 1209 5     Pain Loc --      Pain Education --      Exclude from Growth Chart --    No data found.  Updated Vital Signs BP (!) 151/88 (BP Location: Right Arm)   Pulse 90   Temp 98.3 F (36.8 C) (Oral)   Resp 17   Wt 81.6 kg   SpO2 94%   BMI 29.95 kg/m   Visual Acuity Right Eye Distance:   Left Eye Distance:   Bilateral Distance:    Right Eye Near:   Left Eye Near:    Bilateral Near:     Physical Exam Alert awake oriented to time place person no acute distress.  Head is atraumatic normocephalic.  No facial swelling noted.  Bilateral maxillary sinus tenderness and frontal sinus tenderness present.  Bilateral conjunctival redness noted on palpebral and bulbar conjunctiva with purulent drainage bilaterally left worse than right.  Pupils are reactive to light.  No corneal abrasion noted.  Corneal and pupillary reflex intact.  Extraocular movements are preserved.  Periorbital soft tissue swelling absent.  No skin rash noted on the face.  S1-S2 heard no murmur no gallop noted.  Bilateral air entry present no wheezing or rhonchi noted.  UC Treatments / Results  Labs (all labs ordered are listed, but only abnormal results are displayed) Labs Reviewed - No data to display  EKG   Radiology No results found.  Procedures Procedures (including critical care time)  Medications Ordered in UC Medications - No data to display  Initial Impression / Assessment and Plan /  UC Course  I have reviewed the triage vital signs and the nursing notes.  Pertinent labs & imaging results that were available during my care of the patient were reviewed by me and considered in my medical decision making (see chart for  details).      Final Clinical Impressions(s) / UC Diagnoses   Final diagnoses:  Bacterial conjunctivitis of both eyes  Acute non-recurrent maxillary sinusitis   Discharge Instructions   None    ED Prescriptions     Medication Sig Dispense Auth. Provider   trimethoprim-polymyxin b (POLYTRIM) ophthalmic solution Place 2 drops into both eyes every 4 (four) hours. 10 mL Lura Em, MD   amoxicillin-clavulanate (AUGMENTIN) 500-125 MG tablet Take 1 tablet by mouth every 8 (eight) hours. 21 tablet Lura Em, MD      PDMP not reviewed this encounter.   Lura Em, MD 07/01/23 1231

## 2023-08-22 LAB — HEMOGLOBIN A1C: Hemoglobin A1C: 8.4

## 2023-09-07 ENCOUNTER — Ambulatory Visit
Admission: EM | Admit: 2023-09-07 | Discharge: 2023-09-07 | Disposition: A | Discharge status: Home / Self Care | Payer: No Typology Code available for payment source

## 2023-09-07 DIAGNOSIS — G44209 Tension-type headache, unspecified, not intractable: Secondary | ICD-10-CM | POA: Diagnosis not present

## 2023-09-07 MED ORDER — RIZATRIPTAN BENZOATE 10 MG PO TABS: 10.0000 mg | ORAL_TABLET | ORAL | 0 refills | Status: DC | PRN

## 2023-09-07 MED ORDER — BUTALBITAL-APAP-CAFFEINE 50-325-40 MG PO TABS
1.0000 | ORAL_TABLET | Freq: Four times a day (QID) | ORAL | 0 refills | Status: DC | PRN
Start: 2023-09-07 — End: 2024-04-05

## 2023-09-07 NOTE — ED Triage Notes (Addendum)
Pt c/o R sided HA & otalgia since this AM. Hx of migraines. Denies any nausea,dizziness or blurred vision.

## 2023-09-07 NOTE — Discharge Instructions (Addendum)
Go home and rest in a cool dark environment.  Use the Fioricet, 1 to 2 tablets every 6 hours, as needed for tension headache pain.  No more than 6 tablets in a 24-hour period.  Be mindful this medication will make you drowsy.  You can take Maxalt 10 mg as needed for severe symptoms.  You may repeat your dose in 2 hours if no resolution.  Maximum of 2 tablets within a 24-hour period.  If the Maxalt makes you tachycardic stop taking it and contact your primary care provider about a prescription for Frova, since you indicate that you have used this in the past with good results.  If your headache does not resolve, or worsens, please go to the ER for evaluation.

## 2023-09-07 NOTE — ED Provider Notes (Signed)
[facility]    CSN: [id number] Arrival date & time: 09/07/23  1625      History   Chief Complaint Chief Complaint  Patient presents with   Headache   Otalgia    HPI Christine Cook is a 47 y.o. female.   HPI  47 year old female with a past medical history significant for GERD, hypertension, OSA, type 2 diabetes, chronic pancreatitis, and migraine headaches presents for evaluation of right-sided headache with associated right ear pain.  She does endorse some mild nasal congestion but she denies any runny nose, changes to hearing, ringing in her ear, drainage from the ear, fever, or cough.  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   Sleep apnea with hypersomnolence    CPAP study 11-28-09,  10-02-2009 AHI was 16.5, pressure to    Tachycardia     Patient Active Problem List   Diagnosis Date Noted   Acute diarrhea 04/05/2023   GAD (generalized anxiety disorder) 11/05/2022   Type 2 diabetes mellitus with diabetic neuropathy (HCC) 08/05/2022   Chronic fatigue 12/11/2021   Encounter for annual general medical examination with abnormal findings in adult 12/11/2021   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   Type 2 diabetes mellitus with hyperglycemia, with long-term current use of insulin (HCC) 01/22/2021   Chronic pancreatitis (HCC) 01/13/2021   Screening for cervical cancer 09/11/2019   Arthralgia of right temporomandibular joint 02/15/2019   Plantar fasciitis 01/02/2018   Hypokalemia 11/29/2017   Cervical dysplasia 12/01/2016   Hyperlipidemia 08/05/2016    Tachycardia 08/05/2016   OSA (obstructive sleep apnea) 07/12/2016   Narcolepsy 07/12/2016   Vitamin B12 deficiency 07/12/2016   Vitamin D deficiency 07/12/2016   Depression 07/11/2016   Impulse control disorder (gambling) 07/11/2016   HTN (hypertension) 07/09/2016   GERD (gastroesophageal reflux disease) 01/21/2009    Past Surgical History:  Procedure Laterality Date   ADENOIDECTOMY     BLADDER SURGERY     CHOLECYSTECTOMY     KNEE SURGERY     Left knee x 2    LEEP  10/2016   CIN 1   TONSILLECTOMY      OB History   No obstetric history on file.      Home Medications    Prior to Admission medications   Medication Sig Start Date End Date Taking? Authorizing Provider  Asenapine Maleate 10 MG SUBL Place 1 tablet under the tongue daily. 03/06/23  Yes [provider]  BD PEN NEEDLE MICRO U/F 32G X 6 MM MISC USE AS DIRECTED 5 TIMES DAILY WITH INSULIN PEN 06/26/23  Yes Doreene Nest, NP  blood glucose meter kit and supplies KIT Dispense based on patient and insurance preference. Use up to four times daily as directed. (FOR ICD-9 250.00, 250.01). 10/28/20  Yes Doreene Nest, NP  butalbital-acetaminophen-caffeine (FIORICET) 50-325-40 MG tablet Take 1-2 tablets by mouth every 6 (six) hours as needed for headache. No more than 6 tablets in a 24-hour period. 09/07/23 09/06/24 Yes Becky Augusta, NP  celecoxib (CELEBREX) 200 MG capsule Take 200 mg by mouth 2 (two) times daily. 08/24/23  Yes [provider]  citalopram (CELEXA) 40 MG tablet Take 40 mg by mouth daily. 02/08/23  Yes [provider]  Continuous Blood Gluc Sensor (FREESTYLE LIBRE 3 SENSOR) MISC Place 1 sensor on the skin every 14 days. Use to check glucose continuously 11/05/22  Yes Doreene Nest, NP  Cyanocobalamin (B-12 PO) Take 1,000 mcg by mouth daily.   Yes [provider]  diphenhydramine-acetaminophen (TYLENOL PM) 25-500 MG TABS tablet Take 2 tablets by mouth at bedtime.   Yes  [provider]  empagliflozin (JARDIANCE) 10 MG TABS tablet Take 1 tablet (10 mg total) by mouth daily before breakfast. for diabetes. 12/06/22  Yes Doreene Nest, NP  glucose blood (ONETOUCH ULTRA) test strip USE TO CHECK BLOOD SUGAR UP TO FOUR TIMES DAILY AS DIRECTED 12/15/22  Yes Doreene Nest, NP  insulin degludec (TRESIBA FLEXTOUCH) 200 UNIT/ML FlexTouch Pen Inject 45 units every morning and 40 units every evening for diabetes. 04/29/22  Yes Doreene Nest, NP  Lancets [facility] DELICA PLUS [place]) MISC  10/29/20  Yes [provider]  levonorgestrel (MIRENA) 20 MCG/24HR IUD 1 each by Intrauterine route once. 01/16/14  Yes Larene Pickett, FNP  metFORMIN (GLUCOPHAGE-XR) 500 MG 24 hr tablet Take 1 tablet (500 mg total) by mouth daily with breakfast. For diabetes. 11/05/22  Yes Doreene Nest, NP  metoprolol tartrate (LOPRESSOR) 25 MG tablet TAKE 1 TABLET BY MOUTH TWICE DAILY FOR HEART RATE. 05/22/23  Yes Doreene Nest, NP  NOVOLOG FLEXPEN 100 UNIT/ML FlexPen Inject 25-30 units three times daily with meals for diabetes. 10/08/22  Yes Doreene Nest, NP  nystatin-triamcinolone (MYCOLOG II) cream Apply 1 Application topically 2 (two) times daily as needed. 06/03/23  Yes Doreene Nest, NP  Omega-3 Fatty Acids (FISH OIL) 1000 MG CAPS Take 1 capsule (1,000 mg total) by mouth 2 (two) times daily. With a meal for high triglycerides 04/06/23  Yes Doreene Nest, NP  ondansetron (ZOFRAN ODT) 8 MG disintegrating tablet Take 1 tablet (8 mg total) by mouth every 8 (eight) hours as needed for nausea or vomiting. 11/05/22  Yes Doreene Nest, NP  pantoprazole (PROTONIX) 40 MG tablet TAKE 1 TABLET BY MOUTH TWICE DAILY BEFORE A MEAL FOR HEARTBURN 06/12/23  Yes Doreene Nest, NP  pravastatin (PRAVACHOL) 80 MG tablet TAKE 1 TABLET BY MOUTH ONCE DAILY FOR CHOLESTEROL 06/19/23  Yes Doreene Nest, NP  pregabalin (LYRICA) 50 MG capsule Take 50 mg by mouth 2 (two) times  daily. 04/28/23  Yes [provider]  rizatriptan (MAXALT) 10 MG tablet Take 1 tablet (10 mg total) by mouth as needed for migraine. May repeat in 2 hours if needed 09/07/23  Yes Becky Augusta, NP  venlafaxine XR (EFFEXOR-XR) 150 MG 24 hr capsule Take 2 capsules (300 mg total) by mouth daily with breakfast. 08/13/22  Yes Doreene Nest, NP  atorvastatin (LIPITOR) 80 MG tablet Take 1 tablet by mouth every evening for cholesterol. 03/26/19 08/06/20  Doreene Nest, NP  diphenhydrAMINE (BENADRYL) 25 mg capsule Take 2 capsules (50 mg total) by mouth every 6 (six) hours as needed. 04/07/20 08/06/20  Sharman Cheek, MD  metoCLOPramide (REGLAN) 10 MG tablet Take 1 tablet (10 mg total) by mouth every 6 (six) hours as needed. 04/07/20 08/06/20  Sharman Cheek, MD  potassium chloride SA (K-DUR) 20 MEQ tablet Take 1 tablet by mouth once daily  04/12/19 08/06/20  Doreene Nest, NP  QUEtiapine (SEROQUEL XR) 50 MG TB24 24 hr tablet Take 1 tablet (50 mg total) by mouth at bedtime. 07/09/19 08/06/20  Joaquim Nam, MD    Family History Family History  Problem Relation Age of Onset   Asthma Mother    Hyperthyroidism Mother    Rheum arthritis Mother    Graves' disease Mother    Sjogren's syndrome Mother    Migraines Mother    Asthma Daughter    Migraines Daughter    Colon cancer Neg Hx     Social History Social History   Tobacco Use   Smoking status: Former    Current packs/day: 0.00    Average packs/day: 0.3 packs/day for 2.0 years (0.5 ttl pk-yrs)    Types: Cigarettes    Start date: 2015    Quit date: 2017    Years since quitting: 7.9   Smokeless tobacco: Never  Vaping Use   Vaping status: Never Used  Substance Use Topics   Alcohol use: No   Drug use: No     Allergies   Gabapentin, Codeine, Other, and Morphine   Review of Systems Review of Systems  Constitutional:  Negative for fever.  HENT:  Positive for congestion and ear pain. Negative for ear discharge, hearing  loss, rhinorrhea and tinnitus.   Respiratory:  Negative for cough.   Neurological:  Positive for headaches.     Physical Exam Triage Vital Signs ED Triage Vitals  Encounter Vitals Group     BP      Systolic BP Percentile      Diastolic BP Percentile      Pulse      Resp      Temp      Temp src      SpO2      Weight      Height      Head Circumference      Peak Flow      Pain Score      Pain Loc      Pain Education      Exclude from Growth Chart    No data found.  Updated Vital Signs BP (!) 142/87 (BP Location: Left Arm)   Pulse 89   Temp 98.9 F (37.2 C) (Oral)   Resp 16   Ht 5\' 4"  (1.626 m)   Wt 180 lb (81.6 kg)   SpO2 95%   BMI 30.90 kg/m   Visual Acuity Right Eye Distance:   Left Eye Distance:   Bilateral Distance:    Right Eye Near:   Left Eye Near:    Bilateral Near:     Physical Exam Vitals and nursing note reviewed.  Constitutional:      Appearance: Normal appearance. She is not ill-appearing.  HENT:     Head: Normocephalic and atraumatic.     Right Ear: Tympanic membrane, ear canal and external ear normal. There is no impacted cerumen.     Left Ear: Tympanic membrane, ear canal and external ear normal. There is no impacted cerumen.     Ears:     Comments: Patient has mild tenderness to palpation of the right eustachian tube externally. Skin:    General: Skin is warm and dry.     Capillary Refill: Capillary refill takes less than 2 seconds.     Findings: No erythema.  Neurological:     General: No focal deficit present.     Mental Status: She is alert  and oriented to person, place, and time.      UC Treatments / Results  Labs (all labs ordered are listed, but only abnormal results are displayed) Labs Reviewed - No data to display  EKG   Radiology No results found.  Procedures Procedures (including critical care time)  Medications Ordered in UC Medications - No data to display  Initial Impression / Assessment and Plan / UC  Course  I have reviewed the triage vital signs and the nursing notes.  Pertinent labs & imaging results that were available during my care of the patient were reviewed by me and considered in my medical decision making (see chart for details).   Patient is a pleasant, nontoxic-appearing 47 year old female presenting for evaluation of right-sided headache and right ear pain as outlined HPI above.  She does have nasal congestion but no other upper or lower respiratory symptoms.  Visual inspection of the right external auditory canal reveals a clear canal free of edema or erythema and a tympanic membrane that is pearly gray in appearance with a normal light reflex.  She does have tenderness with movement of the auricle of the ear as well as with palpation of the eustachian tube externally on the right side.  She was able to clear her right eustachian tube using the Valsalva maneuver and it did not improve her pain.  She describes her pain as a prickly sensation on the right parietal occipital area.  With palpation of this area I am able to reproduce the pain and noticed that there is muscle tension present.  I suspect most likely her symptoms are coming from a tension headache.  Patient does have a history of migraines and tension headaches and typically uses Excedrin Migraine.  She has used that today without any improvement of symptoms.  She also reports that in the past she has used Frova and did not tolerate Imitrex.  I will discharge her home with prescription for Fioricet to use as needed for her tension headaches and we will also do a trial of Maxalt, as patient is willing to try this given that Frova requires a prior Auth.  I advised her that if she becomes tachycardic she should stop taking it and contact her PCP regarding a prescription for Frova.   Final Clinical Impressions(s) / UC Diagnoses   Final diagnoses:  Tension headache     Discharge Instructions      Go home and rest in a cool  dark environment.  Use the Fioricet, 1 to 2 tablets every 6 hours, as needed for tension headache pain.  No more than 6 tablets in a 24-hour period.  Be mindful this medication will make you drowsy.  You can take Maxalt 10 mg as needed for severe symptoms.  You may repeat your dose in 2 hours if no resolution.  Maximum of 2 tablets within a 24-hour period.  If the Maxalt makes you tachycardic stop taking it and contact your primary care provider about a prescription for Frova, since you indicate that you have used this in the past with good results.  If your headache does not resolve, or worsens, please go to the ER for evaluation.      ED Prescriptions     Medication Sig Dispense Auth. Provider   rizatriptan (MAXALT) 10 MG tablet Take 1 tablet (10 mg total) by mouth as needed for migraine. May repeat in 2 hours if needed 10 tablet Becky Augusta, NP   butalbital-acetaminophen-caffeine (FIORICET) 414 [id number] MG tablet  Take 1-2 tablets by mouth every 6 (six) hours as needed for headache. No more than 6 tablets in a 24-hour period. 20 tablet Becky Augusta, NP      I have reviewed the PDMP during this encounter.   Becky Augusta, NP 09/07/23 1736

## 2023-09-20 ENCOUNTER — Other Ambulatory Visit: Payer: Self-pay | Admitting: Primary Care

## 2023-09-20 DIAGNOSIS — E1165 Type 2 diabetes mellitus with hyperglycemia: Secondary | ICD-10-CM

## 2023-09-30 NOTE — Telephone Encounter (Signed)
Agree that patient needs evaluation.

## 2023-11-04 ENCOUNTER — Ambulatory Visit: Payer: No Typology Code available for payment source | Admitting: Primary Care

## 2023-11-04 ENCOUNTER — Encounter: Payer: Self-pay | Admitting: Primary Care

## 2023-11-04 VITALS — BP 138/82 | HR 82 | Temp 97.3°F | Ht 64.0 in | Wt 182.0 lb

## 2023-11-04 DIAGNOSIS — R229 Localized swelling, mass and lump, unspecified: Secondary | ICD-10-CM | POA: Insufficient documentation

## 2023-11-04 DIAGNOSIS — G43009 Migraine without aura, not intractable, without status migrainosus: Secondary | ICD-10-CM | POA: Diagnosis not present

## 2023-11-04 MED ORDER — TOPIRAMATE 50 MG PO TABS: 50.0000 mg | ORAL_TABLET | Freq: Every day | ORAL | 0 refills | Status: DC

## 2023-11-04 MED ORDER — RIZATRIPTAN BENZOATE 10 MG PO TABS
ORAL_TABLET | ORAL | 0 refills | Status: DC
Start: 2023-11-04 — End: 2024-02-08

## 2023-11-04 NOTE — Assessment & Plan Note (Signed)
Uncontrolled.  Discussed the risk for rebound headaches from excessive use of Excedrin Migraine.  Recommended to discontinue.  Start topiramate 50 mg at bedtime for migraine/headache prevention. Refills provided for Maxalt 10 mg.  Discussed that she may take an additional tablet 2 hours later if migraine persists.  Drowsiness precautions provided.

## 2023-11-04 NOTE — Patient Instructions (Signed)
You may take the Maxalt at migraine onset.  You may take a second tablet 2 hours later if migraine persists.  Do not exceed 2 tablets in 24 hours.  Start topiramate (Topamax) 50 mg at bedtime for headache prevention.  It was a pleasure to see you today!
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
## 2023-11-04 NOTE — Assessment & Plan Note (Signed)
Evaluation today representative of cyst or lipoma. No folliculitis, cellulitis noted.  Discussed options which included monitoring versus soft tissue ultrasound. She will monitor since she has already noticed a slight reduction and report if there is not resolved.

## 2023-12-23 ENCOUNTER — Other Ambulatory Visit: Payer: Self-pay | Admitting: Primary Care

## 2023-12-23 DIAGNOSIS — Z794 Long term (current) use of insulin: Secondary | ICD-10-CM

## 2024-01-22 ENCOUNTER — Other Ambulatory Visit: Payer: Self-pay | Admitting: Primary Care

## 2024-01-22 DIAGNOSIS — G43009 Migraine without aura, not intractable, without status migrainosus: Secondary | ICD-10-CM

## 2024-01-23 NOTE — Telephone Encounter (Signed)
Patient is due for CPE/follow up in mid July, this will be required prior to any further refills.  Please schedule, thank you!   

## 2024-01-24 NOTE — Telephone Encounter (Signed)
 Patient scheduled.

## 2024-01-26 DIAGNOSIS — G43009 Migraine without aura, not intractable, without status migrainosus: Secondary | ICD-10-CM

## 2024-02-07 ENCOUNTER — Other Ambulatory Visit: Payer: Self-pay | Admitting: Primary Care

## 2024-02-07 DIAGNOSIS — G43009 Migraine without aura, not intractable, without status migrainosus: Secondary | ICD-10-CM

## 2024-02-10 ENCOUNTER — Telehealth: Payer: Self-pay

## 2024-02-10 NOTE — Transitions of Care (Post Inpatient/ED Visit) (Signed)
 Unable to reach patient by phone and left v/m requesting call back at [phone].          02/10/2024  Name: Christine Cook MRN: [id number] DOB: 1976/09/06  Today's TOC FU Call Status: Today's TOC FU Call Status:: Unsuccessful Call (1st Attempt) Unsuccessful Call (1st Attempt) Date: 02/10/24  Attempted to reach the patient regarding the most recent Inpatient/ED visit.  Follow Up Plan: Additional outreach attempts will be made to reach the patient to complete the Transitions of Care (Post Inpatient/ED visit) call.   Signature Claretha Crocker, LPN

## 2024-02-16 ENCOUNTER — Telehealth: Payer: Self-pay

## 2024-02-16 ENCOUNTER — Inpatient Hospital Stay: Admitting: Family Medicine

## 2024-02-16 ENCOUNTER — Other Ambulatory Visit (HOSPITAL_COMMUNITY): Payer: Self-pay

## 2024-02-16 ENCOUNTER — Ambulatory Visit: Admitting: Primary Care

## 2024-02-16 ENCOUNTER — Encounter: Payer: Self-pay | Admitting: Primary Care

## 2024-02-16 VITALS — BP 132/84 | HR 94 | Temp 97.7°F | Ht 64.0 in | Wt 180.0 lb

## 2024-02-16 DIAGNOSIS — G43019 Migraine without aura, intractable, without status migrainosus: Secondary | ICD-10-CM | POA: Diagnosis not present

## 2024-02-16 HISTORY — DX: Migraine without aura, intractable, without status migrainosus: G43.019

## 2024-02-16 MED ORDER — CYCLOBENZAPRINE HCL 10 MG PO TABS
10.0000 mg | ORAL_TABLET | Freq: Three times a day (TID) | ORAL | 0 refills | Status: DC | PRN
Start: 2024-02-16 — End: 2024-04-05

## 2024-02-16 MED ORDER — TOPIRAMATE 100 MG PO TABS: 100.0000 mg | ORAL_TABLET | Freq: Every day | ORAL | 0 refills | Status: DC

## 2024-02-16 MED ORDER — UBRELVY 50 MG PO TABS: ORAL_TABLET | ORAL | 0 refills | Status: DC

## 2024-02-16 MED ORDER — METHYLPREDNISOLONE ACETATE 80 MG/ML IJ SUSP
80.0000 mg | Freq: Once | INTRAMUSCULAR | Status: AC
  Administered 2024-02-16: 80 mg via INTRAMUSCULAR

## 2024-02-16 NOTE — Patient Instructions (Addendum)
 Stop Maxalt  for migraines. Start [place] as needed for migraines.  Take 1 tablet by mouth at migraine onset, may take second tablet 2 hours later if migraine persists.  Stop taking methocarbamol muscle relaxer. Start taking cyclobenzaprine  muscle relaxer 3 times daily as needed for neck pain/migraine.  We increased the dose of your topiramate  (Topamax ) to 100 mg at bedtime.  You will either be contacted via phone regarding your referral to neurology, or you may receive a letter on your MyChart portal from our referral team with instructions for scheduling an appointment. Please let us  know if you have not been contacted by anyone within two weeks.  It was a pleasure to see you today!

## 2024-02-16 NOTE — Assessment & Plan Note (Addendum)
 With recent ED visit.  ED notes and labs reviewed from May 2025 through Care Everywhere.  Given minimal response to numerous medications including her prescribed occasions, we will change her regimen.  She has now failed Maxalt  and Imitrex, start Ubrelvy 50 mg.  Take 1 tablet by mouth at migraine onset, repeat with second tablet 2 hours later if needed.  Consider Nurtec if Florette Hurry is not covered by insurance.  Stop methocarbamol 500 mg muscle relaxer.  Start cyclobenzaprine  10 mg 3 times daily as needed.  Drowsiness precautions provided. Increase topiramate  to 100 mg at bedtime.  Referral placed to neurology.  Depo-Medrol  80 mg intramuscularly provided today.  She will update.

## 2024-02-16 NOTE — Progress Notes (Signed)
 Subjective:    Patient ID: Christine Cook, female    DOB: 02-18-1976, 48 y.o.   MRN: [id number]  HPI  Christine Cook is a very pleasant 48 y.o. female with a history of hypertension, migraines, OSA, type 2 diabetes, chronic pancreatitis who presents today for ED follow-up.  She presented to [facility] ED on 02/08/2024 for a 3-day history of intractable migraine that has not resolved with Maxalt  prescription at home.  During her stay in the ED she was noted to have elevated anion gap of 17 with hyperglycemia at 244.  She was treated with IV fluids, magnesium , lorazepam , Tylenol  with continued headaches.  She was provided with IV diazepam which helped to improve her headache so she was discharged home later that evening with a prescription for oxycodone.  Since her discharge home her migraine waxes and wanes. Yesterday was "horrible" and affected the vision to her left eye which is not usual. Her vision was unfocused and blurry, hard to focus. She took two Maxalt  tabs, Advil  Allergy & Sinus, methocarbamol, Tramadol , 2 Zofran  tabs, 2 Excedrin migraines, 2 Advil , 2 Tylenol . The Maxalt  helped, moreso the second dose, but she continues to experience her migraine now.  Her current headache is located to the bilateral occipital lobes and right frontal lobes. Her headache now feels dull and with tension. She is managed on Topamax  50 mg daily for headache prevention which has helped some. She currently has nausea, photophobia, phonophobia. She last vomited yesterday.   She does not see neurology. Over the last 1 month she's noticed increased lower neck pain. The methocarbamol is not working. She does have a follow up appointment scheduled with the spine doctor. She had terrible side effects from Imitrex. Maxalt  helps but does not abort her migraine. She has not tried [place].   Review of Systems  Eyes:  Positive for photophobia and visual disturbance.  Gastrointestinal:  Positive for nausea.   Musculoskeletal:  Positive for neck pain.  Neurological:  Positive for headaches.         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   Sleep apnea with hypersomnolence    CPAP study 11-28-09,  10-02-2009 AHI was 16.5, pressure to    Tachycardia     Social History   Socioeconomic History   Marital status: Single    Spouse name: Not on file   Number of children: 1   Years of education: College   Highest education level: Not on file  Occupational History   Occupation: Support Chief Executive Officer: [employer]  Tobacco Use   Smoking status: Former    Current packs/day: 0.00    Average packs/day: 0.3 packs/day for 2.0 years (0.5 ttl pk-yrs)    Types: Cigarettes    Start date: 2015    Quit date: 2017    Years since quitting: 8.3   Smokeless tobacco: Never  Vaping Use   Vaping status: Never Used  Substance and Sexual Activity   Alcohol use:  No   Drug use: No   Sexual activity: Yes    Birth control/protection: I.U.D.  Other Topics Concern   Not on file  Social History Narrative   Single.   One daughter.   Works at [employer].   Enjoys relaxing, spending time with family.   Social Drivers of Corporate investment banker Strain: Low Risk  (12/22/2023)   Received from [facility]   Overall Financial Resource Strain (CARDIA)    Difficulty of Paying Living Expenses: Not hard at all  Food Insecurity: No Food Insecurity (12/22/2023)   Received from [facility]   Hunger Vital Sign    Worried About Running Out of Food in the Last Year: Never  true    Ran Out of Food in the Last Year: Never true  Transportation Needs: No Transportation Needs (12/22/2023)   Received from [facility]    In the past 12 months, has lack of transportation kept you from medical appointments or from getting medications?: No    Lack of Transportation (Non-Medical): No  Physical Activity: Not on file  Stress: Not on file  Social Connections: Not on file  Intimate Partner Violence: Not on file    Past Surgical History:  Procedure Laterality Date   ADENOIDECTOMY     BLADDER SURGERY     CHOLECYSTECTOMY     KNEE SURGERY     Left knee x 2    LEEP  10/2016   CIN 1   TONSILLECTOMY      Family History  Problem Relation Age of Onset   Asthma Mother    Hyperthyroidism Mother    Rheum arthritis Mother    Graves' disease Mother    Sjogren's syndrome Mother    Migraines Mother    Asthma Daughter    Migraines Daughter    Colon cancer Neg Hx     Allergies  Allergen Reactions   Gabapentin Other (See Comments)    syncope  Syncope   Codeine  Hives and Nausea And Vomiting   Other Itching   Morphine Hives and Itching    Current Outpatient Medications on File Prior to Visit  Medication Sig Dispense Refill   Asenapine Maleate 10 MG SUBL Place 1 tablet under the tongue daily.     BD PEN NEEDLE MICRO U/F 32G X 6 MM MISC USE AS DIRECTED 5 TIMES DAILY WITH INSULIN  PEN 400 each 0   blood glucose meter kit and supplies KIT Dispense based on patient and insurance preference. Use up to four times daily as directed. (FOR ICD-9 250.00, 250.01). 1 each 0   celecoxib (CELEBREX) 200 MG capsule Take 200 mg by mouth 2 (two) times daily.     citalopram (CELEXA) 40 MG tablet Take 40 mg by mouth daily.     Continuous Blood Gluc Sensor (FREESTYLE LIBRE 3 SENSOR) MISC Place 1 sensor on the skin every 14 days. Use to check glucose continuously 6 each 1   diphenhydramine -acetaminophen  (TYLENOL  PM) 25-500 MG TABS tablet Take 2  tablets by mouth at bedtime.     gabapentin (NEURONTIN) 300 MG capsule Take 300 mg by mouth 3 (three) times daily.     glucose blood (ONETOUCH ULTRA) test strip USE TO CHECK BLOOD SUGAR UP TO FOUR TIMES DAILY AS DIRECTED 400 each 0   insulin  regular human CONCENTRATED (HUMULIN R ) 500 UNIT/ML KwikPen Inject 65 units before breakfast, 65 units before lunch and 60 units before dinner plus  sliding scale as directed     Lancets (ONETOUCH DELICA PLUS LANCET33G) MISC      levonorgestrel (MIRENA) 20 MCG/24HR IUD 1 each by Intrauterine route once.     metFORMIN  (GLUCOPHAGE -XR) 500 MG 24 hr tablet Take 1 tablet (500 mg total) by mouth daily with breakfast. For diabetes. 90 tablet 1   metoprolol  tartrate (LOPRESSOR ) 25 MG tablet TAKE 1 TABLET BY MOUTH TWICE DAILY FOR HEART RATE. 180 tablet 2   nystatin -triamcinolone  (MYCOLOG II) cream Apply 1 Application topically 2 (two) times daily as needed. 15 g 0   pantoprazole  (PROTONIX ) 40 MG tablet TAKE 1 TABLET BY MOUTH TWICE DAILY BEFORE A MEAL FOR HEARTBURN 180 tablet 2   pravastatin  (PRAVACHOL ) 80 MG tablet TAKE 1 TABLET BY MOUTH ONCE DAILY FOR CHOLESTEROL 90 tablet 2   traMADol  (ULTRAM ) 50 MG tablet Take 50 mg by mouth.     venlafaxine  XR (EFFEXOR -XR) 150 MG 24 hr capsule Take 2 capsules (300 mg total) by mouth daily with breakfast. 180 capsule 0   butalbital -acetaminophen -caffeine  (FIORICET) 50-325-40 MG tablet Take 1-2 tablets by mouth every 6 (six) hours as needed for headache. No more than 6 tablets in a 24-hour period. (Patient not taking: Reported on 02/16/2024) 20 tablet 0   [DISCONTINUED] atorvastatin  (LIPITOR) 80 MG tablet Take 1 tablet by mouth every evening for cholesterol. 90 tablet 2   [DISCONTINUED] diphenhydrAMINE  (BENADRYL ) 25 mg capsule Take 2 capsules (50 mg total) by mouth every 6 (six) hours as needed. 60 capsule 0   [DISCONTINUED] metoCLOPramide  (REGLAN ) 10 MG tablet Take 1 tablet (10 mg total) by mouth every 6 (six) hours as needed. 30 tablet 0    [DISCONTINUED] potassium chloride  SA (K-DUR) 20 MEQ tablet Take 1 tablet by mouth once daily 90 tablet 1   [DISCONTINUED] QUEtiapine  (SEROQUEL  XR) 50 MG TB24 24 hr tablet Take 1 tablet (50 mg total) by mouth at bedtime.     No current facility-administered medications on file prior to visit.    BP 132/84   Pulse 94   Temp 97.7 F (36.5 C) (Temporal)   Ht 5\' 4"  (1.626 m)   Wt 180 lb (81.6 kg)   SpO2 98%   BMI 30.90 kg/m  Objective:   Physical Exam Eyes:     Extraocular Movements: Extraocular movements intact.  Cardiovascular:     Rate and Rhythm: Normal rate and regular rhythm.  Pulmonary:     Effort: Pulmonary effort is normal.  Musculoskeletal:     Cervical back: Neck supple.  Skin:    General: Skin is warm and dry.  Neurological:     Mental Status: She is alert and oriented to person, place, and time.     Cranial Nerves: No cranial nerve deficit.     Coordination: Coordination normal.  Psychiatric:        Mood and Affect: Mood normal.           Assessment & Plan:  Intractable migraine without aura and without status migrainosus Assessment & Plan: With recent ED visit.  ED notes and labs reviewed from May 2025 through Care Everywhere.  Given minimal response to numerous medications including her prescribed occasions, we will change her regimen.  She has now failed Maxalt  and Imitrex, start Ubrelvy 50 mg.  Take 1 tablet by mouth at migraine onset, repeat with second tablet 2 hours later if needed.  Consider Nurtec if Florette Hurry is not covered by insurance.  Stop methocarbamol 500 mg muscle relaxer.  Start cyclobenzaprine  10 mg  3 times daily as needed.  Drowsiness precautions provided. Increase topiramate  to 100 mg at bedtime.  Referral placed to neurology.  Depo-Medrol  80 mg intramuscularly provided today.  She will update.  Orders: -     Topiramate ; Take 1 tablet (100 mg total) by mouth at bedtime. For headache prevention  Dispense: 90 tablet; Refill: 0 -      Ubrelvy; Take 1 tablet by mouth at migraine onset. May take 1 more tablet 2 hours later if migraine persists.  Dispense: 30 tablet; Refill: 0 -     Cyclobenzaprine  HCl; Take 1 tablet (10 mg total) by mouth 3 (three) times daily as needed for muscle spasms.  Dispense: 30 tablet; Refill: 0 -     Ambulatory referral to Neurology -     methylPREDNISolone  Acetate        Tatsuo Musial K Hilma Steinhilber, NP

## 2024-02-16 NOTE — Telephone Encounter (Signed)
 Pharmacy Patient Advocate Encounter   Received notification from Patient Pharmacy that prior authorization for Ubrelvy 50 is required/requested.   Insurance verification completed.   The patient is insured through [facility] .   Per test claim: PA required; PA submitted to above mentioned insurance via CoverMyMeds Key/confirmation #/EOC [id number] Status is pending

## 2024-02-17 NOTE — Telephone Encounter (Signed)
 Pharmacy Patient Advocate Encounter  Received notification from [facility] that Prior Authorization for UBRELVY has been DENIED.  Full denial letter will be uploaded to the media tab. See denial reason below.   PA #/Case ID/Reference #: [id number]

## 2024-02-24 ENCOUNTER — Other Ambulatory Visit (HOSPITAL_COMMUNITY): Payer: Self-pay

## 2024-02-24 ENCOUNTER — Telehealth: Payer: Self-pay

## 2024-02-24 NOTE — Telephone Encounter (Signed)
 Pharmacy Patient Advocate Encounter   Received notification from Pt Calls Messages that prior authorization for UBRELVY  is required/requested.   Insurance verification completed.   The patient is insured through [facility] .   Per test claim: PA required; PA submitted to above mentioned insurance via CoverMyMeds Key/confirmation #/EOC [id number] Status is pending

## 2024-02-24 NOTE — Telephone Encounter (Signed)
 PA request has been RESUBMITTED. New Encounter has been or will be created for follow up. For additional info see Pharmacy Prior Auth telephone encounter from 02/24/24.

## 2024-02-24 NOTE — Telephone Encounter (Signed)
 Noted. Patient has failed both Maxalt  and Imitrex. Does she not qualify for a different class of medication such as Ubrelvy ? I mentioned all of this in my office notes dated 02/16/24.

## 2024-02-24 NOTE — Telephone Encounter (Signed)
 Pharmacy Patient Advocate Encounter  Received notification from [facility] that Prior Authorization for UBRELVY   has been APPROVED from 02/24/24 to 02/23/25. Ran test claim, Copay is $0. This test claim was processed through [facility]- copay amounts may vary at other pharmacies due to pharmacy/plan contracts, or as the patient moves through the different stages of their insurance plan.   PA #/Case ID/Reference #:  [id number]

## 2024-02-29 ENCOUNTER — Other Ambulatory Visit (HOSPITAL_COMMUNITY): Payer: Self-pay

## 2024-03-14 ENCOUNTER — Ambulatory Visit
Admission: RE | Admit: 2024-03-14 | Discharge: 2024-03-14 | Disposition: A | Discharge status: Home / Self Care | Source: Ambulatory Visit | Attending: Primary Care | Admitting: Primary Care

## 2024-03-14 DIAGNOSIS — Z1231 Encounter for screening mammogram for malignant neoplasm of breast: Secondary | ICD-10-CM | POA: Diagnosis present

## 2024-03-16 ENCOUNTER — Ambulatory Visit: Payer: Self-pay | Admitting: Primary Care

## 2024-04-05 ENCOUNTER — Ambulatory Visit: Admitting: Primary Care

## 2024-04-05 ENCOUNTER — Other Ambulatory Visit: Payer: Self-pay | Admitting: Primary Care

## 2024-04-05 ENCOUNTER — Ambulatory Visit: Payer: Self-pay | Admitting: Primary Care

## 2024-04-05 ENCOUNTER — Encounter: Payer: Self-pay | Admitting: Primary Care

## 2024-04-05 VITALS — BP 122/84 | HR 80 | Temp 97.3°F | Ht 64.0 in | Wt 187.0 lb

## 2024-04-05 DIAGNOSIS — G4733 Obstructive sleep apnea (adult) (pediatric): Secondary | ICD-10-CM

## 2024-04-05 DIAGNOSIS — F411 Generalized anxiety disorder: Secondary | ICD-10-CM

## 2024-04-05 DIAGNOSIS — Z0000 Encounter for general adult medical examination without abnormal findings: Secondary | ICD-10-CM

## 2024-04-05 DIAGNOSIS — R000 Tachycardia, unspecified: Secondary | ICD-10-CM

## 2024-04-05 DIAGNOSIS — E785 Hyperlipidemia, unspecified: Secondary | ICD-10-CM

## 2024-04-05 DIAGNOSIS — I10 Essential (primary) hypertension: Secondary | ICD-10-CM

## 2024-04-05 DIAGNOSIS — E538 Deficiency of other specified B group vitamins: Secondary | ICD-10-CM

## 2024-04-05 DIAGNOSIS — E559 Vitamin D deficiency, unspecified: Secondary | ICD-10-CM

## 2024-04-05 DIAGNOSIS — F330 Major depressive disorder, recurrent, mild: Secondary | ICD-10-CM

## 2024-04-05 DIAGNOSIS — K861 Other chronic pancreatitis: Secondary | ICD-10-CM

## 2024-04-05 DIAGNOSIS — E1165 Type 2 diabetes mellitus with hyperglycemia: Secondary | ICD-10-CM | POA: Diagnosis not present

## 2024-04-05 DIAGNOSIS — Z794 Long term (current) use of insulin: Secondary | ICD-10-CM | POA: Diagnosis not present

## 2024-04-05 DIAGNOSIS — G43009 Migraine without aura, not intractable, without status migrainosus: Secondary | ICD-10-CM

## 2024-04-05 DIAGNOSIS — G43019 Migraine without aura, intractable, without status migrainosus: Secondary | ICD-10-CM

## 2024-04-05 DIAGNOSIS — K219 Gastro-esophageal reflux disease without esophagitis: Secondary | ICD-10-CM

## 2024-04-05 LAB — LIPID PANEL
Cholesterol: 256 mg/dL — ABNORMAL HIGH (ref 0–200)
HDL: 34.5 mg/dL — ABNORMAL LOW (ref >39.00)
NonHDL: 221.02
Total CHOL/HDL Ratio: 7
Triglycerides: 460 mg/dL — ABNORMAL HIGH (ref 0.0–149.0)
VLDL: 92 mg/dL — ABNORMAL HIGH (ref 0.0–40.0)

## 2024-04-05 LAB — LDL CHOLESTEROL, DIRECT: Direct LDL: 152 mg/dL

## 2024-04-05 LAB — MICROALBUMIN / CREATININE URINE RATIO
Creatinine,U: 122.7 mg/dL
Microalb Creat Ratio: 6 mg/g (ref 0.0–30.0)
Microalb, Ur: 0.7 mg/dL (ref 0.0–1.9)

## 2024-04-05 NOTE — Assessment & Plan Note (Signed)
 Continue 50,000 international units  capsules weekly until complete, then proceed with 1000 international units  daily thereafter.  Reviewed labs from Care everywhere in May 2025.

## 2024-04-05 NOTE — Assessment & Plan Note (Signed)
 Improved.  Continue pantoprazole  40 mg BID.

## 2024-04-05 NOTE — Assessment & Plan Note (Signed)
 Stable, managing well with diet.  Following with GI, reviewed GI notes from June 2025

## 2024-04-05 NOTE — Assessment & Plan Note (Signed)
Controlled.  Continue metoprolol succinate 25 mg daily. 

## 2024-04-05 NOTE — Assessment & Plan Note (Signed)
 Controlled.  Following with Psychiatry.  Continue Asenapine 15 mg daily, citalopram 40 mg daily, venlafaxine  300 mg daily.

## 2024-04-05 NOTE — Assessment & Plan Note (Signed)
 Continue B12 1000 mcg daily Reviewed B12 level through Care Everywhere from May 2025.

## 2024-04-05 NOTE — Assessment & Plan Note (Signed)
 Uncontrolled.  Following with endocrinology, office notes reviewed from April 2025.  Continue U-500 insulin  80 units TID with meals and metformin  Er 2000 daily. Urine microalbumin due and pending.

## 2024-04-05 NOTE — Assessment & Plan Note (Addendum)
 Chronic and ongoing.   Following with neurology, office notes reviewed from June 2024.  Continue Emgality once monthly, Topamax  100 mg BID, Ubrelvy  50 mg daily. Continue cyclobenzaprine  5 mg PRN.

## 2024-04-05 NOTE — Progress Notes (Signed)
 Subjective:    Patient ID: Christine Cook, female    DOB: 07-30-1976, 48 y.o.   MRN: [id number]  HPI  Christine Cook is a very pleasant 48 y.o. female who presents today for complete physical and follow up of chronic conditions.  Immunizations: -Tetanus: Completed in 2024 -Pneumonia: Completed 2019  Diet: Fair diet.  Exercise: No regular exercise.  Eye exam: Completes annually  Dental exam: Completed years ago   Pap Smear: Completed per GYN Mammogram: Completed in June 2025  Colonoscopy: Never completed, declined last year. She is following with GI and will schedule.   BP Readings from Last 3 Encounters:  04/05/24 122/84  02/16/24 132/84  11/04/23 138/82       Review of Systems  Constitutional:  Negative for unexpected weight change.  HENT:  Negative for rhinorrhea.   Respiratory:  Negative for cough and shortness of breath.   Cardiovascular:  Negative for chest pain.  Gastrointestinal:  Negative for constipation and diarrhea.  Genitourinary:  Negative for difficulty urinating.  Musculoskeletal:  Negative for arthralgias and myalgias.  Skin:  Negative for rash.  Allergic/Immunologic: Negative for environmental allergies.  Neurological:  Positive for headaches. Negative for dizziness and numbness.  Psychiatric/Behavioral:  The patient is not nervous/anxious.          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   Sleep apnea with  hypersomnolence    CPAP study 11-28-09,  10-02-2009 AHI was 16.5, pressure to    Tachycardia     Social History   Socioeconomic History   Marital status: Single    Spouse name: Not on file   Number of children: 1   Years of education: College   Highest education level: Not on file  Occupational History   Occupation: Support Chief Executive Officer: [employer]  Tobacco Use   Smoking status: Former    Current packs/day: 0.00    Average packs/day: 0.3 packs/day for 2.0 years (0.5 ttl pk-yrs)    Types: Cigarettes    Start date: 2015    Quit date: 2017    Years since quitting: 8.5   Smokeless tobacco: Never  Vaping Use   Vaping status: Never Used  Substance and Sexual Activity   Alcohol use: No   Drug use: No   Sexual activity: Yes    Birth control/protection: I.U.D.  Other Topics Concern   Not on file  Social History Narrative   Single.   One daughter.   Works at [employer].   Enjoys relaxing, spending time with family.   Social Drivers of Corporate investment banker Strain: Low Risk  (03/14/2024)   Received from [facility]   Overall Financial Resource Strain (CARDIA)    Difficulty of Paying Living Expenses: Not hard at all  Food Insecurity: No Food Insecurity (03/14/2024)   Received from [facility]  Hunger Vital Sign    Within the past 12 months, you worried that your food would run out before you got the money to buy more.: Never true    Within the past 12 months, the food you bought just didn't last and you didn't have money to get more.: Never true  Transportation Needs: No Transportation Needs (03/14/2024)   Received from [facility] - Transportation    In the past 12 months, has lack of transportation kept you from medical appointments or from getting medications?: No    Lack of Transportation (Non-Medical): No  Physical Activity: Not on file  Stress: Not on file  Social Connections: Not on  file  Intimate Partner Violence: Not on file    Past Surgical History:  Procedure Laterality Date   ADENOIDECTOMY     BLADDER SURGERY     CHOLECYSTECTOMY     KNEE SURGERY     Left knee x 2    LEEP  10/2016   CIN 1   TONSILLECTOMY      Family History  Problem Relation Age of Onset   Asthma Mother    Hyperthyroidism Mother    Rheum arthritis Mother    Graves' disease Mother    Sjogren's syndrome Mother    Migraines Mother    Asthma Daughter    Migraines Daughter    Colon cancer Neg Hx     Allergies  Allergen Reactions   Gabapentin Other (See Comments)    syncope  Syncope   Codeine  Hives and Nausea And Vomiting   Other Itching   Morphine Hives and Itching    Current Outpatient Medications on File Prior to Visit  Medication Sig Dispense Refill   asenapine (SAPHRIS) 5 MG SUBL 24 hr tablet Place 5 mg under the tongue daily.     Asenapine Maleate 10 MG SUBL Place 10 mg under the tongue daily.     BD PEN NEEDLE MICRO U/F 32G X 6 MM MISC USE AS DIRECTED 5 TIMES DAILY WITH INSULIN  PEN 400 each 0   blood glucose meter kit and supplies KIT Dispense based on patient and insurance preference. Use up to four times daily as directed. (FOR ICD-9 250.00, 250.01). 1 each 0   celecoxib (CELEBREX) 200 MG capsule Take 200 mg by mouth 2 (two) times daily.     citalopram (CELEXA) 40 MG tablet Take 40 mg by mouth daily.     cyclobenzaprine  (FLEXERIL ) 10 MG tablet Take 1 tablet (10 mg total) by mouth 3 (three) times daily as needed for muscle spasms. 30 tablet 0   EMGALITY 120 MG/ML SOAJ SMARTSIG:120 Milligram(s) SUB-Q Once a Month     gabapentin (NEURONTIN) 300 MG capsule Take 300 mg by mouth 3 (three) times daily.     glucose blood (ONETOUCH ULTRA) test strip USE TO CHECK BLOOD SUGAR UP TO FOUR TIMES DAILY AS DIRECTED 400 each 0   insulin  regular human CONCENTRATED (HUMULIN R ) 500 UNIT/ML KwikPen Inject 65 units before breakfast, 65 units before lunch and 60 units before dinner plus  sliding scale as directed     Lancets (ONETOUCH DELICA PLUS LANCET33G) MISC      levonorgestrel (MIRENA) 20 MCG/24HR IUD 1 each by Intrauterine route once.     MAGNESIUM  GLYCINATE PO Take 600 mg by mouth.     metFORMIN  (GLUCOPHAGE -XR) 500 MG 24 hr tablet Take 1 tablet (500 mg total) by mouth daily with breakfast. For diabetes. 90 tablet 1   metoprolol   tartrate (LOPRESSOR ) 25 MG tablet TAKE 1 TABLET BY MOUTH TWICE DAILY FOR HEART RATE. 180 tablet 2   pantoprazole  (PROTONIX ) 40 MG tablet TAKE 1 TABLET BY MOUTH TWICE DAILY BEFORE A MEAL FOR HEARTBURN 180 tablet 2   pravastatin  (PRAVACHOL ) 80 MG tablet TAKE 1 TABLET BY MOUTH ONCE DAILY FOR CHOLESTEROL 90 tablet 2   Riboflavin 400 MG TABS Take 400 mg by mouth.     topiramate  (TOPAMAX ) 100 MG tablet Take 1 tablet (100 mg total) by mouth at bedtime. For headache prevention 90 tablet 0   traMADol  (ULTRAM ) 50 MG tablet Take 50 mg by mouth.     Ubrogepant  (UBRELVY ) 50 MG TABS Take 1 tablet by mouth at migraine onset. May take 1 more tablet 2 hours later if migraine persists. 30 tablet 0   venlafaxine  XR (EFFEXOR -XR) 150 MG 24 hr capsule Take 2 capsules (300 mg total) by mouth daily with breakfast. 180 capsule 0   Continuous Blood Gluc Sensor (FREESTYLE LIBRE 3 SENSOR) MISC Place 1 sensor on the skin every 14 days. Use to check glucose continuously (Patient not taking: Reported on 04/05/2024) 6 each 1   [DISCONTINUED] atorvastatin  (LIPITOR) 80 MG tablet Take 1 tablet by mouth every evening for cholesterol. 90 tablet 2   [DISCONTINUED] diphenhydrAMINE  (BENADRYL ) 25 mg capsule Take 2 capsules (50 mg total) by mouth every 6 (six) hours as needed. 60 capsule 0   [DISCONTINUED] metoCLOPramide  (REGLAN ) 10 MG tablet Take 1 tablet (10 mg total) by mouth every 6 (six) hours as needed. 30 tablet 0   [DISCONTINUED] potassium chloride  SA (K-DUR) 20 MEQ tablet Take 1 tablet by mouth once daily 90 tablet 1   [DISCONTINUED] QUEtiapine  (SEROQUEL  XR) 50 MG TB24 24 hr tablet  Take 1 tablet (50 mg total) by mouth at bedtime.     No current facility-administered medications on file prior to visit.    BP 122/84   Pulse 80   Temp (!) 97.3 F (36.3 C) (Temporal)   Ht 5' 4 (1.626 m)   Wt 187 lb (84.8 kg)   SpO2 96%   BMI 32.10 kg/m  Objective:   Physical Exam HENT:     Right Ear: Tympanic membrane and ear canal normal.     Left Ear: Tympanic membrane and ear canal normal.  Eyes:     Pupils: Pupils are equal, round, and reactive to light.  Cardiovascular:     Rate and Rhythm: Normal rate and regular rhythm.  Pulmonary:     Effort: Pulmonary effort is normal.     Breath sounds: Normal breath sounds.  Abdominal:     General: Bowel sounds are normal.     Palpations: Abdomen is soft.     Tenderness: There is no abdominal tenderness.  Musculoskeletal:        General: Normal range of motion.     Cervical back: Neck supple.  Skin:    General: Skin is warm and dry.  Neurological:     Mental Status: She is alert and oriented to person, place, and time.     Cranial Nerves: No cranial nerve deficit.     Deep Tendon Reflexes:     Reflex Scores:      Patellar reflexes are 2+ on the right side and 2+ on the left side. Psychiatric:        Mood and Affect: Mood normal.           Assessment & Plan:  Preventative health care Assessment & Plan: Immunizations UTD.  Pap smear UTD.  Follows with GYN Mammogram up-to-date Colonoscopy overdue, she will schedule with GI  Discussed the importance of a healthy diet and regular exercise in order for weight loss, and to reduce the risk of further co-morbidity.  Exam stable. Labs pending.  Follow up in 1 year for repeat physical.    Primary hypertension Assessment & Plan: Controlled.  Continue metoprolol  succinate 25 mg daily.    Migraine without aura and without status migrainosus, not intractable Assessment & Plan: Chronic and ongoing.   Following with neurology, office notes reviewed from June  2024.  Continue Emgality once monthly, Topamax  100 mg BID, Ubrelvy  50 mg daily. Continue cyclobenzaprine  5 mg PRN.   OSA (obstructive sleep apnea) Assessment & Plan: Following with pulmonology.  Was determined not to need a CPAP.   Chronic pancreatitis, unspecified pancreatitis type [facility]) Assessment & Plan: Stable, managing well with diet.  Following with GI, reviewed GI notes from June 2025   Gastroesophageal reflux disease without esophagitis Assessment & Plan: Improved.  Continue pantoprazole  40 mg BID.    Type 2 diabetes mellitus with hyperglycemia, with long-term current use of insulin  (HCC) Assessment & Plan: Uncontrolled.  Following with endocrinology, office notes reviewed from April 2025.  Continue U-500 insulin  80 units TID with meals and metformin  Er 2000 daily. Urine microalbumin due and pending.  Orders: -     Microalbumin / creatinine urine ratio  GAD (generalized anxiety disorder) Assessment & Plan: Controlled.  Following with Psychiatry.  Continue Asenapine 15 mg daily, citalopram 40 mg daily, venlafaxine  300 mg daily.   Tachycardia Assessment & Plan: Controlled.  Continue metoprolol  succinate 25 mg daily.   Mild episode of recurrent major depressive disorder [facility]) Assessment & Plan: Controlled.  Following with Psychiatry.  Continue Asenapine 15 mg daily, citalopram 40 mg daily, venlafaxine  300 mg daily.   Hyperlipidemia, unspecified hyperlipidemia type Assessment & Plan: Repeat lipid panel pending today.  Continue pravastatin  80 mg three times weekly.   Orders: -     Lipid panel  Vitamin D  deficiency Assessment & Plan: Continue 50,000 international units  capsules weekly until complete, then proceed with 1000 international units  daily thereafter.  Reviewed labs from Care everywhere in May 2025.   Vitamin B12 deficiency Assessment & Plan: Continue B12 1000 mcg daily Reviewed B12 level through Care Everywhere from May  2025.         Rozalia Dino K Earlin Sweeden, NP

## 2024-04-05 NOTE — Assessment & Plan Note (Signed)
 Following with pulmonology.  Was determined not to need a CPAP.

## 2024-04-05 NOTE — Assessment & Plan Note (Signed)
 Repeat lipid panel pending today.  Continue pravastatin  80 mg three times weekly.

## 2024-04-05 NOTE — Patient Instructions (Signed)
 Stop by the lab prior to leaving today. I will notify you of your results once received.   It was a pleasure to see you today!

## 2024-04-05 NOTE — Assessment & Plan Note (Signed)
 Immunizations UTD. Pap smear UTD.  Follows with GYN Mammogram up-to-date Colonoscopy overdue, she will schedule with GI  Discussed the importance of a healthy diet and regular exercise in order for weight loss, and to reduce the risk of further co-morbidity.  Exam stable. Labs pending.  Follow up in 1 year for repeat physical.

## 2024-04-13 ENCOUNTER — Ambulatory Visit: Admitting: Family Medicine

## 2024-04-13 ENCOUNTER — Encounter: Payer: Self-pay | Admitting: Family Medicine

## 2024-04-13 VITALS — BP 100/60 | HR 89 | Temp 98.4°F | Ht 64.0 in | Wt 189.0 lb

## 2024-04-13 DIAGNOSIS — N764 Abscess of vulva: Secondary | ICD-10-CM

## 2024-04-13 MED ORDER — DOXYCYCLINE HYCLATE 100 MG PO TABS: 100.0000 mg | ORAL_TABLET | Freq: Two times a day (BID) | ORAL | 0 refills | Status: DC

## 2024-04-13 NOTE — Progress Notes (Unsigned)
 Patient ID: Christine Cook, female    DOB: [birth year], 48 y.o.   MRN: [id number]  This visit was conducted in person.  BP 100/60   Pulse 89   Temp 98.4 F (36.9 C) (Temporal)   Ht 5' 4 (1.626 m)   Wt 189 lb (85.7 kg)   SpO2 97%   BMI 32.44 kg/m    CC:  Chief Complaint  Patient presents with  . Cyst    On Left Crease Groin Area Size of grape    Subjective:   HPI: Christine Cook is a 48 y.o. female  patient of Mallie Gaskins with history of  HTN. DM 2presenting on 04/13/2024 for Cyst (On Left Crease Groin Area/Size of grape)   New onset skin lesion on left leg crease/groin .SABRA First noted 5-6 days ago soreness in left groin. Occurred after shaving in that area. Area has slowly increased in size now size of grape.  No discharge.  Has tried to soak in tub, has not applied anything.  She has had issue similar in past... few months ago similar... same location... at that point did not treat in anyway.  Went down on it own.   Poorly controlled DM.  FBS high.. has appt with ENDO MON.  Last A1C 01/2024 9.2. Lab Results  Component Value Date   HGBA1C 8.4 08/22/2023    No fever, N/V. No flu like symptoms.     No MRSA, no recurrent boils in family.  No recent hospitalizations.  Relevant past medical, surgical, family and social history reviewed and updated as indicated. Interim medical history since our last visit reviewed. Allergies and medications reviewed and updated. Outpatient Medications Prior to Visit  Medication Sig Dispense Refill  . Continuous Glucose Sensor (DEXCOM G7 SENSOR) MISC by Does not apply route.    . ondansetron  (ZOFRAN -ODT) 8 MG disintegrating tablet Take 8 mg by mouth daily as needed.    . phentermine 15 MG capsule Take 15 mg by mouth daily.    SABRA asenapine (SAPHRIS) 5 MG SUBL 24 hr tablet Place 5 mg under the tongue daily.    . Asenapine Maleate 10 MG SUBL Place 10 mg under the tongue daily.    . BD PEN NEEDLE MICRO U/F 32G X 6 MM MISC USE AS DIRECTED  5 TIMES DAILY WITH INSULIN  PEN 400 each 0  . blood glucose meter kit and supplies KIT Dispense based on patient and insurance preference. Use up to four times daily as directed. (FOR ICD-9 250.00, 250.01). 1 each 0  . celecoxib (CELEBREX) 200 MG capsule Take 200 mg by mouth 2 (two) times daily.    . citalopram (CELEXA) 40 MG tablet Take 40 mg by mouth daily.    . cyclobenzaprine  (FLEXERIL ) 10 MG tablet Take 1 tablet (10 mg total) by mouth 3 (three) times daily as needed. for muscle spasms or migraines 30 tablet 0  . EMGALITY 120 MG/ML SOAJ SMARTSIG:120 Milligram(s) SUB-Q Once a Month    . gabapentin (NEURONTIN) 300 MG capsule Take 300 mg by mouth 3 (three) times daily.    SABRA glucose blood (ONETOUCH ULTRA) test strip USE TO CHECK BLOOD SUGAR UP TO FOUR TIMES DAILY AS DIRECTED 400 each 0  . insulin  regular human CONCENTRATED (HUMULIN R ) 500 UNIT/ML KwikPen Inject 65 units before breakfast, 65 units before lunch and 60 units before dinner plus sliding scale as directed    . Lancets (ONETOUCH DELICA PLUS LANCET33G) MISC     . levonorgestrel (MIRENA)  20 MCG/24HR IUD 1 each by Intrauterine route once.    . MAGNESIUM  GLYCINATE PO Take 600 mg by mouth.    . metFORMIN  (GLUCOPHAGE -XR) 500 MG 24 hr tablet Take 1 tablet (500 mg total) by mouth daily with breakfast. For diabetes. 90 tablet 1  . metoprolol  tartrate (LOPRESSOR ) 25 MG tablet TAKE 1 TABLET BY MOUTH TWICE DAILY FOR HEART RATE. 180 tablet 2  . pantoprazole  (PROTONIX ) 40 MG tablet TAKE 1 TABLET BY MOUTH TWICE DAILY BEFORE A MEAL FOR HEARTBURN 180 tablet 2  . pravastatin  (PRAVACHOL ) 80 MG tablet TAKE 1 TABLET BY MOUTH ONCE DAILY FOR CHOLESTEROL 90 tablet 2  . topiramate  (TOPAMAX ) 100 MG tablet Take 1 tablet (100 mg total) by mouth at bedtime. For headache prevention 90 tablet 0  . traMADol  (ULTRAM ) 50 MG tablet Take 50 mg by mouth.    . Ubrogepant  (UBRELVY ) 50 MG TABS Take 1 tablet by mouth at migraine onset. May take 1 more tablet 2 hours later if  migraine persists. 30 tablet 0  . venlafaxine  XR (EFFEXOR -XR) 150 MG 24 hr capsule Take 2 capsules (300 mg total) by mouth daily with breakfast. 180 capsule 0  . atorvastatin  (LIPITOR) 80 MG tablet Take 1 tablet by mouth every evening for cholesterol. 90 tablet 2  . Continuous Blood Gluc Sensor (FREESTYLE LIBRE 3 SENSOR) MISC Place 1 sensor on the skin every 14 days. Use to check glucose continuously (Patient not taking: Reported on 04/05/2024) 6 each 1  . diphenhydrAMINE  (BENADRYL ) 25 mg capsule Take 2 capsules (50 mg total) by mouth every 6 (six) hours as needed. 60 capsule 0  . metoCLOPramide  (REGLAN ) 10 MG tablet Take 1 tablet (10 mg total) by mouth every 6 (six) hours as needed. 30 tablet 0  . potassium chloride  SA (K-DUR) 20 MEQ tablet Take 1 tablet by mouth once daily 90 tablet 1  . QUEtiapine  (SEROQUEL  XR) 50 MG TB24 24 hr tablet Take 1 tablet (50 mg total) by mouth at bedtime.    . Riboflavin 400 MG TABS Take 400 mg by mouth.     No facility-administered medications prior to visit.     Per HPI unless specifically indicated in ROS section below Review of Systems Objective:  BP 100/60   Pulse 89   Temp 98.4 F (36.9 C) (Temporal)   Ht 5' 4 (1.626 m)   Wt 189 lb (85.7 kg)   SpO2 97%   BMI 32.44 kg/m   Wt Readings from Last 3 Encounters:  04/13/24 189 lb (85.7 kg)  04/05/24 187 lb (84.8 kg)  02/16/24 180 lb (81.6 kg)      Physical Exam    Results for orders placed or performed in visit on 04/05/24  Lipid panel   Collection Time: 04/05/24 10:58 AM  Result Value Ref Range   Cholesterol 256 (H) 0 - 200 mg/dL   Triglycerides (H) 0.0 - 149.0 mg/dL    539.9 Triglyceride is over 400; calculations on Lipids are invalid.   HDL 34.50 (L) >39.00 mg/dL   VLDL 07.9 (H) 0.0 - 59.9 mg/dL   Total CHOL/HDL Ratio 7    NonHDL 221.02   Microalbumin / creatinine urine ratio   Collection Time: 04/05/24 10:58 AM  Result Value Ref Range   Microalb, Ur 0.7 0.0 - 1.9 mg/dL   Creatinine,U  877.2 mg/dL   Microalb Creat Ratio 6.0 0.0 - 30.0 mg/g  LDL cholesterol, direct   Collection Time: 04/05/24 10:58 AM  Result Value Ref Range  Direct LDL 152.0 mg/dL    Assessment and Plan  There are no diagnoses linked to this encounter.  No follow-ups on file.   Greig Ring, MD

## 2024-04-16 LAB — WOUND CULTURE
MICRO NUMBER:: 16688095
SPECIMEN QUALITY:: ADEQUATE

## 2024-04-17 ENCOUNTER — Ambulatory Visit: Payer: Self-pay | Admitting: Family Medicine

## 2024-04-18 DIAGNOSIS — N764 Abscess of vulva: Secondary | ICD-10-CM | POA: Insufficient documentation

## 2024-04-18 NOTE — Assessment & Plan Note (Addendum)
 I&D  Meds, vitals, and allergies reviewed.   Indication: suspect abscess  Pt complaints of: erythema, pain, swelling  Location: left labia  Size: 4 cm diameter  Informed consent obtained.  Pt aware of risks not limited to but including infection, bleeding, damage to near by organs.  Prep: etoh/betadine  Anesthesia: 1%lidocaine with epi, good effect  Incision made with #11 blade  Wound explored and loculations removed  Wound packed with iodoform gauze  Tolerated well  Routine postprocedure instructions d/w pt- remove packing in 24-48h, keep area clean and bandaged, follow up if concerns/spreading erythema/pain.   Also recommended treatment with warm compresses 3-4 times a day.  Start doxycycline  100 mg p.o. twice daily x 10 days. Wound culture sent.  Return and ER precautions provided.

## 2024-04-19 ENCOUNTER — Ambulatory Visit (INDEPENDENT_AMBULATORY_CARE_PROVIDER_SITE_OTHER): Admitting: Primary Care

## 2024-04-19 ENCOUNTER — Encounter: Payer: Self-pay | Admitting: Primary Care

## 2024-04-19 VITALS — BP 106/64 | HR 91 | Temp 97.2°F | Ht 64.0 in | Wt 188.0 lb

## 2024-04-19 DIAGNOSIS — N764 Abscess of vulva: Secondary | ICD-10-CM

## 2024-04-19 NOTE — Assessment & Plan Note (Signed)
 Improved, nearly healed.  Continue doxycycline  100 mg twice daily to complete. We discussed alternatives for hair removal outside of shaving.  Return precautions provided.

## 2024-04-19 NOTE — Patient Instructions (Signed)
 Continue doxycycline  antibiotics as prescribed.  It was a pleasure to see you today!

## 2024-04-19 NOTE — Progress Notes (Signed)
 Subjective:    Patient ID: Christine Cook, female    DOB: 1976/03/01, 48 y.o.   MRN: [id number]  Abscess Pertinent negatives include no fever.    Christine Cook is a very pleasant 47 y.o. female with a history of uncontrolled type 2 diabetes, chronic pancreatitis, OSA, hyperlipidemia, genital labial abscess who presents today for follow-up of labial abscess.  She was last evaluated on 04/13/2024 by Dr. Avelina for a 5 to 6-day history of soreness to the left groin after shaving.  Shortly after she noticed the skin mass with discomfort and without discharge.  During this visit the abscess was drained and packed with instructions to remove packing in 24 to 48 hours.  She was also treated with doxycycline  100 mg twice daily x 10 days.  Wound culture with gram-positive bacilli.  Since her visit on 04/13/2024 she is compliant to doxycycline  twice daily. Her labial abscess has improved, less swollen, less painful. The site is draining a red/clear color. She removed the packing a few days ago. She denies fevers.    Review of Systems  Constitutional:  Negative for fever.  Skin:  Positive for wound. Negative for color change.         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   Sleep apnea with hypersomnolence    CPAP study 11-28-09,  10-02-2009 AHI was 16.5, pressure to    Tachycardia     Social History   Socioeconomic History   Marital status: Single    Spouse name:  Not on file   Number of children: 1   Years of education: College   Highest education level: Not on file  Occupational History   Occupation: Support Chief Executive Officer: [employer]  Tobacco Use   Smoking status: Former    Current packs/day: 0.00    Average packs/day: 0.3 packs/day for 2.0 years (0.5 ttl pk-yrs)    Types: Cigarettes    Start date: 2015    Quit date: 2017    Years since quitting: 8.5   Smokeless tobacco: Never  Vaping Use   Vaping status: Never Used  Substance and Sexual Activity   Alcohol use: No   Drug use: No   Sexual activity: Yes    Birth control/protection: I.U.D.  Other Topics Concern   Not on file  Social History Narrative   Single.   One daughter.   Works at [employer].   Enjoys relaxing, spending time with family.   Social Drivers of Corporate investment banker Strain: Low Risk  (04/16/2024)   Received from [facility]   Overall Financial Resource Strain (CARDIA)    Difficulty of Paying Living Expenses: Not hard at all  Food Insecurity: No Food Insecurity (04/16/2024)   Received from [facility]   Hunger Vital Sign    Within the past 12 months, you worried  that your food would run out before you got the money to buy more.: Never true    Within the past 12 months, the food you bought just didn't last and you didn't have money to get more.: Never true  Transportation Needs: No Transportation Needs (04/16/2024)   Received from [facility] - Transportation    In the past 12 months, has lack of transportation kept you from medical appointments or from getting medications?: No    Lack of Transportation (Non-Medical): No  Physical Activity: Not on file  Stress: Not on file  Social Connections: Not on file  Intimate Partner Violence: Not on file    Past Surgical History:  Procedure Laterality Date   ADENOIDECTOMY     BLADDER SURGERY     CHOLECYSTECTOMY     KNEE SURGERY      Left knee x 2    LEEP  10/2016   CIN 1   TONSILLECTOMY      Family History  Problem Relation Age of Onset   Asthma Mother    Hyperthyroidism Mother    Rheum arthritis Mother    Graves' disease Mother    Sjogren's syndrome Mother    Migraines Mother    Asthma Daughter    Migraines Daughter    Colon cancer Neg Hx     Allergies  Allergen Reactions   Gabapentin Other (See Comments)    syncope  Syncope   Codeine  Hives and Nausea And Vomiting   Other Itching   Morphine Hives and Itching    Current Outpatient Medications on File Prior to Visit  Medication Sig Dispense Refill   Asenapine Maleate 10 MG SUBL Place 10 mg under the tongue daily.     BD PEN NEEDLE MICRO U/F 32G X 6 MM MISC USE AS DIRECTED 5 TIMES DAILY WITH INSULIN  PEN 400 each 0   blood glucose meter kit and supplies KIT Dispense based on patient and insurance preference. Use up to four times daily as directed. (FOR ICD-9 250.00, 250.01). 1 each 0   celecoxib (CELEBREX) 200 MG capsule Take 200 mg by mouth 2 (two) times daily.     citalopram (CELEXA) 40 MG tablet Take 40 mg by mouth daily.     Continuous Glucose Sensor (DEXCOM G7 SENSOR) MISC by Does not apply route.     cyclobenzaprine  (FLEXERIL ) 10 MG tablet Take 1 tablet (10 mg total) by mouth 3 (three) times daily as needed. for muscle spasms or migraines 30 tablet 0   doxycycline  (VIBRA -TABS) 100 MG tablet Take 1 tablet (100 mg total) by mouth 2 (two) times daily. 20 tablet 0   EMGALITY 120 MG/ML SOAJ SMARTSIG:120 Milligram(s) SUB-Q Once a Month     gabapentin (NEURONTIN) 300 MG capsule Take 300 mg by mouth 3 (three) times daily.     glucose blood (ONETOUCH ULTRA) test strip USE TO CHECK BLOOD SUGAR UP TO FOUR TIMES DAILY AS DIRECTED 400 each 0   insulin  regular human CONCENTRATED (HUMULIN R ) 500 UNIT/ML KwikPen Inject 65 units before breakfast, 65 units before lunch and 60 units before dinner plus sliding scale as directed     Lancets (ONETOUCH DELICA PLUS  LANCET33G) MISC      levonorgestrel (MIRENA) 20 MCG/24HR IUD 1 each by Intrauterine route once.     MAGNESIUM  GLYCINATE PO Take 600 mg by mouth.     metFORMIN  (GLUCOPHAGE -XR) 500 MG 24 hr tablet Take 2,000 mg by mouth daily with breakfast.  metoprolol  tartrate (LOPRESSOR ) 25 MG tablet TAKE 1 TABLET BY MOUTH TWICE DAILY FOR HEART RATE. 180 tablet 2   pantoprazole  (PROTONIX ) 40 MG tablet TAKE 1 TABLET BY MOUTH TWICE DAILY BEFORE A MEAL FOR HEARTBURN 180 tablet 2   phentermine 15 MG capsule Take 15 mg by mouth daily.     pravastatin  (PRAVACHOL ) 80 MG tablet Take 80 mg by mouth. 5 times a week     Riboflavin (B-2-400 PO) Take 1 tablet by mouth daily.     topiramate  (TOPAMAX ) 100 MG tablet Take 100 mg by mouth 2 (two) times daily.     traMADol  (ULTRAM ) 50 MG tablet Take 50 mg by mouth.     Ubrogepant  (UBRELVY ) 50 MG TABS Take 1 tablet by mouth at migraine onset. May take 1 more tablet 2 hours later if migraine persists. 30 tablet 0   venlafaxine  XR (EFFEXOR -XR) 150 MG 24 hr capsule Take 2 capsules (300 mg total) by mouth daily with breakfast. 180 capsule 0   asenapine (SAPHRIS) 5 MG SUBL 24 hr tablet Place 5 mg under the tongue daily.     No current facility-administered medications on file prior to visit.    BP 106/64   Pulse 91   Temp (!) 97.2 F (36.2 C) (Temporal)   Ht 5' 4 (1.626 m)   Wt 188 lb (85.3 kg)   SpO2 97%   BMI 32.27 kg/m  Objective:   Physical Exam Constitutional:      General: She is not in acute distress. Cardiovascular:     Rate and Rhythm: Normal rate.  Pulmonary:     Effort: Pulmonary effort is normal.  Skin:    General: Skin is warm and dry.     Comments: 0.75 cm rounded, flat, flesh colored, slightly opened wound to left labia majora.  No drainage.  No surrounding erythema or tenderness.           Assessment & Plan:  Left genital labial abscess Assessment & Plan: Improved, nearly healed.  Continue doxycycline  100 mg twice daily to complete. We  discussed alternatives for hair removal outside of shaving.  Return precautions provided.         Kaelin Holford K Gotham Raden, NP

## 2024-05-10 ENCOUNTER — Ambulatory Visit (INDEPENDENT_AMBULATORY_CARE_PROVIDER_SITE_OTHER): Admitting: Podiatry

## 2024-05-10 DIAGNOSIS — L600 Ingrowing nail: Secondary | ICD-10-CM | POA: Diagnosis not present

## 2024-05-10 NOTE — Progress Notes (Signed)
 Subjective:  Patient ID: Christine Cook, female    DOB: Feb 09, 1976,  MRN: [id number]  Chief Complaint  Patient presents with   Ingrown Toenail    Right great toe ingrown nail     48 y.o. female presents with the above complaint.  Patient presents with right buttock progressive gotten worse worse with ambulation for pressure patient would like to discuss treatment options for pain scale 7 out of 10 dull aching nature she denies any other acute complaint she would like to have it removed   Review of Systems: Negative except as noted in the HPI. Denies N/V/F/Ch.  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 Use up to four times daily as directed. (FOR ICD-9 250.00, 250.01)., Disp: 1 each, Rfl: 0   celecoxib (CELEBREX) 200 MG capsule, Take 200 mg by mouth 2 (two) times daily., Disp: , Rfl:    citalopram (CELEXA) 40 MG tablet, Take 40 mg by  mouth daily., Disp: , Rfl:    Continuous Glucose Sensor (DEXCOM G7 SENSOR) MISC, by Does not apply route., Disp: , Rfl:    cyclobenzaprine  (FLEXERIL ) 10 MG tablet, Take 1 tablet (10 mg total) by mouth 3 (three) times daily as needed. for muscle spasms or migraines, Disp: 30 tablet, Rfl: 0   doxycycline  (VIBRA -TABS) 100 MG tablet, Take 1 tablet (100 mg total) by mouth 2 (two) times daily., Disp: 20 tablet, Rfl: 0   EMGALITY 120 MG/ML SOAJ, SMARTSIG:120 Milligram(s) SUB-Q Once a Month, Disp: , Rfl:    gabapentin (NEURONTIN) 300 MG capsule, Take 300 mg by mouth 3 (three) times daily., Disp: , Rfl:    glucose blood (ONETOUCH ULTRA) test strip, USE TO CHECK BLOOD SUGAR UP TO FOUR TIMES DAILY AS DIRECTED, Disp: 400 each, Rfl: 0   insulin  regular human CONCENTRATED (HUMULIN R ) 500 UNIT/ML KwikPen, Inject 65 units before breakfast, 65 units before lunch and 60 units before dinner plus sliding scale as directed, Disp: , Rfl:    Lancets (ONETOUCH DELICA PLUS LANCET33G) MISC, , Disp: , Rfl:    levonorgestrel (MIRENA) 20 MCG/24HR IUD, 1 each by Intrauterine route once., Disp: , Rfl:    MAGNESIUM  GLYCINATE PO, Take 600 mg by mouth., Disp: , Rfl:    metFORMIN  (GLUCOPHAGE -XR) 500  MG 24 hr tablet, Take 2,000 mg by mouth daily with breakfast., Disp: , Rfl:    metoprolol  tartrate (LOPRESSOR ) 25 MG tablet, TAKE 1 TABLET BY MOUTH TWICE DAILY FOR HEART RATE., Disp: 180 tablet, Rfl: 2   pantoprazole  (PROTONIX ) 40 MG tablet, TAKE 1 TABLET BY MOUTH TWICE DAILY BEFORE A MEAL FOR HEARTBURN, Disp: 180 tablet, Rfl: 2   phentermine 15 MG capsule, Take 15 mg by mouth daily., Disp: , Rfl:    pravastatin  (PRAVACHOL ) 80 MG tablet, Take 80 mg by mouth. 5 times a week, Disp: , Rfl:    Riboflavin (B-2-400 PO), Take 1 tablet by mouth daily., Disp: , Rfl:    topiramate  (TOPAMAX ) 100 MG tablet, Take 100 mg by mouth 2 (two) times daily., Disp: , Rfl:    traMADol  (ULTRAM ) 50 MG tablet, Take 50 mg by mouth., Disp: , Rfl:    Ubrogepant   (UBRELVY ) 50 MG TABS, Take 1 tablet by mouth at migraine onset. May take 1 more tablet 2 hours later if migraine persists., Disp: 30 tablet, Rfl: 0   venlafaxine  XR (EFFEXOR -XR) 150 MG 24 hr capsule, Take 2 capsules (300 mg total) by mouth daily with breakfast., Disp: 180 capsule, Rfl: 0  Social History   Tobacco Use  Smoking Status Former   Current packs/day: 0.00   Average packs/day: 0.3 packs/day for 2.0 years (0.5 ttl pk-yrs)   Types: Cigarettes   Start date: 2015   Quit date: 2017   Years since quitting: 8.6  Smokeless Tobacco Never    Allergies  Allergen Reactions   Gabapentin Other (See Comments)    syncope  Syncope   Codeine  Hives and Nausea And Vomiting   Other Itching   Morphine Hives and Itching   Objective:  There were no vitals filed for this visit. There is no height or weight on file to calculate BMI. Constitutional Well developed. Well nourished.  Vascular Dorsalis pedis pulses palpable bilaterally. Posterior tibial pulses palpable bilaterally. Capillary refill normal to all digits.  No cyanosis or clubbing noted. Pedal hair growth normal.  Neurologic Normal speech. Oriented to person, place, and time. Epicritic sensation to light touch grossly present bilaterally.  Dermatologic Painful ingrowing nail at medial nail borders of the hallux nail right. No other open wounds. No skin lesions.  Orthopedic: Normal joint ROM without pain or crepitus bilaterally. No visible deformities. No bony tenderness.   Radiographs: None Assessment:   1. Ingrown toenail of right foot    Plan:  Patient was evaluated and treated and all questions answered.  Ingrown Nail, right -Patient elects to proceed with minor surgery to remove ingrown toenail removal today. Consent reviewed and signed by patient. -Ingrown nail excised. See procedure note. -Educated on post-procedure care including soaking. Written instructions provided and reviewed. -Patient to follow up in 2  weeks for nail check.  Procedure: Excision of Ingrown Toenail Location: Right 1st toe medial nail borders. Anesthesia: Lidocaine 1% plain; 1.5 mL and Marcaine 0.5% plain; 1.5 mL, digital block. Skin Prep: Betadine. Dressing: Silvadene; telfa; dry, sterile, compression dressing. Technique: Following skin prep, the toe was exsanguinated and a tourniquet was secured at the base of the toe. The affected nail border was freed, split with a nail splitter, and excised. Chemical matrixectomy was then performed with phenol and irrigated out with alcohol. The tourniquet was then removed and sterile dressing applied. Disposition: Patient tolerated procedure well. Patient to return in 2 weeks for follow-up.   No follow-ups on file.

## 2024-05-24 ENCOUNTER — Other Ambulatory Visit: Payer: Self-pay | Admitting: Primary Care

## 2024-05-24 DIAGNOSIS — Z794 Long term (current) use of insulin: Secondary | ICD-10-CM

## 2024-05-28 ENCOUNTER — Ambulatory Visit
Admission: EM | Admit: 2024-05-28 | Discharge: 2024-05-28 | Disposition: A | Discharge status: Home / Self Care | Attending: Physician Assistant | Admitting: Physician Assistant

## 2024-05-28 ENCOUNTER — Telehealth: Payer: Self-pay

## 2024-05-28 DIAGNOSIS — R051 Acute cough: Secondary | ICD-10-CM

## 2024-05-28 DIAGNOSIS — J019 Acute sinusitis, unspecified: Secondary | ICD-10-CM | POA: Diagnosis not present

## 2024-05-28 DIAGNOSIS — R0981 Nasal congestion: Secondary | ICD-10-CM

## 2024-05-28 MED ORDER — PROMETHAZINE-DM 6.25-15 MG/5ML PO SYRP: 5.0000 mL | ORAL_SOLUTION | Freq: Four times a day (QID) | ORAL | 0 refills | Status: DC | PRN

## 2024-05-28 MED ORDER — AMOXICILLIN-POT CLAVULANATE 875-125 MG PO TABS: 1.0000 | ORAL_TABLET | Freq: Two times a day (BID) | ORAL | 0 refills | Status: AC

## 2024-05-28 NOTE — Discharge Instructions (Addendum)
-  Continue nasal spray -Start antibiotics for sinus infection and cough meds as needed -Return if fever (>101 degrees), worsening cough, chest pain, or shortness of breath

## 2024-05-28 NOTE — ED Provider Notes (Signed)
 [facility]    CSN: [id number] Arrival date & time: 05/28/24  1002      History   Chief Complaint Chief Complaint  Patient presents with   Sinus Problem    HPI Christine HEMBERGER is a 48 y.o. female presenting for sinus pressure, nasal congestion and fatigue for the past 2 weeks.  She reports that she recently started to cough and cough has been productive.  However, she is still most bothered by the nasal congestion and sinus pressure.  No associated fever.  Has had a mild sore throat but no ear pain, chest pain or shortness of breath.  Has been using Nasacort and taking OTC cough medications without relief.  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   Sleep apnea with hypersomnolence    CPAP study 11-28-09,  10-02-2009 AHI was 16.5, pressure to    Tachycardia     Patient Active Problem List   Diagnosis Date Noted   Left genital labial abscess 04/18/2024   Migraine without aura and without status migrainosus, not intractable 11/04/2023   Localized skin mass, lump, or swelling 11/04/2023   GAD (generalized anxiety disorder) 11/05/2022   Type 2 diabetes mellitus with diabetic neuropathy (HCC) 08/05/2022   Chronic fatigue 12/11/2021   Preventative health care 12/11/2021   Type 2 diabetes mellitus with hyperglycemia, with long-term current use of insulin  (HCC) 01/22/2021   Chronic pancreatitis (HCC) 01/13/2021   Screening for cervical cancer  09/11/2019   Arthralgia of right temporomandibular joint 02/15/2019   Plantar fasciitis 01/02/2018   Hypokalemia 11/29/2017   Cervical dysplasia 12/01/2016   Hyperlipidemia 08/05/2016   Tachycardia 08/05/2016   OSA (obstructive sleep apnea) 07/12/2016   Narcolepsy 07/12/2016   Vitamin B12 deficiency 07/12/2016   Vitamin D  deficiency 07/12/2016   Depression 07/11/2016   Impulse control disorder (gambling) 07/11/2016   HTN (hypertension) 07/09/2016   GERD (gastroesophageal reflux disease) 01/21/2009    Past Surgical History:  Procedure Laterality Date   ADENOIDECTOMY     BLADDER SURGERY     CHOLECYSTECTOMY     KNEE SURGERY     Left knee x 2    LEEP  10/2016   CIN 1   TONSILLECTOMY      OB History   No obstetric history on file.      Home Medications    Prior to Admission medications   Medication Sig Start Date End Date Taking? Authorizing Provider  amoxicillin -clavulanate (AUGMENTIN ) 875-125 MG tablet Take 1 tablet by mouth every 12 (twelve) hours for 7 days. 05/28/24 06/04/24 Yes Arvis Huxley B, PA-C  promethazine -dextromethorphan (PROMETHAZINE -DM) 6.25-15 MG/5ML syrup Take 5 mLs by mouth 4 (four) times daily as needed. 05/28/24  Yes Arvis Huxley B, PA-C  asenapine (SAPHRIS) 5 MG SUBL 24 hr tablet Place 5 mg under the tongue daily.    [provider]  Asenapine Maleate 10 MG SUBL  Place 10 mg under the tongue daily.    [provider]  BD PEN NEEDLE MICRO ULTRAFINE 32G X 6 MM MISC AS DIRECTED 5  TIMES  DAILY  WITH  INSULIN   PEN 05/25/24   Gretta Comer POUR, NP  blood glucose meter kit and supplies KIT Dispense based on patient and insurance preference. Use up to four times daily as directed. (FOR ICD-9 250.00, 250.01). 10/28/20   Clark, Katherine K, NP  celecoxib (CELEBREX) 200 MG capsule Take 200 mg by mouth 2 (two) times daily. 08/24/23   [provider]  citalopram (CELEXA) 40 MG tablet Take 40 mg by mouth daily. 02/08/23   [provider]   Continuous Glucose Sensor (DEXCOM G7 SENSOR) MISC by Does not apply route.    [provider]  cyclobenzaprine  (FLEXERIL ) 10 MG tablet Take 1 tablet (10 mg total) by mouth 3 (three) times daily as needed. for muscle spasms or migraines 04/05/24   Clark, Katherine K, NP  doxycycline  (VIBRA -TABS) 100 MG tablet Take 1 tablet (100 mg total) by mouth 2 (two) times daily. 04/13/24   Bedsole, Amy E, MD  EMGALITY 120 MG/ML SOAJ SMARTSIG:120 Milligram(s) SUB-Q Once a Month    [provider]  gabapentin (NEURONTIN) 300 MG capsule Take 300 mg by mouth 3 (three) times daily.    [provider]  glucose blood (ONETOUCH ULTRA) test strip USE TO CHECK BLOOD SUGAR UP TO FOUR TIMES DAILY AS DIRECTED 12/15/22   Clark, Katherine K, NP  insulin  regular human CONCENTRATED (HUMULIN R ) 500 UNIT/ML KwikPen Inject 65 units before breakfast, 65 units before lunch and 60 units before dinner plus sliding scale as directed 01/27/24   [provider]  Lancets [facility] CATHRYNE CORALYN LEGIONS) MISC  10/29/20   [provider]  levonorgestrel (MIRENA) 20 MCG/24HR IUD 1 each by Intrauterine route once. 01/16/14   Murphy Channel, FNP  MAGNESIUM  GLYCINATE PO Take 600 mg by mouth.    [provider]  metFORMIN  (GLUCOPHAGE -XR) 500 MG 24 hr tablet Take 2,000 mg by mouth daily with breakfast.    [provider]  metoprolol  tartrate (LOPRESSOR ) 25 MG tablet TAKE 1 TABLET BY MOUTH TWICE DAILY FOR HEART RATE. 05/22/23   Clark, Katherine K, NP  pantoprazole  (PROTONIX ) 40 MG tablet TAKE 1 TABLET BY MOUTH TWICE DAILY BEFORE A MEAL FOR HEARTBURN 06/12/23   Clark, Katherine K, NP  phentermine 15 MG capsule Take 15 mg by mouth daily. 04/12/24 05/12/24  [provider]  pravastatin  (PRAVACHOL ) 80 MG tablet Take 80 mg by mouth. 5 times a week    [provider]  Riboflavin (B-2-400 PO) Take 1 tablet by mouth daily.    [provider]  topiramate  (TOPAMAX ) 100 MG tablet  Take 100 mg by mouth 2 (two) times daily.    [provider]  traMADol  (ULTRAM ) 50 MG tablet Take 50 mg by mouth. 01/01/24   [provider]  Ubrogepant  (UBRELVY ) 50 MG TABS Take 1 tablet by mouth at migraine onset. May take 1 more tablet 2 hours later if migraine persists. 02/16/24   Gretta Comer POUR, NP  venlafaxine  XR (EFFEXOR -XR) 150 MG 24 hr capsule Take 2 capsules (300 mg total) by mouth daily with breakfast. 08/13/22   Gretta Comer POUR, NP    Family History Family History  Problem Relation Age of Onset   Asthma Mother    Hyperthyroidism Mother    Rheum arthritis Mother    Yvone' disease Mother  Sjogren's syndrome Mother    Migraines Mother    Asthma Daughter    Migraines Daughter    Colon cancer Neg Hx     Social History Social History   Tobacco Use   Smoking status: Former    Current packs/day: 0.00    Average packs/day: 0.3 packs/day for 2.0 years (0.5 ttl pk-yrs)    Types: Cigarettes    Start date: 2015    Quit date: 2017    Years since quitting: 8.6   Smokeless tobacco: Never  Vaping Use   Vaping status: Never Used  Substance Use Topics   Alcohol use: No   Drug use: No     Allergies   Gabapentin, Codeine , Other, and Morphine   Review of Systems Review of Systems  Constitutional:  Positive for fatigue. Negative for chills, diaphoresis and fever.  HENT:  Positive for congestion, rhinorrhea, sinus pressure, sinus pain and sore throat. Negative for ear pain.   Respiratory:  Positive for cough. Negative for shortness of breath.   Cardiovascular:  Negative for chest pain.  Gastrointestinal:  Negative for abdominal pain, nausea and vomiting.  Musculoskeletal:  Negative for arthralgias and myalgias.  Skin:  Negative for rash.  Neurological:  Negative for weakness and headaches.  Hematological:  Negative for adenopathy.     Physical Exam Triage Vital Signs ED Triage Vitals  Encounter Vitals Group     BP      Girls Systolic BP  Percentile      Girls Diastolic BP Percentile      Boys Systolic BP Percentile      Boys Diastolic BP Percentile      Pulse      Resp      Temp      Temp src      SpO2      Weight      Height      Head Circumference      Peak Flow      Pain Score      Pain Loc      Pain Education      Exclude from Growth Chart    No data found.  Updated Vital Signs BP 116/80 (BP Location: Right Arm)   Pulse 88   Temp 99 F (37.2 C) (Oral)   Resp 16   Ht 5' 4 (1.626 m)   Wt 190 lb (86.2 kg)   SpO2 95%   BMI 32.61 kg/m   Physical Exam Vitals and nursing note reviewed.  Constitutional:      General: She is not in acute distress.    Appearance: Normal appearance. She is not ill-appearing or toxic-appearing.  HENT:     Head: Normocephalic and atraumatic.     Right Ear: Tympanic membrane, ear canal and external ear normal.     Left Ear: Tympanic membrane, ear canal and external ear normal.     Nose: Congestion present.     Mouth/Throat:     Mouth: Mucous membranes are moist.     Pharynx: Oropharynx is clear.  Eyes:     General: No scleral icterus.       Right eye: No discharge.        Left eye: No discharge.     Conjunctiva/sclera: Conjunctivae normal.  Cardiovascular:     Rate and Rhythm: Normal rate and regular rhythm.     Heart sounds: Normal heart sounds.  Pulmonary:     Effort: Pulmonary effort is normal. No respiratory distress.  Breath sounds: Normal breath sounds.  Musculoskeletal:     Cervical back: Neck supple.  Skin:    General: Skin is dry.  Neurological:     General: No focal deficit present.     Mental Status: She is alert. Mental status is at baseline.     Motor: No weakness.     Gait: Gait normal.  Psychiatric:        Mood and Affect: Mood normal.        Behavior: Behavior normal.      UC Treatments / Results  Labs (all labs ordered are listed, but only abnormal results are displayed) Labs Reviewed - No data to display  EKG   Radiology No  results found.  Procedures Procedures (including critical care time)  Medications Ordered in UC Medications - No data to display  Initial Impression / Assessment and Plan / UC Course  I have reviewed the triage vital signs and the nursing notes.  Pertinent labs & imaging results that were available during my care of the patient were reviewed by me and considered in my medical decision making (see chart for details).   48 year old female presents for 2-week history of nasal congestion and sinus pressure.  New onset cough in the past couple days.  No fever, chest pain or shortness of breath.  Presentation consistent with acute sinusitis.  Will treat at this time with Augmentin  and Promethazine  DM.  Continue Nasacort, nasal saline, rest and fluids.  Reviewed return precautions.   Final Clinical Impressions(s) / UC Diagnoses   Final diagnoses:  Acute sinusitis, recurrence not specified, unspecified location  Nasal congestion  Acute cough     Discharge Instructions      -Continue nasal spray -Start antibiotics for sinus infection and cough meds as needed -Return if fever (>101 degrees), worsening cough, chest pain, or shortness of breath     ED Prescriptions     Medication Sig Dispense Auth. Provider   amoxicillin -clavulanate (AUGMENTIN ) 875-125 MG tablet Take 1 tablet by mouth every 12 (twelve) hours for 7 days. 14 tablet Arvis Huxley B, PA-C   promethazine -dextromethorphan (PROMETHAZINE -DM) 6.25-15 MG/5ML syrup Take 5 mLs by mouth 4 (four) times daily as needed. 118 mL Arvis Huxley NOVAK, PA-C      PDMP not reviewed this encounter.   Arvis Huxley NOVAK, PA-C 05/28/24 1103

## 2024-05-28 NOTE — ED Triage Notes (Signed)
 Pt c/o sinus pressure,cough & congestion x2 wks. Has tried OTC meds w/o relief.

## 2024-05-28 NOTE — Telephone Encounter (Signed)
 This is an inappropriate transfer or care.  I decline a transfer of care.  I think the the patient is not requesting a transfer of care, and she just needs an appointment on Wednesday, and I am happy to see her.   Copied from CRM #[id number]. Topic: Appointments - Transfer of Care >> May 28, 2024  8:59 AM [name] wrote: Pt is requesting to transfer FROM: Christine Cook Pt is requesting to transfer TO: Christine Cook  Reason for requested transfer: Needed a visit this Wednesday  It is the responsibility of the team the patient would like to transfer to (Dr. Watt) to reach out to the patient if for any reason this transfer is not acceptable.

## 2024-05-28 NOTE — Telephone Encounter (Signed)
 Copied from CRM #[id number]. Topic: Appointments - Transfer of Care >> May 28, 2024  8:59 AM [name] wrote: Pt is requesting to transfer FROM: Christine Cook Pt is requesting to transfer TO: Jacques Copland  Reason for requested transfer: Needed a visit this Wednesday  It is the responsibility of the team the patient would like to transfer to (Dr. Watt) to reach out to the patient if for any reason this transfer is not acceptable.

## 2024-05-30 ENCOUNTER — Ambulatory Visit: Admitting: Family Medicine

## 2024-06-20 ENCOUNTER — Ambulatory Visit: Admitting: Primary Care

## 2024-06-20 ENCOUNTER — Encounter: Payer: Self-pay | Admitting: Primary Care

## 2024-06-20 VITALS — BP 116/78 | HR 85 | Temp 97.4°F | Ht 64.0 in | Wt 189.0 lb

## 2024-06-20 DIAGNOSIS — J302 Other seasonal allergic rhinitis: Secondary | ICD-10-CM | POA: Diagnosis not present

## 2024-06-20 DIAGNOSIS — R5382 Chronic fatigue, unspecified: Secondary | ICD-10-CM | POA: Diagnosis not present

## 2024-06-20 DIAGNOSIS — Z23 Encounter for immunization: Secondary | ICD-10-CM

## 2024-06-20 DIAGNOSIS — E559 Vitamin D deficiency, unspecified: Secondary | ICD-10-CM

## 2024-06-20 DIAGNOSIS — M545 Low back pain, unspecified: Secondary | ICD-10-CM

## 2024-06-20 DIAGNOSIS — G43019 Migraine without aura, intractable, without status migrainosus: Secondary | ICD-10-CM

## 2024-06-20 DIAGNOSIS — E538 Deficiency of other specified B group vitamins: Secondary | ICD-10-CM

## 2024-06-20 LAB — VITAMIN D 25 HYDROXY (VIT D DEFICIENCY, FRACTURES): VITD: 30.95 ng/mL (ref 30.00–100.00)

## 2024-06-20 LAB — CBC
HCT: 42.7 % (ref 36.0–46.0)
Hemoglobin: 13.5 g/dL (ref 12.0–15.0)
MCHC: 31.6 g/dL (ref 30.0–36.0)
MCV: 87.9 fl (ref 78.0–100.0)
Platelets: 273 K/uL (ref 150.0–400.0)
RBC: 4.86 Mil/uL (ref 3.87–5.11)
RDW: 15.2 % (ref 11.5–15.5)
WBC: 7.9 K/uL (ref 4.0–10.5)

## 2024-06-20 LAB — VITAMIN B12: Vitamin B-12: 947 pg/mL — ABNORMAL HIGH (ref 211–911)

## 2024-06-20 LAB — FERRITIN: Ferritin: 17.7 ng/mL (ref 10.0–291.0)

## 2024-06-20 MED ORDER — CYCLOBENZAPRINE HCL 10 MG PO TABS: 10.0000 mg | ORAL_TABLET | Freq: Three times a day (TID) | ORAL | 0 refills | Status: AC | PRN

## 2024-06-20 MED ORDER — KETOROLAC TROMETHAMINE 60 MG/2ML IM SOLN
60.0000 mg | Freq: Once | INTRAMUSCULAR | Status: AC
  Administered 2024-06-20: 60 mg via INTRAMUSCULAR

## 2024-06-20 NOTE — Progress Notes (Signed)
 Subjective:    Patient ID: Christine Cook, female    DOB: 09-17-1976, 48 y.o.   MRN: [id number]  Christine Cook is a very pleasant 48 y.o. female with a history of uncontrolled type 2 diabetes, chronic pancreatitis, hypertension, vitamin D  and B12 deficiency, narcolepsy, GAD who presents today to discuss fatigue.   1) Fatigue: History of chronic fatigue, over the last 3-4 weeks she's felt more consistent fatigue. Evaluated by neurology in May 2025, vitamin B12 level was low so she was given 1000 mcg IM that day and advised to take 1000 mcg daily OTC. She was also told that vitamin D  was very low so she was prescribed 50,000 international units once weekly over 8 weeks and the to move to 1000 international units  OTC.   She denies vaginal and rectal bleeding, new medications. She is compliant to B12 and D vitamins. Evaluated by pulmonology previously (1 year) completed a sleep study and was told that she did not need CPAP.   2) Allergic Rhinitis: Acute on chronic. Over the last 6 weeks she's noticed increased and persistent symptoms of nasal drainage and congestion. She's using Nasacort and Allergra without improvement.   3) Back Pain: Acute onset of left lower back pain that began about 2 weeks ago. Her pain does not radiate and she denies numbness/tingling. She denies injury, heavy lifting. She's taken Tylenol , Advil  (800 mg twice daily), inconsistently and used ice which takes the edge off without resolve. She is already managed on Celebrex and is taking twice daily  She sits most of her workday, but is active sometimes. She denies loss of bowel/bladder control.   Wt Readings from Last 3 Encounters:  06/20/24 189 lb (85.7 kg)  05/28/24 190 lb (86.2 kg)  04/19/24 188 lb (85.3 kg)      Review of Systems  Constitutional:  Positive for fatigue.  HENT:  Positive for congestion and postnasal drip.   Musculoskeletal:  Positive for arthralgias and back pain.  Allergic/Immunologic: Positive  for environmental allergies.  Neurological:  Negative for numbness.         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   Sleep apnea with hypersomnolence    CPAP study 11-28-09,  10-02-2009 AHI was 16.5, pressure to    Tachycardia     Social History   Socioeconomic History   Marital status: Single    Spouse name: Not on file   Number of children: 1   Years of education: College   Highest education level: Not on file  Occupational History   Occupation: Support specialist    Employer: [employer]  Tobacco Use   Smoking status: Former    Current packs/day: 0.00    Average packs/day: 0.3 packs/day for 2.0 years (0.5 ttl pk-yrs)    Types: Cigarettes    Start date: 2015    Quit date: 2017  Years since quitting: 8.7   Smokeless tobacco: Never  Vaping Use   Vaping status: Never Used  Substance and Sexual Activity   Alcohol use: No   Drug use: No   Sexual activity: Yes    Birth control/protection: I.U.D.  Other Topics Concern   Not on file  Social History Narrative   Single.   One daughter.   Works at [employer].   Enjoys relaxing, spending time with family.   Social Drivers of Corporate investment banker Strain: Low Risk  (04/16/2024)   Received from [facility]   Overall Financial Resource Strain (CARDIA)    Difficulty of Paying Living Expenses: Not hard at all  Food Insecurity: No Food Insecurity (04/16/2024)   Received from  [facility]   Hunger Vital Sign    Within the past 12 months, you worried that your food would run out before you got the money to buy more.: Never true    Within the past 12 months, the food you bought just didn't last and you didn't have money to get more.: Never true  Transportation Needs: No Transportation Needs (04/16/2024)   Received from [facility] - Transportation    In the past 12 months, has lack of transportation kept you from medical appointments or from getting medications?: No    Lack of Transportation (Non-Medical): No  Physical Activity: Not on file  Stress: Not on file  Social Connections: Not on file  Intimate Partner Violence: Not on file    Past Surgical History:  Procedure Laterality Date   ADENOIDECTOMY     BLADDER SURGERY     CHOLECYSTECTOMY     KNEE SURGERY     Left knee x 2    LEEP  10/2016   CIN 1   TONSILLECTOMY      Family History  Problem Relation Age of Onset   Asthma Mother    Hyperthyroidism Mother    Rheum arthritis Mother    Graves' disease Mother    Sjogren's syndrome Mother    Migraines Mother    Asthma Daughter    Migraines Daughter    Colon cancer Neg Hx     Allergies  Allergen Reactions   Gabapentin Other (See Comments)    syncope  Syncope   Codeine  Hives and Nausea And Vomiting   Other Itching   Morphine Hives and Itching    Current Outpatient Medications on File Prior to Visit  Medication Sig Dispense Refill   asenapine (SAPHRIS) 5 MG SUBL 24 hr tablet Place 5 mg under the tongue daily.     Asenapine Maleate 10 MG SUBL Place 10 mg under the tongue daily.     BD PEN NEEDLE MICRO ULTRAFINE 32G X 6 MM MISC AS DIRECTED 5  TIMES  DAILY  WITH  INSULIN   PEN 400 each 0   blood glucose meter kit and supplies KIT Dispense based on patient and insurance preference. Use up to four times daily as directed. (FOR ICD-9 250.00, 250.01). 1 each 0   celecoxib (CELEBREX) 200 MG capsule Take  200 mg by mouth 2 (two) times daily.     Cholecalciferol  (D3) 25 MCG (1000 UT) capsule Take 1,000 Units by mouth daily.     citalopram (CELEXA) 40 MG tablet Take 40 mg by mouth daily.     Continuous Glucose Sensor (DEXCOM G7 SENSOR) MISC by Does not apply route.     cyanocobalamin  (VITAMIN B12) 1000 MCG  tablet Take 1,000 mcg by mouth daily.     EMGALITY 120 MG/ML SOAJ SMARTSIG:120 Milligram(s) SUB-Q Once a Month     gabapentin (NEURONTIN) 300 MG capsule Take 300 mg by mouth 3 (three) times daily.     glucose blood (ONETOUCH ULTRA) test strip USE TO CHECK BLOOD SUGAR UP TO FOUR TIMES DAILY AS DIRECTED 400 each 0   insulin  regular human CONCENTRATED (HUMULIN R ) 500 UNIT/ML KwikPen Inject 65 units before breakfast, 65 units before lunch and 60 units before dinner plus sliding scale as directed     Lancets (ONETOUCH DELICA PLUS LANCET33G) MISC      levonorgestrel (MIRENA) 20 MCG/24HR IUD 1 each by Intrauterine route once.     MAGNESIUM  GLYCINATE PO Take 600 mg by mouth.     metFORMIN  (GLUCOPHAGE -XR) 500 MG 24 hr tablet Take 2,000 mg by mouth daily with breakfast.     metoprolol  tartrate (LOPRESSOR ) 25 MG tablet TAKE 1 TABLET BY MOUTH TWICE DAILY FOR HEART RATE. 180 tablet 2   pantoprazole  (PROTONIX ) 40 MG tablet TAKE 1 TABLET BY MOUTH TWICE DAILY BEFORE A MEAL FOR HEARTBURN 180 tablet 2   phentermine 15 MG capsule Take 15 mg by mouth daily.     pravastatin  (PRAVACHOL ) 80 MG tablet Take 80 mg by mouth. 5 times a week     Riboflavin (B-2-400 PO) Take 1 tablet by mouth daily.     topiramate  (TOPAMAX ) 100 MG tablet Take 100 mg by mouth 2 (two) times daily.     Ubrogepant  (UBRELVY ) 50 MG TABS Take 1 tablet by mouth at migraine onset. May take 1 more tablet 2 hours later if migraine persists. 30 tablet 0   venlafaxine  XR (EFFEXOR -XR) 150 MG 24 hr capsule Take 2 capsules (300 mg total) by mouth daily with breakfast. 180 capsule 0   traMADol  (ULTRAM ) 50 MG tablet Take 50 mg by mouth. (Patient not taking:  Reported on 06/20/2024)     No current facility-administered medications on file prior to visit.    BP 116/78   Pulse 85   Temp (!) 97.4 F (36.3 C) (Temporal)   Ht 5' 4 (1.626 m)   Wt 189 lb (85.7 kg)   SpO2 98%   BMI 32.44 kg/m  Objective:   Physical Exam Cardiovascular:     Rate and Rhythm: Normal rate and regular rhythm.  Pulmonary:     Effort: Pulmonary effort is normal.     Breath sounds: Normal breath sounds.  Musculoskeletal:     Cervical back: Neck supple.     Lumbar back: No tenderness or bony tenderness. Normal range of motion. Negative right straight leg raise test and negative left straight leg raise test.       Back:  Skin:    General: Skin is warm and dry.  Neurological:     Mental Status: She is alert and oriented to person, place, and time.  Psychiatric:        Mood and Affect: Mood normal.     Physical Exam        Assessment & Plan:  Encounter for immunization -     Flu vaccine trivalent PF, 6mos and older(Flulaval,Afluria,Fluarix,Fluzone)  Vitamin B12 deficiency -     Vitamin B12  Vitamin D  deficiency -     VITAMIN D  25 Hydroxy (Vit-D Deficiency, Fractures)  Seasonal allergies Assessment & Plan: Uncontrolled.  Try OTC Xyzal or Zyrtec to help with nasal drainage and congestion.  Stop Allegra.  Consider Singulair if no improvement.  She will update.  I evaluated patient, was consulted regarding treatment, and agree with assessment and plan per Kristin Rudd, MSN, FNP student.   Mallie Gaskins, NP-C    Chronic fatigue Assessment & Plan: Acute on chronic.  Differentials include vitamin deficiencies, anemia. Also consider OSA although may be less likely.   Labs ordered: Vit D, Vit B12, CBC, and Ferritin, pending results.   Continue taking Vitamin B12 and Vitamin D  supplements.   Await results.   I evaluated patient, was consulted regarding treatment, and agree with assessment and plan per Kristin Rudd, MSN, FNP student.    Mallie Gaskins, NP-C    Orders: -     CBC -     Ferritin  Acute left-sided low back pain without sciatica Assessment & Plan: HPI most consistent with lumbar strain vs arthritis.   Will treat with Toradol  60 mg IM today.   Start taking Tylenol  Extra Strength 1000 mg three times a day for 2 weeks for lower back pain.  Consider stretching and exercise to help.  She will update.   I evaluated patient, was consulted regarding treatment, and agree with assessment and plan per Kristin Rudd, MSN, FNP student.   Mallie Gaskins, NP-C   Orders: -     Ketorolac  Tromethamine   Intractable migraine without aura and without status migrainosus -     Cyclobenzaprine  HCl; Take 1 tablet (10 mg total) by mouth 3 (three) times daily as needed. for muscle spasms or migraines  Dispense: 30 tablet; Refill: 0    Assessment and Plan Assessment & Plan         Comer MARLA Gaskins, NP     History of Present Illness

## 2024-06-20 NOTE — Assessment & Plan Note (Addendum)
 Uncontrolled.  Try OTC Xyzal or Zyrtec to help with nasal drainage and congestion.  Stop Allegra.  Consider Singulair if no improvement.   She will update.  I evaluated patient, was consulted regarding treatment, and agree with assessment and plan per Yamila Cragin, MSN, FNP student.   Mallie Gaskins, NP-C

## 2024-06-20 NOTE — Progress Notes (Signed)
   Acute Office Visit  Subjective:     Patient ID: Christine Cook, female    DOB: Feb 15, 1976, 48 y.o.   MRN: [id number]  Chief Complaint  Patient presents with   Fatigue    For a few weeks has noticed she is more tired and weak than usual   Requesting flexeril  refilled     HPI Christine Cook is a 48 year old female with a history of hypertension, vitamin D  deficiency, and vitamin B12 deficiency, and anxiety coming in today for evaluation of fatigue.   Started feeling more fatigued 3-4 weeks. Denies any vaginal or rectal bleeding. Denies starting any new medication and has been consistent with taking Vitamin B12 and Vitamin D  supplements. She was evaluated by Neurology approximately who checked B12 and was  given an injection of B 12 and told to take 1000 mcg daily. Vitamin D  was also checked and instructed to take 50,000 units of vitamin D  for 8 weeks and then take a 1000 u daily.  Seasonal Allergies: Reports nasal drainage and congestion x 6 weeks. Has tried Allegra and Nasocort with no relief.  Lower Left Sided Back Pain: Started 2 weeks ago, pain located in the lower back. Denies any trauma or injury. Denies any numbness or tingling or bowel/bladder incontinence. Movement such as bending or turning makes it worse. Has tried ice, Tylenol , Advil , and rest to help and states that it takes the edge off.    Review of Systems  Constitutional: Negative.   HENT: Negative.    Respiratory: Negative.    Cardiovascular: Negative.   Musculoskeletal:  Positive for myalgias.  Neurological: Negative.       Objective:    Ht 5' 4 (1.626 m)   Wt 85.7 kg   BMI 32.44 kg/m    Physical Exam  No results found for any visits on 06/20/24.      Assessment & Plan:   Problem List Items Addressed This Visit   None   No orders of the defined types were placed in this encounter.   No follow-ups on file.  Nylen Creque, RN

## 2024-06-20 NOTE — Assessment & Plan Note (Addendum)
 Acute on chronic.  Differentials include vitamin deficiencies, anemia. Also consider OSA although may be less likely.   Labs ordered: Vit D, Vit B12, CBC, and Ferritin, pending results.   Continue taking Vitamin B12 and Vitamin D  supplements.   Await results.   I evaluated patient, was consulted regarding treatment, and agree with assessment and plan per Blayde Bacigalupi, MSN, FNP student.   Mallie Gaskins, NP-C

## 2024-06-20 NOTE — Patient Instructions (Signed)
 Stop by the lab prior to leaving today. I will notify you of your results once received.   Start taking Tylenol  Extra Strength 1000 mg three times a day for 2 weeks for lower back pain.  Consider stretching and exercise to help.  Try OTC zyrtec or Xyzal to help with nasal drainage and congestion.  Follow up as needed.

## 2024-06-20 NOTE — Assessment & Plan Note (Addendum)
 HPI most consistent with lumbar strain vs arthritis.   Will treat with Toradol  60 mg IM today.   Start taking Tylenol  Extra Strength 1000 mg three times a day for 2 weeks for lower back pain.  Consider stretching and exercise to help.  She will update.   I evaluated patient, was consulted regarding treatment, and agree with assessment and plan per Lavonn Maxcy, MSN, FNP student.   Mallie Gaskins, NP-C

## 2024-06-21 ENCOUNTER — Ambulatory Visit: Payer: Self-pay | Admitting: Primary Care

## 2024-06-22 ENCOUNTER — Other Ambulatory Visit: Payer: Self-pay | Admitting: Primary Care

## 2024-06-22 DIAGNOSIS — K219 Gastro-esophageal reflux disease without esophagitis: Secondary | ICD-10-CM

## 2024-07-15 ENCOUNTER — Other Ambulatory Visit: Payer: Self-pay | Admitting: Primary Care

## 2024-07-15 DIAGNOSIS — R000 Tachycardia, unspecified: Secondary | ICD-10-CM

## 2024-07-15 DIAGNOSIS — I10 Essential (primary) hypertension: Secondary | ICD-10-CM

## 2024-07-17 ENCOUNTER — Other Ambulatory Visit: Payer: Self-pay | Admitting: Primary Care

## 2024-07-17 DIAGNOSIS — R000 Tachycardia, unspecified: Secondary | ICD-10-CM

## 2024-07-17 DIAGNOSIS — I10 Essential (primary) hypertension: Secondary | ICD-10-CM

## 2024-08-01 ENCOUNTER — Other Ambulatory Visit: Payer: Self-pay | Admitting: Primary Care

## 2024-08-01 DIAGNOSIS — G43019 Migraine without aura, intractable, without status migrainosus: Secondary | ICD-10-CM

## 2024-09-19 ENCOUNTER — Other Ambulatory Visit: Payer: Self-pay | Admitting: Primary Care

## 2024-09-19 DIAGNOSIS — G43019 Migraine without aura, intractable, without status migrainosus: Secondary | ICD-10-CM
